# Patient Record
Sex: Female | Born: 1990 | Race: White | Hispanic: No | Smoking: Current every day smoker
Health system: Southern US, Community
[De-identification: ages and names within clinical notes are randomized; demographics above are authoritative.]

## PROBLEM LIST (undated history)

## (undated) DIAGNOSIS — F411 Generalized anxiety disorder: Secondary | ICD-10-CM

## (undated) DIAGNOSIS — F1911 Other psychoactive substance abuse, in remission: Secondary | ICD-10-CM

## (undated) DIAGNOSIS — F4310 Post-traumatic stress disorder, unspecified: Secondary | ICD-10-CM

## (undated) DIAGNOSIS — J40 Bronchitis, not specified as acute or chronic: Secondary | ICD-10-CM

## (undated) DIAGNOSIS — K279 Peptic ulcer, site unspecified, unspecified as acute or chronic, without hemorrhage or perforation: Secondary | ICD-10-CM

## (undated) DIAGNOSIS — F1910 Other psychoactive substance abuse, uncomplicated: Secondary | ICD-10-CM

## (undated) DIAGNOSIS — R768 Other specified abnormal immunological findings in serum: Secondary | ICD-10-CM

## (undated) DIAGNOSIS — F319 Bipolar disorder, unspecified: Secondary | ICD-10-CM

## (undated) DIAGNOSIS — F429 Obsessive-compulsive disorder, unspecified: Secondary | ICD-10-CM

## (undated) DIAGNOSIS — E039 Hypothyroidism, unspecified: Secondary | ICD-10-CM

## (undated) HISTORY — PX: WISDOM TOOTH EXTRACTION: SHX21

## (undated) HISTORY — PX: OTHER SURGICAL HISTORY: SHX169

---
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
## 2008-09-22 ENCOUNTER — Emergency Department (HOSPITAL_COMMUNITY): Admission: EM | Admit: 2008-09-22 | Discharge: 2008-09-22 | Payer: Self-pay | Admitting: Emergency Medicine

## 2009-11-13 ENCOUNTER — Emergency Department (HOSPITAL_COMMUNITY): Admission: EM | Admit: 2009-11-13 | Discharge: 2009-11-13 | Payer: Self-pay | Admitting: Emergency Medicine

## 2009-12-08 ENCOUNTER — Emergency Department (HOSPITAL_COMMUNITY): Admission: EM | Admit: 2009-12-08 | Discharge: 2009-12-08 | Payer: Self-pay | Admitting: Emergency Medicine

## 2009-12-12 ENCOUNTER — Encounter: Payer: Self-pay | Admitting: Orthopedic Surgery

## 2010-06-06 ENCOUNTER — Encounter: Payer: Self-pay | Admitting: Gastroenterology

## 2010-06-15 NOTE — Letter (Signed)
Summary: *Orthopedic No Show Letter  [facility]  [address]  [place], [place] [address]   Phone: [phone]  Fax: [phone]     12/12/2009    [place] [address] [place]. [place], [place]  [address]    Dear Ms. Jani Files,   Our records indicate that you missed your scheduled appointment with Dr. Beaulah Corin on 12/09/2009.   Please contact this office to reschedule your appointment as soon as possible.  It is important that you keep your scheduled appointments with your physician, so we can provide you the best care possible.        Sincerely,   Dr. Terrance Mass, MD [facility] Phone [phone]

## 2010-08-02 ENCOUNTER — Inpatient Hospital Stay (HOSPITAL_COMMUNITY)
Admission: AD | Admit: 2010-08-02 | Discharge: 2010-08-04 | DRG: 759 | Disposition: A | Discharge status: Home / Self Care | Payer: 59 | Source: Ambulatory Visit | Attending: Obstetrics and Gynecology | Admitting: Obstetrics and Gynecology

## 2010-08-02 DIAGNOSIS — N739 Female pelvic inflammatory disease, unspecified: Principal | ICD-10-CM | POA: Diagnosis present

## 2010-08-02 DIAGNOSIS — N949 Unspecified condition associated with female genital organs and menstrual cycle: Secondary | ICD-10-CM | POA: Diagnosis present

## 2010-08-02 LAB — CBC
Hemoglobin: 13.2 g/dL (ref 12.0–15.0)
MCHC: 34.3 g/dL (ref 30.0–36.0)
MCV: 90.4 fL (ref 78.0–100.0)
RBC: 4.26 MIL/uL (ref 3.87–5.11)
WBC: 12.9 10*3/uL — ABNORMAL HIGH (ref 4.0–10.5)

## 2010-08-02 LAB — DIFFERENTIAL
Basophils Absolute: 0 10*3/uL (ref 0.0–0.1)
Basophils Relative: 0 % (ref 0–1)
Lymphocytes Relative: 14 % (ref 12–46)
Monocytes Relative: 11 % (ref 3–12)
Neutro Abs: 9.5 10*3/uL — ABNORMAL HIGH (ref 1.7–7.7)

## 2010-08-03 ENCOUNTER — Inpatient Hospital Stay (HOSPITAL_COMMUNITY): Payer: 59

## 2010-08-24 LAB — CBC
HCT: 38 % (ref 36.0–46.0)
Hemoglobin: 13.2 g/dL (ref 12.0–15.0)
MCHC: 34.8 g/dL (ref 30.0–36.0)
MCV: 90.6 fL (ref 78.0–100.0)
Platelets: 228 10*3/uL (ref 150–400)
RBC: 4.19 MIL/uL (ref 3.87–5.11)
RDW: 13.1 % (ref 11.5–15.5)

## 2010-08-24 LAB — COMPREHENSIVE METABOLIC PANEL
AST: 16 U/L (ref 0–37)
Albumin: 3.9 g/dL (ref 3.5–5.2)
BUN: 8 mg/dL (ref 6–23)
Chloride: 108 mEq/L (ref 96–112)
GFR calc non Af Amer: >60 mL/min (ref >60)
Glucose, Bld: 85 mg/dL (ref 70–99)
Sodium: 139 mEq/L (ref 135–145)
Total Bilirubin: 0.3 mg/dL (ref 0.3–1.2)

## 2010-08-24 LAB — DIFFERENTIAL
Basophils Absolute: 0 10*3/uL (ref 0.0–0.1)
Eosinophils Absolute: 0.3 10*3/uL (ref 0.0–0.7)
Neutro Abs: 2.5 10*3/uL (ref 1.7–7.7)
Neutrophils Relative %: 41 % — ABNORMAL LOW (ref 43–77)

## 2010-08-24 LAB — LIPASE, BLOOD: Lipase: 18 U/L (ref 11–59)

## 2010-08-24 LAB — URINALYSIS, ROUTINE W REFLEX MICROSCOPIC: Ketones, ur: NEGATIVE mg/dL

## 2010-09-01 NOTE — H&P (Signed)
[name], [name] NO.:  [id number]  MEDICAL RECORD NO.:  [id number]        PATIENT TYPE:  WINP  LOCATION:  304                           [facility]ILITY:  [facility]  PHYSICIAN:  Becky [name], M.D.   DATE OF BIRTH:  [birth year]  DATE OF ADMISSION:  08/02/2010 DATE OF DISCHARGE:                             HISTORY & PHYSICAL   CHIEF COMPLAINT:  Pelvic pain.  HISTORY OF PRESENT ILLNESS:  The patient is a 20 year old gravida 1, para 0-0-1-0 Caucasian female who presents to the office for repeat gonorrhea and Chlamydia testing.  The patient was recently seen in the office on July 14, 2010 for her annual examination and followup of low-grade dysplasia, at which time she was noted to have an atypical Pap smear with positive high-risk HPV and a Chlamydia test, which was read out as positive, but indeterminate.  There was a recommendation for recollection and testing as this Chlamydia and gonorrhea testing was added after the initial Pap smear had been processed.  The laboratory recommended recollection.  The patient now presents today reporting lower pelvic pain, which is preventing sleep.  The patient is having pain with coughing.  The pain has been active for the last 4-5 days. The patient reports bloating and swelling of the lower abdomen.  She is not responding to ibuprofen, Aleve and a heating pad.  She denies fever or unusual discharge.  PAST GYNECOLOGIC HISTORY:  1.  Cervical dysplasia.  The patient is status post colposcopy. 2.  Recent diagnosis of the unicornuate uterus. 4. Status post VIP. 5. Irregular menstruation.  Last menstrual period July 15, 2010. 6. Desire for pregnancy.  PAST MEDICAL HISTORY: 1. Tobacco use.  The patient smokes one pack per day. 2. Peptic ulcer disease.  The patient is status post several     endoscopies per her report.  She is not on any medication for     peptic ulcer disease.  PAST SURGICAL HISTORY:  Status post  VIP  MEDICATIONS:  Ibuprofen and Aleve.  ALLERGIES:  No known drug allergies.  SOCIAL HISTORY:  The patient is not married.  She is in a steady relationship.  She smokes 1 pack per day.  She denies use of alcohol or illicit drugs.  REVIEW OF SYSTEMS:  Please refer to history of present illness.  PHYSICAL EXAMINATION:  VITAL SIGNS:  Temperature 100.4 degrees Fahrenheit, blood pressure 100/70, pulse 140, height 5 feet 2 inches, weight 124 pounds. GENERAL:  The patient looks uncomfortable and is hunching over her abdomen. LUNGS:  Scattered wheezes are heard throughout. HEART:  S1 and S2 tachycardia.  There is no murmur, rub or gallop. ABDOMEN:  Appears to be soft.  There is tenderness on palpation of the bilateral lower abdomen. PELVIC:  Normal external genitalia and urethra.  The cervix is without lesions.  The patient is demonstrating discomfort with touching the cervix with a Q-tip.  Bimanual examination demonstrates cervical motion tenderness, uterine tenderness, bilateral adnexal tenderness.  No masses are appreciated.  LABORATORY DATA:  Office urinalysis; dips positive for white and red blood cells.  The microscopic exam demonstrated 0-5 WBCs, 0-5 RBCs, and a few  clue cells  .  There was 0-5 epithelial cells.  IMPRESSION:  The patient is a 20 year old female with pelvic inflammatory disease and a recent Chlamydia test, which is probably a true positive.  The patient has unicornuate uterus.  She desires future fertility.  PLAN:  The patient will be admitted to the [facility] for inpatient treatment of pelvic inflammatory disease.  The patient will first have a urine pregnancy test prior to determination of proper antibiotics.  The patient was unable to complete a urine pregnancy test in the office as the specimen had already been discarded.  A CBC with differential and basic metabolic profile will also be ordered.     Becky [name],  M.D.     BES/MEDQ  D:  08/02/2010  T:  08/02/2010  Job:  [id number]  Electronically Signed by Conley Simmonds M.D. on 09/01/2010 12:16:03 PM

## 2010-09-12 NOTE — Discharge Summary (Signed)
  [name], [name]            ACCOUNT NO.:  [id number]  MEDICAL RECORD NO.:  [id number]           PATIENT TYPE:  I  LOCATION:  9304                          [facility]:  [facility]  PHYSICIAN:  Malva Limes, M.D.    DATE OF BIRTH:  Oct 27, 1990  DATE OF ADMISSION:  08/02/2010 DATE OF DISCHARGE:  08/04/2010                              DISCHARGE SUMMARY   PRINCIPAL DISCHARGE DIAGNOSIS:  Pelvic inflammatory disease.  PRINCIPAL PROCEDURE:  Antibiotic therapy.  HISTORY OF PRESENT ILLNESS:  Becky [name] is a 20 year old white female who was seen in the office on August 02, 2010 complaining of lower abdominal pain.  By exam and history, she was felt to have severe PID.  The patient states that the pain began approximately 1 week prior to admission.  She did have a gonorrhea and Chlamydia test performed approximately 1 week prior to admission.  The Chlamydia test was equivocal and the patient was brought into the office on August 02, 2010, for repeat.  At that time, she was noted to have significant lower abdominal pain and was admitted for ultrasound and  IV antibiotic therapy.  HOSPITAL COURSE:  The patient was admitted, began on cefotetan 2 g q.12 h. and doxycycline 100 mg p.o. b.i.d.  The patient did have a pelvic ultrasound, which revealed no evidence of a tubo-ovarian abscess or pelvic mass.  The patient received 2 days of antibiotics, at which time, she was feeling much better.  She was discharged to home on August 04, 2010.  She was instructed to take 100 mg of doxycycline p.o. b.i.d. for 14 days and 500 mg of Flagyl p.o. b.i.d. for 14 days.  She was also sent with Vicodin.  She was instructed to follow up in the office in 2 weeks.          ______________________________ Malva Limes, M.D.     MA/MEDQ  D:  09/08/2010  T:  09/08/2010  Job:  [id number]  Electronically Signed by Malva Limes M.D. on 09/12/2010 09:00:09 PM

## 2010-09-18 ENCOUNTER — Emergency Department (HOSPITAL_COMMUNITY)
Admission: EM | Admit: 2010-09-18 | Discharge: 2010-09-18 | Disposition: A | Discharge status: Home / Self Care | Payer: 59 | Attending: Emergency Medicine | Admitting: Emergency Medicine

## 2010-09-18 DIAGNOSIS — A568 Sexually transmitted chlamydial infection of other sites: Secondary | ICD-10-CM | POA: Insufficient documentation

## 2010-10-01 NOTE — Discharge Summary (Signed)
Becky [name], Becky [name]                      ACCOUNT NO.:  [id number]   MEDICAL RECORD NO.:  [id number]                   PATIENT TYPE:  INP   LOCATION:  6123                                 FACILITY:  [facility]   PHYSICIAN:  Henrietta Hoover, MD                 DATE OF BIRTH:  09-Oct-1990   DATE OF ADMISSION:  10/26/2003  DATE OF DISCHARGE:  10/27/2003                                 DISCHARGE SUMMARY   DISCHARGE DIAGNOSES:  1. Ectasy overdose.  2. Hyponatremia.  3. Altered mental status.   DISCHARGE MEDICATIONS:  No new medications.  The patient is to resume oral  contraceptives.   DISPOSITION:  Patient discharged to home with parents.   FOLLOWUP:  1. Charlesetta Shanks, M.D., at [facility] on November 06, 2003, at 8:15 a.m.  2. Patient to arrange outpatient counseling for the patient.   CONSULTS:  Pediatric ICU.   PROCEDURES:  None.   BRIEF ADMISSION HISTORY:  This is a 20 year old white female who was brought  to the ER by EMS after being at a friend's home and beginning to have  altered mental status with nausea and vomiting.  It was reported that the  patient took approximately one ectasy pill around 11 p.m. and another around  2 a.m. the morning of presentation.  She was vomiting and dry heaving around  3:30-4:30 a.m.  The patient was drinking a water over this time.  Mildly  diaphoretic.  She was dancing and jumping around.  She tried to take  something to eat about 5-5:30 a.m., but had nausea and vomiting again.  She  fell asleep around 8:30 a.m. and woke up at approximately 10:30 a.m. the  morning of admission.  It was as she could not breathe and she was turning  blue around the mouth.  At this point, her friends were worried and took her  over to her parents' home, who called EMS.  She was described to be groggy,  mumbling things, and would open her eyes to verbal stimuli.   PHYSICAL EXAMINATION:  VITAL SIGNS:  Admission vitals showed a  temperature  of 98.8 degrees, a respiratory rate of 18, a heart rate of 95, a blood  pressure of 111/51, and O2 saturations of 98% on room air.  GENERAL APPEARANCE:  She was uncooperative during exam.  Pupils were equal,  round, reactive, approximately 3 mm in size, and not sluggish to react.  Only dried blood on the tip of the tongue noted.  HEART:  The heart rate was 80s-90s.  Heart tones were distant.  No murmur.  Peripheral pulses 2+.  NEUROLOGIC:  Her Glasgow coma scale was approximately 11.   ADMISSION LABORATORIES:  Urine drug screen positive for amphetamines and  THC.  The white blood cell count was 19.2, hemoglobin 12.8, hematocrit 36.3,  and platelets 335.  Urine pregnancy test was negative.  She had  89%  neutrophils and 7% lymphocytes.  Her initial CK was 2324.  [facility]  laboratories showed a sodium of 120, a potassium of 3.3, a chloride of 91, a  bicarbonate of 19.3, a BUN of 3, a creatinine of 0.6, and a pH of 7.38.  Ketones were greater than 80 on urinalysis.  Also with large hemoglobin and  11-20 red blood cells.   HOSPITAL COURSE:  #1 - ALTERED MENTAL STATUS:  This was presumed to be  secondary to the ectasy overdose.  The patient was monitored on telemetry  overnight.  She had no signs of cardiac involvement with no tachycardia.  She also had a 12-lead EKG that was done that showed no ST-T wave changes  with sinus rhythm and normal axis.  Altered mental status was thought to be  secondary to a combination of the overdose and hyponatremia.   #2 - HYPONATREMIA:  The patient was given a 1 L normal saline bolus and the  sodium went from 120 to 121.  At that time, she was given a 0.3% sodium  chloride solution at a rate of approximately 70 mL/hr which was calculated  to correct her sodium up to 125.  However, the patient's sodium went from  121 on October 26, 2003, at 1313 hours to 120 on October 26, 2003, at 1545 hours  with normal saline.  Then with the 3% saline solution, the  patient went to a  sodium of 131.  On October 26, 2003, at 2020 hours, she was changed over to  normal saline.  At that point, the patient went from 131 to 140 on October 27, 2003, at 0400 hours.  She was then changed to maintenance IV fluids at D5  1/4 normal saline at 40 mL/hr.  Altered mental status resolved the morning  after admission.  The patient was alert and oriented x 3.  No focal deficits  noted.   #3 - PULMONARY:  She had no signs of respiratory distress.  She maintained  her own airway with O2 saturations greater than 95% during hospitalization.   #4 - RHABDOMYOLYSIS:  The initial CK again was 2324.  She had a small bump  on her next laboratory at 2693.  On the morning of discharge, it was 2188  and believed to be resolving with IV fluids and hydration.   #5 - HEMATURIA:  Felt to be secondary to the rhabdomyolysis.  The patient  had very good urine output during hospitalization.  UA can be rechecked  again as an outpatient.   #6 - SUBSTANCE ABUSE:  Child psychology was consulted.  It was felt that  this was an initial encounter with substance as she was at a party in which  other of her peers were trying these things and this was her first attempt  on trying ectasy, however, an unfortunate event at this time.  The parents  are taking this very seriously.  She had age-appropriate discipline.  The  patient will be set up with outpatient child psychology.   Overall the patient looks very well and is stable for discharge.  She had no  medication complications from her overdose.  Altered mental status resolved.  Dehydration resolved.  The patient did have a minor bump in her AST on  admission with AST being 51 and ALT 21.  This was not repeated prior to  discharge.  This can be followed up as well as an outpatient.      Anastasio Auerbach, MD  Henrietta Hoover, MD   AD/MEDQ  D:  10/27/2003  T:  10/27/2003  Job:  [id number]   cc:   [facility]

## 2010-11-03 ENCOUNTER — Emergency Department (HOSPITAL_COMMUNITY)
Admission: EM | Admit: 2010-11-03 | Discharge: 2010-11-03 | Disposition: A | Discharge status: Home / Self Care | Payer: 59 | Attending: Emergency Medicine | Admitting: Emergency Medicine

## 2010-11-03 DIAGNOSIS — J40 Bronchitis, not specified as acute or chronic: Secondary | ICD-10-CM | POA: Insufficient documentation

## 2010-11-03 DIAGNOSIS — F17200 Nicotine dependence, unspecified, uncomplicated: Secondary | ICD-10-CM | POA: Insufficient documentation

## 2011-02-03 LAB — COMPREHENSIVE METABOLIC PANEL
ALT: 15
Albumin: 3.5
Alkaline Phosphatase: 46
Chloride: 105
Creatinine, Ser: 0.93
GFR calc Af Amer: >60
Total Bilirubin: 0.8

## 2011-02-03 LAB — CBC
HCT: 40.9
MCHC: 34.4
Platelets: 369
RBC: 4.55
WBC: 21.9 — ABNORMAL HIGH

## 2011-02-03 LAB — DIFFERENTIAL
Basophils Absolute: 0
Lymphs Abs: 2.1
Neutrophils Relative %: 89 — ABNORMAL HIGH

## 2011-02-03 LAB — AMYLASE: Amylase: 77

## 2011-02-03 LAB — URINALYSIS, ROUTINE W REFLEX MICROSCOPIC
Bilirubin Urine: NEGATIVE
Ketones, ur: NEGATIVE
Nitrite: NEGATIVE
Protein, ur: NEGATIVE
Specific Gravity, Urine: 1.015
pH: 6.5

## 2011-02-03 LAB — POCT PREGNANCY, URINE
Operator id: 234501
Preg Test, Ur: NEGATIVE

## 2011-02-03 LAB — CULTURE, BLOOD (ROUTINE X 2): Culture: NO GROWTH

## 2011-02-03 LAB — RAPID STREP SCREEN (MED CTR MEBANE ONLY): Streptococcus, Group A Screen (Direct): NEGATIVE

## 2011-07-08 ENCOUNTER — Ambulatory Visit: Payer: 59 | Admitting: Internal Medicine

## 2011-07-08 VITALS — BP 99/62 | HR 81 | Temp 98.4°F | Resp 16 | Ht 62.5 in | Wt 114.0 lb

## 2011-07-08 DIAGNOSIS — N949 Unspecified condition associated with female genital organs and menstrual cycle: Secondary | ICD-10-CM

## 2011-07-08 DIAGNOSIS — N12 Tubulo-interstitial nephritis, not specified as acute or chronic: Secondary | ICD-10-CM

## 2011-07-08 DIAGNOSIS — R509 Fever, unspecified: Secondary | ICD-10-CM

## 2011-07-08 DIAGNOSIS — R300 Dysuria: Secondary | ICD-10-CM

## 2011-07-08 DIAGNOSIS — R102 Pelvic and perineal pain: Secondary | ICD-10-CM

## 2011-07-08 LAB — POCT URINE PREGNANCY: Preg Test, Ur: NEGATIVE

## 2011-07-08 LAB — RPR

## 2011-07-08 LAB — POCT URINALYSIS DIPSTICK
Nitrite, UA: POSITIVE
Protein, UA: 30
Urobilinogen, UA: 1
pH, UA: 6

## 2011-07-08 LAB — POCT CBC
HCT, POC: 40.2 % (ref 37.7–47.9)
Hemoglobin: 12.8 g/dL (ref 12.2–16.2)
Lymph, poc: 3.9 — AB (ref 0.6–3.4)
MCH, POC: 29.4 pg (ref 27–31.2)
MCHC: 31.8 g/dL (ref 31.8–35.4)
POC Granulocyte: 8.1 — AB (ref 2–6.9)
POC LYMPH PERCENT: 29.8 %L (ref 10–50)
POC MID %: 7.7 %M (ref 0–12)
RDW, POC: 14.3 %
WBC: 13 10*3/uL — AB (ref 4.6–10.2)

## 2011-07-08 LAB — POCT WET PREP WITH KOH
KOH Prep POC: NEGATIVE
Trichomonas, UA: NEGATIVE
Yeast Wet Prep HPF POC: NEGATIVE

## 2011-07-08 LAB — POCT UA - MICROSCOPIC ONLY
Casts, Ur, LPF, POC: NEGATIVE
Crystals, Ur, HPF, POC: NEGATIVE

## 2011-07-08 LAB — HEPATITIS C ANTIBODY: HCV Ab: NEGATIVE

## 2011-07-08 LAB — HEPATITIS B SURFACE ANTIBODY, QUANTITATIVE: Hepatitis B-Post: 13.9 m[IU]/mL

## 2011-07-08 MED ORDER — AZITHROMYCIN 500 MG PO TABS: ORAL_TABLET | ORAL | Status: DC

## 2011-07-08 MED ORDER — CEFTRIAXONE SODIUM 1 G IJ SOLR
1.0000 g | Freq: Once | INTRAMUSCULAR | Status: AC
  Administered 2011-07-08: 1 g via INTRAMUSCULAR

## 2011-07-08 MED ORDER — CEFTRIAXONE SODIUM 1 G IJ SOLR: 1.0000 g | Freq: Once | INTRAMUSCULAR | Status: DC

## 2011-07-08 MED ORDER — KETOROLAC TROMETHAMINE 60 MG/2ML IM SOLN
60.0000 mg | Freq: Once | INTRAMUSCULAR | Status: AC
  Administered 2011-07-08: 60 mg via INTRAMUSCULAR

## 2011-07-08 MED ORDER — CIPROFLOXACIN HCL 500 MG PO TABS: 500.0000 mg | ORAL_TABLET | Freq: Two times a day (BID) | ORAL | Status: AC

## 2011-07-09 NOTE — Progress Notes (Signed)
  Subjective:    Patient ID: Becky [name], female    DOB: Sep 10, 1990, 21 y.o.   MRN: [id number]  HPI21 year old presents with pelvic pain for one week. She also has a vaginal discharge, fever, and night sweats. There has been dysuria and frequency with small void. She had the diagnosis of PID about 5 weeks ago at [facility] ER. There was no clear diagnosis she remembers and her partner was not treated. She thinks she was reinfected with an STD and has PID again. 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 She got started at age 21 with a bad boyfriend. She is now trying to separate from her most recent boyfriend with her and she is to be used substances. Her mother is with her today. The detox center asked that we clear her medically before admission. She is safely active without barrier protection or other form of birth control. Her last intercourse was 8 days ago and her last period was 15 days ago.  She is requesting a full STD screening. She gives no history of IVDU.    Review of Delight Stare has a history of having only one ovary. She had chlamydia several years ago. She has no current other symptoms except for back pain which is part of this illness.    Objective:   Physical ExamShe is obviously uncomfortable although her vital signs are normal The abdomen is soft but has marked tenderness in both lower quadrants. There is no rebound. The vaginal opening shows several HPV lesions at the lower introitus The vault is clear and the os is clear. The uterus is anterior and nontender. The left adnexa is full without a discrete mass and is very tender. Right adnexa has no mass or tenderness There is bilateral CVA tenderness to percussion   UA has positive nitrites with 20-30 white blood cells CBC has elevated white blood cells Pregnancy test is negative Wet prep is negative       Assessment & Plan:  Problem #1 acute pyelonephritis Given 1 g of Rocephin to be followed by Cipro 500 twice a day for 10 days The urine is cultured  Problem #2 possible pelvic inflammatory disease Zithromax 1 g now and 1 g in 7 days will be added to her current coverage Gen-Probe is sent  Problem #3 STD screening Additional studies sent  She is discharged with mom for direct admission to [facility] She will followup upon discharge for recheck of her pelvic anatomy and possible pelvic ultrasound

## 2011-07-11 LAB — URINE CULTURE: Colony Count: >=100000

## 2011-11-10 ENCOUNTER — Encounter (HOSPITAL_COMMUNITY): Payer: Self-pay | Admitting: *Deleted

## 2011-11-10 ENCOUNTER — Emergency Department (HOSPITAL_COMMUNITY)
Admission: EM | Admit: 2011-11-10 | Discharge: 2011-11-10 | Disposition: A | Discharge status: Home / Self Care | Payer: 59 | Attending: Emergency Medicine | Admitting: Emergency Medicine

## 2011-11-10 DIAGNOSIS — K0889 Other specified disorders of teeth and supporting structures: Secondary | ICD-10-CM

## 2011-11-10 DIAGNOSIS — R220 Localized swelling, mass and lump, head: Secondary | ICD-10-CM | POA: Insufficient documentation

## 2011-11-10 DIAGNOSIS — K029 Dental caries, unspecified: Secondary | ICD-10-CM | POA: Insufficient documentation

## 2011-11-10 MED ORDER — AMOXICILLIN 500 MG PO CAPS: 500.0000 mg | ORAL_CAPSULE | Freq: Three times a day (TID) | ORAL | Status: AC

## 2011-11-10 MED ORDER — HYDROCODONE-ACETAMINOPHEN 5-325 MG PO TABS: ORAL_TABLET | ORAL | Status: DC

## 2011-11-10 NOTE — ED Provider Notes (Signed)
History     CSN: [id number]  Arrival date & time 11/10/11  1527   First MD Initiated Contact with Patient 11/10/11 1639      Chief Complaint  Patient presents with  . Dental Pain    (Consider location/radiation/quality/duration/timing/severity/associated sxs/prior treatment) Patient is a 21 y.o. female presenting with tooth pain. The history is provided by the patient.  Dental PainThe primary symptoms include mouth pain. Primary symptoms do not include dental injury, oral bleeding, headaches, fever, shortness of breath, sore throat, angioedema or cough. The symptoms began yesterday. The symptoms are worsening. The symptoms are new. The symptoms occur constantly.  Mouth pain began 24 -48 hours ago. Mouth pain occurs constantly. Mouth pain is worsening. Affected locations include: teeth and gum(s).  Additional symptoms include: dental sensitivity to temperature, gum tenderness and facial swelling. Additional symptoms do not include: gum swelling, trismus, trouble swallowing, pain with swallowing, excessive salivation and ear pain. Medical issues include: smoking and periodontal disease.    History reviewed. No pertinent past medical history.  History reviewed. No pertinent past surgical history.  History reviewed. No pertinent family history.  History  Substance Use Topics  . Smoking status: Current Everyday Smoker -- 1.0 packs/day    Types: Cigarettes  . Smokeless tobacco: Not on file  . Alcohol Use: No    OB History    Grav Para Term Preterm Abortions TAB SAB Ect Mult Living                  Review of Systems  Constitutional: Negative for fever and appetite change.  HENT: Positive for facial swelling and dental problem. Negative for ear pain, congestion, sore throat, trouble swallowing, neck pain and neck stiffness.   Eyes: Negative for pain and visual disturbance.  Respiratory: Negative for cough and shortness of breath.   Neurological: Negative for dizziness, facial  asymmetry and headaches.  Hematological: Negative for adenopathy.  All other systems reviewed and are negative.    Allergies  Review of patient's allergies indicates no known allergies.  Home Medications   Current Outpatient Rx  Name Route Sig Dispense Refill  . AZITHROMYCIN 500 MG PO TABS  2 tabs at one time tonight and 2 tabs repeated at one dose in 7 days 4 tablet 0    BP 132/82  Pulse 92  Temp 98 F (36.7 C) (Oral)  Resp 17  Ht 5\' 2"  (1.575 m)  Wt 114 lb (51.71 kg)  BMI 20.85 kg/m2  SpO2 99%  LMP 10/12/2011  Physical Exam  Nursing note and vitals reviewed. Constitutional: She is oriented to person, place, and time. She appears well-developed and well-nourished. No distress.  HENT:  Head: Normocephalic and atraumatic. No trismus in the jaw.  Mouth/Throat: Uvula is midline, oropharynx is clear and moist and mucous membranes are normal. Dental caries present. No dental abscesses or uvula swelling.         ttp of the right upper third molar and left upper second molar.  Multiple dental caries.    Neck: Normal range of motion. Neck supple.  Cardiovascular: Normal rate, regular rhythm and normal heart sounds.   No murmur heard. Pulmonary/Chest: Effort normal and breath sounds normal.  Musculoskeletal: Normal range of motion.  Lymphadenopathy:    She has no cervical adenopathy.  Neurological: She is alert and oriented to person, place, and time. She exhibits normal muscle tone. Coordination normal.  Skin: Skin is warm and dry.    ED Course  Procedures (including critical care time)  Labs Reviewed - No data to display      MDM    Multiple dental caries.  ttp of the right upper second molar and left upper third molar.  No obvious abscess.  Mild facial swelling to the left cheek.  No trismus   Patient / Family / Caregiver understand and agree with initial ED impression and plan with expectations set for ED visit. Pt stable in ED with no significant  deterioration in condition. Pt feels improved after observation and/or treatment in ED.     Prescribed:  Amoxil norco #15  Linnet Bottari L. Shelisa Fern, [place] 11/10/11 1709

## 2011-11-10 NOTE — ED Notes (Signed)
Pt c/o pain in her right and left upper teeth since yesterday. States that she is not able to see her dentist until next week.

## 2011-11-10 NOTE — Discharge Instructions (Signed)
Dental Pain  A tooth ache may be caused by cavities (tooth decay). Cavities expose the nerve of the tooth to air and hot or cold temperatures. It may come from an infection or abscess (also called a boil or furuncle) around your tooth. It is also often caused by dental caries (tooth decay). This causes the pain you are having.  DIAGNOSIS   Your caregiver can diagnose this problem by exam.  TREATMENT   · If caused by an infection, it may be treated with medications which kill germs (antibiotics) and pain medications as prescribed by your caregiver. Take medications as directed.  · Only take over-the-counter or prescription medicines for pain, discomfort, or fever as directed by your caregiver.  · Whether the tooth ache today is caused by infection or dental disease, you should see your dentist as soon as possible for further care.  SEEK MEDICAL CARE IF:  The exam and treatment you received today has been provided on an emergency basis only. This is not a substitute for complete medical or dental care. If your problem worsens or new problems (symptoms) appear, and you are unable to meet with your dentist, call or return to this location.  SEEK IMMEDIATE MEDICAL CARE IF:   · You have a fever.  · You develop redness and swelling of your face, jaw, or neck.  · You are unable to open your mouth.  · You have severe pain uncontrolled by pain medicine.  MAKE SURE YOU:   · Understand these instructions.  · Will watch your condition.  · Will get help right away if you are not doing well or get worse.  Document Released: 05/02/2005 Document Revised: 04/21/2011 Document Reviewed: 12/19/2007  [employer]® Patient Information ©2012 [employer].

## 2011-11-11 NOTE — ED Provider Notes (Signed)
Medical screening examination/treatment/procedure(s) were performed by non-physician practitioner and as supervising physician I was immediately available for consultation/collaboration.   Rosamary Boudreau, MD 11/11/11 0011 

## 2011-11-17 ENCOUNTER — Emergency Department (HOSPITAL_COMMUNITY)
Admission: EM | Admit: 2011-11-17 | Discharge: 2011-11-17 | Disposition: A | Discharge status: Home / Self Care | Payer: 59 | Attending: Emergency Medicine | Admitting: Emergency Medicine

## 2011-11-17 ENCOUNTER — Encounter (HOSPITAL_COMMUNITY): Payer: Self-pay | Admitting: Emergency Medicine

## 2011-11-17 DIAGNOSIS — F17200 Nicotine dependence, unspecified, uncomplicated: Secondary | ICD-10-CM | POA: Insufficient documentation

## 2011-11-17 DIAGNOSIS — N898 Other specified noninflammatory disorders of vagina: Secondary | ICD-10-CM | POA: Insufficient documentation

## 2011-11-17 DIAGNOSIS — N730 Acute parametritis and pelvic cellulitis: Secondary | ICD-10-CM

## 2011-11-17 DIAGNOSIS — R109 Unspecified abdominal pain: Secondary | ICD-10-CM | POA: Insufficient documentation

## 2011-11-17 DIAGNOSIS — B9689 Other specified bacterial agents as the cause of diseases classified elsewhere: Secondary | ICD-10-CM

## 2011-11-17 DIAGNOSIS — B360 Pityriasis versicolor: Secondary | ICD-10-CM

## 2011-11-17 DIAGNOSIS — Z113 Encounter for screening for infections with a predominantly sexual mode of transmission: Secondary | ICD-10-CM | POA: Insufficient documentation

## 2011-11-17 LAB — URINALYSIS, ROUTINE W REFLEX MICROSCOPIC
Glucose, UA: NEGATIVE mg/dL
Hgb urine dipstick: NEGATIVE
Leukocytes, UA: NEGATIVE
Specific Gravity, Urine: 1.02 (ref 1.005–1.030)
pH: 7 (ref 5.0–8.0)

## 2011-11-17 LAB — WET PREP, GENITAL
Trich, Wet Prep: NONE SEEN
Yeast Wet Prep HPF POC: NONE SEEN

## 2011-11-17 LAB — POCT PREGNANCY, URINE: Preg Test, Ur: NEGATIVE

## 2011-11-17 MED ORDER — KETOCONAZOLE 200 MG PO TABS: 200.0000 mg | ORAL_TABLET | Freq: Every day | ORAL | Status: DC

## 2011-11-17 MED ORDER — DOXYCYCLINE HYCLATE 100 MG PO TABS
100.0000 mg | ORAL_TABLET | Freq: Once | ORAL | Status: AC
  Administered 2011-11-17: 100 mg via ORAL
  Filled 2011-11-17: qty 1

## 2011-11-17 MED ORDER — METRONIDAZOLE 500 MG PO TABS: 500.0000 mg | ORAL_TABLET | Freq: Two times a day (BID) | ORAL | Status: AC

## 2011-11-17 MED ORDER — DOXYCYCLINE HYCLATE 100 MG PO CAPS: 100.0000 mg | ORAL_CAPSULE | Freq: Two times a day (BID) | ORAL | Status: AC

## 2011-11-17 MED ORDER — CEFTRIAXONE SODIUM 250 MG IJ SOLR
250.0000 mg | INTRAMUSCULAR | Status: DC
  Administered 2011-11-17: 250 mg via INTRAMUSCULAR
  Filled 2011-11-17: qty 250

## 2011-11-17 MED ORDER — LIDOCAINE HCL (PF) 1 % IJ SOLN
INTRAMUSCULAR | Status: AC
  Administered 2011-11-17: 5 mL via INTRAMUSCULAR
  Filled 2011-11-17: qty 5

## 2011-11-17 NOTE — ED Notes (Signed)
Pt states she "wants a full STD check and a pap smear" Pt informed that pap smears are not performed in the ED. Pt denies symptoms. States lump to left groin area. Pt would also like to have chest checked for rash due "to a fungus from hair follicles"

## 2011-11-17 NOTE — ED Provider Notes (Signed)
History    This chart was scribed for [name], MD, MD by Smitty Pluck. The patient was seen in room APA19 and the patient's care was started at 1:02PM.   CSN: [id number]  Arrival date & time 11/17/11  1238   First MD Initiated Contact with Patient 11/17/11 1258      Chief Complaint  Patient presents with  . SEXUALLY TRANSMITTED DISEASE  . Abdominal Pain    (Consider location/radiation/quality/duration/timing/severity/associated sxs/prior treatment) Patient is a 21 y.o. female presenting with abdominal pain. The history is provided by the patient.  Abdominal Pain The primary symptoms of the illness include abdominal pain.   Becky [name] is a 21 y.o. female who presents to the Emergency Department wanting to STD exam and moderate left suprapubic pain.. Pt reports having sex without condom a couple of months ago. Pt reports vaginal discharge that is yellowish-green and has order. Pain is rated at 7/10. Pt denies alleviating and aggravating factors. Pt reports that after urinating "it feels weird."  Pt reports having lower back pain and moderate headache. Pt reports having hx chlamydia. Pt reports smoking 1.5 pack/day. Pt denies n/v/d, fever, constipation, dysuria. Pt reports symptoms have been constant without radiation.   History reviewed. No pertinent past medical history.  History reviewed. No pertinent past surgical history.  History reviewed. No pertinent family history.  History  Substance Use Topics  . Smoking status: Current Everyday Smoker -- 1.0 packs/day    Types: Cigarettes  . Smokeless tobacco: Not on file  . Alcohol Use: Yes    OB History    Grav Para Term Preterm Abortions TAB SAB Ect Mult Living                  Review of Systems  Gastrointestinal: Positive for abdominal pain.  All other systems reviewed and are negative.  10 Systems reviewed and all are negative for acute change except as noted in the HPI.   Allergies  Review of patient's  allergies indicates no known allergies.  Home Medications   Current Outpatient Rx  Name Route Sig Dispense Refill  . AMOXICILLIN 500 MG PO CAPS Oral Take 1 capsule (500 mg total) by mouth 3 (three) times daily. 30 capsule 0  . HYDROCODONE-ACETAMINOPHEN 5-325 MG PO TABS  Take one-two tabs po q 4-6 hrs prn pain 15 tablet 0    BP 108/64  Pulse 93  Temp 98.3 F (36.8 C) (Oral)  Resp 18  Ht 5' 2.25" (1.581 m)  Wt 117 lb 3 oz (53.156 kg)  BMI 21.26 kg/m2  SpO2 99%  LMP 11/12/2011  Physical Exam  Nursing note and vitals reviewed. Constitutional: She is oriented to person, place, and time. She appears well-developed and well-nourished. No distress.  HENT:  Head: Normocephalic and atraumatic.  Neck: Normal range of motion. Neck supple.  Cardiovascular: Normal rate, regular rhythm and normal heart sounds.   Pulmonary/Chest: Effort normal and breath sounds normal. No respiratory distress.       No breast mass or tenderness.  Abdominal: There is tenderness (mild suprapubic ).  Genitourinary: No vaginal discharge found.       External genitalia shows 2 rings placed and a few scattered condyloma acuminata lesions. Cervix appears normal and is closed. There is mild to moderate tenderness to palpation in the right adnexa without masses felt. There is mild cervical motion tenderness and mild tenderness to palpation over the fundus. Fundus is normal size and position. There is no left adnexal tenderness or  mass.  Neurological: She is alert and oriented to person, place, and time.  Skin: Skin is warm and dry. Rash noted.       Maculopapular rash over the chest with areas of confluence.  Psychiatric: She has a normal mood and affect. Her behavior is normal.    ED Course  Procedures (including critical care time) DIAGNOSTIC STUDIES: Oxygen Saturation is 99% on room air, normal by my interpretation.    COORDINATION OF CARE: 1:10PM EDP discusses pt ED treatment. 1:45PM EDP ordered medication:  rocephin 250 mg, vibra 100 mg, xylocane 1%  Results for orders placed during the hospital encounter of 11/17/11  URINALYSIS, ROUTINE W REFLEX MICROSCOPIC      Component Value Range   Color, Urine YELLOW  YELLOW   APPearance CLEAR  CLEAR   Specific Gravity, Urine 1.020  1.005 - 1.030   pH 7.0  5.0 - 8.0   Glucose, UA NEGATIVE  NEGATIVE mg/dL   Hgb urine dipstick NEGATIVE  NEGATIVE   Bilirubin Urine NEGATIVE  NEGATIVE   Ketones, ur NEGATIVE  NEGATIVE mg/dL   Protein, ur NEGATIVE  NEGATIVE mg/dL   Urobilinogen, UA 0.2  0.0 - 1.0 mg/dL   Nitrite NEGATIVE  NEGATIVE   Leukocytes, UA NEGATIVE  NEGATIVE  POCT PREGNANCY, URINE      Component Value Range   Preg Test, Ur NEGATIVE  NEGATIVE  WET PREP, GENITAL      Component Value Range   Yeast Wet Prep HPF POC NONE SEEN  NONE SEEN   Trich, Wet Prep NONE SEEN  NONE SEEN   Clue Cells Wet Prep HPF POC MANY (*) NONE SEEN   WBC, Wet Prep HPF POC FEW (*) NONE SEEN     1. PID (acute pelvic inflammatory disease)   2. Tinea versicolor   3. Bacterial vaginosis       MDM  Possible exposure to STD. Patient specifically requested a breast exam be done because she is concerned that she was was going to have a problem because she is anticipating piercing her nipples she also asked specifically if there were any other STDs that she could be checked for and she was told that test are being done for all of the STDs that tests are available for in this emergency department. She specifically asked about herpes and she was advised that the test can only be done if lesions are present.  Exam is consistent with mild PID and she will be treated her accordingly. She is also requested a prescription for ketoconazole since that is what usually takes for her rash. The rash seems most consistent with tinea versicolor. Prescriptions will be given doxycycline and ketoconazole.  Wet prep shows evidence of bacterial vaginosis. You also be given a prescription for  metronidazole.  I personally performed the services described in this documentation, which was scribed in my presence. The recorded information has been reviewed and considered.         [name], MD 11/17/11 [phone]

## 2011-11-17 NOTE — ED Notes (Signed)
Pt c/o abd pain and wants to be checked for stds. Pt states she has been having unprotected sex.

## 2011-11-22 NOTE — ED Notes (Signed)
Results received from [facility].  (+) Chlamydia.  Treated with Rocephin and given Rx for Doxycyline.  [facility] form completed and faxed.  Call and notify patient.

## 2011-11-25 NOTE — ED Notes (Signed)
Voice mail message left for patient to return call. 

## 2011-11-26 NOTE — ED Notes (Signed)
Attempted to contact patient. No answer. Left voicemail for patient to call back. Sent letter after no answer x 3. °

## 2011-11-26 NOTE — ED Notes (Signed)
Patient called back and was informed of +result. Stated "[facility]".

## 2012-01-22 ENCOUNTER — Encounter (HOSPITAL_COMMUNITY): Payer: Self-pay | Admitting: *Deleted

## 2012-01-22 ENCOUNTER — Emergency Department (HOSPITAL_COMMUNITY)
Admission: EM | Admit: 2012-01-22 | Discharge: 2012-01-22 | Disposition: A | Discharge status: Home / Self Care | Payer: 59 | Attending: Emergency Medicine | Admitting: Emergency Medicine

## 2012-01-22 DIAGNOSIS — L089 Local infection of the skin and subcutaneous tissue, unspecified: Secondary | ICD-10-CM | POA: Insufficient documentation

## 2012-01-22 DIAGNOSIS — F17200 Nicotine dependence, unspecified, uncomplicated: Secondary | ICD-10-CM | POA: Insufficient documentation

## 2012-01-22 MED ORDER — MUPIROCIN CALCIUM 2 % EX CREA: TOPICAL_CREAM | Freq: Three times a day (TID) | CUTANEOUS | Status: AC

## 2012-01-22 MED ORDER — SULFAMETHOXAZOLE-TRIMETHOPRIM 800-160 MG PO TABS: 1.0000 | ORAL_TABLET | Freq: Two times a day (BID) | ORAL | Status: AC

## 2012-01-22 NOTE — ED Provider Notes (Signed)
Medical screening examination/treatment/procedure(s) were performed by non-physician practitioner and as supervising physician I was immediately available for consultation/collaboration.  Raeford Razor, MD 01/22/12 2045

## 2012-01-22 NOTE — ED Notes (Signed)
Discharge instructions reviewed with pt, questions answered. Pt verbalized understanding.  

## 2012-01-22 NOTE — ED Provider Notes (Signed)
History     CSN: [id number]  Arrival date & time 01/22/12  1722   First MD Initiated Contact with Patient 01/22/12 1738      Chief Complaint  Patient presents with  . Illegal value: [    (Consider location/radiation/quality/duration/timing/severity/associated sxs/prior treatment) HPI Comments: Patient presents to the emergency department with a infected area of the right upper abdomen. She states that the area has been there for over a week. She is unsure of any bites or scratches. Patient states she thinks that she was moving wound. This may have happened but she is unsure she sustained a scratch or she sustained a bite from something. She states that it started as a small pimple and now is about the size of a nickel. She's complains of itching and at times burning at the site. She has not had fever, chills, nausea, or vomiting.  The history is provided by the patient.    History reviewed. No pertinent past medical history.  History reviewed. No pertinent past surgical history.  No family history on file.  History  Substance Use Topics  . Smoking status: Current Everyday Smoker -- 1.0 packs/day    Types: Cigarettes  . Smokeless tobacco: Not on file  . Alcohol Use: Yes    OB History    Grav Para Term Preterm Abortions TAB SAB Ect Mult Living                  Review of Systems  Constitutional: Negative for activity change.       All ROS Neg except as noted in HPI  HENT: Negative for nosebleeds and neck pain.   Eyes: Negative for photophobia and discharge.  Respiratory: Negative for cough, shortness of breath and wheezing.   Cardiovascular: Negative for chest pain and palpitations.  Gastrointestinal: Negative for abdominal pain and blood in stool.  Genitourinary: Negative for dysuria, frequency and hematuria.  Musculoskeletal: Negative for back pain and arthralgias.  Skin: Positive for rash.  Neurological: Negative for dizziness, seizures and speech difficulty.    Psychiatric/Behavioral: Negative for hallucinations and confusion.    Allergies  Review of patient's allergies indicates no known allergies.  Home Medications   Current Outpatient Rx  Name Route Sig Dispense Refill  . KETOCONAZOLE 200 MG PO TABS Oral Take 1 tablet (200 mg total) by mouth daily. 28 tablet 0  . MUPIROCIN CALCIUM 2 % EX CREA Topical Apply topically 3 (three) times daily. 15 g 0  . NAPROXEN SODIUM 220 MG PO TABS Oral Take 220 mg by mouth 2 (two) times daily as needed. Pain    . SULFAMETHOXAZOLE-TRIMETHOPRIM 800-160 MG PO TABS Oral Take 1 tablet by mouth 2 (two) times daily. 14 tablet 0    Please take with food    BP 139/84  Pulse 89  Temp 98.4 F (36.9 C)  Resp 18  Ht 5\' 2"  (1.575 m)  Wt 116 lb (52.617 kg)  BMI 21.22 kg/m2  SpO2 100%  LMP 01/02/2012  Physical Exam  Nursing note and vitals reviewed. Constitutional: She is oriented to person, place, and time. She appears well-developed and well-nourished.  Non-toxic appearance.  HENT:  Head: Normocephalic.  Right Ear: Tympanic membrane and external ear normal.  Left Ear: Tympanic membrane and external ear normal.  Eyes: EOM and lids are normal. Pupils are equal, round, and reactive to light.  Neck: Normal range of motion. Neck supple. Carotid bruit is not present.  Cardiovascular: Normal rate, regular rhythm, normal heart sounds, intact distal  pulses and normal pulses.   Pulmonary/Chest: Breath sounds normal. No respiratory distress.  Abdominal: Soft. Bowel sounds are normal. There is no tenderness. There is no guarding.       There is a 1 x 1.4 cm area of the right upper abdomen. Under magnification there are 2 very small pus filled areas. There there are scab-like area at the site. The area is warm but not hot. There is no red streaking. There is no drainage from the area.  Musculoskeletal: Normal range of motion.  Lymphadenopathy:       Head (right side): No submandibular adenopathy present.       Head  (left side): No submandibular adenopathy present.    She has no cervical adenopathy.  Neurological: She is alert and oriented to person, place, and time. She has normal strength. No cranial nerve deficit or sensory deficit.  Skin: Skin is warm and dry.  Psychiatric: She has a normal mood and affect. Her speech is normal.    ED Course  Procedures (including critical care time)  Labs Reviewed - No data to display No results found. Pulse oximetry 100% on room air. Within normal limits by my interpretation.  1. Skin infection       MDM  I have reviewed nursing notes, vital signs, and all appropriate lab and imaging results for this patient. Patient has a skin infection with possible early abscess of the right upper abdomen. There is no red streaking. There is no drainage. There is no fever. The plan at this time is for the patient to do warm salt water tub soaks daily. To apply Bactroban 2% 3 times daily, and to use Septra DS one twice a day with food. The patient is to see her primary physician or return to the emergency department if any changes or concerns.       Kathie Dike, [place] 01/22/12 Flossie Buffy

## 2012-01-22 NOTE — ED Notes (Addendum)
Pt c/o ?abscess area to upper abd area that started about a week ago, denies any drainage, states that the area itches.

## 2012-02-18 ENCOUNTER — Emergency Department (HOSPITAL_COMMUNITY)
Admission: EM | Admit: 2012-02-18 | Discharge: 2012-02-18 | Disposition: A | Discharge status: Home / Self Care | Payer: 59 | Attending: Emergency Medicine | Admitting: Emergency Medicine

## 2012-02-18 ENCOUNTER — Encounter (HOSPITAL_COMMUNITY): Payer: Self-pay

## 2012-02-18 DIAGNOSIS — B851 Pediculosis due to Pediculus humanus corporis: Secondary | ICD-10-CM

## 2012-02-18 DIAGNOSIS — J069 Acute upper respiratory infection, unspecified: Secondary | ICD-10-CM

## 2012-02-18 DIAGNOSIS — B852 Pediculosis, unspecified: Secondary | ICD-10-CM | POA: Insufficient documentation

## 2012-02-18 MED ORDER — PERMETHRIN 5 % EX CREA: TOPICAL_CREAM | CUTANEOUS | Status: DC

## 2012-02-18 MED ORDER — PROMETHAZINE-DM 6.25-15 MG/5ML PO SYRP: 5.0000 mL | ORAL_SOLUTION | Freq: Four times a day (QID) | ORAL | Status: DC

## 2012-02-18 MED ORDER — PREDNISONE 10 MG PO TABS: ORAL_TABLET | ORAL | Status: DC

## 2012-02-18 MED ORDER — HYDROXYZINE PAMOATE 25 MG PO CAPS: 25.0000 mg | ORAL_CAPSULE | Freq: Three times a day (TID) | ORAL | Status: DC | PRN

## 2012-02-18 NOTE — ED Notes (Signed)
See triage notes

## 2012-02-18 NOTE — ED Notes (Signed)
Pt rash that started on ab area 1 month ago has now spread to body, also sick w/ cough for 2 weeks.

## 2012-02-18 NOTE — ED Provider Notes (Signed)
History     CSN: [id number]  Arrival date & time 02/18/12  1317   First MD Initiated Contact with Patient 02/18/12 1427      Chief Complaint  Patient presents with  . Rash  . Cough    (Consider location/radiation/quality/duration/timing/severity/associated sxs/prior treatment) Patient is a 21 y.o. female presenting with rash. The history is provided by the patient.  Rash  The current episode started more than 1 week ago. The problem has been gradually worsening. The problem is associated with an unknown factor. There has been no fever. The rash is present on the left hand, right hand, right lower leg and left lower leg (waist). The patient is experiencing no pain (itching). The pain has been constant since onset. Associated symptoms include itching. She has tried antihistamines for the symptoms. The treatment provided no relief.    History reviewed. No pertinent past medical history.  History reviewed. No pertinent past surgical history.  No family history on file.  History  Substance Use Topics  . Smoking status: Current Every Day Smoker -- 1.0 packs/day    Types: Cigarettes  . Smokeless tobacco: Not on file  . Alcohol Use: Yes     occ    OB History    Grav Para Term Preterm Abortions TAB SAB Ect Mult Living                  Review of Systems  Constitutional: Negative for activity change.       All ROS Neg except as noted in HPI  HENT: Negative for nosebleeds and neck pain.   Eyes: Negative for photophobia and discharge.  Respiratory: Positive for cough. Negative for shortness of breath and wheezing.   Cardiovascular: Negative for chest pain and palpitations.  Gastrointestinal: Negative for abdominal pain and blood in stool.  Genitourinary: Negative for dysuria, frequency and hematuria.  Musculoskeletal: Negative for back pain and arthralgias.  Skin: Positive for itching and rash.       itching  Neurological: Negative for dizziness, seizures and speech  difficulty.  Psychiatric/Behavioral: Negative for hallucinations and confusion.    Allergies  Review of patient's allergies indicates no known allergies.  Home Medications   Current Outpatient Rx  Name Route Sig Dispense Refill  . DIPHENHYDRAMINE HCL 50 MG PO CAPS Oral Take 100 mg by mouth every 6 (six) hours as needed. For itching    . NAPROXEN SODIUM 220 MG PO TABS Oral Take 220 mg by mouth 2 (two) times daily as needed. Pain    . HYDROXYZINE PAMOATE 25 MG PO CAPS Oral Take 1 capsule (25 mg total) by mouth 3 (three) times daily as needed for itching. 30 capsule 0  . PERMETHRIN 5 % EX CREA  Apply from neck down, and leave on for 6 hours, then shower off. May repeat in 5 days if needed. 60 g 0  . PREDNISONE 10 MG PO TABS  6,5,4,3,2,1 - take with food 21 tablet 0  . PROMETHAZINE-DM 6.25-15 MG/5ML PO SYRP Oral Take 5 mLs by mouth every 6 (six) hours. 150 mL 0    BP 122/75  Pulse 82  Temp 98.7 F (37.1 C) (Oral)  Resp 18  Ht 5\' 2"  (1.575 m)  Wt 116 lb (52.617 kg)  BMI 21.22 kg/m2  SpO2 99%  LMP 02/12/2012  Physical Exam  Nursing note and vitals reviewed. Constitutional: She is oriented to person, place, and time. She appears well-developed and well-nourished.  Non-toxic appearance.  HENT:  Head: Normocephalic.  Right Ear: Tympanic membrane and external ear normal.  Left Ear: Tympanic membrane and external ear normal.  Eyes: EOM and lids are normal. Pupils are equal, round, and reactive to light.  Neck: Normal range of motion. Neck supple. Carotid bruit is not present.  Cardiovascular: Normal rate, regular rhythm, normal heart sounds, intact distal pulses and normal pulses.   Pulmonary/Chest: Breath sounds normal. No respiratory distress.       Rhonchi present with occasional wheeze.  Abdominal: Soft. Bowel sounds are normal. There is no tenderness. There is no guarding.  Genitourinary:       There is a fine red rash at the area of the elastic of the top of the panty line.    Musculoskeletal: Normal range of motion.       There is a fine red rash between the fingers at the web space. There is a similar rash at the sock level of the lower leg.  Lymphadenopathy:       Head (right side): No submandibular adenopathy present.       Head (left side): No submandibular adenopathy present.    She has no cervical adenopathy.  Neurological: She is alert and oriented to person, place, and time. She has normal strength. No cranial nerve deficit or sensory deficit.  Skin: Skin is warm and dry.  Psychiatric: She has a normal mood and affect. Her speech is normal.    ED Course  Procedures (including critical care time)  Labs Reviewed - No data to display No results found. Pulse Ox 99% on room air. WNL by my interpretation.  1. Body lice infestation   2. URI (upper respiratory infection)       MDM  I have reviewed nursing notes, vital signs, and all appropriate lab and imaging results for this patient. Exam is consistent with body lice. Pt advised on washing linen and clothing daily. Rx for Elimite . Pt has URI present. Pt given cough meds for this. [facility] for [facility]ck.       Kathie Dike, [place] 02/26/12 1627

## 2012-02-27 NOTE — ED Provider Notes (Signed)
Medical screening examination/treatment/procedure(s) were performed by non-physician practitioner and as supervising physician I was immediately available for consultation/collaboration.   Benny Lennert, MD 02/27/12 1440

## 2012-08-17 ENCOUNTER — Encounter (HOSPITAL_COMMUNITY): Payer: Self-pay | Admitting: *Deleted

## 2012-08-17 ENCOUNTER — Emergency Department (HOSPITAL_COMMUNITY)
Admission: EM | Admit: 2012-08-17 | Discharge: 2012-08-17 | Disposition: A | Discharge status: Home / Self Care | Payer: BC Managed Care – PPO | Attending: Emergency Medicine | Admitting: Emergency Medicine

## 2012-08-17 DIAGNOSIS — F17200 Nicotine dependence, unspecified, uncomplicated: Secondary | ICD-10-CM | POA: Insufficient documentation

## 2012-08-17 DIAGNOSIS — Z79899 Other long term (current) drug therapy: Secondary | ICD-10-CM | POA: Insufficient documentation

## 2012-08-17 DIAGNOSIS — K029 Dental caries, unspecified: Secondary | ICD-10-CM | POA: Insufficient documentation

## 2012-08-17 DIAGNOSIS — R059 Cough, unspecified: Secondary | ICD-10-CM | POA: Insufficient documentation

## 2012-08-17 DIAGNOSIS — R05 Cough: Secondary | ICD-10-CM | POA: Insufficient documentation

## 2012-08-17 DIAGNOSIS — J209 Acute bronchitis, unspecified: Secondary | ICD-10-CM | POA: Insufficient documentation

## 2012-08-17 DIAGNOSIS — K089 Disorder of teeth and supporting structures, unspecified: Secondary | ICD-10-CM | POA: Insufficient documentation

## 2012-08-17 DIAGNOSIS — J3489 Other specified disorders of nose and nasal sinuses: Secondary | ICD-10-CM | POA: Insufficient documentation

## 2012-08-17 DIAGNOSIS — R21 Rash and other nonspecific skin eruption: Secondary | ICD-10-CM

## 2012-08-17 DIAGNOSIS — L299 Pruritus, unspecified: Secondary | ICD-10-CM | POA: Insufficient documentation

## 2012-08-17 DIAGNOSIS — Z7982 Long term (current) use of aspirin: Secondary | ICD-10-CM | POA: Insufficient documentation

## 2012-08-17 DIAGNOSIS — IMO0002 Reserved for concepts with insufficient information to code with codable children: Secondary | ICD-10-CM | POA: Insufficient documentation

## 2012-08-17 HISTORY — DX: Bronchitis, not specified as acute or chronic: J40

## 2012-08-17 MED ORDER — HYDROCODONE-IBUPROFEN 7.5-200 MG PO TABS: 1.0000 | ORAL_TABLET | Freq: Four times a day (QID) | ORAL | Status: DC | PRN

## 2012-08-17 MED ORDER — BENZOCAINE 20 % MT SOLN
Freq: Once | OROMUCOSAL | Status: AC
  Administered 2012-08-17: 16:00:00 via OROMUCOSAL
  Filled 2012-08-17: qty 57

## 2012-08-17 MED ORDER — TRAMADOL HCL 50 MG PO TABS: 50.0000 mg | ORAL_TABLET | Freq: Four times a day (QID) | ORAL | Status: DC | PRN

## 2012-08-17 MED ORDER — ALBUTEROL SULFATE HFA 108 (90 BASE) MCG/ACT IN AERS
2.0000 | INHALATION_SPRAY | RESPIRATORY_TRACT | Status: DC | PRN
  Administered 2012-08-17: 2 via RESPIRATORY_TRACT
  Filled 2012-08-17: qty 6.7

## 2012-08-17 MED ORDER — PREDNISONE 10 MG PO TABS: 40.0000 mg | ORAL_TABLET | Freq: Every day | ORAL | Status: DC

## 2012-08-17 MED ORDER — AMOXICILLIN 250 MG PO CAPS: 250.0000 mg | ORAL_CAPSULE | Freq: Three times a day (TID) | ORAL | Status: DC

## 2012-08-17 NOTE — ED Notes (Signed)
Pt received discharge instructions, verbalized understanding. Pt wanted different pain medicine. NP discussed pain med with pt. Pt left without signing out.

## 2012-08-17 NOTE — ED Notes (Signed)
Dental pain, since yesterday, rash for 1 week , "knot " lt inquinal area.   Rash is red, raised, and itches

## 2012-08-17 NOTE — ED Provider Notes (Signed)
Medical screening examination/treatment/procedure(s) were performed by non-physician practitioner and as supervising physician I was immediately available for consultation/collaboration.   Benny Lennert, MD 08/17/12 [phone]

## 2012-08-17 NOTE — ED Provider Notes (Signed)
History     CSN: [id number]  Arrival date & time 08/17/12  1449   First MD Initiated Contact with Patient 08/17/12 1521      Chief Complaint  Patient presents with  . Dental Pain    (Consider location/radiation/quality/duration/timing/severity/associated sxs/prior treatment) HPI Becky [name] is a 22 y.o. female who presents to the ED with dental pain. The pain started last night after eating when she broke a tooth in the lower back dental area. She has had problems with her teeth in the past but has not had money to see a dentist. She also complains of a rash on there chest, back arms and legs that that been there for about 4 days. She complains of itching. She denies any change in detergents, lotions or other things that she uses or eats. She has used Benadryl Cream without relief. She has also had cough and congestion and has a history of bronchitis. She smokes 1 and a half packs per day. The history was provided by the patient. Past Medical History  Diagnosis Date  . Bronchitis     History reviewed. No pertinent past surgical history.  History reviewed. No pertinent family history.  History  Substance Use Topics  . Smoking status: Current Every Day Smoker -- 1.00 packs/day    Types: Cigarettes  . Smokeless tobacco: Not on file  . Alcohol Use: Yes     Comment: occ    OB History   Grav Para Term Preterm Abortions TAB SAB Ect Mult Living                  Review of Systems  Constitutional: Negative for fever and chills.  HENT: Positive for congestion. Negative for neck pain.   Eyes: Negative for redness.  Respiratory: Positive for cough.   Cardiovascular: Negative for chest pain.  Gastrointestinal: Negative for nausea, vomiting and abdominal pain.  Skin: Positive for rash.  Neurological: Negative for headaches.  Psychiatric/Behavioral: Negative for confusion. The patient is not nervous/anxious.     Allergies  Tylenol  Home Medications   Current Outpatient  Rx  Name  Route  Sig  Dispense  Refill  . aspirin-acetaminophen-caffeine (EXCEDRIN MIGRAINE) 250-250-65 MG per tablet   Oral   Take 1-2 tablets by mouth daily as needed for pain.         . diphenhydrAMINE-zinc acetate (BENADRYL) cream   Topical   Apply 1 application topically daily as needed for itching.         Marland Kitchen Phenyleph-Doxylamine-DM-APAP (COLD/FLU RELIEF DAY/NIGHT PO)   Oral   Take 2 tablets by mouth every 4 (four) hours as needed (for cold and flu relief).         Marland Kitchen amoxicillin (AMOXIL) 250 MG capsule   Oral   Take 1 capsule (250 mg total) by mouth 3 (three) times daily.   28 capsule   0   . naproxen sodium (ALEVE) 220 MG tablet   Oral   Take 220 mg by mouth 2 (two) times daily as needed. Pain         . predniSONE (DELTASONE) 10 MG tablet   Oral   Take 4 tablets (40 mg total) by mouth daily.   16 tablet   0   . traMADol (ULTRAM) 50 MG tablet   Oral   Take 1 tablet (50 mg total) by mouth every 6 (six) hours as needed for pain.   15 tablet   0     BP 130/74  Pulse 82  Temp(Src) 98.2 F (36.8 C) (Oral)  Resp 16  Ht 5\' 2"  (1.575 m)  Wt 136 lb (61.689 kg)  BMI 24.87 kg/m2  SpO2 99%  LMP 07/26/2012  Physical Exam  Nursing note and vitals reviewed. Constitutional: She is oriented to person, place, and time. She appears well-developed and well-nourished.  Eyes: EOM are normal.  Neck: Neck supple.  Cardiovascular: Normal rate.   Pulmonary/Chest: Effort normal.  Rhonchi and wheezing    Abdominal: Soft. There is no tenderness.  Musculoskeletal: Normal range of motion.  Neurological: She is alert and oriented to person, place, and time. No cranial nerve deficit.  Skin: Skin is warm and dry.  Psychiatric: She has a normal mood and affect. Her behavior is normal. Judgment and thought content normal.    ED Course  Procedures (including critical care time)   1. Pain due to dental caries   2. Rash and nonspecific skin eruption   3. Bronchitis, acute      MDM  22 y.o. female with dental pain, rash and bronchitis. Discussed with patient use of antibiotics for dental infection, use of steroids for bronchitis and rash, itching, allergic reaction. Also gave albuterol inhaler. I have reviewed this patient's vital signs, nurses notes and discussed clinical findings and plan of care with the patient and she voices understanding.    Medication List    TAKE these medications       amoxicillin 250 MG capsule  Commonly known as:  AMOXIL  Take 1 capsule (250 mg total) by mouth 3 (three) times daily.     HYDROcodone-ibuprofen 7.5-200 MG per tablet  Commonly known as:  VICOPROFEN  Take 1 tablet by mouth every 6 (six) hours as needed for pain.     predniSONE 10 MG tablet  Commonly known as:  DELTASONE  Take 4 tablets (40 mg total) by mouth daily.      ASK your doctor about these medications       ALEVE 220 MG tablet  Generic drug:  naproxen sodium  Take 220 mg by mouth 2 (two) times daily as needed. Pain     aspirin-acetaminophen-caffeine 250-250-65 MG per tablet  Commonly known as:  EXCEDRIN MIGRAINE  Take 1-2 tablets by mouth daily as needed for pain.     COLD/FLU RELIEF DAY/NIGHT PO  Take 2 tablets by mouth every 4 (four) hours as needed (for cold and flu relief).     diphenhydrAMINE-zinc acetate cream  Commonly known as:  BENADRYL  Apply 1 application topically daily as needed for itching.              Janne Napoleon, [place] 08/17/12 [phone]

## 2012-09-16 ENCOUNTER — Encounter (HOSPITAL_COMMUNITY): Payer: Self-pay

## 2012-09-16 ENCOUNTER — Emergency Department (HOSPITAL_COMMUNITY)
Admission: EM | Admit: 2012-09-16 | Discharge: 2012-09-16 | Disposition: A | Discharge status: Home / Self Care | Payer: BC Managed Care – PPO | Attending: Emergency Medicine | Admitting: Emergency Medicine

## 2012-09-16 DIAGNOSIS — R220 Localized swelling, mass and lump, head: Secondary | ICD-10-CM | POA: Insufficient documentation

## 2012-09-16 DIAGNOSIS — K029 Dental caries, unspecified: Secondary | ICD-10-CM | POA: Insufficient documentation

## 2012-09-16 DIAGNOSIS — K047 Periapical abscess without sinus: Secondary | ICD-10-CM | POA: Insufficient documentation

## 2012-09-16 DIAGNOSIS — Z8709 Personal history of other diseases of the respiratory system: Secondary | ICD-10-CM | POA: Insufficient documentation

## 2012-09-16 DIAGNOSIS — F17200 Nicotine dependence, unspecified, uncomplicated: Secondary | ICD-10-CM | POA: Insufficient documentation

## 2012-09-16 MED ORDER — HYDROCODONE-IBUPROFEN 7.5-200 MG PO TABS: 1.0000 | ORAL_TABLET | Freq: Four times a day (QID) | ORAL | Status: DC | PRN

## 2012-09-16 MED ORDER — AMOXICILLIN 500 MG PO CAPS: 500.0000 mg | ORAL_CAPSULE | Freq: Three times a day (TID) | ORAL | Status: AC

## 2012-09-16 NOTE — ED Notes (Signed)
Pt reports "chipping her right top back tooth last night while eating a chicken wing"

## 2012-09-18 NOTE — ED Provider Notes (Signed)
History     CSN: [id number]  Arrival date & time 09/16/12  1502   First MD Initiated Contact with Patient 09/16/12 1519      Chief Complaint  Patient presents with  . Dental Pain    (Consider location/radiation/quality/duration/timing/severity/associated sxs/prior treatment) HPI Comments: Becky [name] is a 22 y.o. Female presenting wiith a 1 day history of worse dental pain and gingival swelling after chipping an already decaying tooth while eating a chicken wing.    There has been no fevers,  Chills, nausea or vomiting, also no complaint of difficulty swallowing,  Although chewing makes pain worse.  The patient has tried aleve without relief of symptoms.         The history is provided by the patient.    Past Medical History  Diagnosis Date  . Bronchitis     History reviewed. No pertinent past surgical history.  No family history on file.  History  Substance Use Topics  . Smoking status: Current Every Day Smoker -- 1.00 packs/day    Types: Cigarettes  . Smokeless tobacco: Not on file  . Alcohol Use: Yes     Comment: occ    OB History   Grav Para Term Preterm Abortions TAB SAB Ect Mult Living                  Review of Systems  Constitutional: Negative for fever.  HENT: Positive for dental problem. Negative for sore throat, facial swelling, neck pain and neck stiffness.   Respiratory: Negative for shortness of breath.     Allergies  Tylenol  Home Medications   Current Outpatient Rx  Name  Route  Sig  Dispense  Refill  . amoxicillin (AMOXIL) 250 MG capsule   Oral   Take 1 capsule (250 mg total) by mouth 3 (three) times daily.   28 capsule   0   . amoxicillin (AMOXIL) 500 MG capsule   Oral   Take 1 capsule (500 mg total) by mouth 3 (three) times daily.   30 capsule   0   . aspirin-acetaminophen-caffeine (EXCEDRIN MIGRAINE) 250-250-65 MG per tablet   Oral   Take 1-2 tablets by mouth daily as needed for pain.         .  diphenhydrAMINE-zinc acetate (BENADRYL) cream   Topical   Apply 1 application topically daily as needed for itching.         Marland Kitchen HYDROcodone-ibuprofen (VICOPROFEN) 7.5-200 MG per tablet   Oral   Take 1 tablet by mouth every 6 (six) hours as needed for pain.   15 tablet   0   . HYDROcodone-ibuprofen (VICOPROFEN) 7.5-200 MG per tablet   Oral   Take 1 tablet by mouth every 6 (six) hours as needed for pain.   15 tablet   0   . naproxen sodium (ALEVE) 220 MG tablet   Oral   Take 220 mg by mouth 2 (two) times daily as needed. Pain         . Phenyleph-Doxylamine-DM-APAP (COLD/FLU RELIEF DAY/NIGHT PO)   Oral   Take 2 tablets by mouth every 4 (four) hours as needed (for cold and flu relief).         . predniSONE (DELTASONE) 10 MG tablet   Oral   Take 4 tablets (40 mg total) by mouth daily.   16 tablet   0     BP 132/78  Pulse 100  Temp(Src) 98.9 F (37.2 C) (Oral)  Resp 18  Ht 5'  2" (1.575 m)  Wt 136 lb (61.689 kg)  BMI 24.87 kg/m2  SpO2 99%  LMP 08/26/2012  Physical Exam  Constitutional: She is oriented to person, place, and time. She appears well-developed and well-nourished. No distress.  HENT:  Head: Normocephalic and atraumatic. No trismus in the jaw.  Right Ear: Tympanic membrane and external ear normal.  Left Ear: Tympanic membrane and external ear normal.  Mouth/Throat: Oropharynx is clear and moist and mucous membranes are normal. No oral lesions. Dental abscesses and dental caries present.    Eyes: Conjunctivae are normal.  Neck: Normal range of motion. Neck supple.  Cardiovascular: Normal rate and normal heart sounds.   Pulmonary/Chest: Effort normal.  Abdominal: She exhibits no distension.  Musculoskeletal: Normal range of motion.  Lymphadenopathy:    She has no cervical adenopathy.  Neurological: She is alert and oriented to person, place, and time.  Skin: Skin is warm and dry. No erythema.  Psychiatric: She has a normal mood and affect.    ED  Course  Procedures (including critical care time)  Labs Reviewed - No data to display No results found.   1. Dental decay   2. Dental abscess       MDM  Prescribed amoxil, vicoprofen.  Dental referrals given.  The patient appears reasonably screened and/or stabilized for discharge and I doubt any other medical condition or other [facility] requiring further screening, evaluation, or treatment in the ED at this time prior to discharge.         Burgess Amor, PA-C 09/18/12 [phone]

## 2012-09-19 NOTE — ED Provider Notes (Signed)
Medical screening examination/treatment/procedure(s) were performed by non-physician practitioner and as supervising physician I was immediately available for consultation/collaboration.  Donnetta Hutching, MD 09/19/12 [phone]

## 2013-01-13 ENCOUNTER — Encounter (HOSPITAL_COMMUNITY): Payer: Self-pay | Admitting: *Deleted

## 2013-01-13 ENCOUNTER — Emergency Department (HOSPITAL_COMMUNITY)
Admission: EM | Admit: 2013-01-13 | Discharge: 2013-01-13 | Disposition: A | Discharge status: Home / Self Care | Payer: BC Managed Care – PPO | Attending: Emergency Medicine | Admitting: Emergency Medicine

## 2013-01-13 DIAGNOSIS — F17200 Nicotine dependence, unspecified, uncomplicated: Secondary | ICD-10-CM | POA: Insufficient documentation

## 2013-01-13 DIAGNOSIS — Z792 Long term (current) use of antibiotics: Secondary | ICD-10-CM | POA: Insufficient documentation

## 2013-01-13 DIAGNOSIS — K0889 Other specified disorders of teeth and supporting structures: Secondary | ICD-10-CM

## 2013-01-13 DIAGNOSIS — IMO0002 Reserved for concepts with insufficient information to code with codable children: Secondary | ICD-10-CM | POA: Insufficient documentation

## 2013-01-13 DIAGNOSIS — Z8709 Personal history of other diseases of the respiratory system: Secondary | ICD-10-CM | POA: Insufficient documentation

## 2013-01-13 DIAGNOSIS — Z79899 Other long term (current) drug therapy: Secondary | ICD-10-CM | POA: Insufficient documentation

## 2013-01-13 DIAGNOSIS — K089 Disorder of teeth and supporting structures, unspecified: Secondary | ICD-10-CM | POA: Insufficient documentation

## 2013-01-13 MED ORDER — MELOXICAM 7.5 MG PO TABS: 7.5000 mg | ORAL_TABLET | Freq: Every day | ORAL | Status: DC

## 2013-01-13 MED ORDER — OXYCODONE HCL 5 MG PO TABS
5.0000 mg | ORAL_TABLET | Freq: Once | ORAL | Status: AC
  Administered 2013-01-13: 5 mg via ORAL
  Filled 2013-01-13: qty 1

## 2013-01-13 MED ORDER — CLINDAMYCIN HCL 150 MG PO CAPS: 150.0000 mg | ORAL_CAPSULE | Freq: Four times a day (QID) | ORAL | Status: DC

## 2013-01-13 NOTE — ED Provider Notes (Signed)
CSN: [id number]     Arrival date & time 01/13/13  1711 History   First MD Initiated Contact with Patient 01/13/13 1753     Chief Complaint  Patient presents with  . Dental Pain   (Consider location/radiation/quality/duration/timing/severity/associated sxs/prior Treatment) Patient is a 22 y.o. female presenting with tooth pain. The history is provided by the patient.  Dental Pain Location:  Upper and lower Upper teeth location:  1/RU 3rd molar Lower teeth location:  17/LL 3rd molar and 32/RL 3rd molar Quality:  Throbbing and constant Onset quality:  Gradual Duration:  3 days Timing:  Constant Chronicity:  Chronic Relieved by:  Nothing Worsened by:  Cold food/drink, hot food/drink and pressure Ineffective treatments:  Acetaminophen and NSAIDs Associated symptoms: no fever, no headaches and no neck pain    BLESSYN SOMMERVILLE is a 22 y.o. female who presents to the ED with dental pain. She has had problems with her wisdom teeth trying to come in and will have to have them surgically removed but does not have appointment with anyone yet. Complains of severe pain in upper and lower back dental area.   Past Medical History  Diagnosis Date  . Bronchitis    History reviewed. No pertinent past surgical history. No family history on file. History  Substance Use Topics  . Smoking status: Current Every Day Smoker -- 1.00 packs/day    Types: Cigarettes  . Smokeless tobacco: Not on file  . Alcohol Use: Yes     Comment: occ   OB History   Grav Para Term Preterm Abortions TAB SAB Ect Mult Living                 Review of Systems  Constitutional: Negative for fever and chills.  HENT: Positive for dental problem. Negative for neck pain.   Eyes: Negative for pain.  Respiratory: Negative for cough and shortness of breath.   Gastrointestinal: Negative for nausea and vomiting.  Skin: Negative for rash.  Neurological: Negative for headaches.  Psychiatric/Behavioral: The patient is not  nervous/anxious.     Allergies  Tylenol  Home Medications   Current Outpatient Rx  Name  Route  Sig  Dispense  Refill  . amoxicillin (AMOXIL) 250 MG capsule   Oral   Take 1 capsule (250 mg total) by mouth 3 (three) times daily.   28 capsule   0   . aspirin-acetaminophen-caffeine (EXCEDRIN MIGRAINE) 250-250-65 MG per tablet   Oral   Take 1-2 tablets by mouth daily as needed for pain.         . diphenhydrAMINE-zinc acetate (BENADRYL) cream   Topical   Apply 1 application topically daily as needed for itching.         Marland Kitchen HYDROcodone-ibuprofen (VICOPROFEN) 7.5-200 MG per tablet   Oral   Take 1 tablet by mouth every 6 (six) hours as needed for pain.   15 tablet   0   . HYDROcodone-ibuprofen (VICOPROFEN) 7.5-200 MG per tablet   Oral   Take 1 tablet by mouth every 6 (six) hours as needed for pain.   15 tablet   0   . naproxen sodium (ALEVE) 220 MG tablet   Oral   Take 220 mg by mouth 2 (two) times daily as needed. Pain         . Phenyleph-Doxylamine-DM-APAP (COLD/FLU RELIEF DAY/NIGHT PO)   Oral   Take 2 tablets by mouth every 4 (four) hours as needed (for cold and flu relief).         Marland Kitchen  predniSONE (DELTASONE) 10 MG tablet   Oral   Take 4 tablets (40 mg total) by mouth daily.   16 tablet   0    BP 147/78  Pulse 112  Temp(Src) 99.3 F (37.4 C) (Oral)  Resp 16  Ht 5\' 2"  (1.575 m)  Wt 135 lb (61.236 kg)  BMI 24.69 kg/m2  SpO2 100%  LMP 01/13/2013 Physical Exam  Nursing note and vitals reviewed. Constitutional: She is oriented to person, place, and time. She appears well-developed and well-nourished. No distress.  HENT:  Head: Normocephalic.  Right Ear: Tympanic membrane normal.  Left Ear: Tympanic membrane normal.  Nose: Nose normal.  Mouth/Throat: Uvula is midline, oropharynx is clear and moist and mucous membranes are normal. Dental caries present.    Eyes: EOM are normal.  Neck: Neck supple.  Cardiovascular: Normal rate and regular rhythm.     Pulmonary/Chest: Effort normal and breath sounds normal.  Abdominal: Soft. There is no tenderness.  Musculoskeletal: Normal range of motion.  Neurological: She is alert and oriented to person, place, and time. No cranial nerve deficit.  Skin: Skin is warm and dry.  Psychiatric: She has a normal mood and affect. Her behavior is normal.    ED Course  Procedures  MDM  22 y.o. female with dental pain due to erupting third molars. Will treat with antibiotics and NSAIDS. Discussed with the patient and all questioned fully answered. She will follow up with the oral surgeon as planned. Patient stable for discharge without any immediate complications.    Medication List    TAKE these medications       clindamycin 150 MG capsule  Commonly known as:  CLEOCIN  Take 1 capsule (150 mg total) by mouth every 6 (six) hours.     meloxicam 7.5 MG tablet  Commonly known as:  MOBIC  Take 1 tablet (7.5 mg total) by mouth daily.      ASK your doctor about these medications       ALEVE 220 MG tablet  Generic drug:  naproxen sodium  Take 220 mg by mouth 2 (two) times daily as needed. Pain     aspirin-acetaminophen-caffeine 250-250-65 MG per tablet  Commonly known as:  EXCEDRIN MIGRAINE  Take 1 tablet by mouth every 8 (eight) hours as needed for pain. headache     ibuprofen 200 MG tablet  Commonly known as:  ADVIL,MOTRIN  Take 800 mg by mouth every 6 (six) hours as needed for pain.         [address] [place], [place] 01/13/13 [phone]

## 2013-01-13 NOTE — ED Notes (Signed)
[  pt c/o dental pain that became worse this past Thursday,

## 2013-01-14 NOTE — ED Provider Notes (Signed)
Medical screening examination/treatment/procedure(s) were performed by non-physician practitioner and as supervising physician I was immediately available for consultation/collaboration. Devoria Albe, MD, Armando Gang   Ward Givens, MD 01/14/13 [phone]

## 2013-02-18 ENCOUNTER — Encounter (HOSPITAL_COMMUNITY): Payer: Self-pay | Admitting: *Deleted

## 2013-02-18 ENCOUNTER — Emergency Department (HOSPITAL_COMMUNITY)
Admission: EM | Admit: 2013-02-18 | Discharge: 2013-02-18 | Discharge status: Against Medical Advice | Payer: Self-pay | Attending: Emergency Medicine | Admitting: Emergency Medicine

## 2013-02-18 DIAGNOSIS — F1110 Opioid abuse, uncomplicated: Secondary | ICD-10-CM | POA: Insufficient documentation

## 2013-02-18 DIAGNOSIS — Z8659 Personal history of other mental and behavioral disorders: Secondary | ICD-10-CM | POA: Insufficient documentation

## 2013-02-18 DIAGNOSIS — F17200 Nicotine dependence, unspecified, uncomplicated: Secondary | ICD-10-CM | POA: Insufficient documentation

## 2013-02-18 DIAGNOSIS — Z8709 Personal history of other diseases of the respiratory system: Secondary | ICD-10-CM | POA: Insufficient documentation

## 2013-02-18 NOTE — ED Provider Notes (Signed)
CSN: [id number]     Arrival date & time 02/18/13  1000 History  This chart was scribed for Ward Givens, MD by Quintella Reichert, ED scribe.  This patient was seen in room APA03/APA03 and the patient's care was started at 10:53 AM.   Chief Complaint  Patient presents with  . V70.1    The history is provided by the patient. No language interpreter was used.    HPI Comments: Becky [name] is a 22 y.o. female sent to the Emergency Department from [facility] for Medical Clearance.  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  She states she was told to come to the ED for medical clearance.  She has a bed ready at a facility in [place], [facility].  She states she was told to return to [facility] after she was medically cleared but she needed to be there by noon. However she states that she does not want to go back to [facility] and would prefer to go to [facility] and wants to be cleared at an Urgent Care close to that facility.  She states she does not want to be examined here.  She denies SI, HI or depression.  Pt notes that she was in [facility] for 13 days last year and was only sober for a week after being released.  However she states that she was "not ready" at that time and now "I'm ready to get sober." States she was told she could come back for treatment again.     Past Medical History  Diagnosis Date  . Bronchitis   . Depression     History reviewed. No pertinent past surgical history.   No family history on file.   History  Substance Use Topics  . Smoking status: Current Every Day Smoker -- 1.00 packs/day    Types: Cigarettes  . Smokeless tobacco: Not on file  . Alcohol Use: Yes     Comment: occ     OB History   Grav Para Term Preterm Abortions TAB SAB Ect Mult Living                  Review of Systems  Psychiatric/Behavioral: Negative for suicidal ideas and dysphoric mood.       Substance  abuse  All other systems reviewed and are negative.     Allergies  Tylenol  Home Medications  No current outpatient prescriptions on file.  LMP 02/17/2013  Physical Exam  Nursing note and vitals reviewed. Constitutional: She is oriented to person, place, and time. She appears well-developed and well-nourished. No distress.  Exam refused.  Pt wants to leave.  HENT:  Head: Normocephalic and atraumatic.  Right Ear: External ear normal.  Left Ear: External ear normal.  Eyes: Conjunctivae and EOM are normal. Pupils are equal, round, and reactive to light.  Neck: Normal range of motion. Neck supple. No tracheal deviation present.  Pulmonary/Chest: Effort normal. No respiratory distress.  Musculoskeletal: Normal range of motion.  Moves all extremities well, gait normal  Neurological: She is alert and oriented to person, place, and time. No cranial nerve deficit.  Skin: No rash noted.  Psychiatric: She has a normal mood and affect. Her behavior is normal.    ED Course  Procedures (including critical care time)   COORDINATION OF CARE: 10:55 AM-Informed pt that if she leaves it will be AMA.  Pt verbalizes understanding and states she wants to  leave. Pt denies SI, HI and can leave. States she wants to go to [facility] and be seen at the Urgent Care there, does not want to get her Medical Clearance here.     MDM   1. Narcotic abuse     Pt left AMA  I personally performed the services described in this documentation, which was scribed in my presence. The recorded information has been reviewed and considered.   Devoria Albe, MD, Armando Gang    Ward Givens, MD 02/18/13 [phone]

## 2013-02-18 NOTE — ED Notes (Signed)
Pt left AMA, states she will go to [facility] because they have all her information there and are familiar with her. Pt signed AMA form, states understanding of AMA questions.

## 2013-02-18 NOTE — ED Notes (Signed)
Pt was wanded in triage.

## 2013-02-18 NOTE — Progress Notes (Signed)
ED/CM noted patient did not have health insurance and/or PCP listed in the computer.  Patient was given the [place] resource handout with information on the clinics, food pantries, and the handout for new health insurance sign-up.  Patient expressed appreciation for this. 

## 2013-02-18 NOTE — ED Notes (Signed)
Denies SI/HI

## 2013-02-18 NOTE — ED Notes (Signed)
Pt states she was sent here from [facility]. States she only needs medical clearance so that she can go to [facility]. Pt states she is supposed to return to [facility] after being medically cleared.

## 2013-09-04 ENCOUNTER — Encounter (HOSPITAL_COMMUNITY): Payer: Self-pay | Admitting: Emergency Medicine

## 2013-09-04 ENCOUNTER — Emergency Department (HOSPITAL_COMMUNITY)
Admission: EM | Admit: 2013-09-04 | Discharge: 2013-09-05 | Disposition: A | Discharge status: Home / Self Care | Payer: Self-pay | Attending: Emergency Medicine | Admitting: Emergency Medicine

## 2013-09-04 ENCOUNTER — Emergency Department (HOSPITAL_COMMUNITY): Payer: Self-pay

## 2013-09-04 DIAGNOSIS — Z8709 Personal history of other diseases of the respiratory system: Secondary | ICD-10-CM | POA: Insufficient documentation

## 2013-09-04 DIAGNOSIS — Z8659 Personal history of other mental and behavioral disorders: Secondary | ICD-10-CM | POA: Insufficient documentation

## 2013-09-04 DIAGNOSIS — F17200 Nicotine dependence, unspecified, uncomplicated: Secondary | ICD-10-CM | POA: Insufficient documentation

## 2013-09-04 DIAGNOSIS — N760 Acute vaginitis: Secondary | ICD-10-CM | POA: Insufficient documentation

## 2013-09-04 DIAGNOSIS — A499 Bacterial infection, unspecified: Secondary | ICD-10-CM | POA: Insufficient documentation

## 2013-09-04 DIAGNOSIS — B9689 Other specified bacterial agents as the cause of diseases classified elsewhere: Secondary | ICD-10-CM | POA: Insufficient documentation

## 2013-09-04 DIAGNOSIS — R1110 Vomiting, unspecified: Secondary | ICD-10-CM | POA: Insufficient documentation

## 2013-09-04 DIAGNOSIS — R1013 Epigastric pain: Secondary | ICD-10-CM | POA: Insufficient documentation

## 2013-09-04 DIAGNOSIS — Z3202 Encounter for pregnancy test, result negative: Secondary | ICD-10-CM | POA: Insufficient documentation

## 2013-09-04 LAB — COMPREHENSIVE METABOLIC PANEL
ALBUMIN: 4.2 g/dL (ref 3.5–5.2)
ALT: 8 U/L (ref 0–35)
AST: 14 U/L (ref 0–37)
Alkaline Phosphatase: 71 U/L (ref 39–117)
BUN: 7 mg/dL (ref 6–23)
CALCIUM: 10 mg/dL (ref 8.4–10.5)
CO2: 19 mEq/L (ref 19–32)
CREATININE: 0.72 mg/dL (ref 0.50–1.10)
Chloride: 104 mEq/L (ref 96–112)
GFR calc non Af Amer: >90 mL/min (ref >90)
GLUCOSE: 93 mg/dL (ref 70–99)
Potassium: 3.5 mEq/L — ABNORMAL LOW (ref 3.7–5.3)
Sodium: 139 mEq/L (ref 137–147)
TOTAL PROTEIN: 7.3 g/dL (ref 6.0–8.3)
Total Bilirubin: 0.4 mg/dL (ref 0.3–1.2)

## 2013-09-04 LAB — WET PREP, GENITAL
TRICH WET PREP: NONE SEEN
YEAST WET PREP: NONE SEEN

## 2013-09-04 LAB — URINALYSIS, ROUTINE W REFLEX MICROSCOPIC
BILIRUBIN URINE: NEGATIVE
GLUCOSE, UA: NEGATIVE mg/dL
KETONES UR: 15 mg/dL — AB
LEUKOCYTES UA: NEGATIVE
NITRITE: NEGATIVE
PH: 8 (ref 5.0–8.0)
PROTEIN: NEGATIVE mg/dL
Specific Gravity, Urine: 1.015 (ref 1.005–1.030)
Urobilinogen, UA: 0.2 mg/dL (ref 0.0–1.0)

## 2013-09-04 LAB — CBC WITH DIFFERENTIAL/PLATELET
BASOS PCT: 0 % (ref 0–1)
Basophils Absolute: 0.1 10*3/uL (ref 0.0–0.1)
EOS ABS: 0.1 10*3/uL (ref 0.0–0.7)
EOS PCT: 1 % (ref 0–5)
HCT: 40.2 % (ref 36.0–46.0)
HEMOGLOBIN: 14 g/dL (ref 12.0–15.0)
LYMPHS ABS: 3.7 10*3/uL (ref 0.7–4.0)
Lymphocytes Relative: 21 % (ref 12–46)
MCH: 31 pg (ref 26.0–34.0)
MCHC: 34.8 g/dL (ref 30.0–36.0)
MCV: 88.9 fL (ref 78.0–100.0)
MONOS PCT: 6 % (ref 3–12)
Monocytes Absolute: 1.1 10*3/uL — ABNORMAL HIGH (ref 0.1–1.0)
NEUTROS PCT: 72 % (ref 43–77)
Neutro Abs: 13 10*3/uL — ABNORMAL HIGH (ref 1.7–7.7)
Platelets: 402 10*3/uL — ABNORMAL HIGH (ref 150–400)
RBC: 4.52 MIL/uL (ref 3.87–5.11)
RDW: 12.7 % (ref 11.5–15.5)
WBC: 17.9 10*3/uL — ABNORMAL HIGH (ref 4.0–10.5)

## 2013-09-04 LAB — URINE MICROSCOPIC-ADD ON

## 2013-09-04 LAB — PREGNANCY, URINE: Preg Test, Ur: NEGATIVE

## 2013-09-04 LAB — I-STAT CG4 LACTIC ACID, ED
LACTIC ACID, VENOUS: 2.37 mmol/L — AB (ref 0.5–2.2)
Lactic Acid, Venous: 0.75 mmol/L (ref 0.5–2.2)

## 2013-09-04 LAB — LIPASE, BLOOD: LIPASE: 25 U/L (ref 11–59)

## 2013-09-04 MED ORDER — IOHEXOL 300 MG/ML  SOLN
50.0000 mL | Freq: Once | INTRAMUSCULAR | Status: AC | PRN
  Administered 2013-09-04: 50 mL via ORAL

## 2013-09-04 MED ORDER — IOHEXOL 300 MG/ML  SOLN
100.0000 mL | Freq: Once | INTRAMUSCULAR | Status: AC | PRN
  Administered 2013-09-04: 100 mL via INTRAVENOUS

## 2013-09-04 MED ORDER — KETOROLAC TROMETHAMINE 30 MG/ML IJ SOLN
30.0000 mg | Freq: Once | INTRAMUSCULAR | Status: AC
  Administered 2013-09-05: 30 mg via INTRAVENOUS
  Filled 2013-09-04: qty 1

## 2013-09-04 MED ORDER — SODIUM CHLORIDE 0.9 % IV BOLUS (SEPSIS)
1000.0000 mL | Freq: Once | INTRAVENOUS | Status: AC
  Administered 2013-09-04: 1000 mL via INTRAVENOUS

## 2013-09-04 MED ORDER — HYDROMORPHONE HCL PF 1 MG/ML IJ SOLN
1.0000 mg | Freq: Once | INTRAMUSCULAR | Status: AC
  Administered 2013-09-05: 1 mg via INTRAVENOUS
  Filled 2013-09-04: qty 1

## 2013-09-04 MED ORDER — ONDANSETRON HCL 4 MG/2ML IJ SOLN
4.0000 mg | Freq: Once | INTRAMUSCULAR | Status: AC
  Administered 2013-09-04: 4 mg via INTRAVENOUS
  Filled 2013-09-04: qty 2

## 2013-09-04 MED ORDER — MORPHINE SULFATE 4 MG/ML IJ SOLN
4.0000 mg | Freq: Once | INTRAMUSCULAR | Status: AC
  Administered 2013-09-04: 4 mg via INTRAVENOUS
  Filled 2013-09-04: qty 1

## 2013-09-04 MED ORDER — GI COCKTAIL ~~LOC~~
30.0000 mL | Freq: Once | ORAL | Status: AC
  Administered 2013-09-05: 30 mL via ORAL
  Filled 2013-09-04: qty 30

## 2013-09-04 NOTE — ED Provider Notes (Signed)
CSN: [id number]     Arrival date & time 09/04/13  2034 History   This chart was scribed for Glynn OctaveStephen Suzie Vandam, MD by Bennett Scrapehristina Taylor, ED Scribe. This patient was seen in room APA04/APA04 and the patient's care was started at 9:22 PM.   Chief Complaint  Patient presents with  . Abdominal Pain     The history is provided by the patient. No language interpreter was used.   HPI Comments: Becky [name] is a 23 y.o. female who presents to the Emergency Department complaining of intermittent epigastric abdominal pain described as stabbing that started while laying down around 3 PM this afternoon. She states that changing positions and "changing my breathing" improve her pain. She denies any worsening modifying factors. She reports one episode of associated emesis and 2 days of new vaginal discharge. She denies any prior episodes. 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  She denies any dysuria, hematuria, SOB or vaginal bleeding associated symptoms. LNMP was 2 weeks ago. She denies any alcohol use.   No PCP  Past Medical History  Diagnosis Date  . Bronchitis   . Depression    History reviewed. No pertinent past surgical history. No family history on file. History  Substance Use Topics  . Smoking status: Current Every Day Smoker -- 1.00 packs/day    Types: Cigarettes  . Smokeless tobacco: Not on file  . Alcohol Use: Yes     Comment: occ   No OB history provided.  Review of Systems  A complete 10 system review of systems was obtained and all systems are negative except as noted in the HPI and PMH.    Allergies  Tylenol  Home Medications   Prior to Admission medications   Not on File   Triage vitals: BP 111/51  Pulse 71  Temp(Src) 98.3 F (36.8 C) (Oral)  Resp 22  Ht 5\' 2"  (1.575 m)  Wt 142 lb (64.411 kg)  BMI 25.97 kg/m2  SpO2 100%  LMP  08/21/2013  Physical Exam  Nursing note and vitals reviewed. Constitutional: She is oriented to person, place, and time. She appears well-developed and well-nourished.  Appears uncomfortable   HENT:  Head: Normocephalic and atraumatic.  Eyes: EOM are normal.  Neck: Neck supple. No tracheal deviation present.  Cardiovascular: Normal rate and regular rhythm.   Pulmonary/Chest: Effort normal and breath sounds normal. No respiratory distress.  Abdominal: Soft. There is tenderness. There is no rebound and no guarding.  No CVA Tenderness, Epigastric Tenderness,No RUQ tenderness, mild RLQ tenderness  Genitourinary:  NEFG, scant white discharge, tenderness to uterus, no lateralizing tenderness, no CMT, chaperone present  Musculoskeletal: Normal range of motion.  Intact peripheral pulses, no peripheral edema   Neurological: She is alert and oriented to person, place, and time.  Skin: Skin is warm and dry.  Psychiatric: She has a normal mood and affect. Her behavior is normal.    ED Course  Procedures (including critical care time)  Medications  sodium chloride 0.9 % bolus 1,000 mL (0 mLs Intravenous Stopped 09/05/13 0011)  ondansetron (ZOFRAN) injection 4 mg (4 mg Intravenous Given 09/04/13 2204)  morphine 4 MG/ML injection 4 mg (4 mg Intravenous Given 09/04/13 2204)  iohexol (OMNIPAQUE) 300 MG/ML solution 50 mL (50 mLs Oral Contrast Given 09/04/13 2156)  iohexol (OMNIPAQUE) 300 MG/ML solution 100 mL (100 mLs Intravenous Contrast Given 09/04/13 2322)  HYDROmorphone (  DILAUDID) injection 1 mg (1 mg Intravenous Given 09/05/13 0001)  ketorolac (TORADOL) 30 MG/ML injection 30 mg (30 mg Intravenous Given 09/05/13 0001)  gi cocktail (Maalox,Lidocaine,Donnatal) (30 mLs Oral Given 09/05/13 0013)    DIAGNOSTIC STUDIES: Oxygen Saturation is 100% on RA, normal by my interpretation.    COORDINATION OF CARE: 9:27 PM-Discussed treatment plan which includes medication, CBC panel, CMP, and UA with pt at  bedside and pt agreed to plan.      Labs Review Labs Reviewed  WET PREP, GENITAL - Abnormal; Notable for the following:    Clue Cells Wet Prep HPF POC FEW (*)    WBC, Wet Prep HPF POC FEW (*)    All other components within normal limits  URINALYSIS, ROUTINE W REFLEX MICROSCOPIC - Abnormal; Notable for the following:    Hgb urine dipstick TRACE (*)    Ketones, ur 15 (*)    All other components within normal limits  CBC WITH DIFFERENTIAL - Abnormal; Notable for the following:    WBC 17.9 (*)    Platelets 402 (*)    Neutro Abs 13.0 (*)    Monocytes Absolute 1.1 (*)    All other components within normal limits  COMPREHENSIVE METABOLIC PANEL - Abnormal; Notable for the following:    Potassium 3.5 (*)    All other components within normal limits  URINE MICROSCOPIC-ADD ON - Abnormal; Notable for the following:    Squamous Epithelial / LPF MANY (*)    Bacteria, UA FEW (*)    Casts RED CELL CAST (*)    All other components within normal limits  I-STAT CG4 LACTIC ACID, ED - Abnormal; Notable for the following:    Lactic Acid, Venous 2.37 (*)    All other components within normal limits  GC/CHLAMYDIA PROBE AMP  PREGNANCY, URINE  LIPASE, BLOOD  I-STAT CG4 LACTIC ACID, ED    Imaging Review Ct Abdomen Pelvis W Contrast  09/04/2013   CLINICAL DATA:  Right lower quadrant pain  EXAM: CT ABDOMEN AND PELVIS WITH CONTRAST  TECHNIQUE: Multidetector CT imaging of the abdomen and pelvis was performed using the standard protocol following bolus administration of intravenous contrast.  CONTRAST:  50mL OMNIPAQUE IOHEXOL 300 MG/ML SOLN, 100mL OMNIPAQUE IOHEXOL 300 MG/ML SOLN  COMPARISON:  None.  FINDINGS: Normal appendix. Liver, gallbladder, spleen, pancreas, adrenal glands, and kidneys are within normal limits.  Bladder, uterus, and adnexa are within normal limits  Small amount of free fluid in the pelvis is likely physiologic.  IMPRESSION: No acute intra-abdominal pathology.   Electronically Signed    By: Maryclare BeanArt  Hoss M.D.   On: 09/04/2013 23:53     EKG Interpretation None      MDM   Final diagnoses:  Epigastric pain  Bacterial vaginosis  Severe epigastric abdominal pain since 3 pm.  Associated with vomiting.  No diarrhea, no fever.  Pelvic exam benign. Leukocytosis, labs otherwise unremarkable. LFTs and lipase normal. CT negative for acute pathology. Appendix normal. Repeat lactate wnl. UA negative  Patient reports history of PUD with endoscopies in the past, not available in epic. Suspect could be PUD, gastritis, GERD. Advised to avoid, NSAIDS, smoking, alcohol. Restart PPI and antiemetic.  Will schedule RUQ US tomorrow.   Tolerating PO in the ED. Return precautions discussed. BP 111/51  Pulse 71  Temp(Src) 98.3 F (36.8 C) (Oral)  Resp 22  Ht 5\' 2"  (1.575 m)  Wt 142 lb (64.411 kg)  BMI 25.97 kg/m2  SpO2 100%  LMP 08/21/2013  I personally performed the services described in this documentation, which was scribed in my presence. The recorded information has been reviewed and is accurate.     Glynn Octave, MD 09/05/13 1322

## 2013-09-04 NOTE — ED Notes (Signed)
Upper abd and epigastric pain that started approx 3 pm, states she has also been vomiting, no diarrhea

## 2013-09-05 ENCOUNTER — Other Ambulatory Visit (HOSPITAL_COMMUNITY): Payer: Self-pay | Admitting: Emergency Medicine

## 2013-09-05 ENCOUNTER — Ambulatory Visit (HOSPITAL_COMMUNITY): Admit: 2013-09-05 | Payer: Self-pay

## 2013-09-05 DIAGNOSIS — R1011 Right upper quadrant pain: Secondary | ICD-10-CM

## 2013-09-05 MED ORDER — ONDANSETRON HCL 4 MG PO TABS: 4.0000 mg | ORAL_TABLET | Freq: Four times a day (QID) | ORAL | Status: DC

## 2013-09-05 MED ORDER — OMEPRAZOLE 20 MG PO CPDR: 20.0000 mg | DELAYED_RELEASE_CAPSULE | Freq: Every day | ORAL | Status: DC

## 2013-09-05 NOTE — Discharge Instructions (Signed)
Abdominal Pain, Adult Follow up with your stomach doctor and take the medications as prescribed. Return for an ultrasound of your gallbladder tomorrow. Return to the ED if you develop new or worsening symptoms. Many things can cause abdominal pain. Usually, abdominal pain is not caused by a disease and will improve without treatment. It can often be observed and treated at home. Your health care provider will do a physical exam and possibly order blood tests and X-rays to help determine the seriousness of your pain. However, in many cases, more time must pass before a clear cause of the pain can be found. Before that point, your health care provider may not know if you need more testing or further treatment. HOME CARE INSTRUCTIONS  Monitor your abdominal pain for any changes. The following actions may help to alleviate any discomfort you are experiencing:  Only take over-the-counter or prescription medicines as directed by your health care provider.  Do not take laxatives unless directed to do so by your health care provider.  Try a clear liquid diet (broth, tea, or water) as directed by your health care provider. Slowly move to a bland diet as tolerated. SEEK MEDICAL CARE IF:  You have unexplained abdominal pain.  You have abdominal pain associated with nausea or diarrhea.  You have pain when you urinate or have a bowel movement.  You experience abdominal pain that wakes you in the night.  You have abdominal pain that is worsened or improved by eating food.  You have abdominal pain that is worsened with eating fatty foods. SEEK IMMEDIATE MEDICAL CARE IF:   Your pain does not go away within 2 hours.  You have a fever.  You keep throwing up (vomiting).  Your pain is felt only in portions of the abdomen, such as the right side or the left lower portion of the abdomen.  You pass bloody or black tarry stools. MAKE SURE YOU:  Understand these instructions.   Will watch your  condition.   Will get help right away if you are not doing well or get worse.  Document Released: 02/09/2005 Document Revised: 02/20/2013 Document Reviewed: 01/09/2013 [employer] Patient Information 2014 [employer], [place].

## 2013-09-06 LAB — GC/CHLAMYDIA PROBE AMP
CT Probe RNA: NEGATIVE
GC PROBE AMP APTIMA: NEGATIVE

## 2013-11-13 ENCOUNTER — Emergency Department (HOSPITAL_COMMUNITY)
Admission: EM | Admit: 2013-11-13 | Discharge: 2013-11-13 | Disposition: A | Discharge status: Home / Self Care | Payer: Self-pay | Attending: Emergency Medicine | Admitting: Emergency Medicine

## 2013-11-13 ENCOUNTER — Encounter (HOSPITAL_COMMUNITY): Payer: Self-pay | Admitting: Emergency Medicine

## 2013-11-13 DIAGNOSIS — F17200 Nicotine dependence, unspecified, uncomplicated: Secondary | ICD-10-CM | POA: Insufficient documentation

## 2013-11-13 DIAGNOSIS — K279 Peptic ulcer, site unspecified, unspecified as acute or chronic, without hemorrhage or perforation: Secondary | ICD-10-CM | POA: Insufficient documentation

## 2013-11-13 DIAGNOSIS — Z792 Long term (current) use of antibiotics: Secondary | ICD-10-CM | POA: Insufficient documentation

## 2013-11-13 DIAGNOSIS — Z79899 Other long term (current) drug therapy: Secondary | ICD-10-CM | POA: Insufficient documentation

## 2013-11-13 DIAGNOSIS — Z3202 Encounter for pregnancy test, result negative: Secondary | ICD-10-CM | POA: Insufficient documentation

## 2013-11-13 DIAGNOSIS — Z8709 Personal history of other diseases of the respiratory system: Secondary | ICD-10-CM | POA: Insufficient documentation

## 2013-11-13 DIAGNOSIS — N72 Inflammatory disease of cervix uteri: Secondary | ICD-10-CM

## 2013-11-13 HISTORY — DX: Peptic ulcer, site unspecified, unspecified as acute or chronic, without hemorrhage or perforation: K27.9

## 2013-11-13 LAB — URINE MICROSCOPIC-ADD ON

## 2013-11-13 LAB — WET PREP, GENITAL
Clue Cells Wet Prep HPF POC: NONE SEEN
TRICH WET PREP: NONE SEEN
WBC WET PREP: NONE SEEN
YEAST WET PREP: NONE SEEN

## 2013-11-13 LAB — URINALYSIS, ROUTINE W REFLEX MICROSCOPIC
BILIRUBIN URINE: NEGATIVE
Glucose, UA: NEGATIVE mg/dL
Ketones, ur: NEGATIVE mg/dL
Leukocytes, UA: NEGATIVE
NITRITE: POSITIVE — AB
PROTEIN: NEGATIVE mg/dL
SPECIFIC GRAVITY, URINE: 1.015 (ref 1.005–1.030)
Urobilinogen, UA: 0.2 mg/dL (ref 0.0–1.0)
pH: 5.5 (ref 5.0–8.0)

## 2013-11-13 LAB — CBC WITH DIFFERENTIAL/PLATELET
BASOS ABS: 0 10*3/uL (ref 0.0–0.1)
BASOS PCT: 0 % (ref 0–1)
EOS ABS: 0.1 10*3/uL (ref 0.0–0.7)
EOS PCT: 1 % (ref 0–5)
HEMATOCRIT: 42.9 % (ref 36.0–46.0)
Hemoglobin: 14.6 g/dL (ref 12.0–15.0)
Lymphocytes Relative: 28 % (ref 12–46)
Lymphs Abs: 2.8 10*3/uL (ref 0.7–4.0)
MCH: 30.6 pg (ref 26.0–34.0)
MCHC: 34 g/dL (ref 30.0–36.0)
MCV: 89.9 fL (ref 78.0–100.0)
MONO ABS: 0.5 10*3/uL (ref 0.1–1.0)
Monocytes Relative: 5 % (ref 3–12)
Neutro Abs: 6.6 10*3/uL (ref 1.7–7.7)
Neutrophils Relative %: 66 % (ref 43–77)
PLATELETS: 363 10*3/uL (ref 150–400)
RBC: 4.77 MIL/uL (ref 3.87–5.11)
RDW: 13.5 % (ref 11.5–15.5)
WBC: 10 10*3/uL (ref 4.0–10.5)

## 2013-11-13 LAB — BASIC METABOLIC PANEL
ANION GAP: 13 (ref 5–15)
BUN: 9 mg/dL (ref 6–23)
CALCIUM: 9.6 mg/dL (ref 8.4–10.5)
CO2: 28 meq/L (ref 19–32)
CREATININE: 0.79 mg/dL (ref 0.50–1.10)
Chloride: 104 mEq/L (ref 96–112)
Glucose, Bld: 88 mg/dL (ref 70–99)
Potassium: 4.7 mEq/L (ref 3.7–5.3)
SODIUM: 145 meq/L (ref 137–147)

## 2013-11-13 LAB — PREGNANCY, URINE: Preg Test, Ur: NEGATIVE

## 2013-11-13 LAB — RPR

## 2013-11-13 MED ORDER — HYDROCODONE-ACETAMINOPHEN 5-325 MG PO TABS: ORAL_TABLET | ORAL | Status: DC

## 2013-11-13 MED ORDER — DIPHENHYDRAMINE HCL 25 MG PO CAPS
25.0000 mg | ORAL_CAPSULE | Freq: Once | ORAL | Status: AC
  Administered 2013-11-13: 25 mg via ORAL
  Filled 2013-11-13: qty 1

## 2013-11-13 MED ORDER — DOXYCYCLINE HYCLATE 100 MG PO TABS
100.0000 mg | ORAL_TABLET | Freq: Once | ORAL | Status: AC
  Administered 2013-11-13: 100 mg via ORAL
  Filled 2013-11-13: qty 1

## 2013-11-13 MED ORDER — DOXYCYCLINE HYCLATE 100 MG PO CAPS: 100.0000 mg | ORAL_CAPSULE | Freq: Two times a day (BID) | ORAL | Status: DC

## 2013-11-13 MED ORDER — CEFTRIAXONE SODIUM 250 MG IJ SOLR
250.0000 mg | Freq: Once | INTRAMUSCULAR | Status: AC
  Administered 2013-11-13: 250 mg via INTRAMUSCULAR
  Filled 2013-11-13: qty 250

## 2013-11-13 MED ORDER — DIPHENHYDRAMINE HCL 25 MG PO TABS
25.0000 mg | ORAL_TABLET | Freq: Four times a day (QID) | ORAL | Status: DC
Start: 2013-11-13 — End: 2014-02-07

## 2013-11-13 MED ORDER — LIDOCAINE HCL (PF) 1 % IJ SOLN
INTRAMUSCULAR | Status: AC
  Filled 2013-11-13: qty 5

## 2013-11-13 MED ORDER — OXYCODONE-ACETAMINOPHEN 5-325 MG PO TABS
2.0000 | ORAL_TABLET | Freq: Once | ORAL | Status: AC
  Administered 2013-11-13: 2 via ORAL
  Filled 2013-11-13: qty 2

## 2013-11-13 NOTE — ED Notes (Signed)
Patient with no complaints at this time. Respirations even and unlabored. Skin warm/dry. Discharge instructions reviewed with patient at this time. Patient given opportunity to voice concerns/ask questions. Patient discharged at this time and left Emergency Department with steady gait.   

## 2013-11-13 NOTE — ED Notes (Signed)
Patient came in via EMS. Alert and oriented. Airway patent, ambulatory. Patient c/o lower abd/pelvic pain x1 week. Per patient swelling in lower abd region. Patient reports that she is currently on her period but the pain does not feel like her normal pain. Patient reports increase pain with bending. Per patient possibly exposed to STD.  Patient denies any itching or lesions, unsure of any discharge.

## 2013-11-13 NOTE — Discharge Instructions (Signed)
Pelvic Inflammatory Disease °Pelvic inflammatory disease (PID) refers to an infection in some or all of the female organs. The infection can be in the uterus, ovaries, fallopian tubes, or the surrounding tissues in the pelvis. PID can cause abdominal or pelvic pain that comes on suddenly (acute pelvic pain). PID is a serious infection because it can lead to lasting (chronic) pelvic pain or the inability to have children (infertile).  °CAUSES  °The infection is often caused by the normal bacteria found in the vaginal tissues. PID may also be caused by an infection that is spread during sexual contact. PID can also occur following:  °· The birth of a baby.   °· A miscarriage.   °· An abortion.   °· Major pelvic surgery.   °· The use of an intrauterine device (IUD).   °· A sexual assault.   °RISK FACTORS °Certain factors can put a person at higher risk for PID, such as: °· Being younger than 25 years. °· Being sexually active at a young age. °· Using nonbarrier contraception. °· Having multiple sexual partners. °· Having sex with someone who has symptoms of a genital infection. °· Using oral contraception. °Other times, certain behaviors can increase the possibility of getting PID, such as: °· Having sex during your period. °· Using a vaginal douche. °· Having an intrauterine device (IUD) in place. °SYMPTOMS  °· Abdominal or pelvic pain.   °· Fever.   °· Chills.   °· Abnormal vaginal discharge. °· Abnormal uterine bleeding.   °· Unusual pain shortly after finishing your period. °DIAGNOSIS  °Your caregiver will choose some of the following methods to make a diagnosis, such as:  °· Performing a physical exam and history. A pelvic exam typically reveals a very tender uterus and surrounding pelvis.   °· Ordering laboratory tests including a pregnancy test, blood tests, and urine test.  °· Ordering cultures of the vagina and cervix to check for a sexually transmitted infection (STI). °· Performing an ultrasound.    °· Performing a laparoscopic procedure to look inside the pelvis.   °TREATMENT  °· Antibiotic medicines may be prescribed and taken by mouth.   °· Sexual partners may be treated when the infection is caused by a sexually transmitted disease (STD).   °· Hospitalization may be needed to give antibiotics intravenously. °· Surgery may be needed, but this is rare. °It may take weeks until you are completely well. If you are diagnosed with PID, you should also be checked for human immunodeficiency virus (HIV).   °HOME CARE INSTRUCTIONS  °· If given, take your antibiotics as directed. Finish the medicine even if you start to feel better.   °· Only take over-the-counter or prescription medicines for pain, discomfort, or fever as directed by your caregiver.   °· Do not have sexual intercourse until treatment is completed or as directed by your caregiver. If PID is confirmed, your recent sexual partner(s) will need treatment.   °· Keep your follow-up appointments. °SEEK MEDICAL CARE IF:  °· You have increased or abnormal vaginal discharge.   °· You need prescription medicine for your pain.   °· You vomit.   °· You cannot take your medicines.   °· Your partner has an STD.   °SEEK IMMEDIATE MEDICAL CARE IF:  °· You have a fever.   °· You have increased abdominal or pelvic pain.   °· You have chills.   °· You have pain when you urinate.   °· You are not better after 72 hours following treatment.   °MAKE SURE YOU:  °· Understand these instructions. °· Will watch your condition. °· Will get help right away if you are not doing well or get worse. °  Document Released: 05/02/2005 Document Revised: 08/27/2012 Document Reviewed: 04/28/2011 °Exit[employer]® Patient Information ©2015 [employer]. This information is not intended to replace advice given to you by your health care provider. Make sure you discuss any questions you have with your health care provider. ° °Sexually Transmitted Disease °A sexually transmitted disease (STD) is a  disease or infection often passed to another person during sex. However, STDs can be passed through nonsexual ways. An STD can be passed through: °· Spit (saliva). °· Semen. °· Blood. °· Mucus from the vagina. °· Pee (urine). °HOW CAN I LESSEN MY CHANCES OF GETTING AN STD? °· Use: °¨ Latex condoms. °¨ Water-soluble lubricants with condoms. Do not use petroleum jelly or oils. °¨ Dental dams. These are small pieces of latex that are used as a barrier during oral sex. °· Avoid having more than one sex partner. °· Do not have sex with someone who has other sex partners. °· Do not have sex with anyone you do not know or who is at high risk for an STD. °· Avoid risky sex that can break your skin. °· Do not have sex if you have open sores on your mouth or skin. °· Avoid drinking too much alcohol or taking illegal drugs. Alcohol and drugs can affect your good judgment. °· Avoid oral and anal sex acts. °· Get shots (vaccines) for HPV and hepatitis. °· If you are at risk of being infected with HIV, it is advised that you take a certain medicine daily to prevent HIV infection. This is called pre-exposure prophylaxis (PrEP). You may be at risk if: °¨ You are a man who has sex with other men (MSM). °¨ You are attracted to the opposite sex (heterosexual) and are having sex with more than one partner. °¨ You take drugs with a needle. °¨ You have sex with someone who has HIV. °· Talk with your doctor about if you are at high risk of being infected with HIV. If you begin to take PrEP, get tested for HIV first. Get tested every 3 months for as long as you are taking PrEP. °WHAT SHOULD I DO IF I THINK I HAVE AN STD? °· See your doctor. °· Tell your sex partner(s) that you have an STD. They should be tested and treated. °· Do not have sex until your doctor says it is okay. °WHEN SHOULD I GET HELP? °Get help right away if: °· You have bad belly (abdominal) pain. °· You are a man and have puffiness (swelling) or pain in your  testicles. °· You are a woman and have puffiness in your vagina. °Document Released: 06/09/2004 Document Revised: 05/07/2013 Document Reviewed: 10/26/2012 °ExitCare® Patient Information ©2015 [employer]. This information is not intended to replace advice given to you by your health care provider. Make sure you discuss any questions you have with your health care provider. ° °

## 2013-11-14 LAB — GC/CHLAMYDIA PROBE AMP
CT Probe RNA: NEGATIVE
GC Probe RNA: NEGATIVE

## 2013-11-15 LAB — URINE CULTURE: Colony Count: >=100000

## 2013-11-15 NOTE — ED Provider Notes (Signed)
CSN: [id number]     Arrival date & time 11/13/13  1047 History   First MD Initiated Contact with Patient 11/13/13 1151     Chief Complaint  Patient presents with  . Abdominal Pain  . Exposure to STD     (Consider location/radiation/quality/duration/timing/severity/associated sxs/prior Treatment) Patient is a 23 y.o. female presenting with abdominal pain and STD exposure. The history is provided by the patient.  Abdominal Pain Pain location:  Suprapubic Pain quality: aching and cramping   Pain radiates to:  Does not radiate Pain severity:  Moderate Onset quality:  Gradual Duration:  1 week Timing:  Constant Progression:  Waxing and waning Chronicity:  New Context: recent sexual activity   Context: not trauma   Relieved by:  Nothing Worsened by:  Nothing tried Ineffective treatments:  None tried Associated symptoms: no chest pain, no chills, no diarrhea, no dysuria, no fever, no hematemesis, no hematochezia, no hematuria, no melena, no nausea, no shortness of breath, no sore throat, no vaginal bleeding, no vaginal discharge and no vomiting   Exposure to STD Associated symptoms include abdominal pain. Pertinent negatives include no chest pain, chills, fever, nausea, numbness, rash, sore throat, vomiting or weakness.    Past Medical History  Diagnosis Date  . Bronchitis   . Peptic ulcer    Past Surgical History  Procedure Laterality Date  . Other surgical history      scar tissue removed from right ovary   . Other surgical history      fallopian tube repair  . Wisdom tooth extraction     Family History  Problem Relation Age of Onset  . Cancer Other    History  Substance Use Topics  . Smoking status: Current Every Day Smoker -- 1.00 packs/day for 10 years    Types: Cigarettes  . Smokeless tobacco: Never Used  . Alcohol Use: Yes     Comment: occ   OB History   Grav Para Term Preterm Abortions TAB SAB Ect Mult Living   1    1     0     Review of Systems   Constitutional: Negative for fever, chills and appetite change.  HENT: Negative for sore throat.   Respiratory: Negative for shortness of breath.   Cardiovascular: Negative for chest pain.  Gastrointestinal: Positive for abdominal pain. Negative for nausea, vomiting, diarrhea, blood in stool, melena, hematochezia and hematemesis.  Genitourinary: Positive for pelvic pain. Negative for dysuria, frequency, hematuria, flank pain, decreased urine volume, vaginal bleeding, vaginal discharge, difficulty urinating and genital sores.  Musculoskeletal: Negative for back pain.  Skin: Negative for color change and rash.  Neurological: Negative for dizziness, weakness and numbness.  Hematological: Negative for adenopathy.  All other systems reviewed and are negative.     Allergies  Nsaids and Tylenol  Home Medications   Prior to Admission medications   Medication Sig Start Date End Date Taking? Authorizing Provider  oxyCODONE-acetaminophen (PERCOCET/ROXICET) 5-325 MG per tablet Take 1 tablet by mouth every 6 (six) hours as needed for severe pain.   Yes Historical Provider, MD  diphenhydrAMINE (BENADRYL) 25 MG tablet Take 1 tablet (25 mg total) by mouth every 6 (six) hours. 11/13/13   Kevron Patella L. Piya Mesch, PA-C  doxycycline (VIBRAMYCIN) 100 MG capsule Take 1 capsule (100 mg total) by mouth 2 (two) times daily. For 14 days 11/13/13   Stephaney Steven L. Reza Crymes, PA-C  HYDROcodone-acetaminophen (NORCO/VICODIN) 5-325 MG per tablet Take one-two tabs po q 4-6 hrs prn pain 11/13/13   Fredda Clarida  L. Delonna Ney, PA-C  omeprazole (PRILOSEC) 20 MG capsule Take 1 capsule (20 mg total) by mouth daily. 09/05/13   Glynn OctaveStephen Rancour, MD   BP 111/83  Pulse 87  Temp(Src) 98.4 F (36.9 C) (Oral)  Resp 16  Ht 5\' 2"  (1.575 m)  Wt 140 lb (63.504 kg)  BMI 25.60 kg/m2  SpO2 100%  LMP 11/13/2013 Physical Exam  Nursing note and vitals reviewed. Constitutional: She is oriented to person, place, and time. She appears well-developed and  well-nourished. No distress.  HENT:  Head: Normocephalic and atraumatic.  Mouth/Throat: Oropharynx is clear and moist.  Cardiovascular: Normal rate, regular rhythm, normal heart sounds and intact distal pulses.   No murmur heard. Pulmonary/Chest: Effort normal and breath sounds normal. No respiratory distress.  Abdominal: Soft. Normal appearance and bowel sounds are normal. She exhibits no distension and no mass. There is no hepatosplenomegaly. There is tenderness in the suprapubic area. There is no rebound, no guarding and no CVA tenderness.  Genitourinary: Uterus normal. There is no rash, tenderness or lesion on the right labia. There is no rash, tenderness or lesion on the left labia. Cervix exhibits motion tenderness. Cervix exhibits no discharge. Right adnexum displays no mass and no tenderness. Left adnexum displays no mass and no tenderness. No erythema, tenderness or bleeding around the vagina. No foreign body around the vagina. No vaginal discharge found.  Musculoskeletal: Normal range of motion. She exhibits no edema.  Neurological: She is alert and oriented to person, place, and time. She exhibits normal muscle tone. Coordination normal.  Skin: Skin is warm and dry.    ED Course  Procedures (including critical care time) Labs Review Labs Reviewed  URINALYSIS, ROUTINE W REFLEX MICROSCOPIC - Abnormal; Notable for the following:    Hgb urine dipstick SMALL (*)    Nitrite POSITIVE (*)    All other components within normal limits  URINE MICROSCOPIC-ADD ON - Abnormal; Notable for the following:    Squamous Epithelial / LPF FEW (*)    Bacteria, UA MANY (*)    All other components within normal limits  GC/CHLAMYDIA PROBE AMP  WET PREP, GENITAL  URINE CULTURE  PREGNANCY, URINE  CBC WITH DIFFERENTIAL  BASIC METABOLIC PANEL  RPR    Imaging Review No results found.   EKG Interpretation None      MDM   Final diagnoses:  Cervicitis   Filed Vitals:   11/13/13 1513  BP:  111/83  Pulse: 87  Temp:   Resp: 16    Pt is feeling better and appears stable for d/c.  No concerning sx's for acute abdomen or TOA.  She agrees to treatment with rocephin and zithromax.  i have advised her that if cultures are positive and she continues to have intercourse with untreated partner, that she is likely to exposure herself again.  She verbalized understanding and agrees to return here if sx's are not improving.     Armanie Ullmer L. Trisha Mangleriplett, PA-C 11/15/13 2318

## 2013-11-16 ENCOUNTER — Telehealth (HOSPITAL_BASED_OUTPATIENT_CLINIC_OR_DEPARTMENT_OTHER): Payer: Self-pay | Admitting: Emergency Medicine

## 2013-11-16 NOTE — Telephone Encounter (Signed)
Post ED Visit - Positive Culture Follow-up: Successful Patient Follow-Up  Culture assessed and recommendations reviewed by: []  Wes Dulaney, Pharm.D., BCPS []  Celedonio MiyamotoJeremy Frens, Pharm.D., BCPS []  Georgina PillionElizabeth Martin, Pharm.D., BCPS [x]  [place]merville[place]h Pham, Vermont[place]arm.D., BCPS, AAHIVP []  Estella HuskMichelle Turner, Pharm.D., BCPS, AAHIVP  Positive urine culture  []  Patient discharged without antimicrobial prescription and treatment is now indicated [x]  Organism is resistant to prescribed ED discharge antimicrobial []  Patient with positive blood cultures  Changes discussed with ED provider: Marlon Peliffany Greene PA-C New antibiotic prescription: Macrobid 100 mg PO BID x 5 days    Eliot Popper 11/16/2013, 9:48 AM

## 2013-11-16 NOTE — Progress Notes (Signed)
ED Antimicrobial Stewardship Positive Culture Follow Up   [name] is an 23 y.o. female [facility] with a chief complaint of  Chief Complaint  Patient presents with  . Abdominal Pain  . Exposure to STD    Recent Results (from the past 720 hour(s))  GC/CHLAMYDIA PROBE AMP     Status: None   Collection Time    11/13/13  2:07 PM      Result Value Ref Range Status   CT Probe RNA NEGATIVE  NEGATIVE Final   GC Probe RNA NEGATIVE  NEGATIVE Final   Comment: (NOTE)                                                                                               **Normal Reference Range: Negative**          Assay performed using the Gen-Probe APTIMA COMBO2 (R) Assay.     Acceptable specimen types for this assay include APTIMA Swabs (Unisex,     endocervical, urethral, or vaginal), first void urine, and ThinPrep     liquid based cytology samples.     [facility]  WET PREP, GENITAL     Status: None   Collection Time    11/13/13  2:07 PM      Result Value Ref Range Status   Yeast Wet Prep HPF POC NONE SEEN  NONE SEEN Final   Trich, Wet Prep NONE SEEN  NONE SEEN Final   Clue Cells Wet Prep HPF POC NONE SEEN  NONE SEEN Final   WBC, Wet Prep HPF POC NONE SEEN  NONE SEEN Final  URINE CULTURE     Status: None   Collection Time    11/13/13  2:07 PM      Result Value Ref Range Status   Specimen Description URINE, CLEAN CATCH   Final   Special Requests URINE, CLEAN CATCH   Final   Culture  Setup Time     Final   Value: 11/14/2013 00:37     Performed at [facility]   Colony Count     Final   Value: >=100,000 COLONIES/ML     Performed at [facility]   Culture     Final   Value: ESCHERICHIA COLI     Performed at [facility]   Report Status 11/15/2013 FINAL   Final   Organism ID, Bacteria ESCHERICHIA COLI   Final    [x]  Patient discharged originally without antimicrobial agent and treatment is now indicated   23 yo who came in with symptoms of abd pain and "poss" exposure to STDs. UA wasn't that impressive. She was given a shot of rocephin and sent out on both amoxillin and doxy for the STDs. Her urine culture did come back with ecoli that is resistant to current abx so we'll go ahead and treat.   New antibiotic prescription:   Macrobid 100mg  PO BID x5 days  ED Provider: Marlon Peliffany Greene, PA  Ulyses SouthwardMinh Bettylou [name], PharmD Pager: [phone] Infectious Diseases Pharmacist Phone# [phone]

## 2013-11-17 NOTE — Telephone Encounter (Signed)
Rx for Macrobid 100 mg PO BID x 5 days, prescribed by Marlon Peliffany Greene PA-C, called in to [facility] [phone]) by Norm ParcelShannon Gammons PFM.

## 2013-11-19 NOTE — ED Provider Notes (Signed)
Medical screening examination/treatment/procedure(s) were performed by non-physician practitioner and as supervising physician I was immediately available for consultation/collaboration.   EKG Interpretation None      Dallon Dacosta, MD, FACEP   Ki Corbo L Areona Homer, MD 11/19/13 1502 

## 2014-02-07 ENCOUNTER — Encounter (HOSPITAL_COMMUNITY): Payer: Self-pay | Admitting: Emergency Medicine

## 2014-02-07 ENCOUNTER — Emergency Department (HOSPITAL_COMMUNITY)
Admission: EM | Admit: 2014-02-07 | Discharge: 2014-02-09 | Disposition: A | Discharge status: Other Acute Inpatient Hospital | Payer: Self-pay | Attending: Emergency Medicine | Admitting: Emergency Medicine

## 2014-02-07 DIAGNOSIS — F411 Generalized anxiety disorder: Secondary | ICD-10-CM | POA: Insufficient documentation

## 2014-02-07 DIAGNOSIS — F29 Unspecified psychosis not due to a substance or known physiological condition: Secondary | ICD-10-CM | POA: Insufficient documentation

## 2014-02-07 DIAGNOSIS — Z3202 Encounter for pregnancy test, result negative: Secondary | ICD-10-CM | POA: Insufficient documentation

## 2014-02-07 DIAGNOSIS — R443 Hallucinations, unspecified: Secondary | ICD-10-CM | POA: Insufficient documentation

## 2014-02-07 DIAGNOSIS — Z8709 Personal history of other diseases of the respiratory system: Secondary | ICD-10-CM | POA: Insufficient documentation

## 2014-02-07 DIAGNOSIS — F17200 Nicotine dependence, unspecified, uncomplicated: Secondary | ICD-10-CM | POA: Insufficient documentation

## 2014-02-07 DIAGNOSIS — Z8711 Personal history of peptic ulcer disease: Secondary | ICD-10-CM | POA: Insufficient documentation

## 2014-02-07 DIAGNOSIS — J209 Acute bronchitis, unspecified: Secondary | ICD-10-CM

## 2014-02-07 DIAGNOSIS — Z79899 Other long term (current) drug therapy: Secondary | ICD-10-CM | POA: Insufficient documentation

## 2014-02-07 DIAGNOSIS — F22 Delusional disorders: Secondary | ICD-10-CM | POA: Insufficient documentation

## 2014-02-07 DIAGNOSIS — Z046 Encounter for general psychiatric examination, requested by authority: Secondary | ICD-10-CM | POA: Insufficient documentation

## 2014-02-07 DIAGNOSIS — F329 Major depressive disorder, single episode, unspecified: Secondary | ICD-10-CM | POA: Insufficient documentation

## 2014-02-07 DIAGNOSIS — F3289 Other specified depressive episodes: Secondary | ICD-10-CM | POA: Insufficient documentation

## 2014-02-07 DIAGNOSIS — F1910 Other psychoactive substance abuse, uncomplicated: Secondary | ICD-10-CM | POA: Insufficient documentation

## 2014-02-07 LAB — URINALYSIS, ROUTINE W REFLEX MICROSCOPIC
Bilirubin Urine: NEGATIVE
Glucose, UA: NEGATIVE mg/dL
Hgb urine dipstick: NEGATIVE
Ketones, ur: NEGATIVE mg/dL
LEUKOCYTES UA: NEGATIVE
NITRITE: POSITIVE — AB
PROTEIN: NEGATIVE mg/dL
Specific Gravity, Urine: 1.02 (ref 1.005–1.030)
Urobilinogen, UA: 0.2 mg/dL (ref 0.0–1.0)
pH: 6 (ref 5.0–8.0)

## 2014-02-07 LAB — RAPID URINE DRUG SCREEN, HOSP PERFORMED
Amphetamines: NOT DETECTED
Barbiturates: NOT DETECTED
Benzodiazepines: POSITIVE — AB
Cocaine: NOT DETECTED
OPIATES: NOT DETECTED
TETRAHYDROCANNABINOL: POSITIVE — AB

## 2014-02-07 LAB — COMPREHENSIVE METABOLIC PANEL
ALK PHOS: 79 U/L (ref 39–117)
ALT: 23 U/L (ref 0–35)
AST: 18 U/L (ref 0–37)
Albumin: 4.3 g/dL (ref 3.5–5.2)
Anion gap: 14 (ref 5–15)
BILIRUBIN TOTAL: 0.6 mg/dL (ref 0.3–1.2)
BUN: 8 mg/dL (ref 6–23)
CO2: 24 mEq/L (ref 19–32)
CREATININE: 0.8 mg/dL (ref 0.50–1.10)
Calcium: 9.3 mg/dL (ref 8.4–10.5)
Chloride: 103 mEq/L (ref 96–112)
GFR calc Af Amer: >90 mL/min (ref >90)
GFR calc non Af Amer: >90 mL/min (ref >90)
Glucose, Bld: 96 mg/dL (ref 70–99)
Potassium: 3.6 mEq/L — ABNORMAL LOW (ref 3.7–5.3)
Sodium: 141 mEq/L (ref 137–147)
Total Protein: 7.5 g/dL (ref 6.0–8.3)

## 2014-02-07 LAB — ETHANOL

## 2014-02-07 LAB — CBC WITH DIFFERENTIAL/PLATELET
Basophils Absolute: 0.1 10*3/uL (ref 0.0–0.1)
Basophils Relative: 1 % (ref 0–1)
Eosinophils Absolute: 0.1 10*3/uL (ref 0.0–0.7)
Eosinophils Relative: 1 % (ref 0–5)
HEMATOCRIT: 40.5 % (ref 36.0–46.0)
HEMOGLOBIN: 14.1 g/dL (ref 12.0–15.0)
LYMPHS ABS: 3.7 10*3/uL (ref 0.7–4.0)
Lymphocytes Relative: 44 % (ref 12–46)
MCH: 30.5 pg (ref 26.0–34.0)
MCHC: 34.8 g/dL (ref 30.0–36.0)
MCV: 87.7 fL (ref 78.0–100.0)
MONO ABS: 0.5 10*3/uL (ref 0.1–1.0)
MONOS PCT: 6 % (ref 3–12)
NEUTROS ABS: 4 10*3/uL (ref 1.7–7.7)
Neutrophils Relative %: 48 % (ref 43–77)
Platelets: 285 10*3/uL (ref 150–400)
RBC: 4.62 MIL/uL (ref 3.87–5.11)
RDW: 13.5 % (ref 11.5–15.5)
WBC: 8.3 10*3/uL (ref 4.0–10.5)

## 2014-02-07 LAB — URINE MICROSCOPIC-ADD ON

## 2014-02-07 LAB — POC URINE PREG, ED: PREG TEST UR: NEGATIVE

## 2014-02-07 MED ORDER — NICOTINE 21 MG/24HR TD PT24
MEDICATED_PATCH | TRANSDERMAL | Status: AC
  Administered 2014-02-08: 21 mg
  Filled 2014-02-07: qty 1

## 2014-02-07 MED ORDER — LORAZEPAM 1 MG PO TABS
1.0000 mg | ORAL_TABLET | Freq: Once | ORAL | Status: AC
  Administered 2014-02-07: 1 mg via ORAL
  Filled 2014-02-07: qty 1

## 2014-02-07 MED ORDER — ALUM & MAG HYDROXIDE-SIMETH 200-200-20 MG/5ML PO SUSP
30.0000 mL | Freq: Once | ORAL | Status: AC
  Administered 2014-02-07: 30 mL via ORAL
  Filled 2014-02-07: qty 30

## 2014-02-07 MED ORDER — NICOTINE 14 MG/24HR TD PT24
14.0000 mg | MEDICATED_PATCH | Freq: Once | TRANSDERMAL | Status: AC
  Administered 2014-02-07: 14 mg via TRANSDERMAL
  Filled 2014-02-07: qty 1

## 2014-02-07 MED ORDER — DIAZEPAM 5 MG PO TABS
5.0000 mg | ORAL_TABLET | Freq: Once | ORAL | Status: AC
  Administered 2014-02-07: 5 mg via ORAL
  Filled 2014-02-07: qty 1

## 2014-02-07 MED ORDER — DIPHENHYDRAMINE HCL 25 MG PO CAPS
25.0000 mg | ORAL_CAPSULE | Freq: Once | ORAL | Status: AC
  Administered 2014-02-07: 25 mg via ORAL
  Filled 2014-02-07: qty 1

## 2014-02-07 NOTE — ED Provider Notes (Signed)
CSN: [id number]     Arrival date & time 02/07/14  1330 History   First MD Initiated Contact with Patient 02/07/14 1404     Chief Complaint  Patient presents with  . V70.1     (Consider location/radiation/quality/duration/timing/severity/associated sxs/prior Treatment) HPI Comments: Patient presents to ER for evaluation of hearing voices. Patient reports that she has been hallucinating for the last 2 weeks. Patient is accompanied by her aunt who reports that the patient has been very paranoid and delusional. Patient has been hearing voices when she lies in bed at night. She denies that the voices are giving her any specific commands. Patient admits to smoking meth approximately 3 weeks ago. She reports that was the first time she ever used meth and hasn't used any since. She reports that was the first time she ever used now and hasn't used any since.  Patient admits that she is hearing voices. Family does note that she has been very paranoid. She feels that people are breaking into her house. There have been progressively worsening and she is extremely anxious. Patient to sadness and depression. She was treated with Prozac a couple of years ago, currently off all meds. She denies homicidality and suicidality.   Patient denies alcohol abuse. She does use Xanax and pain pills off the street.   Past Medical History  Diagnosis Date  . Bronchitis   . Peptic ulcer    Past Surgical History  Procedure Laterality Date  . Other surgical history      scar tissue removed from right ovary   . Other surgical history      fallopian tube repair  . Wisdom tooth extraction    . Tubal ligation     Family History  Problem Relation Age of Onset  . Cancer Other    History  Substance Use Topics  . Smoking status: Current Every Day Smoker -- 1.00 packs/day for 10 years    Types: Cigarettes  . Smokeless tobacco: Never Used  . Alcohol Use: Yes     Comment: occ   OB History   Grav Para Term Preterm  Abortions TAB SAB Ect Mult Living   1    1     0     Review of Systems  Psychiatric/Behavioral: Positive for hallucinations and dysphoric mood. Negative for suicidal ideas. The patient is nervous/anxious.   All other systems reviewed and are negative.     Allergies  Nsaids and Tylenol  Home Medications   Prior to Admission medications   Medication Sig Start Date End Date Taking? Authorizing Provider  diphenhydrAMINE (BENADRYL) 25 MG tablet Take 1 tablet (25 mg total) by mouth every 6 (six) hours. 11/13/13   Tammy L. Triplett, PA-C  doxycycline (VIBRAMYCIN) 100 MG capsule Take 1 capsule (100 mg total) by mouth 2 (two) times daily. For 14 days 11/13/13   Tammy L. Triplett, PA-C  HYDROcodone-acetaminophen (NORCO/VICODIN) 5-325 MG per tablet Take one-two tabs po q 4-6 hrs prn pain 11/13/13   Tammy L. Triplett, PA-C  omeprazole (PRILOSEC) 20 MG capsule Take 1 capsule (20 mg total) by mouth daily. 09/05/13   Glynn Octave, MD  oxyCODONE-acetaminophen (PERCOCET/ROXICET) 5-325 MG per tablet Take 1 tablet by mouth every 6 (six) hours as needed for severe pain.    Historical Provider, MD   BP 130/90  Pulse 108  Temp(Src) 98.1 F (36.7 C) (Oral)  Resp 16  Ht  (1.575 m)  Wt 130 lb 9 oz (59.223 kg)  BMI 23.87  kg/m2  SpO2 99%  LMP 02/01/2014 Physical Exam  Constitutional: She is oriented to person, place, and time. She appears well-developed and well-nourished. No distress.  HENT:  Head: Normocephalic and atraumatic.  Right Ear: Hearing normal.  Left Ear: Hearing normal.  Nose: Nose normal.  Mouth/Throat: Oropharynx is clear and moist and mucous membranes are normal.  Eyes: Conjunctivae and EOM are normal. Pupils are equal, round, and reactive to light.  Neck: Normal range of motion. Neck supple.  Cardiovascular: Regular rhythm, S1 normal and S2 normal.  Exam reveals no gallop and no friction rub.   No murmur heard. Pulmonary/Chest: Effort normal and breath sounds normal. No  respiratory distress. She exhibits no tenderness.  Abdominal: Soft. Normal appearance and bowel sounds are normal. There is no hepatosplenomegaly. There is no tenderness. There is no rebound, no guarding, no tenderness at McBurney's point and negative Murphy's sign. No hernia.  Musculoskeletal: Normal range of motion.  Neurological: She is alert and oriented to person, place, and time. She has normal strength. No cranial nerve deficit or sensory deficit. Coordination normal. GCS eye subscore is 4. GCS verbal subscore is 5. GCS motor subscore is 6.  Skin: Skin is warm, dry and intact. No rash noted. No cyanosis.  Psychiatric: Her behavior is normal. Her mood appears anxious. Her speech is delayed. Thought content is paranoid. She exhibits a depressed mood.    ED Course  Procedures (including critical care time) Labs Review Labs Reviewed  CBC WITH DIFFERENTIAL  COMPREHENSIVE METABOLIC PANEL  URINE RAPID DRUG SCREEN (HOSP PERFORMED)  ETHANOL  URINALYSIS, ROUTINE W REFLEX MICROSCOPIC    Imaging Review No results found.   EKG Interpretation None      MDM   Final diagnoses:  None   acute psychosis  Polysubstance drug abuse  Patient presents to the ER for evaluation of hallucinations. The patient admits to hearing voices. Family is concerned about her behavior, she has been paranoid and delusional. Patient is not actively homicidal or suicidal, however. Patient admits to multiple drug use. Symptoms seem to have started after she tried methamphetamine for the first time. Patient here in the ER voluntarily. Endoscopy as well to evaluate her for further management of acute psychosis, possibly drug induced.    Gilda Crease, MD 02/07/14 1538

## 2014-02-07 NOTE — ED Notes (Signed)
IVC paperwork filed. AC and Press photographer notified.

## 2014-02-07 NOTE — ED Notes (Addendum)
Requesting nicotine patch back on. Will use left arm this time and will remove if any itching developes

## 2014-02-07 NOTE — ED Notes (Signed)
Pt's aunt is Serita Grammes. If anyone has any questions, call her at [phone]

## 2014-02-07 NOTE — ED Notes (Signed)
Pt here with Aunt after pt has been dealing with seeing people who aren't there and hearing people who arent there. Denies si/hi. Pt reports , " voices tell me they are coming to get me" pt reports using Meth appox 3 weeks.

## 2014-02-07 NOTE — ED Notes (Signed)
Pt c/o of itching on right arm distal to nicotine patch. No rash visible to me, patch removed to see if itching resolves.

## 2014-02-07 NOTE — BH Assessment (Signed)
Spoke with Dr. Adriana Simas who said Pt is having new onset of paranoia and hallucinations. Tele-assessment will be initiated.  Harlin Rain Ria Comment, [facility] Triage Specialist [phone]

## 2014-02-07 NOTE — ED Notes (Signed)
Pt placed in wine scrubs and pt belongings placed in psych lockers.

## 2014-02-07 NOTE — ED Provider Notes (Signed)
Discussed with behavioral health consultant. Patient will need to be admitted to a psychiatric unit. However, [facility] is full at the moment.  Patient has been involuntarily committed  Donnetta Hutching, MD 02/07/14 [phone]

## 2014-02-07 NOTE — ED Notes (Signed)
Pt served with IVC papers from [employer]

## 2014-02-07 NOTE — ED Notes (Addendum)
Now c/o itching total body. Requesting more benadryl.

## 2014-02-07 NOTE — ED Notes (Addendum)
Pt surprised that her drug screen did not show opiates. States she last took percocet yesterday. Takes valium on a regular basis, last meth use 3 weeks ago

## 2014-02-07 NOTE — ED Notes (Signed)
C/o mid abd pain and requesting hydrocodone. I explained that vicodin could be an irritant to the stomach and perhaps maalox would be a better medication. Pt agreed to same.

## 2014-02-07 NOTE — BH Assessment (Signed)
Assessment complete. Rosey Bath, [facility] at [facility], confirms adult unit is currently at capacity. Pt has been petitioned for IVC. Maryjean Morn, PA-C agrees Pt meets criteria for inpatient dual diagnosis treatment. TTS will contact other facilities. Notified Dr. Adriana Simas and Venetia Night, RN of recommendation.  Harlin Rain Ria Comment, [facility] Triage Specialist [phone]

## 2014-02-08 MED ORDER — CEPHALEXIN 500 MG PO CAPS
500.0000 mg | ORAL_CAPSULE | Freq: Three times a day (TID) | ORAL | Status: DC
  Administered 2014-02-08 – 2014-02-09 (×4): 500 mg via ORAL
  Filled 2014-02-08 (×4): qty 1

## 2014-02-08 MED ORDER — ALBUTEROL SULFATE (2.5 MG/3ML) 0.083% IN NEBU
5.0000 mg | INHALATION_SOLUTION | Freq: Once | RESPIRATORY_TRACT | Status: AC
  Administered 2014-02-08: 5 mg via RESPIRATORY_TRACT
  Filled 2014-02-08: qty 6

## 2014-02-08 MED ORDER — ALUM & MAG HYDROXIDE-SIMETH 200-200-20 MG/5ML PO SUSP
15.0000 mL | Freq: Once | ORAL | Status: AC
  Administered 2014-02-08: 15 mL via ORAL

## 2014-02-08 MED ORDER — IPRATROPIUM BROMIDE 0.02 % IN SOLN
0.5000 mg | Freq: Once | RESPIRATORY_TRACT | Status: AC
  Administered 2014-02-08: 0.5 mg via RESPIRATORY_TRACT
  Filled 2014-02-08: qty 2.5

## 2014-02-08 MED ORDER — NICOTINE 21 MG/24HR TD PT24
MEDICATED_PATCH | TRANSDERMAL | Status: AC
  Filled 2014-02-08: qty 1

## 2014-02-08 MED ORDER — LORAZEPAM 1 MG PO TABS
1.0000 mg | ORAL_TABLET | Freq: Two times a day (BID) | ORAL | Status: DC | PRN
  Administered 2014-02-08 – 2014-02-09 (×2): 1 mg via ORAL
  Filled 2014-02-08 (×2): qty 1

## 2014-02-08 MED ORDER — HYDROXYZINE HCL 25 MG PO TABS
50.0000 mg | ORAL_TABLET | Freq: Once | ORAL | Status: AC
  Administered 2014-02-08: 50 mg via ORAL

## 2014-02-08 MED ORDER — HYDROXYZINE HCL 25 MG PO TABS
50.0000 mg | ORAL_TABLET | Freq: Four times a day (QID) | ORAL | Status: DC | PRN
  Administered 2014-02-08 – 2014-02-09 (×3): 50 mg via ORAL
  Filled 2014-02-08 (×4): qty 2

## 2014-02-08 MED ORDER — ALBUTEROL SULFATE HFA 108 (90 BASE) MCG/ACT IN AERS
1.0000 | INHALATION_SPRAY | RESPIRATORY_TRACT | Status: DC | PRN
  Administered 2014-02-08 – 2014-02-09 (×2): 2 via RESPIRATORY_TRACT
  Filled 2014-02-08 (×2): qty 6.7

## 2014-02-08 MED ORDER — ALUM & MAG HYDROXIDE-SIMETH 200-200-20 MG/5ML PO SUSP
ORAL | Status: AC
Start: 2014-02-08 — End: 2014-02-09
  Filled 2014-02-08: qty 30

## 2014-02-08 MED ORDER — LORAZEPAM 1 MG PO TABS
1.0000 mg | ORAL_TABLET | Freq: Once | ORAL | Status: AC
  Administered 2014-02-08: 1 mg via ORAL
  Filled 2014-02-08: qty 1

## 2014-02-08 NOTE — BH Assessment (Signed)
Received call from [facility] stating Pt is declined due to history of seizures.  Harlin Rain Ria Comment, [facility] Triage Specialist [phone]
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
## 2014-02-08 NOTE — ED Notes (Signed)
Again requesting valium for her nerves. Dr Adriana Simas aware

## 2014-02-08 NOTE — ED Notes (Addendum)
Phone call received from pt's mom, Gwendolyn. She is not able to come to ED secondary to a disability but would like to be involved in her daughters care. Is available at the home number 24/7.

## 2014-02-08 NOTE — ED Provider Notes (Signed)
Aunt at her bedside. Patient sleeping. When she was awakened she states she is having trouble breathing. She states she's feeling better mentally. She also complains of chronic lower abdominal pain from "female problems".review of her labs shows she does have a possible UTI. Patient started on cephalexin. [facility]SS has been working on placement.  Patient was sleeping in no distress. However when I listen to her lungs she has diffuse mid pitched wheezing in all lung fields. Albuterol/Atrovent nebulizer was ordered.  Albuterol inhaler ordered on PRN basis.   Filed Vitals:   02/08/14 1603  BP: 125/82  Pulse: 82  Temp: 97.7 F (36.5 C)  Resp: 20      Devoria Albe, MD, FACEP  Ward Givens, MD 02/08/14 [phone]

## 2014-02-08 NOTE — Progress Notes (Signed)
MHT contacted the following psychiatric facilities for inpatient placement:  BED AVAILABLE, FAXED REFERRAL FOR CONSIDERATION  [facility]  AT [facility]AC[facility]  [facility] [place] [facility]  Blain Pais, MHT/NS

## 2014-02-08 NOTE — ED Notes (Signed)
Pt now requesting something to help her sleep. States valium "just keeps me awake". States trazodone worked well for her before. I consulted with ERMD and plan now is to not add any further medications. Pt is aware of this. I explained that since she has been having episodes of hallucinations that until we have a better feel for what may be causing them, that adding sedation type medication my just confuse the diagnosis.

## 2014-02-08 NOTE — ED Notes (Signed)
Sleeping, respirations regular, skin color WNL.

## 2014-02-08 NOTE — ED Notes (Signed)
Faxed Papers from Magistrate at [phone] to [facility]

## 2014-02-08 NOTE — BH Assessment (Signed)
Becky [name], AC at [facility], confirmed adult unit is at capacity. Contacted the following facilities for placement:   BED AVAILABLE, FAXED CLINICAL INFORMATION:  [facility], per Jackie  [facility], per Lisa  [facility], per Sharita   AT [facility]:  [facility], per Margaret  [facility], per Elva  [facility], per Leah  [facility], per Shay  [facility], per Kathy  [facility], per Jane  [facility], per Mavis  [facility], per Melanie  [facility], per Sherry  [facility], per Kim  [facility], per Denise  [facility], per Peggy   MULTIPLE CALL WITH NO RESPONSE:  [facility]  [facility]  [facility]   PT DECLINED:  [employer] (substance abuse)    Danayah Smyre Ellis Azalia Neuberger Jr, LPC, NCC  Triage Specialist  [phone]  

## 2014-02-09 ENCOUNTER — Emergency Department (HOSPITAL_COMMUNITY): Payer: Self-pay

## 2014-02-09 MED ORDER — CEPHALEXIN 500 MG PO CAPS: 500.0000 mg | ORAL_CAPSULE | Freq: Three times a day (TID) | ORAL | Status: DC

## 2014-02-09 MED ORDER — PREDNISONE 50 MG PO TABS
60.0000 mg | ORAL_TABLET | Freq: Once | ORAL | Status: AC
  Administered 2014-02-09: 60 mg via ORAL
  Filled 2014-02-09 (×2): qty 1

## 2014-02-09 MED ORDER — IPRATROPIUM BROMIDE 0.02 % IN SOLN
0.5000 mg | Freq: Once | RESPIRATORY_TRACT | Status: AC
  Administered 2014-02-09: 0.5 mg via RESPIRATORY_TRACT
  Filled 2014-02-09: qty 2.5

## 2014-02-09 MED ORDER — NICOTINE 21 MG/24HR TD PT24
MEDICATED_PATCH | TRANSDERMAL | Status: AC
  Administered 2014-02-09: 21 mg
  Filled 2014-02-09: qty 1

## 2014-02-09 MED ORDER — ALBUTEROL SULFATE (2.5 MG/3ML) 0.083% IN NEBU
5.0000 mg | INHALATION_SOLUTION | Freq: Once | RESPIRATORY_TRACT | Status: AC
  Administered 2014-02-09: 5 mg via RESPIRATORY_TRACT
  Filled 2014-02-09: qty 6

## 2014-02-09 NOTE — ED Notes (Signed)
Patient transported to X-ray 

## 2014-02-09 NOTE — ED Notes (Signed)
Pt placed to [facility]. Dr. Regino Schultze accepting. To call report 8am 02/09/2014. [phone] for nurse report.

## 2014-02-09 NOTE — ED Notes (Signed)
[facility] called for pt transportation.
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
## 2014-02-09 NOTE — ED Notes (Signed)
Patient states that she can not breath. Feels like she needs another breathing treatment.

## 2014-02-09 NOTE — Discharge Instructions (Signed)
Take the antibiotic until gone for your urinary tract infection and bronchitis. Use your inhaler 2 puffs every 4 -6 hours as needed for wheezing or shortness of breath.

## 2014-02-09 NOTE — ED Provider Notes (Signed)
PT has been accepted at [facility] in [place] by Dr Regino Schultze.   Pt states she is still feeling SOB. Has diffuse rhonchi and is coughing. Has not had CXR, states she has used her inhaler but still feels SOB. Nebulizer ordered. Patient already started on Keflex for possible UTI. She received one dose of steroids in the ED however I am not going to continue her steroids due to her psychosis  Medications  cephALEXin (KEFLEX) capsule 500 mg (500 mg Oral Given 02/09/14 0543)  albuterol (PROVENTIL HFA;VENTOLIN HFA) 108 (90 BASE) MCG/ACT inhaler 1-2 puff (2 puffs Inhalation Given 02/09/14 0828)  ipratropium (ATROVENT) nebulizer solution 0.5 mg (0.5 mg Nebulization Given 02/08/14 0955)  albuterol (PROVENTIL) (2.5 MG/3ML) 0.083% nebulizer solution 5 mg (5 mg Nebulization Given 02/08/14 0954)  albuterol (PROVENTIL) (2.5 MG/3ML) 0.083% nebulizer solution 5 mg (5 mg Nebulization Given 02/09/14 1021)  ipratropium (ATROVENT) nebulizer solution 0.5 mg (0.5 mg Nebulization Given 02/09/14 1021)  predniSONE (DELTASONE) tablet 60 mg (60 mg Oral Given 02/09/14 0921)     Recheck 11:50 lungs are clear, she states she feels better.    Plan discharge  Diagnoses that have been ruled out:  None  Diagnoses that are still under consideration:  None  Final diagnoses:  Psychosis, unspecified psychosis type   Devoria Albe, MD, Armando Gang   Ward Givens, MD 02/09/14 1154

## 2014-03-03 ENCOUNTER — Inpatient Hospital Stay (HOSPITAL_COMMUNITY)
Admission: AD | Admit: 2014-03-03 | Discharge: 2014-03-12 | DRG: 885 | Disposition: A | Discharge status: Home / Self Care | Payer: 59 | Source: Intra-hospital | Attending: Psychiatry | Admitting: Psychiatry

## 2014-03-03 ENCOUNTER — Emergency Department (HOSPITAL_COMMUNITY)
Admission: EM | Admit: 2014-03-03 | Discharge: 2014-03-03 | Disposition: A | Discharge status: Home / Self Care | Payer: Self-pay | Attending: Emergency Medicine | Admitting: Emergency Medicine

## 2014-03-03 ENCOUNTER — Encounter (HOSPITAL_COMMUNITY): Payer: Self-pay | Admitting: Emergency Medicine

## 2014-03-03 DIAGNOSIS — Z79899 Other long term (current) drug therapy: Secondary | ICD-10-CM | POA: Insufficient documentation

## 2014-03-03 DIAGNOSIS — F29 Unspecified psychosis not due to a substance or known physiological condition: Secondary | ICD-10-CM

## 2014-03-03 DIAGNOSIS — Z91410 Personal history of adult physical and sexual abuse: Secondary | ICD-10-CM | POA: Diagnosis not present

## 2014-03-03 DIAGNOSIS — F19951 Other psychoactive substance use, unspecified with psychoactive substance-induced psychotic disorder with hallucinations: Secondary | ICD-10-CM

## 2014-03-03 DIAGNOSIS — G4700 Insomnia, unspecified: Secondary | ICD-10-CM | POA: Diagnosis present

## 2014-03-03 DIAGNOSIS — F1220 Cannabis dependence, uncomplicated: Secondary | ICD-10-CM

## 2014-03-03 DIAGNOSIS — F4310 Post-traumatic stress disorder, unspecified: Secondary | ICD-10-CM

## 2014-03-03 DIAGNOSIS — R441 Visual hallucinations: Secondary | ICD-10-CM | POA: Insufficient documentation

## 2014-03-03 DIAGNOSIS — F17210 Nicotine dependence, cigarettes, uncomplicated: Secondary | ICD-10-CM | POA: Diagnosis present

## 2014-03-03 DIAGNOSIS — R440 Auditory hallucinations: Secondary | ICD-10-CM | POA: Insufficient documentation

## 2014-03-03 DIAGNOSIS — F1290 Cannabis use, unspecified, uncomplicated: Secondary | ICD-10-CM | POA: Diagnosis present

## 2014-03-03 DIAGNOSIS — K219 Gastro-esophageal reflux disease without esophagitis: Secondary | ICD-10-CM | POA: Diagnosis present

## 2014-03-03 DIAGNOSIS — E785 Hyperlipidemia, unspecified: Secondary | ICD-10-CM | POA: Diagnosis present

## 2014-03-03 DIAGNOSIS — Z23 Encounter for immunization: Secondary | ICD-10-CM | POA: Diagnosis not present

## 2014-03-03 DIAGNOSIS — Z3202 Encounter for pregnancy test, result negative: Secondary | ICD-10-CM | POA: Insufficient documentation

## 2014-03-03 DIAGNOSIS — F1110 Opioid abuse, uncomplicated: Secondary | ICD-10-CM | POA: Diagnosis present

## 2014-03-03 DIAGNOSIS — F312 Bipolar disorder, current episode manic severe with psychotic features: Secondary | ICD-10-CM | POA: Diagnosis present

## 2014-03-03 DIAGNOSIS — F1310 Sedative, hypnotic or anxiolytic abuse, uncomplicated: Secondary | ICD-10-CM | POA: Diagnosis present

## 2014-03-03 DIAGNOSIS — F1994 Other psychoactive substance use, unspecified with psychoactive substance-induced mood disorder: Secondary | ICD-10-CM

## 2014-03-03 DIAGNOSIS — F419 Anxiety disorder, unspecified: Secondary | ICD-10-CM | POA: Diagnosis present

## 2014-03-03 DIAGNOSIS — Z8719 Personal history of other diseases of the digestive system: Secondary | ICD-10-CM | POA: Insufficient documentation

## 2014-03-03 DIAGNOSIS — Z720 Tobacco use: Secondary | ICD-10-CM | POA: Insufficient documentation

## 2014-03-03 DIAGNOSIS — Z8709 Personal history of other diseases of the respiratory system: Secondary | ICD-10-CM | POA: Insufficient documentation

## 2014-03-03 DIAGNOSIS — R000 Tachycardia, unspecified: Secondary | ICD-10-CM | POA: Insufficient documentation

## 2014-03-03 DIAGNOSIS — F1320 Sedative, hypnotic or anxiolytic dependence, uncomplicated: Secondary | ICD-10-CM

## 2014-03-03 DIAGNOSIS — F1520 Other stimulant dependence, uncomplicated: Secondary | ICD-10-CM

## 2014-03-03 HISTORY — DX: Bipolar disorder, unspecified: F31.9

## 2014-03-03 LAB — CBC WITH DIFFERENTIAL/PLATELET
BASOS PCT: 0 % (ref 0–1)
Basophils Absolute: 0 10*3/uL (ref 0.0–0.1)
Eosinophils Absolute: 0 10*3/uL (ref 0.0–0.7)
Eosinophils Relative: 0 % (ref 0–5)
HEMATOCRIT: 36.7 % (ref 36.0–46.0)
HEMOGLOBIN: 12.8 g/dL (ref 12.0–15.0)
LYMPHS ABS: 1.9 10*3/uL (ref 0.7–4.0)
LYMPHS PCT: 20 % (ref 12–46)
MCH: 30.6 pg (ref 26.0–34.0)
MCHC: 34.9 g/dL (ref 30.0–36.0)
MCV: 87.8 fL (ref 78.0–100.0)
MONO ABS: 0.7 10*3/uL (ref 0.1–1.0)
MONOS PCT: 8 % (ref 3–12)
Neutro Abs: 7 10*3/uL (ref 1.7–7.7)
Neutrophils Relative %: 72 % (ref 43–77)
Platelets: 281 10*3/uL (ref 150–400)
RBC: 4.18 MIL/uL (ref 3.87–5.11)
RDW: 13.8 % (ref 11.5–15.5)
WBC: 9.6 10*3/uL (ref 4.0–10.5)

## 2014-03-03 LAB — URINALYSIS, ROUTINE W REFLEX MICROSCOPIC
BILIRUBIN URINE: NEGATIVE
Glucose, UA: NEGATIVE mg/dL
Hgb urine dipstick: NEGATIVE
KETONES UR: NEGATIVE mg/dL
Leukocytes, UA: NEGATIVE
Nitrite: NEGATIVE
PH: 7 (ref 5.0–8.0)
Protein, ur: NEGATIVE mg/dL
Specific Gravity, Urine: 1.01 (ref 1.005–1.030)
Urobilinogen, UA: 0.2 mg/dL (ref 0.0–1.0)

## 2014-03-03 LAB — COMPREHENSIVE METABOLIC PANEL
ALT: 15 U/L (ref 0–35)
ANION GAP: 14 (ref 5–15)
AST: 15 U/L (ref 0–37)
Albumin: 4 g/dL (ref 3.5–5.2)
Alkaline Phosphatase: 69 U/L (ref 39–117)
BILIRUBIN TOTAL: 0.3 mg/dL (ref 0.3–1.2)
BUN: 11 mg/dL (ref 6–23)
CHLORIDE: 102 meq/L (ref 96–112)
CO2: 24 meq/L (ref 19–32)
CREATININE: 0.63 mg/dL (ref 0.50–1.10)
Calcium: 9.1 mg/dL (ref 8.4–10.5)
GFR calc Af Amer: >90 mL/min (ref >90)
GFR calc non Af Amer: >90 mL/min (ref >90)
Glucose, Bld: 119 mg/dL — ABNORMAL HIGH (ref 70–99)
Potassium: 4 mEq/L (ref 3.7–5.3)
Sodium: 140 mEq/L (ref 137–147)
Total Protein: 7.4 g/dL (ref 6.0–8.3)

## 2014-03-03 LAB — RAPID URINE DRUG SCREEN, HOSP PERFORMED
AMPHETAMINES: NOT DETECTED
Barbiturates: NOT DETECTED
Benzodiazepines: POSITIVE — AB
COCAINE: NOT DETECTED
OPIATES: NOT DETECTED
TETRAHYDROCANNABINOL: POSITIVE — AB

## 2014-03-03 LAB — ETHANOL: Alcohol, Ethyl (B): <11 mg/dL (ref 0–11)

## 2014-03-03 LAB — VALPROIC ACID LEVEL: VALPROIC ACID LVL: 35.7 ug/mL — AB (ref 50.0–100.0)

## 2014-03-03 LAB — PREGNANCY, URINE: Preg Test, Ur: NEGATIVE

## 2014-03-03 MED ORDER — ZIPRASIDONE MESYLATE 20 MG IM SOLR
10.0000 mg | Freq: Once | INTRAMUSCULAR | Status: AC
  Administered 2014-03-03: 10 mg via INTRAMUSCULAR
  Filled 2014-03-03: qty 20

## 2014-03-03 MED ORDER — QUETIAPINE FUMARATE 100 MG PO TABS
200.0000 mg | ORAL_TABLET | Freq: Every day | ORAL | Status: DC
  Administered 2014-03-03: 200 mg via ORAL
  Filled 2014-03-03 (×3): qty 2

## 2014-03-03 MED ORDER — ZOLPIDEM TARTRATE 5 MG PO TABS: 10.0000 mg | ORAL_TABLET | Freq: Every evening | ORAL | Status: DC | PRN

## 2014-03-03 MED ORDER — QUETIAPINE FUMARATE 100 MG PO TABS
100.0000 mg | ORAL_TABLET | Freq: Every day | ORAL | Status: DC
Start: 2014-03-04 — End: 2014-03-04
  Administered 2014-03-04: 100 mg via ORAL
  Filled 2014-03-03 (×2): qty 1

## 2014-03-03 MED ORDER — DIVALPROEX SODIUM 250 MG PO DR TAB
500.0000 mg | DELAYED_RELEASE_TABLET | Freq: Two times a day (BID) | ORAL | Status: DC
  Administered 2014-03-03: 500 mg via ORAL
  Filled 2014-03-03: qty 2

## 2014-03-03 MED ORDER — HALOPERIDOL LACTATE 5 MG/ML IJ SOLN: 5.0000 mg | Freq: Four times a day (QID) | INTRAMUSCULAR | Status: DC | PRN

## 2014-03-03 MED ORDER — QUETIAPINE FUMARATE 200 MG PO TABS
200.0000 mg | ORAL_TABLET | Freq: Every day | ORAL | Status: DC
  Filled 2014-03-03: qty 1

## 2014-03-03 MED ORDER — LORAZEPAM 1 MG PO TABS
1.0000 mg | ORAL_TABLET | Freq: Three times a day (TID) | ORAL | Status: DC | PRN
  Administered 2014-03-03: 1 mg via ORAL
  Filled 2014-03-03: qty 1

## 2014-03-03 MED ORDER — MAGNESIUM HYDROXIDE 400 MG/5ML PO SUSP: 30.0000 mL | Freq: Every day | ORAL | Status: DC | PRN

## 2014-03-03 MED ORDER — ALUM & MAG HYDROXIDE-SIMETH 200-200-20 MG/5ML PO SUSP: 30.0000 mL | ORAL | Status: DC | PRN

## 2014-03-03 MED ORDER — STERILE WATER FOR INJECTION IJ SOLN
INTRAMUSCULAR | Status: AC
  Filled 2014-03-03: qty 10

## 2014-03-03 MED ORDER — HYDROXYZINE HCL 25 MG PO TABS: 100.0000 mg | ORAL_TABLET | Freq: Every evening | ORAL | Status: DC | PRN

## 2014-03-03 MED ORDER — LORAZEPAM 1 MG PO TABS
2.0000 mg | ORAL_TABLET | Freq: Four times a day (QID) | ORAL | Status: DC | PRN
  Administered 2014-03-03: 2 mg via ORAL
  Filled 2014-03-03: qty 2

## 2014-03-03 MED ORDER — QUETIAPINE FUMARATE 100 MG PO TABS
100.0000 mg | ORAL_TABLET | Freq: Every day | ORAL | Status: DC
  Administered 2014-03-03: 100 mg via ORAL
  Filled 2014-03-03 (×2): qty 1

## 2014-03-03 MED ORDER — HYDROXYZINE HCL 25 MG PO TABS
25.0000 mg | ORAL_TABLET | Freq: Four times a day (QID) | ORAL | Status: DC | PRN
  Administered 2014-03-04: 25 mg via ORAL
  Filled 2014-03-03: qty 1

## 2014-03-03 MED ORDER — TRAZODONE HCL 50 MG PO TABS
50.0000 mg | ORAL_TABLET | Freq: Every evening | ORAL | Status: DC | PRN
  Administered 2014-03-04: 50 mg via ORAL
  Filled 2014-03-03 (×5): qty 1

## 2014-03-03 MED ORDER — HALOPERIDOL 5 MG PO TABS
5.0000 mg | ORAL_TABLET | Freq: Four times a day (QID) | ORAL | Status: DC | PRN
  Administered 2014-03-04: 5 mg via ORAL
  Filled 2014-03-03: qty 1

## 2014-03-03 MED ORDER — NICOTINE 21 MG/24HR TD PT24
21.0000 mg | MEDICATED_PATCH | Freq: Every day | TRANSDERMAL | Status: DC
  Administered 2014-03-03: 21 mg via TRANSDERMAL
  Filled 2014-03-03: qty 1

## 2014-03-03 MED ORDER — QUETIAPINE FUMARATE 100 MG PO TABS: 100.0000 mg | ORAL_TABLET | ORAL | Status: DC

## 2014-03-03 MED ORDER — ONDANSETRON HCL 4 MG PO TABS: 4.0000 mg | ORAL_TABLET | Freq: Three times a day (TID) | ORAL | Status: DC | PRN

## 2014-03-03 MED ORDER — DIVALPROEX SODIUM 500 MG PO DR TAB
500.0000 mg | DELAYED_RELEASE_TABLET | Freq: Two times a day (BID) | ORAL | Status: DC
  Administered 2014-03-03 – 2014-03-04 (×2): 500 mg via ORAL
  Filled 2014-03-03 (×5): qty 1

## 2014-03-03 MED ORDER — LORAZEPAM 2 MG/ML IJ SOLN: 2.0000 mg | Freq: Four times a day (QID) | INTRAMUSCULAR | Status: DC | PRN

## 2014-03-03 MED ORDER — HALOPERIDOL LACTATE 5 MG/ML IJ SOLN
5.0000 mg | Freq: Four times a day (QID) | INTRAMUSCULAR | Status: DC | PRN
Start: 2014-03-03 — End: 2014-03-04

## 2014-03-03 NOTE — ED Notes (Signed)
Pt not cooperating with assessment, speaking to unseen and unheard individuals, the pt is screaming at unseen individuals involved in a heated discussion with the air. The pt's medication bottles sent with her for hydroxyzine for 30 day supply filled on 02/13/14 is empty, seroquel 30 day supply empty, divalproex 30 day supply has 15 pills left which should be enough for 7.5 days. Medication is not being taken correctly. Spoke with pt's aunt Burnis KingfisherVickie Martin on the phone, [phone], who states her behavior has deteriorated over past few days, horrible last night, states the pt is very paranoid and has auditory and visual hallucinations.

## 2014-03-03 NOTE — BH Assessment (Signed)
Writer spoke to the patients mother.  Her mother reports that there is a history of substance abuse.   Her mother reports that she smoked Cry Meth or Bath Salts.  Her mother reports that she was hospitalized at [facility] in September 2015 for three days.    Her mother reports that she has been taking her medication, however, the medication was very strong and made her sleep all the time.  Her mother reports that she does not see a therapist.  Her mother reports that this behaviors started Saturday.

## 2014-03-03 NOTE — ED Notes (Signed)
Medication ordered for psychosis not for restraint.

## 2014-03-03 NOTE — Progress Notes (Signed)
This is a 23 years old Caucasian female admitted to the unit for Poly Substance abuse. [facility]. It took staff some time to walk patient into the search room. Patient appeared to be actively hallucinating, staring , talking and responding to internal stimuli. Patient is in no shape to sign any paperwork or complete any of the admission assessement. Karleen HampshireSpencer PA, instructed staff to walk patient to seclusion room. It took a while to get patient to walk into the seclusion room. Writer notified patient's primary nurse about patient's status and inability to complete her assessment. Q 15 minute check initiated.

## 2014-03-03 NOTE — Progress Notes (Signed)
Pt brought in without a report called. Admission was not able to be done due to pt actively hallucinating , delusional, and confused. Pt was not oriented. Pt thought she was at [facility]. Pt continues to make refernces  To her mother like her mother is present. Pt was placed in quiet room for safety. Pt actively responding to AVH, based on her refernces and her having conversations with people not seen by this Clinical research associatewriter. Pt given medications.

## 2014-03-03 NOTE — ED Provider Notes (Signed)
[name], TSS, [facility] Elna Bres[facility]appen, but they want a UDS before she comes.  Nurse informed.   Devoria AlbeIva Adir Schicker, MD, Armando GangFACEP   Ward GivensIva L Analeah Brame, MD 03/03/14 [phone]

## 2014-03-03 NOTE — ED Notes (Signed)
[facility]

## 2014-03-03 NOTE — BH Assessment (Signed)
Writer informed Dr. Lynelle DoctorKnapp that [facility] pending the results of the UDS.

## 2014-03-03 NOTE — BH Assessment (Signed)
Per Dr. Lucianne MussKumar patient meets criteria for inpatient hospitalization.  [facility]

## 2014-03-03 NOTE — ED Notes (Signed)
Pt transported by [facility]. 

## 2014-03-03 NOTE — ED Notes (Signed)
IVC papers have been taken out on this pt.  Pt.  Changed into paper scrubs.  Labs drawn.  Pt. Cooperating at this time.

## 2014-03-03 NOTE — BH Assessment (Addendum)
Assessment Note  Becky PancoastDanielle Zahradnik is a 23 year old white female that was speaking to unseen and unheard individuals.  During the assessment, the patient had a blank stare and she was not orientated to person, time place or situation.  Patient refused to answer questions during the assessment.   Writer received collateral information from her mother that reports that the patient has a history of substance abuse. Her mother reports that she smoked Crystal Meth in the past.  Her mother reports that she was hospitalized at [facility] in [facility] for [facility] days.  Her mother reports that she has been taking her medication; however, the medication was very strong and made her sleep all the time. Her mother reports that she does not see a therapist. Her mother reports that these behaviors started Saturday.  Documentation in the epic chart reports that the patient was previously diagnosed with Bipolar Disorder.   Axis I: Mood Disorder NOS  Polysubstance Abuse, Severe Axis II: Deferred Axis III:  Past Medical History  Diagnosis Date  . Bronchitis   . Peptic ulcer   . Bipolar 1 disorder    Axis IV: economic problems, housing problems, other psychosocial or environmental problems, problems related to social environment, problems with access to health care services and problems with primary support group Axis V: 21-30 behavior considerably influenced by delusions or hallucinations OR serious impairment in judgment, communication OR inability to function in almost all areas  Past Medical History:  Past Medical History  Diagnosis Date  . Bronchitis   . Peptic ulcer   . Bipolar 1 disorder     Past Surgical History  Procedure Laterality Date  . Other surgical history      scar tissue removed from right ovary   . Other surgical history      fallopian tube repair  . Wisdom tooth extraction    . Tubal ligation      Family History:  Family History  Problem Relation Age of Onset  .  Cancer Other     Social History:  reports that she has been smoking Cigarettes.  She has a 10 pack-year smoking history. She has never used smokeless tobacco. She reports that she drinks alcohol. She reports that she uses illicit drugs (Marijuana and Methamphetamines).  Additional Social History:     CIWA: CIWA-Ar BP: 112/75 mmHg Pulse Rate: 97 COWS:    Allergies:  Allergies  Allergen Reactions  . Nsaids Other (See Comments)    Peptic ulcers   . Tylenol [Acetaminophen] Itching and Rash    Home Medications:  (Not in a hospital admission)  OB/GYN Status:  Patient's last menstrual period was 02/01/2014.  General Assessment Data Location of Assessment: [facility] ED Is this a Tele or Face-to-Face Assessment?: Face-to-Face Is this an Initial Assessment or a Re-assessment for this encounter?: Initial Assessment Living Arrangements: Parent;Other (Comment) Can pt return to current living arrangement?: Yes Admission Status: Involuntary Is patient capable of signing voluntary admission?: Yes Transfer from: Home Referral Source: Self/Family/Friend  Medical Screening Exam [facility] Walk-in ONLY) Medical Exam completed: Yes  [facility] Living Arrangements: Parent;Other (Comment) Name of Psychiatrist: None Name of Therapist: None  Education Status Is patient currently in school?: No Current Grade: NA Highest grade of school patient has completed: NA Name of school: NA Contact person: NA  Risk to self with the past 6 months Suicidal Ideation: No Suicidal Intent: No Is patient at risk for suicide?: No Suicidal Plan?: No Access to Means: No What has  been your use of drugs/alcohol within the last 12 months?:  (Refused to answer the question ) Previous Attempts/Gestures:  (Unknown ) How many times?:  (Unknown ) Other Self Harm Risks:  (UTA) Triggers for Past Attempts: Unpredictable Intentional Self Injurious Behavior:  (UTA) Family Suicide History: Yes Recent stressful life  event(s): Other (Comment) (UTA) Persecutory voices/beliefs?: Yes Depression: Yes Depression Symptoms: Despondent;Insomnia;Tearfulness;Fatigue;Isolating;Guilt;Loss of interest in usual pleasures;Feeling worthless/self pity;Feeling angry/irritable Substance abuse history and/or treatment for substance abuse?: Yes Suicide prevention information given to non-admitted patients: Not applicable  Risk to Others within the past 6 months Homicidal Ideation: No Thoughts of Harm to Others: No Current Homicidal Intent: No Current Homicidal Plan: No Access to Homicidal Means: No Identified Victim: None Reported History of harm to others?: No Assessment of Violence: None Noted Violent Behavior Description: None Reported Does patient have access to weapons?: No Criminal Charges Pending?: No Does patient have a court date: No  Psychosis Hallucinations: Auditory;Visual Delusions: Grandiose  Mental Status Report Appear/Hygiene: In hospital gown Eye Contact: Poor Motor Activity: Freedom of movement Speech: Word salad;Tangential Level of Consciousness: Restless;Crying;Irritable Mood: Depressed;Suspicious Affect: Blunted;Anxious;Flat Anxiety Level: Minimal Thought Processes: Tangential;Flight of Ideas;Thought Blocking Judgement: Impaired Orientation: Not oriented Obsessive Compulsive Thoughts/Behaviors: None  Cognitive Functioning Concentration: Decreased Memory: Unable to Assess IQ: Average Insight: Unable to Assess Impulse Control: Poor Appetite: Fair Weight Loss: 0 Weight Gain: 0 Sleep: Unable to Assess Vegetative Symptoms: Unable to Assess  ADLScreening [facility] Assessment Services) Patient's cognitive ability adequate to safely complete daily activities?: Yes Patient able to express need for assistance with ADLs?: Yes Independently performs ADLs?: Yes (appropriate for developmental age)  Prior Inpatient Therapy Prior Inpatient Therapy: Yes Prior Therapy Dates: 10/2013,  2013 [facility] Reason for Treatment: Polysubstance dependence  Prior Outpatient Therapy Prior Outpatient Therapy: No Prior Therapy Dates: NA Prior Therapy Facilty/Provider(s): NA Reason for Treatment: NA  ADL Screening (condition at time of admission) Patient's cognitive ability adequate to safely complete daily activities?: Yes Patient able to express need for assistance with ADLs?: Yes Independently performs ADLs?: Yes (appropriate for developmental age)         Values / Beliefs Cultural Requests During Hospitalization:  (Unknown) Spiritual Requests During Hospitalization:  (Unknown)   Advance Directives (For Healthcare) Does patient have an advance directive?: No Would patient like information on creating an advanced directive?: No - patient declined information    Additional Information 1:1 In Past 12 Months?: No CIRT Risk: No Elopement Risk: No Does patient have medical clearance?: Yes     Disposition: Per Dr. Lucianne MussKumar patient meets criteria for inpatient hospitalization.  [facility]   Disposition Initial Assessment Completed for this Encounter: Yes Disposition of Patient: Inpatient treatment program;Referred to Type of inpatient treatment program: Adult Patient referred to: Other (Comment) (Per Dr. Lucianne MussKumar patietn meets criteria for inpatient hospitali)  On Site Evaluation by:   Reviewed with Physician:    Phillip HealStevenson, Joeleen Wortley LaVerne 03/03/2014 4:58 PM

## 2014-03-03 NOTE — ED Notes (Signed)
Dr. Lynelle DoctorKnapp is in room assessing pt.

## 2014-03-03 NOTE — ED Provider Notes (Addendum)
CSN: [id number]     Arrival date & time 03/03/14  0940 History  This chart was scribed for Ward Givens, MD by Karle Plumber, ED Scribe. This patient was seen in room APA16A/APA16A and the patient's care was started at 10:54 AM.  Chief Complaint  Patient presents with  . Hallucinations   The history is provided by the patient and the EMS personnel. No language interpreter was used.   LEVEL 5 CAVEAT- due to to psychiatric illness.  HPI Comments:  Becky [name] is a 23 y.o. female with PMHx of bipolar disorder brought in by EMS, who presents to the Emergency Department for visual and audio hallucinations. Family called EMS due to pt being paranoid. They told EMS patient had been up all night. Nurses have observed auditory and visual hallucinations while in ED. Pt refuses to communicate upon entering exam room although she was yelling several minutes prior.  PCP none  Past Medical History  Diagnosis Date  . Bronchitis   . Peptic ulcer   . Bipolar 1 disorder    Past Surgical History  Procedure Laterality Date  . Other surgical history      scar tissue removed from right ovary   . Other surgical history      fallopian tube repair  . Wisdom tooth extraction    . Tubal ligation     Family History  Problem Relation Age of Onset  . Cancer Other    History  Substance Use Topics  . Smoking status: Current Every Day Smoker -- 1.00 packs/day for 10 years    Types: Cigarettes  . Smokeless tobacco: Never Used  . Alcohol Use: Yes     Comment: occ   Pt live with her mother  OB History   Grav Para Term Preterm Abortions TAB SAB Ect Mult Living   1    1     0     Review of Systems   LEVEL 5 CAVEAT- Full history could not be obtained due to refusing to psychiatric illness.  Allergies  Nsaids and Tylenol  Home Medications   Prior to Admission medications   Medication Sig Start Date End Date Taking? Authorizing Provider  divalproex (DEPAKOTE) 500 MG DR tablet Take 500 mg  by mouth 2 (two) times daily.   Yes Historical Provider, MD  hydrOXYzine (VISTARIL) 50 MG capsule Take 100 mg by mouth at bedtime as needed (sleep).   Yes Historical Provider, MD  QUEtiapine (SEROQUEL) 100 MG tablet Take 100-200 mg by mouth See admin instructions. Take 1 tablet in the morning and 2 tablets at bedtime.   Yes Historical Provider, MD   Triage Vitals: BP 116/74  Pulse 114  Temp(Src) 98.7 F (37.1 C) (Oral)  Resp 16  Ht 5\' 2"  (1.575 m)  Wt 130 lb (58.968 kg)  BMI 23.77 kg/m2  SpO2 99%  LMP 02/01/2014  Vital signs normal except tachycardia  Physical Exam  Nursing note and vitals reviewed. Constitutional: She is oriented to person, place, and time. She appears well-developed and well-nourished.  Non-toxic appearance. She does not appear ill. No distress.  HENT:  Head: Normocephalic and atraumatic.  Right Ear: External ear normal.  Left Ear: External ear normal.  Nose: Nose normal. No mucosal edema or rhinorrhea.  Mouth/Throat: Oropharynx is clear and moist and mucous membranes are normal. No dental abscesses or uvula swelling.  Refuses to open mouth for examination.  Eyes: Conjunctivae and EOM are normal.  Pupils dilated and reactive.  Neck: Normal range  of motion and full passive range of motion without pain. Neck supple.  Cardiovascular: Regular rhythm and normal heart sounds.  Tachycardia present.  Exam reveals no gallop and no friction rub.   No murmur heard. Pulmonary/Chest: Effort normal and breath sounds normal. No respiratory distress. She has no wheezes. She has no rhonchi. She has no rales. She exhibits no tenderness and no crepitus.  Abdominal: Soft. Normal appearance and bowel sounds are normal. She exhibits no distension. There is no tenderness. There is no rebound and no guarding.  Musculoskeletal: Normal range of motion. She exhibits no edema and no tenderness.  Moves all extremities well.   Neurological: She is alert and oriented to person, place, and  time. She has normal strength. No cranial nerve deficit.  Skin: Skin is warm, dry and intact. No rash noted. No erythema. No pallor.  Psychiatric: Her mood appears anxious. Her affect is inappropriate. She is agitated and hyperactive. Thought content is paranoid and delusional.  Patient is acting very paranoid. When I stand in front of her she tries to look around me to see what people were doing in the hall. Although she was yelling at me when I first saw her going into another patient's room, at the time of my exam she refused to speak.    ED Course  Procedures (including critical care time)  Medications  LORazepam (ATIVAN) tablet 1 mg (1 mg Oral Given 03/03/14 1532)  zolpidem (AMBIEN) tablet 10 mg (not administered)  nicotine (NICODERM CQ - dosed in mg/24 hours) patch 21 mg (21 mg Transdermal Patch Applied 03/03/14 1532)  ondansetron (ZOFRAN) tablet 4 mg (not administered)  alum & mag hydroxide-simeth (MAALOX/MYLANTA) 200-200-20 MG/5ML suspension 30 mL (not administered)  divalproex (DEPAKOTE) DR tablet 500 mg (500 mg Oral Given 03/03/14 1531)  hydrOXYzine (ATARAX/VISTARIL) tablet 100 mg (not administered)  QUEtiapine (SEROQUEL) tablet 100 mg (100 mg Oral Given 03/03/14 1532)    And  QUEtiapine (SEROQUEL) tablet 200 mg (not administered)  ziprasidone (GEODON) injection 10 mg (10 mg Intramuscular Given 03/03/14 1208)  sterile water (preservative free) injection (  Duplicate 03/03/14 1208)    DIAGNOSTIC STUDIES: Oxygen Saturation is 99% on RA, normal by my interpretation.   COORDINATION OF CARE: 10:57 AM- Will IVC patient.   IVC papers filled out by me.  Pharmacy tech notes patient has empty pill bottles that were filled on 10/1. I do not feel like patient overdosed because she should be heavily sedated. I feel most likely she has poured out her pills due to paranoia.  Review of her chart shows patient was just [facility] to [facility] for acute psychosis.  Patient  was uncooperative as far as getting undressed or following procedure for psychiatric patients. She was actively hallucinating that her mother was hiding behind the nurse's station. She was given Geodon.  1600 TSS evaluation pending.  Labs Review Results for orders placed during the hospital encounter of 03/03/14  VALPROIC ACID LEVEL      Result Value Ref Range   Valproic Acid Lvl 35.7 (*) 50.0 - 100.0 ug/mL  CBC WITH DIFFERENTIAL      Result Value Ref Range   WBC 9.6  4.0 - 10.5 K/uL   RBC 4.18  3.87 - 5.11 MIL/uL   Hemoglobin 12.8  12.0 - 15.0 g/dL   HCT 09.836.7  11.936.0 - 14.746.0 %   MCV 87.8  78.0 - 100.0 fL   MCH 30.6  26.0 - 34.0 pg   MCHC 34.9  30.0 - 36.0 g/dL   RDW 41.313.8  24.411.5 - 01.015.5 %   Platelets 281  150 - 400 K/uL   Neutrophils Relative % 72  43 - 77 %   Neutro Abs 7.0  1.7 - 7.7 K/uL   Lymphocytes Relative 20  12 - 46 %   Lymphs Abs 1.9  0.7 - 4.0 K/uL   Monocytes Relative 8  3 - 12 %   Monocytes Absolute 0.7  0.1 - 1.0 K/uL   Eosinophils Relative 0  0 - 5 %   Eosinophils Absolute 0.0  0.0 - 0.7 K/uL   Basophils Relative 0  0 - 1 %   Basophils Absolute 0.0  0.0 - 0.1 K/uL  COMPREHENSIVE METABOLIC PANEL      Result Value Ref Range   Sodium 140  137 - 147 mEq/L   Potassium 4.0  3.7 - 5.3 mEq/L   Chloride 102  96 - 112 mEq/L   CO2 24  19 - 32 mEq/L   Glucose, Bld 119 (*) 70 - 99 mg/dL   BUN 11  6 - 23 mg/dL   Creatinine, Ser 2.720.63  0.50 - 1.10 mg/dL   Calcium 9.1  8.4 - 53.610.5 mg/dL   Total Protein 7.4  6.0 - 8.3 g/dL   Albumin 4.0  3.5 - 5.2 g/dL   AST 15  0 - 37 U/L   ALT 15  0 - 35 U/L   Alkaline Phosphatase 69  39 - 117 U/L   Total Bilirubin 0.3  0.3 - 1.2 mg/dL   GFR calc non Af Amer >90  >90 mL/min   GFR calc Af Amer >90  >90 mL/min   Anion gap 14  5 - 15  ETHANOL      Result Value Ref Range   Alcohol, Ethyl (B) <11  0 - 11 mg/dL  URINALYSIS, ROUTINE W REFLEX MICROSCOPIC      Result Value Ref Range   Color, Urine YELLOW  YELLOW   APPearance HAZY (*) CLEAR    Specific Gravity, Urine 1.010  1.005 - 1.030   pH 7.0  5.0 - 8.0   Glucose, UA NEGATIVE  NEGATIVE mg/dL   Hgb urine dipstick NEGATIVE  NEGATIVE   Bilirubin Urine NEGATIVE  NEGATIVE   Ketones, ur NEGATIVE  NEGATIVE mg/dL   Protein, ur NEGATIVE  NEGATIVE mg/dL   Urobilinogen, UA 0.2  0.0 - 1.0 mg/dL   Nitrite NEGATIVE  NEGATIVE   Leukocytes, UA NEGATIVE  NEGATIVE  PREGNANCY, URINE      Result Value Ref Range   Preg Test, Ur NEGATIVE  NEGATIVE  URINE RAPID DRUG SCREEN (HOSP PERFORMED)      Result Value Ref Range   Opiates NONE DETECTED  NONE DETECTED   Cocaine NONE DETECTED  NONE DETECTED   Benzodiazepines POSITIVE (*) NONE DETECTED   Amphetamines NONE DETECTED  NONE DETECTED   Tetrahydrocannabinol POSITIVE (*) NONE DETECTED   Barbiturates NONE DETECTED  NONE DETECTED    Laboratory interpretation all normal except subtherapeutic valproic acid level, + UDS    Imaging Review No results found.   EKG Interpretation None      MDM   Final diagnoses:  Psychosis, unspecified psychosis type  Bipolar affective disorder, currently manic, severe, with psychotic features  Hallucination, visual  Auditory hallucination   Disposition pending.  Devoria AlbeIva Samy Ryner, MD, FACEP   I personally performed the services described in this documentation, which was scribed in my presence. The recorded information has been reviewed  and considered.  Devoria Albe, MD, FACEP    Ward Givens, MD 03/03/14 1610  Ward Givens, MD 03/03/14 1750

## 2014-03-03 NOTE — ED Notes (Signed)
Patient brought by EMS from home with auditory and visual hallucinations. Per EMS, unsure of when this started. Patient not answering questions at triage. Uncooperative at triage. States "I'm not signing and she's not signing either. You're not taking this stuff off."

## 2014-03-03 NOTE — ED Notes (Signed)
[facility] called to transport the pt.

## 2014-03-04 ENCOUNTER — Encounter (HOSPITAL_COMMUNITY): Payer: Self-pay | Admitting: Psychiatry

## 2014-03-04 DIAGNOSIS — F1190 Opioid use, unspecified, uncomplicated: Secondary | ICD-10-CM

## 2014-03-04 DIAGNOSIS — F1220 Cannabis dependence, uncomplicated: Secondary | ICD-10-CM

## 2014-03-04 DIAGNOSIS — F4310 Post-traumatic stress disorder, unspecified: Secondary | ICD-10-CM

## 2014-03-04 DIAGNOSIS — F1590 Other stimulant use, unspecified, uncomplicated: Secondary | ICD-10-CM

## 2014-03-04 DIAGNOSIS — F1390 Sedative, hypnotic, or anxiolytic use, unspecified, uncomplicated: Secondary | ICD-10-CM

## 2014-03-04 MED ORDER — VITAMIN B-1 100 MG PO TABS
100.0000 mg | ORAL_TABLET | Freq: Every day | ORAL | Status: DC
Start: 2014-03-05 — End: 2014-03-09
  Administered 2014-03-05 – 2014-03-09 (×5): 100 mg via ORAL
  Filled 2014-03-04 (×7): qty 1

## 2014-03-04 MED ORDER — PROPRANOLOL HCL 20 MG PO TABS
20.0000 mg | ORAL_TABLET | Freq: Two times a day (BID) | ORAL | Status: AC
  Administered 2014-03-04 – 2014-03-06 (×4): 20 mg via ORAL
  Filled 2014-03-04 (×4): qty 1

## 2014-03-04 MED ORDER — NORTRIPTYLINE HCL 25 MG PO CAPS: 25.0000 mg | ORAL_CAPSULE | Freq: Every day | ORAL | Status: DC

## 2014-03-04 MED ORDER — BENZTROPINE MESYLATE 0.5 MG PO TABS
0.5000 mg | ORAL_TABLET | Freq: Two times a day (BID) | ORAL | Status: DC
  Administered 2014-03-04 – 2014-03-06 (×5): 0.5 mg via ORAL
  Filled 2014-03-04 (×9): qty 1

## 2014-03-04 MED ORDER — HALOPERIDOL 5 MG PO TABS
5.0000 mg | ORAL_TABLET | Freq: Two times a day (BID) | ORAL | Status: DC
  Administered 2014-03-04 – 2014-03-06 (×4): 5 mg via ORAL
  Filled 2014-03-04 (×6): qty 1

## 2014-03-04 MED ORDER — ONDANSETRON 4 MG PO TBDP: 4.0000 mg | ORAL_TABLET | Freq: Four times a day (QID) | ORAL | Status: AC | PRN

## 2014-03-04 MED ORDER — ENSURE COMPLETE PO LIQD
237.0000 mL | Freq: Two times a day (BID) | ORAL | Status: DC
  Administered 2014-03-04 – 2014-03-10 (×2): 237 mL via ORAL

## 2014-03-04 MED ORDER — MIRTAZAPINE 15 MG PO TBDP
15.0000 mg | ORAL_TABLET | Freq: Every day | ORAL | Status: DC
  Administered 2014-03-04 – 2014-03-09 (×6): 15 mg via ORAL
  Filled 2014-03-04 (×9): qty 1

## 2014-03-04 MED ORDER — ADULT MULTIVITAMIN W/MINERALS CH
1.0000 | ORAL_TABLET | Freq: Every day | ORAL | Status: DC
  Administered 2014-03-04 – 2014-03-09 (×6): 1 via ORAL
  Filled 2014-03-04 (×8): qty 1

## 2014-03-04 MED ORDER — THIAMINE HCL 100 MG/ML IJ SOLN: 100.0000 mg | Freq: Once | INTRAMUSCULAR | Status: DC

## 2014-03-04 MED ORDER — LOPERAMIDE HCL 2 MG PO CAPS: 2.0000 mg | ORAL_CAPSULE | ORAL | Status: AC | PRN

## 2014-03-04 MED ORDER — CLONAZEPAM 0.5 MG PO TABS: 0.5000 mg | ORAL_TABLET | Freq: Two times a day (BID) | ORAL | Status: DC

## 2014-03-04 MED ORDER — LORAZEPAM 1 MG PO TABS: 1.0000 mg | ORAL_TABLET | Freq: Four times a day (QID) | ORAL | Status: DC | PRN

## 2014-03-04 MED ORDER — PANTOPRAZOLE SODIUM 20 MG PO TBEC
20.0000 mg | DELAYED_RELEASE_TABLET | Freq: Two times a day (BID) | ORAL | Status: DC
  Administered 2014-03-04 – 2014-03-12 (×16): 20 mg via ORAL
  Filled 2014-03-04 (×19): qty 1

## 2014-03-04 NOTE — [facility] Suicide Risk Assessment (Signed)
   Nursing information obtained from:    Demographic factors:    Current Mental Status:    Loss Factors:    Historical Factors:    Risk Reduction Factors:    Total Time spent with patient: 45 minutes  CLINICAL FACTORS:   Depression:   Comorbid alcohol abuse/dependence Impulsivity Alcohol/Substance Abuse/Dependencies  Psychiatric Specialty Exam: Physical Exam Please see H&P.   ROS Please see H&P.   Blood pressure 115/82, pulse 114, temperature 97.7 F (36.5 C), temperature source Oral, resp. rate 17, last menstrual period 02/01/2014.There is no weight on file to calculate BMI.  Please see H&P for MSE      SUICIDE RISK:   Moderate:  Frequent suicidal ideation with limited intensity, and duration, some specificity in terms of plans, no associated intent, good self-control, limited dysphoria/symptomatology, some risk factors present, and identifiable protective factors, including available and accessible social support.  PLAN OF CARE:Please see H&P.   I certify that inpatient services furnished can reasonably be expected to improve the patient's condition.  Becky Stefanik  md  03/04/2014, 1:50 PM

## 2014-03-04 NOTE — Tx Team (Signed)
Interdisciplinary Treatment Plan Update (Adult)   Date: 03/04/2014   Time Reviewed: 10:30 AM  Progress in Treatment:  Attending groups: No.  Participating in groups:  No.  Taking medication as prescribed: Yes  Tolerating medication: Yes  Family/Significant othe contact made: Not yet. SPE and collateral info needed.   Patient understands diagnosis: No-pt actively delusional, paranoid, and psychotic-responding to internal stimuli.  Discussing patient identified problems/goals with staff: Yes  Medical problems stabilized or resolved: Yes  Denies suicidal/homicidal ideation: Yes  Patient has not harmed self or Others: Yes  New problem(s) identified:  Discharge Plan or Barriers: CSW unable to assess pt at this time due to her mental state. CSW seeking collateral info from pt's mother.  Additional comments:  Becky Becky [name] is a 23 year old white female that was speaking to unseen and unheard individuals. During the assessment, the patient had a blank stare and she was not orientated to person, time place or situation. Patient refused to answer questions during the assessment. Writer received collateral information from her mother that reports that the patient has a history of substance abuse. Her mother reports that she smoked Crystal Meth in the past. Her mother reports that she was hospitalized at [facility] in [facility] 2015 for three days.  Her mother reports that she has been taking her medication; however, the medication was very strong and made her sleep all the time. Her mother reports that she does not see a therapist. Her mother reports that these behaviors started Saturday. Patient was previously diagnosed with Bipolar Disorder.  Reason for Continuation of Hospitalization: Psychosis (AVH, responding to internal stimuli, paranoid, delusional) Mood stabilization Medication management  Estimated length of stay: 5-7 days  For review of initial/current patient goals, please see plan of  care.  Attendees:  Patient:    Family:    Physician: Dr. Elna BreslowEappen, MD 03/04/2014 10:30 AM   Nursing: Herbert Moorsonecia, Britney, Beverly RN 03/04/2014 10:30 AM   Clinical Social Worker Madalee Altmann Smart, LCSWA  03/04/2014 10:30 AM   Other: Daryel Geraldodney North, LCSW 03/04/2014 10:30 AM   Other: Santa GeneraAnne Cunningham, LCSW  03/04/2014 10:30 AM   Other: Tomasita Morrowelora Sutton, Community Care Coordinator  03/04/2014 10:30 AM   Other:    Scribe for Treatment Team:  The Sherwin-WilliamsHeather Smart LCSWA 03/04/2014 10:30 AM

## 2014-03-04 NOTE — Progress Notes (Signed)
1:1 Nursing Note  D: Patient in the quiet room, sitting in the chair with a glaring appearance; continues to respond to internal stimuli.  A: Maintain close observation with 1:1 sitter/MHT for safety.  R: Remains safe on the unit.
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
## 2014-03-04 NOTE — Progress Notes (Signed)
Nursing 1:1 note  D:Pt observed sitting on bed, actively talking to people not seen by Clinical research associatewriter. RR even and unlabored. No distress noted A: 1:1 observation continues for safety  R: pt remains safe

## 2014-03-04 NOTE — Progress Notes (Signed)
1:1 Nursing Note  D: Patient is in the quiet room, sleeping. Even respirations and no signs of distress noted at this time.  A: Close observation of pt. for safety.  R: Patient safe on the 500 hall.

## 2014-03-04 NOTE — Progress Notes (Signed)
D: Patient has blunted affect and suspicious mood. Declined self inventory sheet today. Patient thoughts are very disorganized, whenever she chooses to speak. She's been responding to internal stimuli throughout the day. Often yelling out for her mama. Patient is endorsing auditory hallucinations and intrusive as far as staff or other patient's having a conversation and she responding as if you were speaking to her. If asked to take her medication, she verbalizes "ok mama". Adheres to current medication regimen.  A: Support and encouragement provided to patient. Administered scheduled medications per ordering MD. Monitor Q15 minute checks and 1:1 patient observation for safety.  R: Patient receptive. Denies SI/HI/AVH. Patient remains safe on the unit.

## 2014-03-04 NOTE — Progress Notes (Signed)
1:1 Nursing Note  D: Patient in treatment room with sitter, MD, and writer to initiate a mental assessment. She's calm, cooperative, but responding to internal stimuli; even labored respirations; no s/s of distress noted.  A: Support and encouragement provided to the patient. Maintain continuous observation and Q15 minute checks for safety.  R: Patient remains safe on the unit.

## 2014-03-04 NOTE — [facility] Group Notes (Signed)
[facility] LCSW Group Therapy  03/04/2014 11:19 AM  Type of Therapy:  Group Therapy  Participation Level: Invited-- Did Not Attend  [name], Becky Goshert LCSWA 03/04/2014, 11:19 AM
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
## 2014-03-04 NOTE — Progress Notes (Signed)
Nursing 1:1 note D:Pt observed in quiet room awake. RR even and unlabored. No distress noted. Pt up the majority of the night actively hallucinating, delusional, disoriented A: 1:1 observation continues for safety  R: pt remains safe

## 2014-03-04 NOTE — Progress Notes (Signed)
Nursing 1:1 note D:Pt observed sitting up in bed, pt continues to be delusional, actively responding to AVH . RR even and unlabored. No distress noted. A: 1:1 observation continues for safety  R: pt remains safe

## 2014-03-05 LAB — LIPID PANEL
CHOL/HDL RATIO: 4.7 ratio
CHOLESTEROL: 213 mg/dL — AB (ref 0–200)
HDL: 45 mg/dL (ref >39)
LDL Cholesterol: 130 mg/dL — ABNORMAL HIGH (ref 0–99)
TRIGLYCERIDES: 190 mg/dL — AB (ref <150)
VLDL: 38 mg/dL (ref 0–40)

## 2014-03-05 LAB — HEMOGLOBIN A1C
Hgb A1c MFr Bld: 5.7 % — ABNORMAL HIGH (ref <5.7)
MEAN PLASMA GLUCOSE: 117 mg/dL — AB (ref <117)

## 2014-03-05 LAB — TSH: TSH: 1.67 u[IU]/mL (ref 0.350–4.500)

## 2014-03-05 MED ORDER — VENLAFAXINE HCL ER 37.5 MG PO CP24
37.5000 mg | ORAL_CAPSULE | Freq: Every day | ORAL | Status: DC
  Administered 2014-03-06: 37.5 mg via ORAL
  Filled 2014-03-05 (×4): qty 1

## 2014-03-05 MED ORDER — SIMVASTATIN 20 MG PO TABS
20.0000 mg | ORAL_TABLET | Freq: Every day | ORAL | Status: DC
  Administered 2014-03-05 – 2014-03-11 (×7): 20 mg via ORAL
  Filled 2014-03-05 (×9): qty 1

## 2014-03-05 MED ORDER — OLANZAPINE 5 MG PO TBDP: 5.0000 mg | ORAL_TABLET | Freq: Three times a day (TID) | ORAL | Status: DC | PRN

## 2014-03-05 NOTE — Progress Notes (Signed)
1:1 Nursing Note  D: Patient is resting in bed at this time; no s/s of distress noted.  A: Maintaining Q15 checks and close observation of pt.  R: Patient is safe on the unit.

## 2014-03-05 NOTE — Progress Notes (Signed)
   D:Pt laying in bed resting with eyes closed. Respirations even and unlabored. No distress noted. A: Monitor 1:1 for safety. R: Pt remains safe.   

## 2014-03-05 NOTE — [facility] Group Notes (Signed)
Adult Psychoeducational Group Note  Date:  03/05/2014 Time:  9:40 PM  Group Topic/Focus:  Wrap-Up Group:   The focus of this group is to help patients review their daily goal of treatment and discuss progress on daily workbooks.  Participation Level:  Did Not Attend  Participation Quality:  None  Affect:  None  Cognitive:  None  Insight: None  Engagement in Group:  None  Modes of Intervention:  Discussion  Additional Comments:  [name] did not attend group.  Caroll RancherLindsay, Jordynn Perrier A 03/05/2014, 9:40 PM

## 2014-03-05 NOTE — Progress Notes (Signed)
Patient ID: Becky [name], female   DOB: 10/04/1990, 23 y.o.   MRN: [id number]  D: Writer approached and requested that pt come to the window to get meds. Pt was spontaneous and immediately got out of bed.  When asked about her day pt stated, "alright".  When asked if she spoke to her dr today, pt replied "yes". When asked if he had any questions or concerns pt requested to sleep back in the quiet room. Stated her room is too hot.  After getting her medications pt thanked Retail bankerthe writer.  A: MHT adjusted the temp for the air. Monitor 1:1 for safety. Support and encouragement was offered.  R: Pt remains safe.

## 2014-03-05 NOTE — Progress Notes (Signed)
Patient ID: Becky PancoastDanielle Jerde, female   DOB: 07/08/1990, 23 y.o.   MRN: [id number]  D:  Pt was in the quiet room sitting on the bed. Writer observed pt looking around with an intense look on her face. Pt would begin to lay down, then sit straight up and look at the ceiling and around the bed. Pt called out "momma". Pt looked at the writer and asked, "do you hear her?" Writer denied being able to hear the pt's mother and informed the pt that her mother was probably at home resting. Encouraged the pt to rest.  A:  Support and encouragement was offered. 1:1 continued for safety.  R: Pt remains safe.

## 2014-03-05 NOTE — [facility] Suicide Risk Assessment (Signed)
[facility] INPATIENT:  Family/Significant Other Suicide Prevention Education  Suicide Prevention Education:  Education Completed; Hattie PerchGwendolyn Conaway (pt's mother) [phone] has been identified by the patient as the family member/significant other with whom the patient will be residing, and identified as the person(s) who will aid the patient in the event of a mental health crisis (suicidal ideations/suicide attempt).  With written consent from the patient, the family member/significant other has been provided the following suicide prevention education, prior to the and/or following the discharge of the patient.  The suicide prevention education provided includes the following:  Suicide risk factors  Suicide prevention and interventions  National Suicide Hotline telephone number  [facility] assessment telephone number  [facility] Assistance 911  [facility] and/or Residential Mobile Crisis Unit telephone number  Request made of family/significant other to:  Remove weapons (e.g., guns, rifles, knives), all items previously/currently identified as safety concern.    Remove drugs/medications (over-the-counter, prescriptions, illicit drugs), all items previously/currently identified as a safety concern.  The family member/significant other verbalizes understanding of the suicide prevention education information provided.  The family member/significant other agrees to remove the items of safety concern listed above.  Smart, Hashir Deleeuw LCSWA  03/05/2014, 2:56 PM

## 2014-03-05 NOTE — Progress Notes (Signed)
D: Patient presents with preoccupied affect and suspicious mood. Patient has been sleeping in the quiet room all day; she has only been up when having to take her medications and eat meals. Patient is in compliance with the current medication regimen. Today, she's responding to questions that are being asked, but in a cautious manner and using no more than one or two words to answer; which is improvement from the prior shift.  A: Support and encouragement provided to patient. Scheduled medications administered per MD orders. Maintain Q15 minute checks for safety.  R: Patient receptive. Denies SI/HI and AVH, but still appears to be responding to some internal stimuli. Patient remains safe on the hall.

## 2014-03-05 NOTE — Progress Notes (Signed)
1:1 Nursing Note  D: Patient is sitting in the dayroom amongst peers watching television; no interaction with others.  A: Monitor Q15 minute checks and 1:1 observation.  R: Patient is receptive and remains safe on the unit.

## 2014-03-05 NOTE — Progress Notes (Signed)
D: Pt was pleasant and cooperative this morning. Pt responded appropriately to Clinical research associatewriter. Pt responded "good morning and thank you" at the times expected.   A: Continue 1:1 for pt safety. Support and encouragement was offered.   R: Pt remains safe.

## 2014-03-05 NOTE — [facility] Group Notes (Signed)
[facility] LCSW Group Therapy  03/05/2014 1:50 PM  Type of Therapy:  Group Therapy  Participation Level: Invited. Did Not Attend   Hyatt,Candace 03/05/2014, 1:50 PM

## 2014-03-05 NOTE — Progress Notes (Signed)
1:1 Nursing Note  D: Patient lying down in the quiet room; even labored breathing.  A: Continuous 1:1 observation and 15 minute checks for safety.  R: Safe on the unit.
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
## 2014-03-06 MED ORDER — OLANZAPINE 10 MG PO TBDP
10.0000 mg | ORAL_TABLET | Freq: Every day | ORAL | Status: DC
  Administered 2014-03-06 – 2014-03-07 (×2): 10 mg via ORAL
  Filled 2014-03-06 (×3): qty 1

## 2014-03-06 MED ORDER — DIPHENHYDRAMINE HCL 50 MG/ML IJ SOLN
INTRAMUSCULAR | Status: AC
  Filled 2014-03-06: qty 1

## 2014-03-06 MED ORDER — LORAZEPAM 1 MG PO TABS
1.0000 mg | ORAL_TABLET | Freq: Four times a day (QID) | ORAL | Status: AC | PRN
  Administered 2014-03-06: 1 mg via ORAL
  Filled 2014-03-06: qty 1

## 2014-03-06 MED ORDER — HYDROXYZINE HCL 25 MG PO TABS: 25.0000 mg | ORAL_TABLET | Freq: Four times a day (QID) | ORAL | Status: AC | PRN

## 2014-03-06 MED ORDER — DIPHENHYDRAMINE HCL 50 MG/ML IJ SOLN
50.0000 mg | Freq: Once | INTRAMUSCULAR | Status: AC
  Administered 2014-03-06: 50 mg via INTRAMUSCULAR
  Filled 2014-03-06: qty 1

## 2014-03-06 MED ORDER — HALOPERIDOL 5 MG PO TABS
10.0000 mg | ORAL_TABLET | Freq: Every evening | ORAL | Status: DC
  Filled 2014-03-06 (×2): qty 2

## 2014-03-06 MED ORDER — NICOTINE 21 MG/24HR TD PT24
21.0000 mg | MEDICATED_PATCH | Freq: Every day | TRANSDERMAL | Status: DC
  Administered 2014-03-06 – 2014-03-08 (×3): 21 mg via TRANSDERMAL
  Filled 2014-03-06 (×5): qty 1

## 2014-03-06 MED ORDER — HALOPERIDOL 5 MG PO TABS
5.0000 mg | ORAL_TABLET | Freq: Every day | ORAL | Status: DC
  Filled 2014-03-06 (×2): qty 1

## 2014-03-06 MED ORDER — PRAZOSIN HCL 1 MG PO CAPS
1.0000 mg | ORAL_CAPSULE | Freq: Every day | ORAL | Status: DC
  Administered 2014-03-06 – 2014-03-11 (×6): 1 mg via ORAL
  Filled 2014-03-06 (×8): qty 1

## 2014-03-06 NOTE — Progress Notes (Signed)
[facility] Post 1:1 Observation Documentation  For the first (8) hours following discontinuation of 1:1 precautions, a progress note entry by nursing staff should be documented at least every 2 hours, reflecting the patient's behavior, condition, mood, and conversation.  Use the progress notes for additional entries.  Time 1:1 discontinued:  0830 Time of note: 1430  Patient's Behavior:  Resting in bed, calm, quiet.  Patient's Condition:  Stable.  Patient's Conversation:  Pt relief from benadryl IM. Denies stiffness.  Lawrence MarseillesFriedman, Ahniyah Giancola Eakes 03/06/2014, 2:30pm

## 2014-03-06 NOTE — Progress Notes (Signed)
Pt stated that she had an ok day. Her main goal is to get on the right medication, so that she can go home.

## 2014-03-06 NOTE — Progress Notes (Signed)
[facility] Post 1:1 Observation Documentation  For the first (8) hours following discontinuation of 1:1 precautions, a progress note entry by nursing staff should be documented at least every 2 hours, reflecting the patient's behavior, condition, mood, and conversation.  Use the progress notes for additional entries.  Time 1:1 discontinued:  0830 Time of note: 1030  Patient's Behavior:  Calm, resting in bed, quiet.  Patient's Condition:  Stable, safe.  Patient's Conversation:  Minimal. Patient denies complaints.   Lawrence MarseillesFriedman, [name] 03/06/2014, 10:30am

## 2014-03-06 NOTE — Clinical Social Work Note (Signed)
[facility] Care Manager Jenel LucksSarah Morris [phone]) visited with pt this afternoon. CSW left message for Maralyn SagoSarah to follow-up and requested call back.   The Sherwin-WilliamsHeather Smart, LCSWA 03/06/2014 2:27 PM

## 2014-03-06 NOTE — [facility] Group Notes (Signed)
[facility] Group Notes:  (Counselor/Nursing/MHT/Case Management/Adjunct)  03/06/2014 1:15PM  Type of Therapy:  Group Therapy  Participation Level: Did not attend  Summary of Progress/Problems: The topic for group was balance in life.  Pt participated in the discussion about when their life was in balance and out of balance and how this feels.  Pt discussed ways to get back in balance and short term goals they can work on to get where they want to be.    Daryel Geraldorth, Becky [name] 03/06/2014 1:38 PM

## 2014-03-06 NOTE — Plan of Care (Signed)
Problem: Ineffective individual coping Goal: STG: Patient will remain free from self harm Outcome: Progressing Patient has refrained from self harm and is denying SI.  Problem: Alteration in mood & ability to function due to Goal: STG-Patient will report withdrawal symptoms Outcome: Completed/Met Date Met:  03/06/14 Patient has denied withdrawal symptoms today (CIWAs "0") therefore q6 hour CIWAs d/c.  Goal: STG-Patient will attend groups Outcome: Not Progressing Patient is still not attending groups. Cannot verbalize why she is apprehensive.

## 2014-03-06 NOTE — [facility] Group Notes (Signed)
The focus of this group is to educate the patient on the purpose and policies of crisis stabilization and provide a format to answer questions about their admission.  The group details unit policies and expectations of patients while admitted. Patient did not attend this group. 

## 2014-03-06 NOTE — Progress Notes (Signed)
[facility] Post 1:1 Observation Documentation  For the first (8) hours following discontinuation of 1:1 precautions, a progress note entry by nursing staff should be documented at least every 2 hours, reflecting the patient's behavior, condition, mood, and conversation.  Use the progress notes for additional entries.  Time 1:1 discontinued:  0830 Time of note: 12:30  Patient's Behavior:  Calm, quiet. Sitting in cafeteria but not eating.  Patient's Condition:  Stable.  Patient's Conversation:  Talking quietly with peer. Did not eat stating "the food is gross."  Lawrence MarseillesFriedman, [name] 03/06/2014, 12:30pm

## 2014-03-06 NOTE — Progress Notes (Signed)
1:1 note:  Patient observed lying in bed, awake. She is flat, depressed and withdrawn. She forwards minimal information and at times does not answer. She does endorse AH stating "I hear my name." Also endorses VH but cannot verbalize what she sees. Patient medicated per orders without difficulty. Offered support and reassurance. She denies physical problems or pain. No SI/HI. 1:1 remains for safety and pt is safe. Lawrence MarseillesFriedman, January Bergthold Eakes

## 2014-03-06 NOTE — Progress Notes (Signed)
[facility] Post 1:1 Observation Documentation  For the first (8) hours following discontinuation of 1:1 precautions, a progress note entry by nursing staff should be documented at least every 2 hours, reflecting the patient's behavior, condition, mood, and conversation.  Use the progress notes for additional entries.  Time 1:1 discontinued:  0830  Time of note: 1630  Patient's Behavior:  Calm, quiet, reading in bed.   Patient's Condition:  Stable.  Patient's Conversation:  Asking when she might go home. Continues to deny any further EPS.  Merian CapronFriedman, Wilberto Console [facility] HealthEakes 03/06/2014, 1630

## 2014-03-06 NOTE — Progress Notes (Signed)
D: Pt was pleasant and appropriate this morning. Pt was spontaneous with receiving medications even asking for a nicotine patch.   A: Continue 1:1 for pt safety. Support and encouragement was offered.   R: Pt remains safe.

## 2014-03-06 NOTE — Progress Notes (Signed)
Patient has had no further complaints of stiffness or suspected EPS. Reports quick relief from the benadryl IM. Patient remains flat, blank but is slightly more verbal than earlier in the day. Patient asking if she is invol and when she can go home. Reassured patient that she was still on a medicine twice a day to prevent the symptoms she experienced today (cogentin). Asked patient if she felt she was truly ready to leave and she agreed that it would be premature. Supported and reassured patient. Patient currently at dinner in cafeteria and is safe. Lawrence MarseillesFriedman, Jad Johansson Eakes

## 2014-03-06 NOTE — Progress Notes (Signed)
Patient complaining of neck stiffness. States she feels a sensation that her head is being pulled up and to the R. Patient is staring in that direction however can refocus when asked. Able to move her head side to side and up and down freely. Denies that she is experiencing AVH. Dr. Elna BreslowEappen informed and order received to admin benadryl 50mg  IM. Given without difficulty. Will reassess within 30 minutes.Pt lying in bed at present. Lawrence MarseillesFriedman, Tzirel Leonor Eakes

## 2014-03-06 NOTE — Progress Notes (Signed)
   D:Pt laying in bed resting with eyes closed. Respirations even and unlabored. No distress noted. A: Monitor 1:1 for safety. R: Pt remains safe.   
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
## 2014-03-06 NOTE — Progress Notes (Signed)
After evaluation by MD, order received to discontinue 1:1 observation. Begin Level III obs. Will continue to monitor closely. Lawrence MarseillesFriedman, Aniaya Bacha Eakes

## 2014-03-06 NOTE — Progress Notes (Signed)
Patient began to complain of neck stiffness upon her return from dinner. AIMS completed and is a "0". No rigidity felt during assessment. Patient able to follow commands. Patient visibly anxious though denies she is worried about anything. Medicated with prn ativan 1mg  po and given heat pack for relaxation. On reassess, pt reports some relief. Lawrence MarseillesFriedman, Haiven Nardone Eakes

## 2014-03-07 MED ORDER — VENLAFAXINE HCL ER 75 MG PO CP24
75.0000 mg | ORAL_CAPSULE | Freq: Every day | ORAL | Status: DC
  Administered 2014-03-08 – 2014-03-09 (×2): 75 mg via ORAL
  Filled 2014-03-07 (×3): qty 1

## 2014-03-07 MED ORDER — BENZTROPINE MESYLATE 0.5 MG PO TABS
0.5000 mg | ORAL_TABLET | Freq: Every day | ORAL | Status: DC
  Administered 2014-03-07 – 2014-03-11 (×5): 0.5 mg via ORAL
  Filled 2014-03-07 (×6): qty 1

## 2014-03-07 MED ORDER — HALOPERIDOL 5 MG PO TABS
ORAL_TABLET | ORAL | Status: AC
  Filled 2014-03-07: qty 1

## 2014-03-07 MED ORDER — LORAZEPAM 1 MG PO TABS
1.0000 mg | ORAL_TABLET | Freq: Once | ORAL | Status: AC
  Administered 2014-03-07: 1 mg via ORAL

## 2014-03-07 MED ORDER — LORAZEPAM 1 MG PO TABS
ORAL_TABLET | ORAL | Status: AC
  Filled 2014-03-07: qty 1

## 2014-03-07 MED ORDER — BUSPIRONE HCL 15 MG PO TABS
7.5000 mg | ORAL_TABLET | Freq: Three times a day (TID) | ORAL | Status: DC
  Administered 2014-03-07 – 2014-03-09 (×7): 7.5 mg via ORAL
  Filled 2014-03-07 (×11): qty 1

## 2014-03-07 MED ORDER — HALOPERIDOL 2 MG PO TABS
2.5000 mg | ORAL_TABLET | Freq: Once | ORAL | Status: AC
  Administered 2014-03-07: 19:00:00 via ORAL
  Filled 2014-03-07: qty 1

## 2014-03-07 NOTE — Progress Notes (Signed)
NUTRITION ASSESSMENT  RD consulted for nutrition education.  INTERVENTION: 1. Educated patient on the importance of nutrition and encouraged intake of food and beverages. 2. Discussed weight goals. 3. Supplements: Receiving Ensure Complete po BID, each supplement provides 350 kcal and 13 grams of protein   NUTRITION DIAGNOSIS: Unintentional weight loss related to sub-optimal intake as evidenced by 6% wt loss x 3 months.   Goal: Pt to meet >/= 90% of their estimated nutrition needs.  Monitor:  PO intake  Assessment:  23 year old CF, who presented with active hallucinations, thought blocking as well as dysphoria. Patient has a hx of polysubstance abuse and had several rehab as well as detox admissions in the past.  During visit, pt presents with flat affect. Pt is quiet and not very receptive to education at this time. Pt was able to report poor appetite. Pt states that she has had some weight gain recently. Per weight history documentation pt has lost 10 lb since July (7% wt loss x 3 months). Pt states that when she does drugs she doesn't eat as well.   Discussed the importance of good nutrition for mental health and aiding in recovery.   Pt is currently receiving Ensure supplements BID, pt is declining them.  Labs reviewed: Elevated Chol, TG, LDL  Height: Ht Readings from Last 1 Encounters:  03/03/14 5\' 2"  (1.575 m)    Weight: Wt Readings from Last 1 Encounters:  03/03/14 130 lb (58.968 kg)    Weight Hx: Wt Readings from Last 10 Encounters:  03/03/14 130 lb (58.968 kg)  02/07/14 130 lb 9 oz (59.223 kg)  11/13/13 140 lb (63.504 kg)  09/04/13 142 lb (64.411 kg)  01/13/13 135 lb (61.236 kg)  09/16/12 136 lb (61.689 kg)  08/17/12 136 lb (61.689 kg)  02/18/12 116 lb (52.617 kg)  01/22/12 116 lb (52.617 kg)  11/17/11 117 lb 3 oz (53.156 kg)    BMI: 23.9 Pt meets criteria for normal range based on current BMI.  Estimated Nutritional Needs: Kcal: 25-30  kcal/kg Protein: > 1 gram protein/kg Fluid: 1 ml/kcal  Diet Order: General Pt is also offered choice of unit snacks mid-morning and mid-afternoon.  Pt is eating as desired.   Lab results and medications reviewed.   Tilda FrancoLindsey Precious Segall, MS, RD, LDN Pager: [phone] After Hours Pager: [phone]

## 2014-03-07 NOTE — Tx Team (Signed)
Interdisciplinary Treatment Plan Update (Adult)   Date: 03/07/2014   Time Reviewed: 11:39 AM  Progress in Treatment:  Attending groups: Intermittently  Participating in groups: Minimally   Taking medication as prescribed: Yes  Tolerating medication: Yes  Family/Significant othe contact made: Contact made with pt's mother for SPE and collateral info.  Patient understands diagnosis: Somewhat, pt insight improving.  Discussing patient identified problems/goals with staff: Yes  Medical problems stabilized or resolved: Yes  Denies suicidal/homicidal ideation: Yes  Patient has not harmed self or Others: Yes  New problem(s) identified:  Discharge Plan or Barriers: Pt to return home at d/c and will [facility] for [facility] IOP, med management, and therapy. Pt refusing inpatient treatment but is open to CSW providing her with inpatient treatment information if she changes her mind after d/c home.  Additional comments:  Becky [name] is a 23 year old white female that was speaking to unseen and unheard individuals. During the assessment, the patient had a blank stare and she was not orientated to person, time place or situation. Patient refused to answer questions during the assessment. Writer received collateral information from her mother that reports that the patient has a history of substance abuse. Her mother reports that she smoked Crystal Meth in the past. Her mother reports that she was [facility] for three days.  Her mother reports that she has been taking her medication; however, the medication was very strong and made her sleep all the time. Her mother reports that she does not see a therapist. Her mother reports that these behaviors started Saturday. Patient was previously diagnosed with Bipolar Disorder.   10/23: Pt no longer on 1:1 and appears to be less disorganized and is interacting with peers and staff appropriately, with moments of  confusion. She is not actively hallucinating at this time and denies SI/HI. Pt states that she wants to go home and followup [facility] because "I need to get a job to get money so I can move out on my own."  Reason for Continuation of Hospitalization: Mood stabilization Medication management  Estimated length of stay: 3-4 days  For review of initial/current patient goals, please see plan of care.  Attendees:  Patient:    Family:    Physician: Dr. Elna BreslowEappen, MD 03/07/2014 11:39 AM   Nursing:  03/07/2014 11:39 AM   Clinical Social Worker [employer]Heather Smart, LCSWA  03/07/2014 11:39 AM   Other: Daryel Geraldodney North, LCSW 03/07/2014 11:39 AM   Other: Santa GeneraAnne Cunningham, LCSW  03/07/2014 11:39 AM   Other: Tomasita Morrowelora Sutton, Community Care Coordinator  03/07/2014 11:39 AM   Other:    Scribe for Treatment Team:  [employer]Heather Smart LCSWA 03/07/2014 11:39 AM
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
## 2014-03-07 NOTE — Progress Notes (Signed)
Patient ID: Becky PancoastDanielle Stark, female   DOB: 04/26/1991, 23 y.o.   MRN: [id number] D. Patient decompensating, anxious paranoid stating '' I'm not dumb, I know what you staff are doing here. You're setting things up with fake patients and making it to the phone so it can't work. I'm not stupid. I want to get out of here now! '' Patient clearly paranoid, anxious and confused. A. Notified Dr. Lynnae SandhoffAkthar of above information as pt with no prn medication orders at this time. Orders received. Pt accepted medication reluctantly. R. Will continue to monitor q 15 minutes for safety.

## 2014-03-07 NOTE — Progress Notes (Signed)
D: Pt has flat affect and apprehensive mood.  Pt reports her day was "okay, except for the neck problems I was having earlier."  Pt denies SI/HI.  She denies hallucinations, stating "I'm not having them now.  They stopped my Depakote and Seroquel though, so I'll need something so that I don't start having them again."  Pt attended evening group and has been interacting with staff and peers appropriately.   A: Met with pt 1:1 and provided support and encouragement.  Educated pt on scheduled night medications.  Safety maintained.  Medications administered per order.   R: Pt is compliant with medications and verbally contracts for safety.  She is in no distress.  Will continue to monitor and assess for safety.   

## 2014-03-07 NOTE — [facility] Group Notes (Signed)
[facility] LCSW Group Therapy  03/07/2014 2:48 PM  Type of Therapy:  Group Therapy  Participation Level:  None  Participation Quality:  Attentive  Affect:  Flat  Cognitive:  Lacking  Insight:  Limited  Engagement in Therapy:  Limited  Modes of Intervention:  Discussion, Education, Socialization and Support  Summary of Progress/Problems:Feelings around Relapse. Group members discussed the meaning of relapse and shared personal stories of relapse, how it affected them and others, and how they perceived themselves during this time. Group members were encouraged to identify triggers, warning signs and coping skills used when facing the possibility of relapse. Social supports were discussed and explored in detail. Willye attended group and stayed the entire time. She sat quietly and listened to the other group members.     Hyatt,Candace 03/07/2014, 2:48 PM

## 2014-03-07 NOTE — Progress Notes (Signed)
Patient ID: Becky PancoastDanielle [name], female   DOB: 10/26/1990, 23 y.o.   MRN: [id number] D. Patient presents with depressed mood, affect blunted. [name] reports that she is not sleeping, and continues to feel anxious and depressed. Her thoughts remain disorganized, although she denies any AH/VH with Clinical research associatewriter. Pt continues to report feeling depressed, rating at 6/10 on depression scale. She states '' I'm hoping I can talk with this counselor and coordinate something. Like IOP. My boyfriend has 6 months sober so I'd like to get clean. '' She denies any SI/HI. Noted some thought blocking . A. Medications given as ordered. Support and encouragement provided. Discussed above information with treatment team/Dr. Elna BreslowEappen. R. Patient is safe. In no acute distress at this time. Will continue to monitor q 15 minutes for safety.

## 2014-03-07 NOTE — Plan of Care (Signed)
Problem: Alteration in mood & ability to function due to Goal: STG-Patient will comply with prescribed medication regimen (Patient will comply with prescribed medication regimen)  Outcome: Progressing Pt has been compliant with all scheduled medications this shift.

## 2014-03-07 NOTE — [facility] Group Notes (Signed)
[facility]   03/07/2014 11:14 AM  Participation Quality:  Engaged  Mood/Affect:  Depressed  Depression Rating:  5  Anxiety Rating:  7  Thoughts of Suicide:  No Will you contract for safety?   NA  Current AVH:  Yes  Plan for Discharge/Comments:  "I don't want to go to rehab.  I already did that.  I want IOP.  I have too many things to do.  I have to get a job and make some money so i can get my own place.  I have to get out of that house.  I also need to get my driver's license back.  [facility]  That[facility] need."  Transportation Means:   Supports:  [employer], Thereasa DistanceRodney B

## 2014-03-08 MED ORDER — LORAZEPAM 2 MG/ML IJ SOLN
2.0000 mg | Freq: Once | INTRAMUSCULAR | Status: AC
  Administered 2014-03-08: 2 mg via INTRAMUSCULAR

## 2014-03-08 MED ORDER — LORAZEPAM 2 MG/ML IJ SOLN
INTRAMUSCULAR | Status: AC
  Filled 2014-03-08: qty 1

## 2014-03-08 MED ORDER — HALOPERIDOL 1 MG PO TABS
2.5000 mg | ORAL_TABLET | Freq: Four times a day (QID) | ORAL | Status: DC | PRN
  Administered 2014-03-08: 2.5 mg via ORAL

## 2014-03-08 MED ORDER — LORAZEPAM 2 MG/ML IJ SOLN
INTRAMUSCULAR | Status: AC
  Administered 2014-03-08: 2 mg via INTRAMUSCULAR
  Filled 2014-03-08: qty 1

## 2014-03-08 MED ORDER — DIPHENHYDRAMINE HCL 50 MG/ML IJ SOLN
50.0000 mg | Freq: Once | INTRAMUSCULAR | Status: AC
  Administered 2014-03-08: 50 mg via INTRAMUSCULAR
  Filled 2014-03-08: qty 1

## 2014-03-08 MED ORDER — OLANZAPINE 10 MG PO TBDP
10.0000 mg | ORAL_TABLET | Freq: Two times a day (BID) | ORAL | Status: DC
  Administered 2014-03-08 – 2014-03-12 (×8): 10 mg via ORAL
  Filled 2014-03-08 (×11): qty 1

## 2014-03-08 MED ORDER — HALOPERIDOL LACTATE 5 MG/ML IJ SOLN
INTRAMUSCULAR | Status: AC
  Filled 2014-03-08: qty 2

## 2014-03-08 MED ORDER — OLANZAPINE 10 MG PO TBDP
10.0000 mg | ORAL_TABLET | Freq: Once | ORAL | Status: AC
  Administered 2014-03-08: 10 mg via ORAL
  Filled 2014-03-08: qty 1

## 2014-03-08 MED ORDER — DIPHENHYDRAMINE HCL 50 MG/ML IJ SOLN
50.0000 mg | Freq: Three times a day (TID) | INTRAMUSCULAR | Status: DC | PRN
  Administered 2014-03-08 – 2014-03-09 (×2): 50 mg via INTRAMUSCULAR
  Filled 2014-03-08 (×2): qty 1

## 2014-03-08 MED ORDER — LORAZEPAM 1 MG PO TABS
1.0000 mg | ORAL_TABLET | Freq: Four times a day (QID) | ORAL | Status: DC | PRN
Start: 2014-03-08 — End: 2014-03-12
  Administered 2014-03-08 – 2014-03-11 (×3): 1 mg via ORAL
  Filled 2014-03-08 (×3): qty 1

## 2014-03-08 MED ORDER — HALOPERIDOL 5 MG PO TABS
ORAL_TABLET | ORAL | Status: AC
  Filled 2014-03-08: qty 1

## 2014-03-08 MED ORDER — DIPHENHYDRAMINE HCL 50 MG/ML IJ SOLN
INTRAMUSCULAR | Status: AC
  Administered 2014-03-08: 50 mg via INTRAMUSCULAR
  Filled 2014-03-08: qty 1

## 2014-03-08 MED ORDER — DIPHENHYDRAMINE HCL 50 MG/ML IJ SOLN
INTRAMUSCULAR | Status: AC
  Filled 2014-03-08: qty 1

## 2014-03-08 MED ORDER — HALOPERIDOL LACTATE 5 MG/ML IJ SOLN
10.0000 mg | Freq: Once | INTRAMUSCULAR | Status: AC
  Administered 2014-03-08: 10 mg via INTRAMUSCULAR
  Administered 2014-03-09: 2.5 mg via INTRAMUSCULAR
  Filled 2014-03-08: qty 2

## 2014-03-08 MED ORDER — DIPHENHYDRAMINE HCL 50 MG/ML IJ SOLN
50.0000 mg | Freq: Once | INTRAMUSCULAR | Status: AC
  Administered 2014-03-08: 50 mg via INTRAMUSCULAR

## 2014-03-08 NOTE — Progress Notes (Signed)
[facility]  03/08/2014 10:19 AM Becky [name]  MRN:  [id number] Subjective:  Pt states that "I am still confused, and I dont know why?".  Objective: Patient seen and chart reviewed. Patient seen in her room sitting up in bed. Pt appears to be more cooperative and is able to relay her thoughts at this time after recent CIRT. She was assessed by staff, no physical injuries or complaints at this time. Her thought blocking appears to have reduced. Recently started on zyprexa zidis 10mg  QHS, which seems to be working for her at this time. Pt reports anxiety and is requesting prn medications for the same.Patient denies SI/HI. Patient is compliant on medications. Patient denies any side effects.   Patient has a chronic hx of substance abuse Per staff pt is more disorganized and paranoid "stating the hurricane is not real that is on TV and you guys have the phones blocked." She is more responsive to questions but continues to have a significant amount of thought blocking.    Diagnosis:   DSM5: Primary Psychiatric Diagnosis:  Sedative hypnotic and anxiolytic(XANAX) use disorder, severe   Secondary Psychiatric Diagnosis:  Substance (xanax,cannabis,opioid ,methamphetamine) induced psychotic disorder  Cannabis use disorder, severe  Stimulant use disorder,unspecified type  Opioid use disorder,moderate  PTSD   Non Psychiatric Diagnosis:  GI ulcer  Hyperlipidemia     Total Time spent with patient: 30 minutes   ADL's:  Impaired  Sleep: Fair  Appetite:  Fair   Psychiatric Specialty Exam: Physical Exam  Constitutional: She appears well-developed.  Eyes: Pupils are equal, round, and reactive to light.  Musculoskeletal: Normal range of motion.  Neurological: She is alert.  Skin: Skin is warm and dry.    Review of Systems  Psychiatric/Behavioral: Positive for hallucinations. The patient has insomnia.   All other systems reviewed and are negative.   Blood pressure  113/63, pulse 110, temperature 98.2 F (36.8 C), temperature source Oral, resp. rate 16, last menstrual period 02/01/2014.There is no weight on file to calculate BMI.  General Appearance: Fairly Groomed  Patent attorneyye Contact::  Fair  Speech:  Blocked improving  Volume:  Decreased  Mood:  Anxious and Dysphoric  Affect:  Congruent  Thought Process:  Disorganized  Orientation:  Full (Time, Place, and Person)  Thought Content:  Delusions, Hallucinations: Auditory and Rumination  Suicidal Thoughts:  No  Homicidal Thoughts:  No  Memory:  Immediate;   Fair Recent;   Fair Remote;   Fair  Judgement:  Impaired  Insight:  Lacking  Psychomotor Activity:  Decreased  Concentration:  Fair  Recall:  Fis[employer]Fair  Fund of Knowledge:Fair  Language: Good  Akathisia:  No    AIMS (if indicated):     Assets:  Physical Health Social Support  Sleep:  Number of Hours: 6.25   Musculoskeletal: Strength & Muscle Tone: within normal limits Gait & Station: normal Patient leans: N/A  Current Medications: Current Facility-Administered Medications  Medication Dose Route Frequency Provider Last Rate Last Dose  . alum & mag hydroxide-simeth (MAALOX/MYLANTA) 200-200-20 MG/5ML suspension 30 mL  30 mL Oral Q4H PRN Kerry HoughSpencer E Simon, PA-C      . benztropine (COGENTIN) tablet 0.5 mg  0.5 mg Oral QHS Saramma Eappen, MD   0.5 mg at 03/07/14 2050  . busPIRone (BUSPAR) tablet 7.5 mg  7.5 mg Oral TID Jomarie LongsSaramma Eappen, MD   7.5 mg at 03/08/14 0753  . diphenhydrAMINE (BENADRYL) injection 50 mg  50 mg Intramuscular Q8H PRN Truman Haywardakia S Starkes, FNP      .  feeding supplement (ENSURE COMPLETE) (ENSURE COMPLETE) liquid 237 mL  237 mL Oral BID BM Saramma Eappen, MD   237 mL at 03/04/14 1151  . haloperidol (HALDOL) tablet 2.5 mg  2.5 mg Oral Q6H PRN Truman Hayward, FNP      . LORazepam (ATIVAN) tablet 1 mg  1 mg Oral Q6H PRN Truman Hayward, FNP      . magnesium hydroxide (MILK OF MAGNESIA) suspension 30 mL  30 mL Oral Daily PRN Kerry Hough,  PA-C      . mirtazapine (REMERON SOL-TAB) disintegrating tablet 15 mg  15 mg Oral QHS Jomarie Longs, MD   15 mg at 03/07/14 2050  . multivitamin with minerals tablet 1 tablet  1 tablet Oral Daily Jomarie Longs, MD   1 tablet at 03/08/14 0753  . nicotine (NICODERM CQ - dosed in mg/24 hours) patch 21 mg  21 mg Transdermal Daily Jomarie Longs, MD   21 mg at 03/08/14 0755  . OLANZapine zydis (ZYPREXA) disintegrating tablet 10 mg  10 mg Oral BID Truman Hayward, FNP      . pantoprazole (PROTONIX) EC tablet 20 mg  20 mg Oral BID AC Jomarie Longs, MD   20 mg at 03/08/14 1610  . prazosin (MINIPRESS) capsule 1 mg  1 mg Oral QHS Jomarie Longs, MD   1 mg at 03/07/14 2050  . simvastatin (ZOCOR) tablet 20 mg  20 mg Oral q1800 Jomarie Longs, MD   20 mg at 03/07/14 1652  . thiamine (B-1) injection 100 mg  100 mg Intramuscular Once Saramma Eappen, MD      . thiamine (VITAMIN B-1) tablet 100 mg  100 mg Oral Daily Saramma Eappen, MD   100 mg at 03/08/14 0753  . venlafaxine XR (EFFEXOR-XR) 24 hr capsule 75 mg  75 mg Oral Q breakfast Saramma Eappen, MD   75 mg at 03/08/14 0753    Lab Results:  No results found for this or any previous visit (from the past 48 hour(s)).  Physical Findings: AIMS: Facial and Oral Movements Muscles of Facial Expression: None, normal Lips and Perioral Area: None, normal Jaw: None, normal Tongue: None, normal,Extremity Movements Upper (arms, wrists, hands, fingers): None, normal Lower (legs, knees, ankles, toes): None, normal, Trunk Movements Neck, shoulders, hips: None, normal, Overall Severity Severity of abnormal movements (highest score from questions above): None, normal Incapacitation due to abnormal movements: None, normal Patient's awareness of abnormal movements (rate only patient's report): No Awareness, Dental Status Current problems with teeth and/or dentures?: No Does patient usually wear dentures?: No  CIWA:  CIWA-Ar Total: 0 COWS:     Treatment Plan  Summary: Daily contact with patient to assess and evaluate symptoms and progress in treatment Medication management  Assessment: Patient is a 23 year old CF, who presented with active hallucinations, thought blocking as well as dysphoria. Patient has a hx of polysubstance abuse and had several rehab as well as detox admissions in the past. CIRT was performed today on her, due to increase agitation paranoia and disorganized thoughts. IM medications were given, physical exam performed please refer to order management.    Plan: Will increase Effexor XR to 75 mg for anxiety ,flashbacks ,but will be cautious since she has GI issues. Will continue Buspar 7.5 mg po tid for anxiety sx. Will continue Remeron 15 mg po qhs for sleep. Will resume haldol for agitation prn. Benadryl 50mg  IM for EPS reaction.  Continue Zyprexa Zydis 10 mg po BID for agitation. Will continue  Prazosin 1 mg po qhs for nightmares. Will continue Cogentin po for EPS. Will continue  Ativan prn for anxiety/agitation. Will continue Protonix 20 mg po bid for GI issues. Will add Zocor 20 mg for hyperlipidemia.Diet consult. Pt to be referred to a substance abuse program once stable. Placed order for bathsalts ,k2 -in process.  CSW will work on disposition.   Medical Decision Making Problem Points:  Established problem, stable/improving (1) and Review of last therapy session (1) Data Points:  Order Aims Assessment (2) Review or order clinical lab tests (1) Review of medication regiment & side effects (2) Review of new medications or change in dosage (2)  I certify that inpatient services furnished can reasonably be expected to improve the patient's condition.   Malachy ChamberSTARKES, TAKIA S FNP-BC 03/08/2014, 10:19 AM I agreed with findings and treatment plan of this patient

## 2014-03-08 NOTE — [facility] Group Notes (Signed)
[facility] Group Notes:  (Clinical Social Work)  03/08/2014  11:15-12:00PM  Summary of Progress/Problems:   The main focus of today's process group was to discuss patients' feelings about hospitalization, the stigma attached to mental health, and sources of motivation to stay well.  We then worked to identify a specific plan to avoid future hospitalizations when discharged from the hospital for this admission.  The patient expressed that she is in the hospital for withdrawal from various substances.  She stated she is embarrassed to be in the hospital.  She would not say anything further, was quiet for the remainder of the time she was in group, but did also leave early.  Type of Therapy:  Group Therapy - Process  Participation Level:  Minimal  Participation Quality:  Resistant  Affect:  Flat  Cognitive:  Alert  Insight:  Poor  Engagement in Therapy:  Poor  Modes of Intervention:  Exploration, Discussion  Ambrose MantleMareida Grossman-Orr, LCSW 03/08/2014, 1:56 PM

## 2014-03-08 NOTE — Progress Notes (Signed)
[facility] Group Notes:  (Nursing/MT/Case Management/Adjunct)  Date:  03/08/2014  Time:  9:33 PM  Type of Therapy:  Psychoeducational Skills  Participation Level:  Minimal  Participation Quality:  Attentive  Affect:  Depressed  Cognitive:  Disorganized  Insight:  Good  Engagement in Group:  Distracting  Modes of Intervention:  Education  Summary of Progress/Problems: The patient described her day as having been "up and down". The patient verbalized that she had a good talk with her nurse today and that individual was able to explain the medication to her in a manner that she could understand. In addition, she indicated that she had a good talk with her mother and aunt today. In terms of the theme for the day, her coping skill will be to go swimming.   Becky CocaGOODMAN, Becky Kimery S 03/08/2014, 9:33 PM

## 2014-03-08 NOTE — Plan of Care (Signed)
Problem: Alteration in mood & ability to function due to Goal: STG-Patient will comply with prescribed medication regimen (Patient will comply with prescribed medication regimen)  Outcome: Progressing Patient is compliant with medications.

## 2014-03-08 NOTE — Progress Notes (Signed)
Patient ID: Ambrose PancoastDanielle Waldrep, female   DOB: 01/20/1991, 23 y.o.   MRN: [id number] D. Patient has been increasingly paranoid, anxious, and actively hallucinating. She reports that bed sheets are moving '' pulsating on my bed. '' and reports '' i'm hearing my mother call to me. '' patient also visibly touching the floor. She reports clear AH stating '' they are telling me not to trust you that you are lying'' patient scared, anxious and clearly decompensating. Pt was given prn medications as well as scheduled medications without noted improvements. She was unable to tolerate any stimulation on the unit and was assisted to quiet room without issue. She began sobbing, stating ''i'm hearing my momma, i'm confused, what's going on ? '' A. Notified Eric RN - AC of above information. Notified Fredna Dowakia of above information and patients decompensation. Orders received for ativan 2mg  IM, and benadryl 50mg  IM once. Patient accepted injections without issue. She was given phone to call mother for support which seemed to help reduce anxiety/paranoia. R. Patient currently on phone with mother. In no acute distress at this time. Will continue to monitor q 15 minutes for safety.

## 2014-03-08 NOTE — Plan of Care (Signed)
Problem: Ineffective individual coping Goal: STG: Patient will remain free from self harm Outcome: Progressing Patient has remained free from self harm.     

## 2014-03-08 NOTE — [facility] Group Notes (Signed)
[facility] Group Notes:  (Nursing/MHT/Case Management/Adjunct)  Date:  03/08/2014  Time:  0900 am  Type of Therapy:  Psychoeducational Skills  Participation Level:  None  Participation Quality:  Resistant  Affect:  Flat and Irritable  Cognitive:  Lacking  Insight:  Lacking  Engagement in Group:  Resistant  Modes of Intervention:  Support  Summary of Progress/Problems:  Becky [name], Becky [name] 03/08/2014, 2:40 PM

## 2014-03-08 NOTE — Progress Notes (Signed)
Writer spoke wqith patient at beginning of shift and she reported that she "flipped out" earlier but is a little better now. Patient apoloigzed to peers in group for how she acted today and peers were receptive. She inquired about her medications and asked to take them early b/c she didn't get much rest last night. Patient reports + auditory and visual hallucinations. She reports seeing shadows and muffled sounds. Support and encouragement given. She reported that she plans to finish her on line courses for school. Safety maintained on unit with 15 min checks.

## 2014-03-08 NOTE — Progress Notes (Addendum)
Patient ID: Becky PancoastDanielle [name], female   DOB: 01/05/1991, 23 y.o.   MRN: [id number] D. Pt presents with irritable mood, affect congruent. Patient thoughts disorganized, pt paranoid stating '' I know you staff are playing games on me, and that's why you were laughing in the hallway and the phone didn't work. I know that the hurricane on the TV isn't real. This is all a set up. I know what you're doing here. '' She remains paranoid, suspicious, anxious and tearful. Pt staring at writer, and unable to process.  Patient in group became more and more agitated leaving group several times. She then attempted to elope from unit as staff opening door. She began pushing on staff and was placed in manual hold at 0920. She was unable to be verbally redirected, pushing and fighting staff. Pt assisted to floor and CIRT called. Becky Dowakia NP and Dr. [name] present. Orders received for haldol 10mg  IM, Ativan 2mg  IM, and Benadryl 50 mg IM STAT. Pt escorted to quiet room in manual hold from 0920 to 0925. Pt was given injections, and accepted without manual hold. She continues to be paranoid, suspicious of staff and confused. Pt then requested to return to her room. Staff remain at arms length. Vitals obtained, all WNL. Becky [name] - AC present. Writer notified pt Mother [name] of above information (signed consent for restrictive intervention). Discussed patient decompensation/status with Dr. Joyce GrossAkthar/Takia NP.  Will continue to monitor q 15 minutes for safety.

## 2014-03-08 NOTE — Plan of Care (Signed)
Problem: Alteration in mood & ability to function due to Goal: STG-Patient will attend groups Outcome: Progressing Patient attended group this evening     

## 2014-03-09 DIAGNOSIS — F1290 Cannabis use, unspecified, uncomplicated: Secondary | ICD-10-CM

## 2014-03-09 MED ORDER — HYDROXYZINE HCL 25 MG PO TABS
25.0000 mg | ORAL_TABLET | Freq: Four times a day (QID) | ORAL | Status: AC | PRN
  Administered 2014-03-10 (×2): 25 mg via ORAL
  Filled 2014-03-09 (×2): qty 1

## 2014-03-09 MED ORDER — DIVALPROEX SODIUM 500 MG PO DR TAB
500.0000 mg | DELAYED_RELEASE_TABLET | Freq: Two times a day (BID) | ORAL | Status: DC
  Administered 2014-03-09 (×2): 500 mg via ORAL
  Filled 2014-03-09 (×6): qty 1

## 2014-03-09 MED ORDER — LORAZEPAM 1 MG PO TABS
1.0000 mg | ORAL_TABLET | Freq: Three times a day (TID) | ORAL | Status: AC
  Administered 2014-03-10 (×3): 1 mg via ORAL
  Filled 2014-03-09 (×3): qty 1

## 2014-03-09 MED ORDER — LORAZEPAM 1 MG PO TABS
1.0000 mg | ORAL_TABLET | Freq: Four times a day (QID) | ORAL | Status: AC
  Administered 2014-03-09 (×4): 1 mg via ORAL
  Filled 2014-03-09 (×4): qty 1

## 2014-03-09 MED ORDER — LORAZEPAM 1 MG PO TABS
1.0000 mg | ORAL_TABLET | Freq: Two times a day (BID) | ORAL | Status: AC
  Administered 2014-03-11 (×2): 1 mg via ORAL
  Filled 2014-03-09 (×2): qty 1

## 2014-03-09 MED ORDER — LORAZEPAM 1 MG PO TABS
1.0000 mg | ORAL_TABLET | Freq: Every day | ORAL | Status: AC
  Administered 2014-03-12: 1 mg via ORAL
  Filled 2014-03-09: qty 1

## 2014-03-09 MED ORDER — BUSPIRONE HCL 15 MG PO TABS
7.5000 mg | ORAL_TABLET | Freq: Two times a day (BID) | ORAL | Status: DC
  Administered 2014-03-09: 7.5 mg via ORAL
  Filled 2014-03-09 (×5): qty 1

## 2014-03-09 MED ORDER — HYDROCORTISONE 1 % EX CREA
TOPICAL_CREAM | CUTANEOUS | Status: DC | PRN
  Administered 2014-03-09 – 2014-03-10 (×4): via TOPICAL
  Filled 2014-03-09: qty 28

## 2014-03-09 MED ORDER — HALOPERIDOL LACTATE 5 MG/ML IJ SOLN
INTRAMUSCULAR | Status: AC
  Administered 2014-03-09: 2.5 mg via INTRAMUSCULAR
  Filled 2014-03-09: qty 1

## 2014-03-09 MED ORDER — ADULT MULTIVITAMIN W/MINERALS CH
1.0000 | ORAL_TABLET | Freq: Every day | ORAL | Status: DC
  Administered 2014-03-10 – 2014-03-12 (×3): 1 via ORAL
  Filled 2014-03-09 (×5): qty 1

## 2014-03-09 MED ORDER — LORAZEPAM 1 MG PO TABS
1.0000 mg | ORAL_TABLET | Freq: Four times a day (QID) | ORAL | Status: AC | PRN
  Administered 2014-03-09: 1 mg via ORAL
  Filled 2014-03-09: qty 1

## 2014-03-09 MED ORDER — NICOTINE POLACRILEX 2 MG MT GUM
2.0000 mg | CHEWING_GUM | OROMUCOSAL | Status: DC | PRN
  Administered 2014-03-09 (×5): 2 mg via ORAL
  Filled 2014-03-09 (×4): qty 1

## 2014-03-09 MED ORDER — VITAMIN B-1 100 MG PO TABS
100.0000 mg | ORAL_TABLET | Freq: Every day | ORAL | Status: DC
  Administered 2014-03-10 – 2014-03-12 (×3): 100 mg via ORAL
  Filled 2014-03-09 (×4): qty 1

## 2014-03-09 MED ORDER — THIAMINE HCL 100 MG/ML IJ SOLN: 100.0000 mg | Freq: Once | INTRAMUSCULAR | Status: AC

## 2014-03-09 MED ORDER — NICOTINE POLACRILEX 2 MG MT GUM
CHEWING_GUM | OROMUCOSAL | Status: AC
  Administered 2014-03-09: 2 mg via ORAL
  Filled 2014-03-09: qty 1

## 2014-03-09 MED ORDER — LOPERAMIDE HCL 2 MG PO CAPS
2.0000 mg | ORAL_CAPSULE | ORAL | Status: AC | PRN
Start: 2014-03-09 — End: 2014-03-12

## 2014-03-09 MED ORDER — ONDANSETRON 4 MG PO TBDP: 4.0000 mg | ORAL_TABLET | Freq: Four times a day (QID) | ORAL | Status: AC | PRN

## 2014-03-09 NOTE — [facility] Group Notes (Signed)
[facility] Group Notes:  Self inventory  Date:  03/09/2014  Time:  9:00am  Type of Therapy:  Nurse Education  Participation Level:  Active  Participation Quality:  Appropriate  Affect:  Appropriate  Cognitive:  Alert  Insight:  Appropriate  Engagement in Group:  Engaged  Modes of Intervention:  Discussion  Summary of Progress/Problems:  Nicole CellaWebb, Lesha Jager Guyes 03/09/2014, 10:15 AM

## 2014-03-09 NOTE — [facility] Group Notes (Signed)
[facility] Group Notes:  Healthy Support system  Date:  03/09/2014  Time:  9:49 AM  Type of Therapy:  Nurse Education  Participation Level:  Active  Participation Quality:  Appropriate  Affect:  Appropriate  Cognitive:  Alert  Insight:  Appropriate  Engagement in Group:  Engaged  Modes of Intervention:  Discussion  Summary of Progress/Problems:Pt stated she needs to move in with her dad to get away from the environment she is in. Her support systems are her mom and dad/  Nicole CellaWebb, Anaid Haney Guyes 03/09/2014, 9:49 AM

## 2014-03-09 NOTE — Plan of Care (Signed)
Problem: Alteration in mood & ability to function due to Goal: LTG-Pt reports reduction in suicidal thoughts (Patient reports reduction in suicidal thoughts and is able to verbalize a safety plan for whenever patient is feeling suicidal)  Outcome: Progressing Pt denies SI this shift.   Goal: STG-Patient will attend groups Outcome: Progressing Pt attended evening group on 03/08/2014.

## 2014-03-09 NOTE — Progress Notes (Addendum)
D: Pt has anxious affect and mood.  She reports "earlier I was hearing my name called and it looked like my covers were moving and I was seeing like shadows.  It felt like my mind was on overload and I tried to get out of those doors.  I feel better now."  Pt denies SI/HI, reports having auditory and visual hallucinations earlier.  Pt complained of her arms itching.  She has interacted with staff and peers appropriately this shift.  Attended evening group. A: Met with pt 1:1 and provided support and encouragement.  Administered scheduled medications and Benadryl 53m IM PRN for itching, see flowsheet.  Pt tolerated IM well, stating "thank you" after medication was administered. Safety maintained.   R: Pt is in no distress and she verbally contracts for safety.  Pt is compliant with medications.  Will continue to monitor and assess for safety.

## 2014-03-09 NOTE — Progress Notes (Signed)
Patient ID: Becky PancoastDanielle [name], female   DOB: 05/28/1990, 23 y.o.   MRN: [id number] [facility]  03/09/2014 10:15 AM Becky PancoastDanielle [name]  MRN:  [id number] Subjective:  Pt states  To be feeling somewhat better. Not agitated when i talked to her. She has been getting Prn haldol and ativan overnight.  Objective: Patient seen and chart reviewed. Patient seen in her room sitting up in bed. Pt appears to be more cooperative and is able to relay her thoughts. Yesterday we increased zyprexa despite she has been paranoid, delusional and feeling to react to internal stimuli. History also suggest she has been using infrequent but at times high doses of benzodiazepines. Quite possible some withdrawal effect. She was given ativan and haldol yesterday. Otherwise no reported side effects. Prior to admission she was on seroquel and depakote. Effexor was started in unit she is also on remeron.   She has been more disorganized and paranoid yesterday with some calm today and not endorsing hallucinations as of now.    Diagnosis:   DSM5: Primary Psychiatric Diagnosis:  Sedative hypnotic and anxiolytic(XANAX) use disorder, severe   Secondary Psychiatric Diagnosis:  Substance (xanax,cannabis,opioid ,methamphetamine) induced psychotic disorder  Cannabis use disorder, severe  Stimulant use disorder,unspecified type  Opioid use disorder,moderate  PTSD   Non Psychiatric Diagnosis:  GI ulcer  Hyperlipidemia     Total Time spent with patient: 30 minutes   ADL's:  Impaired  Sleep: Fair  Appetite:  Fair   Psychiatric Specialty Exam: Physical Exam  Constitutional: She appears well-developed.  Eyes: Pupils are equal, round, and reactive to light.  Musculoskeletal: Normal range of motion.  Neurological: She is alert.  Skin: Skin is warm and dry.    Review of Systems  Constitutional: Negative.   Neurological: Negative for dizziness and tremors.  Psychiatric/Behavioral: Negative for suicidal  ideas and hallucinations. The patient has insomnia.   All other systems reviewed and are negative.   Blood pressure 121/79, pulse 123, temperature 98.7 F (37.1 C), temperature source Oral, resp. rate 18, last menstrual period 02/01/2014.There is no weight on file to calculate BMI.  General Appearance: Fairly Groomed  Patent attorneyye Contact::  Fair  Speech:  Blocked improving  Volume:  Decreased  Mood:  Anxious and Dysphoric  Affect:  Congruent  Thought Process:  Disorganized  Orientation:  Full (Time, Place, and Person)  Thought Content:  Paranoid and sporadic hallucinations.  Suicidal Thoughts:  No  Homicidal Thoughts:  No  Memory:  Immediate;   Fair Recent;   Fair Remote;   Fair  Judgement:  Impaired  Insight:  Lacking  Psychomotor Activity:  Decreased  Concentration:  Fair  Recall:  FiservFair  Fund of Knowledge:Fair  Language: Good  Akathisia:  No    AIMS (if indicated):     Assets:  Physical Health Social Support  Sleep:  Number of Hours: 6.75   Musculoskeletal: Strength & Muscle Tone: within normal limits Gait & Station: normal Patient leans: N/A  Current Medications: Current Facility-Administered Medications  Medication Dose Route Frequency Provider Last Rate Last Dose  . alum & mag hydroxide-simeth (MAALOX/MYLANTA) 200-200-20 MG/5ML suspension 30 mL  30 mL Oral Q4H PRN Kerry HoughSpencer E Simon, PA-C      . benztropine (COGENTIN) tablet 0.5 mg  0.5 mg Oral QHS Jomarie LongsSaramma Eappen, MD   0.5 mg at 03/08/14 2056  . busPIRone (BUSPAR) tablet 7.5 mg  7.5 mg Oral BID Thresa RossNadeem Gilberte Gorley, MD      . diphenhydrAMINE (BENADRYL) injection 50 mg  50  mg Intramuscular Q8H PRN Truman Hayward, FNP   50 mg at 03/09/14 0901  . divalproex (DEPAKOTE) DR tablet 500 mg  500 mg Oral Q12H Thresa Ross, MD   500 mg at 03/09/14 0945  . feeding supplement (ENSURE COMPLETE) (ENSURE COMPLETE) liquid 237 mL  237 mL Oral BID BM Saramma Eappen, MD   237 mL at 03/04/14 1151  . haloperidol (HALDOL) tablet 2.5 mg  2.5 mg Oral Q6H  PRN Truman Hayward, FNP   2.5 mg at 03/08/14 1642  . hydrocortisone cream 1 %   Topical PRN Canary Brim, NP      . hydrOXYzine (ATARAX/VISTARIL) tablet 25 mg  25 mg Oral Q6H PRN Thresa Ross, MD      . loperamide (IMODIUM) capsule 2-4 mg  2-4 mg Oral PRN Thresa Ross, MD      . LORazepam (ATIVAN) tablet 1 mg  1 mg Oral Q6H PRN Truman Hayward, FNP   1 mg at 03/09/14 0809  . LORazepam (ATIVAN) tablet 1 mg  1 mg Oral Q6H PRN Thresa Ross, MD      . LORazepam (ATIVAN) tablet 1 mg  1 mg Oral QID Thresa Ross, MD   1 mg at 03/09/14 0945   Followed by  . [START ON 03/10/2014] LORazepam (ATIVAN) tablet 1 mg  1 mg Oral TID Thresa Ross, MD       Followed by  . [START ON 03/11/2014] LORazepam (ATIVAN) tablet 1 mg  1 mg Oral BID Thresa Ross, MD       Followed by  . [START ON 03/12/2014] LORazepam (ATIVAN) tablet 1 mg  1 mg Oral Daily Thresa Ross, MD      . magnesium hydroxide (MILK OF MAGNESIA) suspension 30 mL  30 mL Oral Daily PRN Kerry Hough, PA-C      . mirtazapine (REMERON SOL-TAB) disintegrating tablet 15 mg  15 mg Oral QHS Jomarie Longs, MD   15 mg at 03/08/14 2056  . multivitamin with minerals tablet 1 tablet  1 tablet Oral Daily Thresa Ross, MD      . nicotine polacrilex (NICORETTE) gum 2 mg  2 mg Oral PRN Canary Brim, NP   2 mg at 03/09/14 0917  . OLANZapine zydis (ZYPREXA) disintegrating tablet 10 mg  10 mg Oral BID Truman Hayward, FNP   10 mg at 03/09/14 0759  . ondansetron (ZOFRAN-ODT) disintegrating tablet 4 mg  4 mg Oral Q6H PRN Thresa Ross, MD      . pantoprazole (PROTONIX) EC tablet 20 mg  20 mg Oral BID AC Saramma Eappen, MD   20 mg at 03/09/14 0759  . prazosin (MINIPRESS) capsule 1 mg  1 mg Oral QHS Jomarie Longs, MD   1 mg at 03/08/14 2056  . simvastatin (ZOCOR) tablet 20 mg  20 mg Oral q1800 Jomarie Longs, MD   20 mg at 03/08/14 1601  . thiamine (B-1) injection 100 mg  100 mg Intramuscular Once Jomarie Longs, MD      . Melene Muller ON 03/10/2014] thiamine  (VITAMIN B-1) tablet 100 mg  100 mg Oral Daily Thresa Ross, MD        Lab Results:  No results found for this or any previous visit (from the past 48 hour(s)).  Physical Findings: AIMS: Facial and Oral Movements Muscles of Facial Expression: None, normal Lips and Perioral Area: None, normal Jaw: None, normal Tongue: None, normal,Extremity Movements Upper (arms, wrists, hands, fingers): None, normal Lower (legs, knees, ankles, toes): None,  normal, Trunk Movements Neck, shoulders, hips: None, normal, Overall Severity Severity of abnormal movements (highest score from questions above): None, normal Incapacitation due to abnormal movements: None, normal Patient's awareness of abnormal movements (rate only patient's report): No Awareness, Dental Status Current problems with teeth and/or dentures?: No Does patient usually wear dentures?: No  CIWA:  CIWA-Ar Total: 0 COWS:     Treatment Plan Summary: Daily contact with patient to assess and evaluate symptoms and progress in treatment Medication management  Assessment: Patient is a 23 year old CF with parnoia and delusions. Medications to be adjusted as below.  Plan: Will stop effexor. Lower buspar to bid considering possiblity of bipolar. Will continue Remeron 15 mg po qhs for sleep.  Continue Zyprexa Zydis 10 mg po BID for agitation. Will continue Prazosin 1 mg po qhs for nightmares. Will continue Cogentin po for EPS. Start ativan protocol for possible benzodiazepine use and withdrawals exacerbating paranoia. Start depakote 500mg  bid for Bipolar and augmentaion. Pt to be referred to a substance abuse program once stable. Monitor for vitals and behavior.    Medical Decision Making Problem Points:  Established problem, stable/improving (1) and Review of last therapy session (1) Data Points:  Order Aims Assessment (2) Review or order clinical lab tests (1) Review of medication regiment & side effects (2) Review of new medications  or change in dosage (2)  I certify that inpatient services furnished can reasonably be expected to improve the patient's condition.   Gilmore LarocheAKHTAR, Cheyrl Buley MD  03/09/2014, 10:15 AM

## 2014-03-09 NOTE — Progress Notes (Signed)
[facility] Group Notes:  (Nursing/MHT/Case Management/Adjunct)  Date:  03/09/2014  Time:  9:51 PM  Type of Therapy:  Psychoeducational Skills  Participation Level:  Active  Participation Quality:  Appropriate  Affect:  Flat  Cognitive:  Appropriate  Insight:  Improving  Engagement in Group:  Improving  Modes of Intervention:  Education  Summary of Progress/Problems: The patient shared with the group this evening that she had a good day. She indicated that she feels better but would not go into greater detail. Appears less distracted and preoccupied today. The patient states that she has some preliminary discharge plans in place.   Hazle CocaGOODMAN, Dorin Stooksbury S 03/09/2014, 9:51 PM

## 2014-03-09 NOTE — Progress Notes (Signed)
Patient ID: Becky [name], female   DOB: 07/19/1990, 23 y.o.   MRN: [id number]  D: Per self inventory, patient slept fair last night. Pt received sleep medicine but reports it was not helpful. Pt reports a low energy level and good concentration. Pt has a fair appetite today. Pt rates depression 8, anxiety 10 and hopelessness 2. Pt complaining of agitation, irritability and confusion. Pts goal today is to "Get the right medications going." and plans to do so by "talked to doctor." Pt denies pain. Pt complained of an itchy, erythematic rash on anterior chest and neck this morning.   A: Product managerWriter and Shalita, RN spoke with the provider about medication changes. Pt supported and emotionally encouraged. Pt self empowered by being involved in treatment and self-care.   R: Pt remains safe with 15 minute checks. MD discontinued Effexor. Buspar now twice a day. Depakote 500 mg twice a day started. Ativan protocol started at 0900. Pt currently denies SI/HI and verbally agrees to come to staff in this event. Pt reports being less confused and has had increased logical thinking throughout the day. Pt is no longer responding to any internal stimuli and denies having A/V hallucinations.   Becky [name], Becky Harten E, RN

## 2014-03-09 NOTE — [facility] Group Notes (Signed)
[facility] Group Notes:  (Clinical Social Work)  03/09/2014   11:15am-12:00pm  Summary of Progress/Problems:  The main focus of today's process group was to listen to a variety of genres of music and to identify that different types of music provoke different responses.  The patient then was able to identify personally what was soothing for them, as well as energizing.   The patient expressed understanding of concepts, as well as knowledge of how each type of music affected her and how this can be used at home as a wellness/recovery tool.  She stated that her prevailing feeling at the beginning of group was anxiety, measured at 9 out of 10.  At the end of group, she stated it was 10 out of 10, but it would have been lower if she had been familiar with the very last song played.  At various times during group she smiled when she knew the songs and could sing along.  Type of Therapy:  Music Therapy   Participation Level:  Active  Participation Quality:  Attentive and Sharing  Affect:  Blunted  Cognitive:  Oriented  Insight:  Engaged  Engagement in Therapy:  Engaged  Modes of Intervention:   Activity, Exploration  Ambrose MantleMareida Grossman-Orr, LCSW 03/09/2014, 12:30pm

## 2014-03-09 NOTE — Progress Notes (Signed)
D:  Pt reports her day has been "really good."  Pt stated "they started me back on my Depakote and put me on an Ativan taper and it's really helping.  This is the normalest I've felt since I've been here."  Pt stated "what if I can't sleep later?  I slept like 3 or 4 hours today."  Pt denies SI/HI, denies hallucinations.  She interacts with staff and peers appropriately and attended evening group. A: Met with pt 1:1 and provided support and encouragement.  Medications administered per order.  Safety maintained. R: Pt had not difficulty falling asleep.  She is in no distress and verbally contracted for safety.  She is compliant with scheduled medications.  Will continue to monitor and assess for safety.

## 2014-03-10 DIAGNOSIS — F19951 Other psychoactive substance use, unspecified with psychoactive substance-induced psychotic disorder with hallucinations: Secondary | ICD-10-CM

## 2014-03-10 DIAGNOSIS — F1994 Other psychoactive substance use, unspecified with psychoactive substance-induced mood disorder: Secondary | ICD-10-CM

## 2014-03-10 MED ORDER — MIRTAZAPINE 7.5 MG PO TABS
7.5000 mg | ORAL_TABLET | Freq: Every day | ORAL | Status: DC
  Administered 2014-03-10 – 2014-03-11 (×2): 7.5 mg via ORAL
  Filled 2014-03-10 (×3): qty 1

## 2014-03-10 MED ORDER — GABAPENTIN 100 MG PO CAPS
100.0000 mg | ORAL_CAPSULE | Freq: Three times a day (TID) | ORAL | Status: DC
  Administered 2014-03-10 – 2014-03-12 (×8): 100 mg via ORAL
  Filled 2014-03-10 (×11): qty 1

## 2014-03-10 MED ORDER — DIVALPROEX SODIUM ER 250 MG PO TB24
750.0000 mg | ORAL_TABLET | Freq: Every evening | ORAL | Status: DC
  Administered 2014-03-10 – 2014-03-11 (×2): 750 mg via ORAL
  Filled 2014-03-10 (×3): qty 3

## 2014-03-10 MED ORDER — TRAZODONE HCL 50 MG PO TABS
50.0000 mg | ORAL_TABLET | Freq: Every evening | ORAL | Status: DC | PRN
  Administered 2014-03-10 – 2014-03-11 (×2): 50 mg via ORAL
  Filled 2014-03-10: qty 28
  Filled 2014-03-10 (×2): qty 1

## 2014-03-10 MED ORDER — NICOTINE 21 MG/24HR TD PT24
21.0000 mg | MEDICATED_PATCH | Freq: Every day | TRANSDERMAL | Status: DC
  Administered 2014-03-10 – 2014-03-12 (×3): 21 mg via TRANSDERMAL
  Filled 2014-03-10 (×5): qty 1

## 2014-03-10 NOTE — [facility] Group Notes (Signed)
[facility] LCSW Group Therapy  03/10/2014 1:15 pm  Type of Therapy: Process Group Therapy  Participation Level:  Active  Participation Quality:  Appropriate  Affect:  Flat  Cognitive:  Oriented  Insight:  Improving  Engagement in Group:  Limited  Engagement in Therapy:  Limited  Modes of Intervention:  Activity, Clarification, Education, Problem-solving and Support  Summary of Progress/Problems: Today's group addressed the issue of overcoming obstacles.  Patients were asked to identify their biggest obstacle post d/c that stands in the way of their on-going success, and then problem solve as to how to manage this.  "I need to change my number so the dealers can't call me.  I have been stealing my mother's meds since I have been helping take care of her, and that's a problem.  In order to get clean, I have to ask my dad if I can stay with him.  Then I would not face as much temptation when I visited my mom, especially if a CNA was in charge of her meds."  Good insight for someone who has demonstrated as much though blocking paranoia as she has.  [name], Becky [name] 03/10/2014   3:23 PM

## 2014-03-10 NOTE — Progress Notes (Signed)
Patient ID: Becky PancoastDanielle [name], female   DOB: 02/09/1991, 23 y.o.   MRN: [id number] [facility]  03/10/2014 9:18 AM Becky [name]  MRN:  [id number] Subjective:  Pt states " I still feel paranoid ,I hear voices ,I heard them in the AM when I woke up they realized I was awake ,so they calmed down a little bit ,but then I feel that it could be some one talking in the hallway.'  Objective: Patient seen and chart reviewed. Patient appears to be more interactive ,but continues to be disorganized ,paranoid as well as endorses voices. Pt over the week end had an episode when she became really agitated ,paranoid and hence was started on a mood stabilizer Depakote as well as her Effexor was DC ed and Buspar was decreased. Pt however reports today as a 'better day". Pt per staff however is very paranoid. Pt has been questioning staff about the phone calls they make and is concerned whether they are talking about her. Pt also per staff has 'thought blocking " and appears to be delusional. Pt is on Ativan protocol /CIWA currently . Pt today endorses 'tremors on her Bl hands'. Pt denies any other sx ,but reports being "confused ' unable to elaborate. Pt is oriented x3. Pt denies SI/HI/VH. Pt reports sleep and appetite as good. Pt denies any side effects of medications.  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  Eyes: Pupils are equal, round, and reactive to light.  Musculoskeletal: Normal range of motion.  Neurological: She is alert.  Skin: Skin is warm and dry.    Review of Systems  Constitutional: Negative.   Neurological: Positive for tremors. Negative for dizziness.  Psychiatric/Behavioral: Positive for hallucinations. Negative for suicidal ideas. The patient is nervous/anxious. The patient does not have insomnia.   All other systems reviewed and are negative.   Blood pressure 116/65, pulse 76, temperature 98.6 F (37 C), temperature source Oral, resp. rate 16, last menstrual period 02/01/2014.There is no weight on file to calculate BMI.  General Appearance: Fairly Groomed  Patent attorneyye Contact::  Fair  Speech:  Blocked improving  Volume:  Decreased  Mood:  Anxious and Dysphoric  Affect:  Congruent  Thought Process:  Disorganized  Orientation:  Full (Time, Place, and Person)  Thought Content:  Paranoid and sporadic hallucinations.  Suicidal Thoughts:  No  Homicidal Thoughts:  No  Memory:  Immediate;   Fair Recent;   Fair Remote;   Fair  Judgement:  Impaired  Insight:  Lacking  Psychomotor Activity:  Decreased  Concentration:  Fair  Recall:  FiservFair  Fund of Knowledge:Fair  Language: Good  Akathisia:  No    AIMS (if indicated):     Assets:  Physical Health Social Support  Sleep:  Number of Hours: 6.5   Musculoskeletal: Strength & Muscle Tone: within normal limits Gait & Station: normal Patient leans: N/A  Current Medications: Current Facility-Administered Medications  Medication Dose Route Frequency Provider Last  Rate Last Dose  . alum & mag hydroxide-simeth (MAALOX/MYLANTA) 200-200-20 MG/5ML suspension 30 mL  30 mL Oral Q4H PRN Kerry Hough, PA-C      . benztropine (COGENTIN) tablet 0.5 mg  0.5 mg Oral QHS Jomarie Longs, MD   0.5 mg at 03/09/14 2111  . diphenhydrAMINE (BENADRYL) injection 50 mg  50  mg Intramuscular Q8H PRN Truman Hayward, FNP   50 mg at 03/09/14 0901  . divalproex (DEPAKOTE ER) 24 hr tablet 750 mg  750 mg Oral QPM Thos Matsumoto, MD      . feeding supplement (ENSURE COMPLETE) (ENSURE COMPLETE) liquid 237 mL  237 mL Oral BID BM Desyre Calma, MD   237 mL at 03/10/14 0845  . gabapentin (NEURONTIN) capsule 100 mg  100 mg Oral TID Jomarie Longs, MD   100 mg at 03/10/14 0844  . haloperidol (HALDOL) tablet 2.5 mg  2.5 mg Oral Q6H PRN Truman Hayward, FNP   2.5 mg at 03/08/14 1642  . hydrocortisone cream 1 %   Topical PRN Canary Brim, NP      . hydrOXYzine (ATARAX/VISTARIL) tablet 25 mg  25 mg Oral Q6H PRN Thresa Ross, MD      . loperamide (IMODIUM) capsule 2-4 mg  2-4 mg Oral PRN Thresa Ross, MD      . LORazepam (ATIVAN) tablet 1 mg  1 mg Oral Q6H PRN Truman Hayward, FNP   1 mg at 03/09/14 0809  . LORazepam (ATIVAN) tablet 1 mg  1 mg Oral Q6H PRN Thresa Ross, MD   1 mg at 03/09/14 1552  . LORazepam (ATIVAN) tablet 1 mg  1 mg Oral TID Thresa Ross, MD   1 mg at 03/10/14 0845   Followed by  . [START ON 03/11/2014] LORazepam (ATIVAN) tablet 1 mg  1 mg Oral BID Thresa Ross, MD       Followed by  . [START ON 03/12/2014] LORazepam (ATIVAN) tablet 1 mg  1 mg Oral Daily Thresa Ross, MD      . magnesium hydroxide (MILK OF MAGNESIA) suspension 30 mL  30 mL Oral Daily PRN Kerry Hough, PA-C      . mirtazapine (REMERON) tablet 7.5 mg  7.5 mg Oral QHS Jomarie Longs, MD      . multivitamin with minerals tablet 1 tablet  1 tablet Oral Daily Thresa Ross, MD   1 tablet at 03/10/14 0844  . nicotine (NICODERM CQ - dosed in mg/24 hours) patch 21 mg  21 mg Transdermal Daily Ilithyia Titzer, MD      . OLANZapine zydis (ZYPREXA) disintegrating tablet 10 mg  10 mg Oral BID Truman Hayward, FNP   10 mg at 03/10/14 0844  . ondansetron (ZOFRAN-ODT) disintegrating tablet 4 mg  4 mg Oral Q6H PRN Thresa Ross, MD      . pantoprazole (PROTONIX) EC tablet 20 mg  20 mg Oral BID AC Shabria Egley, MD   20 mg at 03/10/14 0844  . prazosin (MINIPRESS) capsule 1 mg  1 mg Oral QHS Jomarie Longs, MD   1 mg at 03/09/14 2111  . simvastatin (ZOCOR) tablet 20 mg  20 mg Oral q1800 Jomarie Longs, MD   20 mg at 03/09/14 1717  . thiamine (B-1) injection 100 mg  100 mg Intramuscular Once Jomarie Longs, MD      . thiamine (VITAMIN B-1) tablet 100 mg  100 mg Oral Daily Thresa Ross, MD   100 mg at 03/10/14 [phone]  Lab Results:  No results found for this or any previous visit (from the past 48 hour(s)).  Physical Findings: AIMS: Facial and Oral Movements Muscles of Facial Expression: None, normal Lips and Perioral Area: None, normal Jaw: None, normal Tongue: None, normal,Extremity Movements Upper (arms, wrists, hands, fingers): None, normal Lower (legs, knees, ankles, toes): None, normal, Trunk Movements Neck, shoulders, hips: None, normal, Overall Severity Severity of abnormal movements (highest score from questions above): None, normal Incapacitation due to abnormal movements: None, normal Patient's awareness of abnormal movements (rate only patient's report): No Awareness, Dental Status Current problems with teeth and/or dentures?: No Does patient usually wear dentures?: No  CIWA:  CIWA-Ar Total: 0 COWS:     Treatment Plan Summary: Daily contact with patient to assess and evaluate symptoms and progress in treatment Medication management  Assessment: Patient is a 23 year old CF with paranoia and delusions. Medications to be adjusted as below.  Plan: Will change Depakote to ER and make it 750 mg po qpm for mood lability.  Will add Gabapentin 100 mg po tid for anxiety sx. Will reduce Remeron to 7.5  po qhs for sleep.  Continue Zyprexa Zydis 10 mg po BID for psychosis. Will continue Prazosin 1 mg po qhs for nightmares. Will continue Cogentin po for EPS. Continue ativan protocol for possible benzodiazepine use and withdrawals exacerbating paranoia. Pt to be referred to a  substance abuse program once stable. Monitor for vitals and behavior.    Medical Decision Making Problem Points:  Established problem, stable/improving (1) and Review of last therapy session (1) Data Points:  Order Aims Assessment (2) Review or order clinical lab tests (1) Review of medication regiment & side effects (2) Review of new medications or change in dosage (2)  I certify that inpatient services furnished can reasonably be expected to improve the patient's condition.   Colburn Asper MD  03/10/2014, 9:18 AM

## 2014-03-10 NOTE — Plan of Care (Signed)
Problem: Ineffective individual coping Goal: STG-Increase in ability to manage activities of daily living Outcome: Progressing Patient appears to have good hygiene and grooming behaviors. Patient is able to care for herself as far as ADL's are concerned at this time.

## 2014-03-10 NOTE — Progress Notes (Signed)
Patient ID: [name], female   DOB: 08/11/1990, 23 y.o.   MRN: [id number]  D: Pt. Denies SI/HI and A/V Hallucinations. Patient rates her depression and anxiety at 10/10 for the day. Patient rates her hopelessness at 2/10 for the day. Patient does not report any pain or discomfort at this time. Patient seems to be thought blocking when staff is speaking to her. Patient continues to be paranoid. Patient wrote on daily inventory sheet, "whats goin on with all the wispering behind my back?" Patient was reassured that people are not speaking of her behind her back. Writer called pharmacy about patient's medications and when writer got off the phone patient said, "what did you say about me?" Writer reassured patient that she was just discussing her medications.   A: Support and encouragement provided to the patient throughout the day and patient was redirected as well. Scheduled medications administered to patient per physician's orders.  R: Patient is receptive and cooperative but seems to be easily forgetful at times. Writer will speak to patient about medications and patient will not remember anything Clinical research associatewriter stated. Patient is seen in the milieu at times and is attending some groups. Q15 minute checks are maintained for safety.

## 2014-03-10 NOTE — [facility] Group Notes (Signed)
[facility]   03/10/2014 10:48 AM  Participation Quality:  Minimal   Mood/Affect:  Parnoid/Guarded   Depression Rating:  "high"   Anxiety Rating:  "high"   Thoughts of Suicide:  No Will you contract for safety?   NA  Current AVH:  No  Plan for Discharge/Comments:  Pt reports that she plans [facility] return home [facility]. Pt stated that she feels "like everyone is talking about me." Pt mood is labile this morning and she demonstrates paranoid ideation toward other pts and CSW. Pt requesting to see everything written about her.   Transportation Means: unknown at this time "my mom's car is broken down." CSW assessing.   Supports: Government social research officermother/aunt   Smart, OncologistHeather LCSWA

## 2014-03-10 NOTE — Progress Notes (Addendum)
D: Pt presents flat in affect but brightens upon approach. Pt is looking forward to discharging soon. She plans on following up with [facility] appropriately. She is reporting inadequate sleep. " The ativan hypes me up". "Benzos always keep my hyped up". "Trazodone will work for me". Pt reports taking trazodone 100 mg for sleep. She is denying any SI/HI/AVH. "I've been doing better than the past couple of days". Pt observed interacting approprietly with staff and the other patients. Pt initially reported that she was itching. There were no bumps or discoloration present on arms. She reports that it is common for her to itch. She was reluctant to this writer ordering anything for her in fear that it would prolonged her stay. " I'm not having any reactions to my medications".  A: Writer administered scheduled and prn medications to pt. Continued support and availability as needed was extended to this pt. Staff continue to monitor pt with q2315min checks.  R: No adverse drug reactions noted. Pt receptive to treatment. Pt remains safe at this time.  Update: There were small red bumps present on her lower left arm. Pt reports that she had some flea bites in that area prior to arrival. Pt received some hydrocortisone cream. Pt reports relief after use.

## 2014-03-10 NOTE — Plan of Care (Signed)
Problem: Alteration in thought process Goal: STG-Patient is able to follow short directions Outcome: Progressing Pt has been able to follow directions this shift.       

## 2014-03-10 NOTE — Progress Notes (Signed)
The focus of this group is to help patients review their daily goal of treatment and discuss progress on daily workbooks. Pt attended the evening group session and responded to all discussion prompts from the Writer. Pt shared that today was a good day on the unit, the highlight of which was learning she may discharge on Wednesday. Pt told the group she planned on staying well upon discharge by taking her medications as prescribed. Pt's affect in group was appropriate, though she required re-direction several times to keep from holding side conversations.

## 2014-03-10 NOTE — Clinical Social Work Note (Signed)
CSW spoke with pt's mother, Becky [name]. Pt's mother reported that pt had bad weekend, being paranoid, suspicious, and "thinking everyone is talking about her." Pt's mother concerned that pt is not improving and is worried that [facility] cannot [name] if more improvement is not made. CSW gave pt's mother update regarding pt mood-still paranoid but able to manage her mood somewhat better, without acting out today and explained that new medications were started today. CSW reviewed pt d/c plan and will call Gwen tomorrow to update.   The Sherwin-WilliamsHeather Smart, LCSWA 03/10/2014 1:38 PM

## 2014-03-11 MED ORDER — PROPRANOLOL HCL 20 MG PO TABS
20.0000 mg | ORAL_TABLET | Freq: Two times a day (BID) | ORAL | Status: DC
  Administered 2014-03-11 – 2014-03-12 (×3): 20 mg via ORAL
  Filled 2014-03-11 (×2): qty 1
  Filled 2014-03-11: qty 2
  Filled 2014-03-11 (×3): qty 1

## 2014-03-11 NOTE — Progress Notes (Signed)
The focus of this group is to help patients review their daily goal of treatment and discuss progress on daily workbooks. Pt attended the evening group session and responded to all discussion prompts from the Writer. Pt said that today was a good day, the highlight of which was learning she may go home tomorrow. Pt said the only thing she needed tonight was to go to sleep. Pt did not appear engaged in the group and was eager to finish it as evidenced by comments she made when others spoke. Pt was called out on this several times and asked not to speak when others were speaking. Pt's affect was otherwise appropriate.

## 2014-03-11 NOTE — Plan of Care (Signed)
Problem: Ineffective individual coping Goal: LTG: Patient will report a decrease in negative feelings Outcome: Progressing Pt verbalized techniques to help decrease anxiety such as deep breathing and talking to staff.

## 2014-03-11 NOTE — [facility] Group Notes (Signed)
[facility] Group Notes:  (Nursing/MHT/Case Management/Adjunct)  Date:  03/11/2014  Time:  0900  Type of Therapy:  Nurse Education  Participation Level:  Active  Participation Quality:  Appropriate  Affect:  Appropriate  Cognitive:  Appropriate  Insight:  Appropriate  Engagement in Group:  Engaged  Modes of Intervention:  Education  Summary of Progress/Problems:  Jaleah Lefevre L 03/11/2014, 11:06 AM

## 2014-03-11 NOTE — Progress Notes (Signed)
Adult Psychoeducational Group Note  Date:  03/11/2014 Time:  10:39 AM  Group Topic/Focus:  Therapeutic Activity   Participation Level:  Minimal  Participation Quality:  Appropriate  Affect:  Appropriate  Cognitive:  Appropriate  Insight: Good  Engagement in Group:  Improving  Modes of Intervention:  Activity, Discussion and Socialization  Additional Comments:   Today's therapeutic activity was Electrical engineerCoping Skills Pictionary. This was a fun way to identify coping skills to use in stressful situations.   Tora PerchesMercer, Shenee Wignall N 03/11/2014, 10:39 AM

## 2014-03-11 NOTE — Progress Notes (Signed)
Patient ID: Becky [name], female   DOB: 1990-12-25, 25 y.o.   MRN: [id number] [facility] MD Progress Note  03/11/2014 9:02 AM Shea Swalley  MRN:  [id number] Subjective:  Pt states " I am OK ,I need to work on my addiction. My family as well as my boyfriend has told me that I cannot do drugs anymore or else they will not support me any more." Objective: Patient seen and chart reviewed. Patient appears to be more interactive ,is less disorganized ,more goal directed. Pt denies any paranoia and feels much improvement with her mood sx. Pt vaguely remembers what happened when she came in and feels that "I saw people all dressed up weird ways when I came here the first day, All I took was a methadone ,xanax and some muscle relaxants." Pt is on Ativan protocol /CIWA currently . Pt today endorses 'tremors on her Bl hands'. However when asked reports "I had tremors ever since I can remember ,I took only 1 xanax pill the day before I came in ." Pt is oriented x3. Pt denies SI/HI/VH.Pt denies any AH today. Pt reports her room mates is giving her a lot of trouble and would like to move to a new room. Pt is also worried that one of the nursing staff knows her family and she is worried that she would go out and talk about her. Pt provided reassurance and discussed that it is a violation of HIPA . Pt reports sleep and appetite as good. Pt denies any side effects of medications. Pt would like to go for AA meetings on the substance abuse unit.   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  Eyes: Pupils are equal, round, and reactive to light.  Musculoskeletal: Normal range of motion.  Neurological: She is alert.  Skin: Skin is warm and dry.    Review of Systems  Constitutional: Negative.   Neurological: Positive for tremors. Negative for dizziness.  Psychiatric/Behavioral: Negative for suicidal ideas. The patient is nervous/anxious. The patient does not have insomnia.   All other systems reviewed and are negative.   Blood pressure 109/58, pulse 123, temperature 98.5 F (36.9 C), temperature source Oral, resp. rate 20, last menstrual period 02/01/2014.There is no weight on file to calculate BMI.  General Appearance: Fairly Groomed  Patent attorney::  Fair  Speech:  Blocked improving  Volume:  Decreased  Mood:  Anxious and Dysphoric  Affect:  Congruent  Thought Process:  Disorganized  Orientation:  Full (Time, Place, and Person)  Thought Content: more organized,denies AH/VH  Suicidal Thoughts:  No  Homicidal Thoughts:  No  Memory:  Immediate;   Fair Recent;   Fair Remote;   Fair  Judgement:  Impaired  Insight:  Lacking  Psychomotor Activity:  Decreased  Concentration:  Fair  Recall:  [employer] of Knowledge:Fair  Language: Good  Akathisia:  No    AIMS (if indicated):     Assets:  Physical Health Social  Support  Sleep:  Number of Hours: 6.75   Musculoskeletal: Strength & Muscle Tone: within normal limits Gait & Station: normal Patient leans: N/A  Current Medications: Current Facility-Administered Medications  Medication Dose Route Frequency Provider Last Rate Last Dose  . alum & mag hydroxide-simeth (MAALOX/MYLANTA) 200-200-20 MG/5ML suspension 30 mL  30 mL Oral Q4H PRN Kerry HoughSpencer E Simon, PA-C      . benztropine (COGENTIN)  tablet 0.5 mg  0.5 mg Oral QHS Jomarie LongsSaramma Noni Stonesifer, MD   0.5 mg at 03/10/14 2104  . diphenhydrAMINE (BENADRYL) injection 50 mg  50 mg Intramuscular Q8H PRN Truman Haywardakia S Starkes, FNP   50 mg at 03/09/14 0901  . divalproex (DEPAKOTE ER) 24 hr tablet 750 mg  750 mg Oral QPM Bj Morlock, MD   750 mg at 03/10/14 1701  . feeding supplement (ENSURE COMPLETE) (ENSURE COMPLETE) liquid 237 mL  237 mL Oral BID BM Yannely Kintzel, MD   237 mL at 03/10/14 0845  . gabapentin (NEURONTIN) capsule 100 mg  100 mg Oral TID Jomarie LongsSaramma Alice Vitelli, MD   100 mg at 03/11/14 0749  . haloperidol (HALDOL) tablet 2.5 mg  2.5 mg Oral Q6H PRN Truman Haywardakia S Starkes, FNP   2.5 mg at 03/08/14 1642  . hydrocortisone cream 1 %   Topical PRN Canary BrimMary W Larach, NP      . hydrOXYzine (ATARAX/VISTARIL) tablet 25 mg  25 mg Oral Q6H PRN Thresa RossNadeem Akhtar, MD   25 mg at 03/10/14 2150  . loperamide (IMODIUM) capsule 2-4 mg  2-4 mg Oral PRN Thresa RossNadeem Akhtar, MD      . LORazepam (ATIVAN) tablet 1 mg  1 mg Oral Q6H PRN Truman Haywardakia S Starkes, FNP   1 mg at 03/09/14 0809  . LORazepam (ATIVAN) tablet 1 mg  1 mg Oral Q6H PRN Thresa RossNadeem Akhtar, MD   1 mg at 03/09/14 1552  . LORazepam (ATIVAN) tablet 1 mg  1 mg Oral BID Thresa RossNadeem Akhtar, MD   1 mg at 03/11/14 0748   Followed by  . [START ON 03/12/2014] LORazepam (ATIVAN) tablet 1 mg  1 mg Oral Daily Thresa RossNadeem Akhtar, MD      . magnesium hydroxide (MILK OF MAGNESIA) suspension 30 mL  30 mL Oral Daily PRN Kerry HoughSpencer E Simon, PA-C      . mirtazapine (REMERON) tablet 7.5 mg  7.5 mg Oral QHS Jomarie LongsSaramma Prince Couey, MD   7.5 mg at 03/10/14 2104  . multivitamin with minerals tablet 1 tablet  1 tablet Oral Daily Thresa RossNadeem Akhtar, MD   1 tablet at 03/11/14 0749  . nicotine (NICODERM CQ - dosed in mg/24 hours) patch 21 mg  21 mg Transdermal Daily Jomarie LongsSaramma Loys Hoselton, MD   21 mg at 03/11/14 0844  . OLANZapine zydis (ZYPREXA) disintegrating tablet 10 mg  10 mg Oral BID Truman Haywardakia S Starkes, FNP   10 mg at 03/11/14 0748  . ondansetron (ZOFRAN-ODT) disintegrating tablet 4 mg  4 mg  Oral Q6H PRN Thresa RossNadeem Akhtar, MD      . pantoprazole (PROTONIX) EC tablet 20 mg  20 mg Oral BID AC Jomarie LongsSaramma Kadarious Dikes, MD   20 mg at 03/11/14 0640  . prazosin (MINIPRESS) capsule 1 mg  1 mg Oral QHS Jomarie LongsSaramma Guadalupe Nickless, MD   1 mg at 03/10/14 2104  . propranolol (INDERAL) tablet 20 mg  20 mg Oral BID Jomarie LongsSaramma Travelle Mcclimans, MD      . simvastatin (ZOCOR) tablet 20 mg  20 mg Oral q1800 Gaius Ishaq, MD   20 mg at 03/10/14 1701  .  thiamine (B-1) injection 100 mg  100 mg Intramuscular Once Jomarie LongsSaramma Trevaun Rendleman, MD      . thiamine (VITAMIN B-1) tablet 100 mg  100 mg Oral Daily Thresa RossNadeem Akhtar, MD   100 mg at 03/11/14 0749  . traZODone (DESYREL) tablet 50 mg  50 mg Oral QHS PRN,MR X 1 Kerry HoughSpencer E Simon, PA-C   50 mg at 03/10/14 2151    Lab Results:  No results found for this or any previous visit (from the past 48 hour(s)).  Physical Findings: AIMS: Facial and Oral Movements Muscles of Facial Expression: None, normal Lips and Perioral Area: None, normal Jaw: None, normal Tongue: None, normal,Extremity Movements Upper (arms, wrists, hands, fingers): None, normal Lower (legs, knees, ankles, toes): None, normal, Trunk Movements Neck, shoulders, hips: None, normal, Overall Severity Severity of abnormal movements (highest score from questions above): None, normal Incapacitation due to abnormal movements: None, normal Patient's awareness of abnormal movements (rate only patient's report): No Awareness, Dental Status Current problems with teeth and/or dentures?: No Does patient usually wear dentures?: No  CIWA:  CIWA-Ar Total: 2 COWS:     Treatment Plan Summary: Daily contact with patient to assess and evaluate symptoms and progress in treatment Medication management  Assessment: Patient is a 23 year old CF with paranoia and delusions. Medications to be adjusted as below.  Plan: Will continue Depakote ER and 750 mg po qpm for mood lability.  Will continue Gabapentin 100 mg po tid for anxiety sx. Will continue Remeron  to 7.5  po qhs for sleep.  Continue Zyprexa Zydis 10 mg po BID for psychosis. Will continue Prazosin 1 mg po qhs for nightmares. Will continue Cogentin po for EPS. Continue ativan protocol for possible benzodiazepine use and withdrawals exacerbating paranoia. Pt to be referred to a substance abuse program once stable.Pt to be allowed to attend AA meetings on the substance abuse unit -300 hall. Monitor for vitals and behavior. Will add Propranolol 20 mg po bid for tremors as well as tachycardia.   Medical Decision Making Problem Points:  Established problem, stable/improving (1) and Review of last therapy session (1) Data Points:  Order Aims Assessment (2) Review or order clinical lab tests (1) Review of medication regiment & side effects (2) Review of new medications or change in dosage (2)  I certify that inpatient services furnished can reasonably be expected to improve the patient's condition.   Chaniyah Jahr MD  03/11/2014, 9:02 AM

## 2014-03-11 NOTE — Progress Notes (Addendum)
D: Pt anxious on approach. Pt stated that her roommate has been getting on her nerves daily. Pt irritable and requested to change rooms. Pt denies SI/HI/AVH. Pt rates anxiety 8/10 and depression 7/10. Pt denies having any withdrawal symptoms but writer observed pt to be easily agitated and expericing bilateral hand tremors. Pt HR elevated this morning and rechecked. Pt given new med propranolol as ordered per MD. Pt compliant with taking meds and attending groups. Pt refused ensure d/t improved appetite. Pt paranoid this morning, watchful and repeatedly asked the nurse if someone is talking about her.  A: Medications administered as ordered per MD. Verbal support given. Pt encouraged to attend groups. Pt moved to another room at pts request. 15 minute checks performed for safety. R: Pt safety maintained. Pt receptive to care.

## 2014-03-11 NOTE — Plan of Care (Signed)
Problem: Ineffective individual coping Goal: STG: Patient will remain free from self harm Outcome: Progressing Pt denies SI  and contracts for safety. Pt agrees to verbalize any changes in symptoms to staff.

## 2014-03-11 NOTE — [facility] Group Notes (Signed)
[facility] LCSW Group Therapy  03/11/2014 , 1:52 PM   Type of Therapy:  Group Therapy  Participation Level:  Active  Participation Quality:  Attentive  Affect:  Appropriate  Cognitive:  Alert  Insight:  Improving  Engagement in Therapy:  Engaged  Modes of Intervention:  Discussion, Exploration and Socialization  Summary of Progress/Problems: Today's group focused on the term Diagnosis.  Participants were asked to define the term, and then pronounce whether it is a negative, positive or neutral term.  [name] talked about her anxiety re: leaving, and attributed it to the fact that she has been here "ten and one half days" and is ready to leave.  But she also acknowledged the importance of being able to say no to others and sticking with it, particularly the old friends that she used pills with.  We talked about her strength of her stubbornness, and how that will serve her well.  This was a difficult concept as she sees her stubbornness as a liability.  For instance, others told her not to drink and drive.  She did anyway and got a DUI, spent time in jail and lost her license.  But group members continued to try to help her turn it into an asset by giving her examples of turning it to her advantage.  Daryel Geraldorth, Alix Stowers B 03/11/2014 , 1:52 PM

## 2014-03-12 LAB — VALPROIC ACID LEVEL: VALPROIC ACID LVL: 59.6 ug/mL (ref 50.0–100.0)

## 2014-03-12 MED ORDER — ADULT MULTIVITAMIN W/MINERALS CH: 1.0000 | ORAL_TABLET | Freq: Every day | ORAL | Status: DC

## 2014-03-12 MED ORDER — DIVALPROEX SODIUM ER 250 MG PO TB24: 750.0000 mg | ORAL_TABLET | Freq: Every evening | ORAL | Status: DC

## 2014-03-12 MED ORDER — PRAZOSIN HCL 1 MG PO CAPS: 1.0000 mg | ORAL_CAPSULE | Freq: Every day | ORAL | Status: DC

## 2014-03-12 MED ORDER — SIMVASTATIN 20 MG PO TABS: 20.0000 mg | ORAL_TABLET | Freq: Every day | ORAL | Status: DC

## 2014-03-12 MED ORDER — OLANZAPINE 10 MG PO TBDP: 10.0000 mg | ORAL_TABLET | Freq: Two times a day (BID) | ORAL | Status: DC

## 2014-03-12 MED ORDER — BENZTROPINE MESYLATE 0.5 MG PO TABS: 0.5000 mg | ORAL_TABLET | Freq: Every day | ORAL | Status: DC

## 2014-03-12 MED ORDER — TRAZODONE HCL 50 MG PO TABS: 50.0000 mg | ORAL_TABLET | Freq: Every evening | ORAL | Status: DC | PRN

## 2014-03-12 MED ORDER — GABAPENTIN 100 MG PO CAPS: 100.0000 mg | ORAL_CAPSULE | Freq: Three times a day (TID) | ORAL | Status: DC

## 2014-03-12 MED ORDER — PROPRANOLOL HCL 20 MG PO TABS: 20.0000 mg | ORAL_TABLET | Freq: Two times a day (BID) | ORAL | Status: DC

## 2014-03-12 MED ORDER — MIRTAZAPINE 7.5 MG PO TABS: 7.5000 mg | ORAL_TABLET | Freq: Every day | ORAL | Status: DC

## 2014-03-12 MED ORDER — PANTOPRAZOLE SODIUM 20 MG PO TBEC: 20.0000 mg | DELAYED_RELEASE_TABLET | Freq: Two times a day (BID) | ORAL | Status: DC

## 2014-03-12 NOTE — Tx Team (Signed)
Interdisciplinary Treatment Plan Update (Adult)   Date: 03/12/2014   Time Reviewed: 10:46 AM  Progress in Treatment:  Attending groups: Yes  Participating in groups: Yes  Taking medication as prescribed: Yes  Tolerating medication: Yes  Family/Significant othe contact made: Contact made with pt's mother for SPE and collateral info.  Patient understands diagnosis: Somewhat, pt insight improving.  Discussing patient identified problems/goals with staff: Yes  Medical problems stabilized or resolved: Yes  Denies suicidal/homicidal ideation: Yes  Patient has not harmed self or Others: Yes  New problem(s) identified:  Discharge Plan or Barriers: Pt to return home at d/c and will [facility] [place] for SA IOP, med management, and therapy. Pt refusing inpatient treatment but is open to CSW providing her with inpatient treatment information if she changes her mind after d/c home.  Additional comments:  Becky [name] is a 23 year old white female that was speaking to unseen and unheard individuals. During the assessment, the patient had a blank stare and she was not orientated to person, time place or situation. Patient refused to answer questions during the assessment. Writer received collateral information from her mother that reports that the patient has a history of substance abuse. Her mother reports that she smoked Crystal Meth in the past. Her mother reports that she was [facility] for [facility].  Her mother reports that she has been taking her medication; however, the medication was very strong and made her sleep all the time. Her mother reports that she does not see a therapist. Her mother reports that these behaviors started Saturday. Patient was previously diagnosed with Bipolar Disorder.   10/23: Pt no longer on 1:1 and appears to be less disorganized and is interacting with peers and staff appropriately, with moments of confusion. She is not  actively hallucinating at this time and denies SI/HI. Pt states that she wants to go home and [facility] because "I need to get a job to get money so I can move out on my own."   10/28: Pt demonstrating some paranoia but maintains insight and awareness regarding her paranoid thinking. She denies SI/HI and AVH. Pt stated that she is excited to d/c today. She plans to stay with mother for a few days with the long term  plan of living with her father. Pt looking forward to beginning SA IOP and stated that she is putting her sobriety as "my number one priority." Pt demonstrating clear, oriented, and organized thought process.  Reason for Continuation of Hospitalization: none  Estimated length of stay: d/c today  For review of initial/current patient goals, please see plan of care.  Attendees:  Patient:    Family:    Physician: Dr. Elna BreslowEappen, MD 03/12/2014 10:46 AM   Nursing: Patrecia Pourhrista, Patrice RN 03/12/2014 10:46 AM   Clinical Social Worker Jeanetta Alonzo Smart, LCSWA  03/12/2014 10:46 AM   Other: Daryel Geraldodney North, LCSW 03/12/2014 10:46 AM   Other: Darden DatesJennifer C. Nurse CM  03/12/2014 10:46 AM   Other: Tomasita Morrowelora Sutton, Community Care Coordinator  03/12/2014 10:46 AM   Other: Charleston Ropesandace Hyatt, CSW Intern 03/12/2014 10:46AM   Scribe for Treatment Team:  Herbert SetaHeather Smart LCSWA 03/12/2014 10:46 AM   Pt and CSW reviewed pt's identified goals and treatment plan. Pt verbalized understanding and agreed to treatment plan.  The Sherwin-WilliamsHeather Smart, LCSWA  03/12/2014 10:46 AM

## 2014-03-12 NOTE — Progress Notes (Signed)
[facility] Discharge [facility] :  Will you be returning to the same living situation after discharge: Yes,  home with mother At discharge, do you have transportation home?:Yes,  aunt coming at 1:00PM to transport pt home Do you have the ability to pay for your medications:Yes,  Mental health  Release of information consent forms completed and submitted to Medical Records by CSW. Patient to Follow up at: Follow-up Information   [facility] up with [facility] [place] On 03/14/2014. (Walk in for hospital follow-up between 7:45AM-11:00AM on this date. Referral Number: [id number]. [name] (CPT care coordinator) will call you with SA IOP appt. [phone]))    Contact information:   [address] [place], [place] [address] Phone: [phone] Fax: [phone]      Follow up with [facility]    . Call today. (For follow up of acid reflux and tachycardia. For refills of protonix/inderal. )    Contact information:   [address] [place] [id number] [phone]      Patient denies SI/HI:   Yes,  duirng group/self report.     Safety Planning and Suicide Prevention discussed:  Yes,  SPE completed with pt's mother. SPI pamphlet provided to pt and she was encouraged to share information with support network, ask questions, and talk about any concerns relating to SPE.   Smart, Kitty Cadavid LCSWA  03/12/2014, 10:00 AM
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
## 2014-03-12 NOTE — Discharge Summary (Signed)
Physician Discharge Summary Note  Patient:  Becky [name] is an 23 y.o., female MRN:  [id number] DOB:  05-27-1990 Patient phone:  [phone] (home)  Patient address:   [address] [place] [address],  Total Time spent with patient: 20 minutes  Date of Admission:  03/03/2014 Date of Discharge: 03/12/14  Reason for Admission:  Mood stabilization   Discharge Diagnoses: Active Problems:   Bipolar affective disorder, current episode manic with psychotic symptoms  Psychiatric Specialty Exam: Physical Exam  Psychiatric: She has a normal mood and affect. Her speech is normal and behavior is normal. Judgment and thought content normal. Cognition and memory are normal.    Review of Systems  Constitutional: Negative.   HENT: Negative.   Eyes: Negative.   Respiratory: Negative.   Cardiovascular: Negative.   Gastrointestinal: Negative.   Genitourinary: Negative.   Musculoskeletal: Negative.   Skin: Negative.   Neurological: Negative.   Endo/Heme/Allergies: Negative.   Psychiatric/Behavioral: Positive for hallucinations (Stable with treatment ) and substance abuse (Stable at present ).    Blood pressure 118/70, pulse 89, temperature 98.5 F (36.9 C), temperature source Oral, resp. rate 20, last menstrual period 02/01/2014.There is no weight on file to calculate BMI.  See Physician SRA                                                  Past Psychiatric History: See H&P Diagnosis:  Hospitalizations:  Outpatient Care:  Substance Abuse Care:  Self-Mutilation:  Suicidal Attempts:  Violent Behaviors:   Musculoskeletal: Strength & Muscle Tone: within normal limits Gait & Station: normal Patient leans: N/A  DSM5:  Primary Psychiatric Diagnosis:  Sedative hypnotic and anxiolytic(XANAX) use disorder, severe  Secondary Psychiatric Diagnosis:  Substance (xanax,cannabis,opioid ,methamphetamine) induced psychotic disorder (RESOLVED)  Substance  (xanax,cannabis ,opioids ,methamphetamine) 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 Patient refused to answer questions during the initial  assessment. Patient had a blank stare and has severe thought blocking. Patient was observed yelling for her mother periodically, "Mom ,Mom", when she saw other patients walk by in the hallway, thinking that it is her mother .Patient unable to respond to questions being asked.Patient just responded by saying 'I use xanax, marijuana every day ,40$ worth per day since the past 10 years.  Patient is able to say the time as October 2015 and the is oriented to self ,but not to person ,place.  Patient is a limited historian and hence collateral information was obtained from mother.  Per mother patient went out on Saturday and came back around 2.00 PM with the same presentation. Patient thereafter slept for a while and did not come out of it and hence was brought to the ED. Patient had similar presentation a month ago and that time she had abuse crystal meth. Patient was admitted to [facility] and released after 3 days. Patient was discharged on seroquel ,depakote and  patient slept the whole day for days until she ran out of her medications last week. Per mother she does not think those were the right medications for her daughter since she slept all day and it made her too drowsy. Per mother patient has substance abuse problems and abuses xanax,crystal meth,oipids ,spice ,bathsalts anything that  she can get a hold of.          Becky [name] was admitted to the adult unit where she was evaluated and her symptoms were identified. Medication management was discussed and implemented. Patient was started on several new medications to manage her symptoms. Patient was prescribed Zyprexa 10 mg BID for psychosis, Depakote 750 mg po qhs ,Remeron 7.5 mg hs for insomnia/depression, Minipress 1 mg hs for nightmares, Neurontin 100 mg TID for mood control/anxiety and Ativan taper to address benzo abuse. She was encouraged to participate in unit programming. Medical problems were identified and treated appropriately. Patient was started on Inderal 20 mg BID for tremors and tachycardia. Home medication was restarted as needed.  She was evaluated each day by a clinical provider to ascertain the patient's response to treatment.  Improvement was noted by the patient's report of decreasing symptoms, improved sleep and appetite, affect, medication tolerance, behavior, and participation in unit programming.  The patient was asked each day to complete a self inventory noting mood, mental status, pain, new symptoms, anxiety and concerns.         She responded well to medication and being in a therapeutic and supportive environment. Patient by day before discharge was denying any symptoms of paranoia. She talked about her need to stop abusing drugs. Her family has informed patient they would no longer support her if she continued to use drugs.  Positive and appropriate behavior was noted and the patient was motivated for recovery.  She worked closely with the treatment team and case manager to develop a discharge plan with appropriate goals. Coping skills, problem solving as well as relaxation therapies were also part of the unit programming.         By the day of discharge she was in much improved condition than upon admission.  Symptoms were reported as significantly decreased or resolved completely. The patient denied  SI/HI and voiced no AVH. She was motivated to continue taking medication with a goal of continued improvement in mental health.  Becky [name] was discharged home with a plan to follow up as noted below. Patient left [facility] in stable condition with all belongings returned to her. She was provided with prescriptions and sample medications   Off note : Patient also was tested for bath salts as well as spice ,since her mother was concerned that she may have used it - Pt was negative for both.  Consults:  None  Significant Diagnostic Studies:  Lipid profile, Depakote level, Hemoglobin A1c, TSH,   Discharge Vitals:   Blood pressure 118/70, pulse 89, temperature 98.5 F (36.9 C), temperature source Oral, resp. rate 20, last menstrual period 02/01/2014. There is no weight on file to calculate BMI. Lab Results:   Results for orders placed during the hospital encounter of 03/03/14 (from the past 72 hour(s))  VALPROIC ACID LEVEL     Status: None   Collection Time    03/12/14  6:18 AM      Result Value Ref Range   Valproic Acid Lvl 59.6  50.0 - 100.0 ug/mL   Comment: Performed at [facility]    Physical Findings: AIMS: Facial  and Oral Movements Muscles of Facial Expression: None, normal Lips and Perioral Area: None, normal Jaw: None, normal Tongue: None, normal,Extremity Movements Upper (arms, wrists, hands, fingers): None, normal Lower (legs, knees, ankles, toes): None, normal, Trunk Movements Neck, shoulders, hips: None, normal, Overall Severity Severity of abnormal movements (highest score from questions above): None, normal Incapacitation due to abnormal movements: None, normal Patient's awareness of abnormal movements (rate only patient's report): No Awareness, Dental Status Current problems with teeth and/or dentures?: No Does patient usually wear dentures?: No  CIWA:  CIWA-Ar Total: 1 COWS:     Psychiatric Specialty Exam: See Psychiatric Specialty Exam and Suicide Risk  Assessment completed by Attending Physician prior to discharge.  Discharge destination:  Home  Is patient on multiple antipsychotic therapies at discharge:  No   Has Patient had three or more failed trials of antipsychotic monotherapy by history:  No  Recommended Plan for Multiple Antipsychotic Therapies: NA     Medication List    STOP taking these medications       divalproex 500 MG DR tablet  Commonly known as:  DEPAKOTE  Replaced by:  divalproex 250 MG 24 hr tablet     hydrOXYzine 50 MG capsule  Commonly known as:  VISTARIL     QUEtiapine 100 MG tablet  Commonly known as:  SEROQUEL      TAKE these medications     Indication   benztropine 0.5 MG tablet  Commonly known as:  COGENTIN  Take 1 tablet (0.5 mg total) by mouth at bedtime.   Indication:  Extrapyramidal Reaction caused by Medications     divalproex 250 MG 24 hr tablet  Commonly known as:  DEPAKOTE ER  Take 3 tablets (750 mg total) by mouth every evening.   Indication:  Mood control     gabapentin 100 MG capsule  Commonly known as:  NEURONTIN  Take 1 capsule (100 mg total) by mouth 3 (three) times daily.   Indication:  Anxiety     mirtazapine 7.5 MG tablet  Commonly known as:  REMERON  Take 1 tablet (7.5 mg total) by mouth at bedtime.   Indication:  Trouble Sleeping, Major Depressive Disorder     multivitamin with minerals Tabs tablet  Take 1 tablet by mouth daily. May purchase over the counter to improve general health.   Indication:  Vitamin Supplementation     OLANZapine zydis 10 MG disintegrating tablet  Commonly known as:  ZYPREXA  Take 1 tablet (10 mg total) by mouth 2 (two) times daily.   Indication:  Manic-Depression     pantoprazole 20 MG tablet  Commonly known as:  PROTONIX  Take 1 tablet (20 mg total) by mouth 2 (two) times daily before a meal.   Indication:  Gastroesophageal Reflux Disease     prazosin 1 MG capsule  Commonly known as:  MINIPRESS  Take 1 capsule (1 mg total) by  mouth at bedtime.   Indication:  Nightmares     propranolol 20 MG tablet  Commonly known as:  INDERAL  Take 1 tablet (20 mg total) by mouth 2 (two) times daily.   Indication:  Tremors, Tachycardia     simvastatin 20 MG tablet  Commonly known as:  ZOCOR  Take 1 tablet (20 mg total) by mouth daily at 6 PM.   Indication:  Inherited Heterozygous Hypercholesterolemia     traZODone 50 MG tablet  Commonly known as:  DESYREL  Take 1 tablet (50 mg total) by mouth at bedtime as needed  and may repeat dose one time if needed for sleep (For use with no sleep from the use of scheduled Remeron and Minipress).   Indication:  Trouble Sleeping           Follow-up Information   [facility] with [facility] [place] On 03/14/2014. (Walk in for hospital follow-up between 7:45AM-11:00AM on this date. Referral Number: [id number])    Contact information:   [address] [place], [place] [address] Phone: [phone] Fax: [phone]      Follow up with [facility].      Follow up with [facility]    . Call today. (For follow up of acid reflux and tachycardia. For refills of protonix/inderal. )    Contact information:   [address] [place] [address] [phone]      Follow-up recommendations:    As directed per discharge instructions.   Comments:   Take all your medications as prescribed by your mental healthcare provider.  Report any adverse effects and or reactions from your medicines to your outpatient provider promptly.  Patient is instructed and cautioned to not engage in alcohol and or illegal drug use while on prescription medicines.  In the event of worsening symptoms, patient is instructed to call the crisis hotline, 911 and or go to the nearest ED for appropriate evaluation and treatment of symptoms.  Follow-up with your primary care provider for your other medical issues, concerns and or health care needs.   Total  Discharge Time:  Greater than 30 minutes.  SignedFransisca Kaufmann: DAVIS, LAURA NP-C 03/12/2014, 9:53 AM  Patient was seen face to face for psychiatric evaluation, suicide risk assessment and case discussed with treatment team and NP and made appropriate disposition plans. Reviewed the information documented and agree with the treatment plan except where it is noted. Jomarie LongsSaramma Soledad Budreau ,MD Attending Psychiatrist  [facility]

## 2014-03-12 NOTE — [facility] Group Notes (Signed)
[facility]   03/12/2014 10:30 AM  Participation Quality:  Appropriate   Mood/Affect:  Appropriate  Depression Rating:  0  Anxiety Rating:  5-"Im anxious to leave and see my family."   Thoughts of Suicide:  No Will you contract for safety?   NA  Current AVH:  No  Plan for Discharge/Comments:  Pt reports that she feels ready to d/c today. [facility]. [name] (CPT care Production designer, theatre/television/filmmanager) called CSW stating that she would contact pt directly with [facility] appt. Pt made aware. No other needs or concerns verbalized by pt.   Transportation Means: aunt   Supports: mom, dad, aunt, boyfriend   Counselling psychologistmart, Lebron QuamHeather LCSWA

## 2014-03-12 NOTE — Plan of Care (Signed)
Problem: Alteration in thought process Goal: LTG-Patient has not harmed self or others in at least 2 days Outcome: Completed/Met Date Met:  03/12/14 Pt has remained safe and has not engaged in aggressive behaviors.

## 2014-03-12 NOTE — Progress Notes (Signed)
Discharge note: Pt received both written and verbal discharge instructions. Pt verbalized understanding of discharge instructions. Pt agreed to f/u appt and med regimen. Pt received sample meds and prescriptions. Pt had no belongings in locker but retrieved belongings from room. Pt denies SI/HI/AVH. Pt safely left [facility].

## 2014-03-12 NOTE — Progress Notes (Signed)
Pt reports she had a good day today.  She tells this Clinical research associatewriter that she is supposed to discharge home tomorrow.  She denies SI/HI/AV at this time.  She intends to stay off of drugs and [facility].  Pt still seems to be a little paranoid, but says she is comfortable with Clinical research associatewriter.  Pt was encouraged to make her needs known to staff.  Pt voiced understanding.  Pt has been pleasant/cooperative with staff this evening.  Support and encouragement offered.  Safety maintained with q15 minute checks.

## 2014-03-12 NOTE — Plan of Care (Signed)
Problem: Alteration in thought process Goal: STG-Patient does not respond to command hallucinations Outcome: Progressing Pt denies auditory hallucinations to writer this evening, and displays no signs of response to internal stimuli.

## 2014-03-13 LAB — MISCELLANEOUS TEST

## 2014-03-17 ENCOUNTER — Encounter (HOSPITAL_COMMUNITY): Payer: Self-pay | Admitting: Psychiatry

## 2014-03-17 NOTE — Progress Notes (Signed)
Patient Discharge Instructions:  After Visit Summary (AVS):   Faxed to:  03/17/14 Discharge Summary Note:   Faxed to:  03/17/14 Psychiatric Admission Assessment Note:   Faxed to:  03/17/14 Suicide Risk Assessment - Discharge Assessment:   Faxed to:  03/17/14 Faxed/Sent to the Next Level Care provider:  03/17/14 Next Level Care Provider Has Access to the EMR, 03/17/14  [facility] to [facility] @ [phone] [facility] via CHL/Epic access.  Jerelene ReddenSheena E Doerun, 03/17/2014, 2:40 PM

## 2015-06-16 ENCOUNTER — Observation Stay (HOSPITAL_COMMUNITY)
Admission: EM | Admit: 2015-06-16 | Discharge: 2015-06-17 | Disposition: A | Discharge status: Home / Self Care | Payer: Medicaid Other | Attending: Internal Medicine | Admitting: Internal Medicine

## 2015-06-16 ENCOUNTER — Encounter (HOSPITAL_COMMUNITY): Payer: Self-pay | Admitting: Emergency Medicine

## 2015-06-16 ENCOUNTER — Emergency Department (HOSPITAL_COMMUNITY): Payer: Medicaid Other

## 2015-06-16 DIAGNOSIS — J40 Bronchitis, not specified as acute or chronic: Secondary | ICD-10-CM | POA: Insufficient documentation

## 2015-06-16 DIAGNOSIS — R1013 Epigastric pain: Secondary | ICD-10-CM | POA: Diagnosis present

## 2015-06-16 DIAGNOSIS — R740 Nonspecific elevation of levels of transaminase and lactic acid dehydrogenase [LDH]: Secondary | ICD-10-CM | POA: Insufficient documentation

## 2015-06-16 DIAGNOSIS — Z79899 Other long term (current) drug therapy: Secondary | ICD-10-CM | POA: Insufficient documentation

## 2015-06-16 DIAGNOSIS — Z8711 Personal history of peptic ulcer disease: Secondary | ICD-10-CM | POA: Diagnosis not present

## 2015-06-16 DIAGNOSIS — R768 Other specified abnormal immunological findings in serum: Secondary | ICD-10-CM | POA: Diagnosis present

## 2015-06-16 DIAGNOSIS — K279 Peptic ulcer, site unspecified, unspecified as acute or chronic, without hemorrhage or perforation: Secondary | ICD-10-CM | POA: Diagnosis present

## 2015-06-16 DIAGNOSIS — F17210 Nicotine dependence, cigarettes, uncomplicated: Secondary | ICD-10-CM | POA: Insufficient documentation

## 2015-06-16 DIAGNOSIS — F1911 Other psychoactive substance abuse, in remission: Secondary | ICD-10-CM

## 2015-06-16 DIAGNOSIS — F319 Bipolar disorder, unspecified: Secondary | ICD-10-CM | POA: Insufficient documentation

## 2015-06-16 DIAGNOSIS — R7401 Elevation of levels of liver transaminase levels: Secondary | ICD-10-CM | POA: Diagnosis present

## 2015-06-16 HISTORY — DX: Other psychoactive substance abuse, in remission: F19.11

## 2015-06-16 HISTORY — DX: Other specified abnormal immunological findings in serum: R76.8

## 2015-06-16 LAB — RAPID URINE DRUG SCREEN, HOSP PERFORMED
Amphetamines: NOT DETECTED
BARBITURATES: NOT DETECTED
Benzodiazepines: NOT DETECTED
COCAINE: NOT DETECTED
Opiates: NOT DETECTED
TETRAHYDROCANNABINOL: NOT DETECTED

## 2015-06-16 LAB — COMPREHENSIVE METABOLIC PANEL
ALBUMIN: 4.1 g/dL (ref 3.5–5.0)
ALT: 413 U/L — AB (ref 14–54)
AST: 109 U/L — AB (ref 15–41)
Alkaline Phosphatase: 119 U/L (ref 38–126)
Anion gap: 10 (ref 5–15)
BUN: 9 mg/dL (ref 6–20)
CHLORIDE: 105 mmol/L (ref 101–111)
CO2: 26 mmol/L (ref 22–32)
CREATININE: 0.78 mg/dL (ref 0.44–1.00)
Calcium: 8.8 mg/dL — ABNORMAL LOW (ref 8.9–10.3)
GFR calc Af Amer: >60 mL/min (ref >60)
GLUCOSE: 113 mg/dL — AB (ref 65–99)
POTASSIUM: 3.7 mmol/L (ref 3.5–5.1)
Sodium: 141 mmol/L (ref 135–145)
Total Bilirubin: 0.7 mg/dL (ref 0.3–1.2)
Total Protein: 7 g/dL (ref 6.5–8.1)

## 2015-06-16 LAB — CBC
HCT: 39.8 % (ref 36.0–46.0)
Hemoglobin: 13.2 g/dL (ref 12.0–15.0)
MCH: 29.1 pg (ref 26.0–34.0)
MCHC: 33.2 g/dL (ref 30.0–36.0)
MCV: 87.9 fL (ref 78.0–100.0)
Platelets: 248 10*3/uL (ref 150–400)
RBC: 4.53 MIL/uL (ref 3.87–5.11)
RDW: 13.8 % (ref 11.5–15.5)
WBC: 6.4 10*3/uL (ref 4.0–10.5)

## 2015-06-16 LAB — ACETAMINOPHEN LEVEL

## 2015-06-16 LAB — PROTIME-INR
INR: 1.03 (ref 0.00–1.49)
PROTHROMBIN TIME: 13.7 s (ref 11.6–15.2)

## 2015-06-16 LAB — ETHANOL: Alcohol, Ethyl (B): <5 mg/dL (ref <5)

## 2015-06-16 LAB — SALICYLATE LEVEL

## 2015-06-16 LAB — APTT: APTT: 26 s (ref 24–37)

## 2015-06-16 LAB — I-STAT BETA HCG BLOOD, ED (MC, WL, AP ONLY)

## 2015-06-16 LAB — I-STAT TROPONIN, ED: Troponin i, poc: 0 ng/mL (ref 0.00–0.08)

## 2015-06-16 LAB — LIPASE, BLOOD: Lipase: 51 U/L (ref 11–51)

## 2015-06-16 MED ORDER — QUETIAPINE FUMARATE 100 MG PO TABS
100.0000 mg | ORAL_TABLET | Freq: Two times a day (BID) | ORAL | Status: DC
  Administered 2015-06-16 – 2015-06-17 (×2): 100 mg via ORAL
  Filled 2015-06-16 (×2): qty 1

## 2015-06-16 MED ORDER — ACETYLCYSTEINE 20 % IN SOLN
140.0000 mg/kg | Freq: Once | RESPIRATORY_TRACT | Status: AC
  Administered 2015-06-16: 10480 mg via ORAL
  Filled 2015-06-16: qty 60

## 2015-06-16 MED ORDER — SODIUM CHLORIDE 0.9 % IV BOLUS (SEPSIS)
1000.0000 mL | Freq: Once | INTRAVENOUS | Status: AC
  Administered 2015-06-16: 1000 mL via INTRAVENOUS

## 2015-06-16 MED ORDER — PANTOPRAZOLE SODIUM 40 MG PO TBEC
40.0000 mg | DELAYED_RELEASE_TABLET | Freq: Every day | ORAL | Status: DC
  Administered 2015-06-16 – 2015-06-17 (×2): 40 mg via ORAL
  Filled 2015-06-16 (×2): qty 1

## 2015-06-16 MED ORDER — TRAMADOL HCL 50 MG PO TABS
50.0000 mg | ORAL_TABLET | Freq: Four times a day (QID) | ORAL | Status: DC | PRN
  Administered 2015-06-16 – 2015-06-17 (×2): 50 mg via ORAL
  Filled 2015-06-16 (×2): qty 1

## 2015-06-16 MED ORDER — MIRTAZAPINE 15 MG PO TABS
15.0000 mg | ORAL_TABLET | Freq: Every day | ORAL | Status: DC
  Administered 2015-06-16: 15 mg via ORAL
  Filled 2015-06-16: qty 1

## 2015-06-16 MED ORDER — GABAPENTIN 600 MG PO TABS: 600.0000 mg | ORAL_TABLET | Freq: Three times a day (TID) | ORAL | Status: DC

## 2015-06-16 MED ORDER — ONDANSETRON HCL 4 MG PO TABS: 4.0000 mg | ORAL_TABLET | Freq: Four times a day (QID) | ORAL | Status: DC | PRN

## 2015-06-16 MED ORDER — GABAPENTIN 300 MG PO CAPS
600.0000 mg | ORAL_CAPSULE | Freq: Three times a day (TID) | ORAL | Status: DC
  Administered 2015-06-16 – 2015-06-17 (×3): 600 mg via ORAL
  Filled 2015-06-16 (×3): qty 2

## 2015-06-16 MED ORDER — SUCRALFATE 1 G PO TABS
1.0000 g | ORAL_TABLET | Freq: Once | ORAL | Status: AC
  Administered 2015-06-16: 1 g via ORAL
  Filled 2015-06-16: qty 1

## 2015-06-16 MED ORDER — SODIUM CHLORIDE 0.9 % IV SOLN
INTRAVENOUS | Status: DC
  Administered 2015-06-16 – 2015-06-17 (×2): via INTRAVENOUS

## 2015-06-16 MED ORDER — ESCITALOPRAM OXALATE 10 MG PO TABS
20.0000 mg | ORAL_TABLET | Freq: Every day | ORAL | Status: DC
  Administered 2015-06-17: 20 mg via ORAL
  Filled 2015-06-16: qty 2

## 2015-06-16 MED ORDER — FAMOTIDINE IN NACL 20-0.9 MG/50ML-% IV SOLN
20.0000 mg | INTRAVENOUS | Status: AC
  Administered 2015-06-16: 20 mg via INTRAVENOUS
  Filled 2015-06-16: qty 50

## 2015-06-16 MED ORDER — ONDANSETRON HCL 4 MG/2ML IJ SOLN: 4.0000 mg | Freq: Four times a day (QID) | INTRAMUSCULAR | Status: DC | PRN

## 2015-06-16 MED ORDER — ACETYLCYSTEINE 20 % IN SOLN
70.0000 mg/kg | RESPIRATORY_TRACT | Status: DC
  Administered 2015-06-16 (×2): 5240 mg via ORAL
  Filled 2015-06-16 (×20): qty 30

## 2015-06-16 MED ORDER — ONDANSETRON HCL 4 MG/2ML IJ SOLN
4.0000 mg | INTRAMUSCULAR | Status: AC
  Administered 2015-06-16: 4 mg via INTRAVENOUS
  Filled 2015-06-16: qty 2

## 2015-06-16 MED ORDER — ENOXAPARIN SODIUM 40 MG/0.4ML ~~LOC~~ SOLN
40.0000 mg | SUBCUTANEOUS | Status: DC
Start: 2015-06-16 — End: 2015-06-17
  Administered 2015-06-16: 40 mg via SUBCUTANEOUS
  Filled 2015-06-16: qty 0.4

## 2015-06-16 MED ORDER — SENNOSIDES-DOCUSATE SODIUM 8.6-50 MG PO TABS: 1.0000 | ORAL_TABLET | Freq: Every evening | ORAL | Status: DC | PRN

## 2015-06-16 NOTE — ED Notes (Signed)
Pt reports epigastric pain for over 1 month and has been taking protonix.  Reports pain worse since yesterday.  Also reports had a baby 4 months ago and started her period yesterday.  Reports heavy bleeding and lower abd cramping.  Also reports diarrhea x 4 days.  Last episode was today.

## 2015-06-16 NOTE — H&P (Signed)
[facility]          History and Physical    PCP:   No PCP Per Patient   EDP: Quincy Carnes, PA  Chief Complaint:  Epigastric abdominal pain  HPI: Patient is a 25 year old young lady with history of substance abuse including IV heroin in the past, bipolar disorder who presents to the hospital today with epigastric abdominal pain that has been present for a few months but became acutely worse today. She has a history of peptic ulcers and thought that this might be the etiology. In addition to this she also has some lower abdominal pain which she thought was cramps due to her menstrual cycle and she has been doing significant amounts of ibuprofen and Tylenol at home although she is unable to quantify how much. Upon arrival to the ED she was noted to have significant transaminitis, and abdominal ultrasound shows no acute abnormalities. She thinks she might have been told at some point that she has hepatitis C although she has never had any follow-up or treatment for this. Tylenol level is normal, however after discussion that EDP had with poison control, they're recommending overnight observation to treat with Mucomyst and recheck Tylenol levels in a.m.  Allergies:   Allergies  Allergen Reactions  . Nsaids Other (See Comments)    Peptic ulcers   . Tylenol [Acetaminophen] Itching and Rash      Past Medical History  Diagnosis Date  . Bronchitis   . Peptic ulcer   . Bipolar 1 disorder [facility])     Past Surgical History  Procedure Laterality Date  . Other surgical history      scar tissue removed from right ovary   . Other surgical history      fallopian tube repair  . Wisdom tooth extraction      Prior to Admission medications   Medication Sig Start Date End Date Taking? Authorizing Provider  escitalopram (LEXAPRO) 20 MG tablet Take 20 mg by mouth daily.   Yes Historical Provider, MD  gabapentin (NEURONTIN) 600 MG tablet Take 600 mg by mouth 3 (three)  times daily.   Yes Historical Provider, MD  Ibuprofen (MIDOL PO) Take 2 tablets by mouth daily as needed (pain).   Yes Historical Provider, MD  mirtazapine (REMERON) 15 MG tablet Take 15 mg by mouth at bedtime.   Yes Historical Provider, MD  pantoprazole (PROTONIX) 20 MG tablet Take 1 tablet (20 mg total) by mouth 2 (two) times daily before a meal. Patient taking differently: Take 20 mg by mouth daily.  03/12/14  Yes Niel Hummer, NP  QUEtiapine (SEROQUEL) 100 MG tablet Take 100 mg by mouth 2 (two) times daily.   Yes Historical Provider, MD    Social History:  reports that she has been smoking Cigarettes.  She has a 10 pack-year smoking history. She has never used smokeless tobacco. She reports that she drinks alcohol. She reports that she uses illicit drugs (Marijuana, Methamphetamines, MDMA (Ecstacy), Oxycodone, and Benzodiazepines).  Family History  Problem Relation Age of Onset  . Cancer Other   . Drug abuse Brother     Review of Systems:  Constitutional: Denies fever, chills, diaphoresis, appetite change and fatigue.  HEENT: Denies photophobia, eye pain, redness, hearing loss, ear pain, congestion, sore throat, rhinorrhea, sneezing, mouth sores, trouble swallowing, neck pain, neck stiffness and tinnitus.   Respiratory: Denies SOB, DOE, cough, chest tightness,  and wheezing.  Cardiovascular: Denies chest pain, palpitations and leg swelling.  Gastrointestinal: Denies nausea, vomiting, abdominal pain, diarrhea, constipation, blood in stool and abdominal distention.  Genitourinary: Denies dysuria, urgency, frequency, hematuria, flank pain and difficulty urinating.  Endocrine: Denies: hot or cold intolerance, sweats, changes in hair or nails, polyuria, polydipsia. Musculoskeletal: Denies myalgias, back pain, joint swelling, arthralgias and gait problem.  Skin: Denies pallor, rash and wound.  Neurological: Denies dizziness, seizures, syncope, weakness, light-headedness, numbness and  headaches.  Hematological: Denies adenopathy. Easy bruising, personal or family bleeding history  Psychiatric/Behavioral: Denies suicidal ideation, mood changes, confusion, nervousness, sleep disturbance and agitation   Physical Exam: Blood pressure 128/91, pulse 98, temperature 98.1 F (36.7 C), temperature source Oral, resp. rate 16, height '5\' 2"'  (1.575 m), weight 74.844 kg (165 lb), last menstrual period 06/15/2015, SpO2 99 %. General: Alert, awake, oriented 3+ HEENT: Normocephalic, atraumatic, pupils carotid reactive to light, extraocular movements intact Neck: Supple, no JVD, no lymphadenopathy, no bruits, no goiter Cardiovascular: Regular rate and rhythm, no murmurs, rubs or gallops Lungs: Clear to auscultation bilaterally Abdomen: Soft, slightly tender to palpation to the epigastric right upper quadrant, positive bowel sounds Extremities: No clubbing, cyanosis or edema, positive pulses Neurologic: Grossly intact and nonfocal  Labs on Admission:  Results for orders placed or performed during the hospital encounter of 06/16/15 (from the past 48 hour(s))  Lipase, blood     Status: None   Collection Time: 06/16/15  9:18 AM  Result Value Ref Range   Lipase 51 11 - 51 U/L  Comprehensive metabolic panel     Status: Abnormal   Collection Time: 06/16/15  9:18 AM  Result Value Ref Range   Sodium 141 135 - 145 mmol/L   Potassium 3.7 3.5 - 5.1 mmol/L   Chloride 105 101 - 111 mmol/L   CO2 26 22 - 32 mmol/L   Glucose, Bld 113 (H) 65 - 99 mg/dL   BUN 9 6 - 20 mg/dL   Creatinine, Ser 0.78 0.44 - 1.00 mg/dL   Calcium 8.8 (L) 8.9 - 10.3 mg/dL   Total Protein 7.0 6.5 - 8.1 g/dL   Albumin 4.1 3.5 - 5.0 g/dL   AST 109 (H) 15 - 41 U/L   ALT 413 (H) 14 - 54 U/L   Alkaline Phosphatase 119 38 - 126 U/L   Total Bilirubin 0.7 0.3 - 1.2 mg/dL   GFR calc non Af Amer >60 >60 mL/min   GFR calc Af Amer >60 >60 mL/min    Comment: (NOTE) The eGFR has been calculated using the CKD EPI equation. This  calculation has not been validated in all clinical situations. eGFR's persistently <60 mL/min signify possible Chronic Kidney Disease.    Anion gap 10 5 - 15  CBC     Status: None   Collection Time: 06/16/15  9:18 AM  Result Value Ref Range   WBC 6.4 4.0 - 10.5 K/uL   RBC 4.53 3.87 - 5.11 MIL/uL   Hemoglobin 13.2 12.0 - 15.0 g/dL   HCT 39.8 36.0 - 46.0 %   MCV 87.9 78.0 - 100.0 fL   MCH 29.1 26.0 - 34.0 pg   MCHC 33.2 30.0 - 36.0 g/dL   RDW 13.8 11.5 - 15.5 %   Platelets 248 150 - 400 K/uL  Acetaminophen level     Status: Abnormal   Collection Time: 06/16/15  9:36 AM  Result Value Ref Range   Acetaminophen (Tylenol), Serum <10 (L) 10 - 30 ug/mL    Comment:  THERAPEUTIC CONCENTRATIONS VARY SIGNIFICANTLY. A RANGE OF 10-30 ug/mL MAY BE AN EFFECTIVE CONCENTRATION FOR MANY PATIENTS. HOWEVER, SOME ARE BEST TREATED AT CONCENTRATIONS OUTSIDE THIS RANGE. ACETAMINOPHEN CONCENTRATIONS >150 ug/mL AT 4 HOURS AFTER INGESTION AND >50 ug/mL AT 12 HOURS AFTER INGESTION ARE OFTEN ASSOCIATED WITH TOXIC REACTIONS.   Protime-INR     Status: None   Collection Time: 06/16/15  9:36 AM  Result Value Ref Range   Prothrombin Time 13.7 11.6 - 15.2 seconds   INR 1.03 0.00 - 1.49  APTT     Status: None   Collection Time: 06/16/15  9:36 AM  Result Value Ref Range   aPTT 26 24 - 37 seconds  Salicylate level     Status: None   Collection Time: 06/16/15  9:36 AM  Result Value Ref Range   Salicylate Lvl <0.4 2.8 - 30.0 mg/dL  Ethanol     Status: None   Collection Time: 06/16/15  9:36 AM  Result Value Ref Range   Alcohol, Ethyl (B) <5 <5 mg/dL    Comment:        LOWEST DETECTABLE LIMIT FOR SERUM ALCOHOL IS 5 mg/dL FOR MEDICAL PURPOSES ONLY   I-stat troponin, ED     Status: None   Collection Time: 06/16/15  9:37 AM  Result Value Ref Range   Troponin i, poc 0.00 0.00 - 0.08 ng/mL   Comment 3            Comment: Due to the release kinetics of cTnI, a negative result within the first  hours of the onset of symptoms does not rule out myocardial infarction with certainty. If myocardial infarction is still suspected, repeat the test at appropriate intervals.   I-Stat Beta hCG blood, ED ([facility], [facility], [facility] only)     Status: None   Collection Time: 06/16/15  9:37 AM  Result Value Ref Range   I-stat hCG, quantitative <5.0 <5 mIU/mL   Comment 3            Comment:   GEST. AGE      CONC.  (mIU/mL)   <=1 WEEK        5 - 50     2 WEEKS       50 - 500     3 WEEKS       100 - 10,000     4 WEEKS     1,000 - 30,000        FEMALE AND NON-PREGNANT FEMALE:     LESS THAN 5 mIU/mL   Urine rapid drug screen (hosp performed)     Status: None   Collection Time: 06/16/15 10:29 AM  Result Value Ref Range   Opiates NONE DETECTED NONE DETECTED   Cocaine NONE DETECTED NONE DETECTED   Benzodiazepines NONE DETECTED NONE DETECTED   Amphetamines NONE DETECTED NONE DETECTED   Tetrahydrocannabinol NONE DETECTED NONE DETECTED   Barbiturates NONE DETECTED NONE DETECTED    Comment:        DRUG SCREEN FOR MEDICAL PURPOSES ONLY.  IF CONFIRMATION IS NEEDED FOR ANY PURPOSE, NOTIFY LAB WITHIN 5 DAYS.        LOWEST DETECTABLE LIMITS FOR URINE DRUG SCREEN Drug Class       Cutoff (ng/mL) Amphetamine      1000 Barbiturate      200 Benzodiazepine   599 Tricyclics       774 Opiates          300 Cocaine  300 THC              50     Radiological Exams on Admission: US Abdomen Complete  06/16/2015  CLINICAL DATA:  Hepatitis-C, elevated LFTs, worsening abdominal pain, smoker EXAM: ABDOMEN ULTRASOUND COMPLETE COMPARISON:  CT abdomen and pelvis 09/04/2013 FINDINGS: Gallbladder: Normal distend without stones or wall thickening. No pericholecystic fluid or sonographic Murphy sign. Common bile duct: Diameter: Normal caliber 4 mm diameter Liver: Normal appearance IVC: Normal appearance Pancreas: Tail obscured by bowel gas. Visualized portion normal appearance. Spleen: Normal appearance, 9.5 cm length  Right Kidney: Length: 11.7 cm. Normal morphology without mass or hydronephrosis. Left Kidney: Length: 11.8 cm. Normal morphology without mass or hydronephrosis. Abdominal aorta: Normal caliber Other findings: No free fluid IMPRESSION: Incomplete visualization of pancreatic tail. Otherwise normal exam. Electronically Signed   By: Lavonia Dana M.D.   On: 06/16/2015 11:45    Assessment/Plan Active Problems:   Bipolar affective disorder, current episode manic with psychotic symptoms (HCC)   Transaminitis   Substance abuse   Transaminitis -At this point most likely etiology appears to be her history of hepatitis C, also cannot rule out effects of Tylenol toxicity at this time. -Agree with admission overnight for observation, to continue Mucomyst and recheck Tylenol levels in a.m. -We'll check LFTs in a.m., will check acute hepatitis panel. -May consider GI consult in a.m. if LFTs worsened, if not can follow-up with them in the office.  Substance abuse -Cessation counseling provided. -Need to be judicious with narcotic use. Cannot use Tylenol because of transaminitis and cannot use ibuprofen due to her history of peptic ulcer disease, will elect to treat for now with tramadol.  DVT prophylaxis -Lovenox  CODE STATUS -Full  code   Time Spent on Admission: 75 minutes  [facility] Pager: [phone] 06/16/2015, 3:40 PM

## 2015-06-16 NOTE — ED Notes (Signed)
PT c/o epigastric abdominal pain with some diarrhea x4 days but denies any nausea or vomiting and states was started on protonix about a month ago for acid reflux. PT also states lower abdominal pressure and pain starting yesterday and states yesterday was the first day of her period and first return of menses since birth of 77 month old son.

## 2015-06-16 NOTE — ED Provider Notes (Signed)
CSN: [id number]     Arrival date & time 06/16/15  4098 History   First MD Initiated Contact with Patient 06/16/15 [phone]     Chief Complaint  Patient presents with  . Abdominal Pain     (Consider location/radiation/quality/duration/timing/severity/associated sxs/prior Treatment) The history is provided by the patient and medical records.     25 y.o. F with hx of bronchitis, PUD, bipolar disorder, presenting to the ED for epigastric pain for the past month, worse over the past 4 days.  Patient states pain is localized to her substernal/epigastric region, described as sharp in nature.  She denies chest pain or SOB.  No nausea or vomiting. She did have a few episodes of watery diarrhea. No melena or hematochezia.  States she has been taking her protonix as prescribed without relief.  Patient also has some lower abdominal cramping due to onset of her first menses since giving birth 4 days ago.  States bleeding is slightly heavier than normal, no passage of clots.  No fever, chills, sweats.  Past Medical History  Diagnosis Date  . Bronchitis   . Peptic ulcer   . Bipolar 1 disorder [facility])    Past Surgical History  Procedure Laterality Date  . Other surgical history      scar tissue removed from right ovary   . Other surgical history      fallopian tube repair  . Wisdom tooth extraction     Family History  Problem Relation Age of Onset  . Cancer Other   . Drug abuse Brother    Social History  Substance Use Topics  . Smoking status: Current Every Day Smoker -- 1.00 packs/day for 10 years    Types: Cigarettes  . Smokeless tobacco: Never Used  . Alcohol Use: Yes     Comment: occ   OB History    Gravida Para Term Preterm AB TAB SAB Ectopic Multiple Living   Review of Systems  Gastrointestinal: Positive for abdominal pain.  All other systems reviewed and are negative.     Allergies  Nsaids and Tylenol  Home Medications   Prior to Admission medications     Medication Sig Start Date End Date Taking? Authorizing Provider  escitalopram (LEXAPRO) 20 MG tablet Take 20 mg by mouth daily.   Yes Historical Provider, MD  gabapentin (NEURONTIN) 600 MG tablet Take 600 mg by mouth 3 (three) times daily.   Yes Historical Provider, MD  Ibuprofen (MIDOL PO) Take 2 tablets by mouth daily as needed (pain).   Yes Historical Provider, MD  mirtazapine (REMERON) 15 MG tablet Take 15 mg by mouth at bedtime.   Yes Historical Provider, MD  pantoprazole (PROTONIX) 20 MG tablet Take 1 tablet (20 mg total) by mouth 2 (two) times daily before a meal. Patient taking differently: Take 20 mg by mouth daily.  03/12/14  Yes Thermon Leyland, NP  QUEtiapine (SEROQUEL) 100 MG tablet Take 100 mg by mouth 2 (two) times daily.   Yes Historical Provider, MD   BP 139/88 mmHg  Pulse 116  Temp(Src) 98.1 F (36.7 C) (Oral)  Resp 18  Ht  (1.575 m)  Wt 74.844 kg  BMI 30.17 kg/m2  SpO2 100%  LMP 06/15/2015   Physical Exam  Constitutional: She is oriented to person, place, and time. She appears well-developed and well-nourished.  HENT:  Head: Normocephalic and atraumatic.  Mouth/Throat: Oropharynx is clear and moist.  Eyes: Conjunctivae and EOM are normal. Pupils are equal, round, and reactive to light.  Pupils dilated but reactive bilaterally  Neck: Normal range of motion.  Cardiovascular: Normal rate, regular rhythm and normal heart sounds.   Pulmonary/Chest: Effort normal and breath sounds normal.  Abdominal: Soft. Bowel sounds are normal. There is tenderness in the epigastric area. There is no tenderness at McBurney's point and negative Murphy's sign.    Musculoskeletal: Normal range of motion.  Neurological: She is alert and oriented to person, place, and time.  Skin: Skin is warm and dry.  Psychiatric: She has a normal mood and affect.  Nursing note and vitals reviewed.   ED Course  Procedures (including critical care time)  CRITICAL CARE Performed by: Garlon Hatchet   Total critical care time: 45 minutes  Critical care time was exclusive of separately billable procedures and treating other patients.  Critical care was necessary to treat or prevent imminent or life-threatening deterioration.  Critical care was time spent personally by me on the following activities: development of treatment plan with patient and/or surrogate as well as nursing, discussions with consultants, evaluation of patient's response to treatment, examination of patient, obtaining history from patient or surrogate, ordering and performing treatments and interventions, ordering and review of laboratory studies, ordering and review of radiographic studies, pulse oximetry and re-evaluation of patient's condition.  Medications  acetylcysteine (MUCOMYST) 20 % nebulizer / oral solution 10,480 mg (not administered)    Followed by  acetylcysteine (MUCOMYST) 20 % nebulizer / oral solution 5,240 mg (not administered)  famotidine (PEPCID) IVPB 20 mg premix (0 mg Intravenous Stopped 06/16/15 1015)  ondansetron (ZOFRAN) injection 4 mg (4 mg Intravenous Given 06/16/15 0942)  sodium chloride 0.9 % bolus 1,000 mL (0 mLs Intravenous Stopped 06/16/15 1051)  sucralfate (CARAFATE) tablet 1 g (1 g Oral Given 06/16/15 0942)    Labs Review Labs Reviewed  COMPREHENSIVE METABOLIC PANEL - Abnormal; Notable for the following:    Glucose, Bld 113 (*)    Calcium 8.8 (*)    AST 109 (*)    ALT 413 (*)    All other components within normal limits  ACETAMINOPHEN LEVEL - Abnormal; Notable for the following:    Acetaminophen (Tylenol), Serum <10 (*)    All other components within normal limits  LIPASE, BLOOD  CBC  PROTIME-INR  APTT  SALICYLATE LEVEL  ETHANOL  URINE RAPID DRUG SCREEN, HOSP PERFORMED  HEPATITIS PANEL, ACUTE  I-STAT TROPOININ, ED  I-STAT BETA HCG BLOOD, ED ([facility], [facility], [facility] ONLY)    Imaging Review US Abdomen Complete  06/16/2015  CLINICAL DATA:  Hepatitis-C, elevated LFTs, worsening  abdominal pain, smoker EXAM: ABDOMEN ULTRASOUND COMPLETE COMPARISON:  CT abdomen and pelvis 09/04/2013 FINDINGS: Gallbladder: Normal distend without stones or wall thickening. No pericholecystic fluid or sonographic Murphy sign. Common bile duct: Diameter: Normal caliber 4 mm diameter Liver: Normal appearance IVC: Normal appearance Pancreas: Tail obscured by bowel gas. Visualized portion normal appearance. Spleen: Normal appearance, 9.5 cm length Right Kidney: Length: 11.7 cm. Normal morphology without mass or hydronephrosis. Left Kidney: Length: 11.8 cm. Normal morphology without mass or hydronephrosis. Abdominal aorta: Normal caliber Other findings: No free fluid IMPRESSION: Incomplete visualization of pancreatic tail. Otherwise normal exam. Electronically Signed   By: Ulyses Southward M.D.   On: 06/16/2015 11:45   I have personally reviewed and evaluated these images and lab results as part of my medical decision-making.   EKG Interpretation None      MDM  Final diagnoses:  Transaminitis  Epigastric pain   25 year old female here with epigastric pain for the past month, worse over the past several days, unrelieved by her Protonix. Patient is also had onset of her menses after childbirth which is causing some lower abdominal cramping. Patient is afebrile, nontoxic. She does have some tenderness in her epigastric, substernal region. No rebound or guarding. Labs pending. Will give IV Pepcid, Zofran, and Carafate as well as fluid bolus.  10:15 AM Patient's labs do reveal a new transaminitis.  On repeat discussion patient now reveals to me that she has Hep C which she states contracted several years ago while injecting heroin.  Thinks she was diagnosed at [facility] but has never had any follow-up for this. She states her liver enzymes have been elevated in the past but does not remember exactly what the numbers were.  She also admits that she has been taking increased doses of tylenol and NSAIDs  due to the pain from her menses.  States she takes 3-4 tabs at a time multiple times a day.  She thinks the tabs are regular strength but is not sure.  Last dose was yesterday evening.  She states she also took some left over pain medication from delivery but this did not help, she does not remember exactly what it was.  All of patient's prior labs here have been WNL.  Patient did recently undergotreatment and psychiatric admission to [facility] for opiate abuse in October 2016.  Will add on tylenol and salicylate levels, coags, ethanol, UDS, and obtain abdominal ultrasound.    11:25 AM Remainder of labs and abdominal ultrasound WNL, specifically tylenol level WNL.  Hepatitis panel pending.  Last hep panel performed in out system was in 2013-- was negative for Hep C at that time.  Case discussed with poison control, Cheryl (RN)-- mixed picture but given new transaminitis even with hx of hep C, recommend 24 hours of acetodate  mg/kg load then 70 mg/kg Q4H.  Give in 24 hour increments.  2-3 hours before 24 hours is up, re-check AST, ALT, and INR.  If remain irregular, may need additional 24 hours of acetadote, otherwise may be discharged.  12:19 PM Case discussed with hospitalist, Dr. Ardyth Harps-- will admit to med-surg.  Garlon Hatchet, PA-C 06/16/15 1257  [name], MD 06/16/15 [phone]

## 2015-06-17 ENCOUNTER — Encounter (HOSPITAL_COMMUNITY): Payer: Self-pay | Admitting: Internal Medicine

## 2015-06-17 DIAGNOSIS — R768 Other specified abnormal immunological findings in serum: Secondary | ICD-10-CM

## 2015-06-17 DIAGNOSIS — Z87898 Personal history of other specified conditions: Secondary | ICD-10-CM

## 2015-06-17 DIAGNOSIS — R740 Nonspecific elevation of levels of transaminase and lactic acid dehydrogenase [LDH]: Secondary | ICD-10-CM

## 2015-06-17 DIAGNOSIS — R894 Abnormal immunological findings in specimens from other organs, systems and tissues: Secondary | ICD-10-CM

## 2015-06-17 DIAGNOSIS — K279 Peptic ulcer, site unspecified, unspecified as acute or chronic, without hemorrhage or perforation: Secondary | ICD-10-CM | POA: Diagnosis present

## 2015-06-17 DIAGNOSIS — R1013 Epigastric pain: Secondary | ICD-10-CM | POA: Diagnosis present

## 2015-06-17 HISTORY — DX: Other specified abnormal immunological findings in serum: R76.8

## 2015-06-17 LAB — COMPREHENSIVE METABOLIC PANEL
ALT: 356 U/L — ABNORMAL HIGH (ref 14–54)
ANION GAP: 7 (ref 5–15)
AST: 111 U/L — AB (ref 15–41)
Albumin: 3.2 g/dL — ABNORMAL LOW (ref 3.5–5.0)
Alkaline Phosphatase: 99 U/L (ref 38–126)
BILIRUBIN TOTAL: 0.5 mg/dL (ref 0.3–1.2)
BUN: 9 mg/dL (ref 6–20)
CHLORIDE: 113 mmol/L — AB (ref 101–111)
CO2: 23 mmol/L (ref 22–32)
Calcium: 8.3 mg/dL — ABNORMAL LOW (ref 8.9–10.3)
Creatinine, Ser: 0.67 mg/dL (ref 0.44–1.00)
Glucose, Bld: 96 mg/dL (ref 65–99)
POTASSIUM: 4.1 mmol/L (ref 3.5–5.1)
Sodium: 143 mmol/L (ref 135–145)
TOTAL PROTEIN: 5.8 g/dL — AB (ref 6.5–8.1)

## 2015-06-17 LAB — CBC
HEMATOCRIT: 35.5 % — AB (ref 36.0–46.0)
Hemoglobin: 11.7 g/dL — ABNORMAL LOW (ref 12.0–15.0)
MCH: 29.3 pg (ref 26.0–34.0)
MCHC: 33 g/dL (ref 30.0–36.0)
MCV: 89 fL (ref 78.0–100.0)
Platelets: 223 10*3/uL (ref 150–400)
RBC: 3.99 MIL/uL (ref 3.87–5.11)
RDW: 14.1 % (ref 11.5–15.5)
WBC: 6.9 10*3/uL (ref 4.0–10.5)

## 2015-06-17 LAB — HEPATITIS PANEL, ACUTE
HEP A IGM: NEGATIVE
HEP B S AG: NEGATIVE
Hep B C IgM: NEGATIVE

## 2015-06-17 LAB — ACETAMINOPHEN LEVEL

## 2015-06-17 MED ORDER — ONDANSETRON HCL 4 MG PO TABS: 4.0000 mg | ORAL_TABLET | Freq: Four times a day (QID) | ORAL | Status: DC | PRN

## 2015-06-17 MED ORDER — OXYCODONE HCL 5 MG PO TABS: 5.0000 mg | ORAL_TABLET | Freq: Three times a day (TID) | ORAL | Status: DC | PRN

## 2015-06-17 MED ORDER — PANTOPRAZOLE SODIUM 40 MG PO TBEC: 40.0000 mg | DELAYED_RELEASE_TABLET | Freq: Every day | ORAL | Status: DC

## 2015-06-17 NOTE — Progress Notes (Signed)
Patient discharged home.  Reviewed medications and DC instructions.  Follow up in place, strongly encouraged patient to be sure to go.  No questions at this time.  Instructed to place heat on infiltration site to decrease swelling. Stable to DC a this time, ambulated off unit with RN assist.  Requested to wait downstairs until ride arrives

## 2015-06-17 NOTE — Discharge Summary (Signed)
Physician Discharge Summary  Becky [name] ZOX:[id number] DOB: [birth year] DOA: 06/16/2015  PCP: No PCP Per Patient  Admit date: 06/16/2015 Discharge date: 06/17/2015  Recommendations for Outpatient Follow-up:  1. Follow up with GI outpt for newly diagnosed HCV  Follow-up Information    Follow up with Ranae Pila, NP On 06/30/2015.   Specialty:  Gastroenterology   Why:  at 10:00 am   Contact information:   [address] [place] [address] [phone]      Discharge Diagnoses:  1. Transaminitis 2. Abdominal pain, epigastric 3. Substance Abuse  Discharge Condition: Stable Disposition: home  Diet recommendation: regular diet  Filed Weights   06/16/15 0904  Weight: 74.844 kg (165 lb)    History of present illness:  Patient is a 25 year old young lady with history of substance abuse including IV heroin in the past, bipolar disorder who presents to the hospital today with epigastric abdominal pain that has been present for a few months but became acutely worse today. She has a history of peptic ulcers and thought that this might be the etiology. In addition to this she also has some lower abdominal pain which she thought was cramps due to her menstrual cycle and she has been doing significant amounts of ibuprofen and Tylenol at home although she is unable to quantify how much. Upon arrival to the ED she was noted to have significant transaminitis, and abdominal ultrasound shows no acute abnormalities. She thinks she might have been told at some point that she has hepatitis C although she has never had any follow-up or treatment for this. Tylenol level is normal, however after discussion that EDP had with poison control, they're recommending overnight observation to treat with Mucomyst and recheck Tylenol levels in a.m.  Hospital Course:  Patient was admitted to medical floor and given IVF overnight.  Repeat laboratory data was collected in the morning of 06/17/15 and  transaminases were stable (ALT) and trending down (Alk. Phos, AST).  Repeat tylenol level done at 24 hours was <10.  Patient was tolerating PO without complaint.  Nursing staff did report that patient had refused Mucomyst due to taste during admission. With negative U/S, tylenol level <10 at 24 hour and downtrending LFTs patient was deemed stable to discharge with outpatient follow up with GI for newly diagnosed HCV.   Discharge Instructions  Discharge Instructions    Call MD for:  persistant nausea and vomiting    Complete by:  As directed      Call MD for:  severe uncontrolled pain    Complete by:  As directed      Diet - low sodium heart healthy    Complete by:  As directed      Discharge instructions    Complete by:  As directed   Follow up with GI outpatient at scheduled appointment time and date     Increase activity slowly    Complete by:  As directed           Current Discharge Medication List    START taking these medications   Details  ondansetron (ZOFRAN) 4 MG tablet Take 1 tablet (4 mg total) by mouth every 6 (six) hours as needed for nausea. Qty: 20 tablet, Refills: 0    oxyCODONE (OXY IR/ROXICODONE) 5 MG immediate release tablet Take 1 tablet (5 mg total) by mouth every 8 (eight) hours as needed for severe pain. Qty: 30 tablet, Refills: 0      CONTINUE these medications which have  CHANGED   Details  pantoprazole (PROTONIX) 40 MG tablet Take 1 tablet (40 mg total) by mouth daily. Qty: 30 tablet, Refills: 0      CONTINUE these medications which have NOT CHANGED   Details  escitalopram (LEXAPRO) 20 MG tablet Take 20 mg by mouth daily.    gabapentin (NEURONTIN) 600 MG tablet Take 600 mg by mouth 3 (three) times daily.    mirtazapine (REMERON) 15 MG tablet Take 15 mg by mouth at bedtime.    QUEtiapine (SEROQUEL) 100 MG tablet Take 100 mg by mouth 2 (two) times daily.      STOP taking these medications     Ibuprofen (MIDOL PO)        Allergies  Allergen  Reactions  . Nsaids Other (See Comments)    Peptic ulcers   . Tylenol [Acetaminophen] Itching and Rash    The results of significant diagnostics from this hospitalization (including imaging, microbiology, ancillary and laboratory) are listed below for reference.    Significant Diagnostic Studies: US Abdomen Complete  06/16/2015  CLINICAL DATA:  Hepatitis-C, elevated LFTs, worsening abdominal pain, smoker EXAM: ABDOMEN ULTRASOUND COMPLETE COMPARISON:  CT abdomen and pelvis 09/04/2013 FINDINGS: Gallbladder: Normal distend without stones or wall thickening. No pericholecystic fluid or sonographic Murphy sign. Common bile duct: Diameter: Normal caliber 4 mm diameter Liver: Normal appearance IVC: Normal appearance Pancreas: Tail obscured by bowel gas. Visualized portion normal appearance. Spleen: Normal appearance, 9.5 cm length Right Kidney: Length: 11.7 cm. Normal morphology without mass or hydronephrosis. Left Kidney: Length: 11.8 cm. Normal morphology without mass or hydronephrosis. Abdominal aorta: Normal caliber Other findings: No free fluid IMPRESSION: Incomplete visualization of pancreatic tail. Otherwise normal exam. Electronically Signed   By: Lavonia Dana M.D.   On: 06/16/2015 11:45    Microbiology: No results found for this or any previous visit (from the past 240 hour(s)).   Labs: Basic Metabolic Panel:  Recent Labs Lab 06/16/15 0918 06/17/15 0623  NA 141 143  K 3.7 4.1  CL 105 113*  CO2 26 23  GLUCOSE 113* 96  BUN 9 9  CREATININE 0.78 0.67  CALCIUM 8.8* 8.3*   Liver Function Tests:  Recent Labs Lab 06/16/15 0918 06/17/15 0623  AST 109* 111*  ALT 413* 356*  ALKPHOS 119 99  BILITOT 0.7 0.5  PROT 7.0 5.8*  ALBUMIN 4.1 3.2*    Recent Labs Lab 06/16/15 0918  LIPASE 51   No results for input(s): AMMONIA in the last 168 hours. CBC:  Recent Labs Lab 06/16/15 0918 06/17/15 0623  WBC 6.4 6.9  HGB 13.2 11.7*  HCT 39.8 35.5*  MCV 87.9 89.0  PLT 248 223    Cardiac Enzymes: No results for input(s): CKTOTAL, CKMB, CKMBINDEX, TROPONINI in the last 168 hours. BNP: BNP (last 3 results) No results for input(s): BNP in the last 8760 hours.  ProBNP (last 3 results) No results for input(s): PROBNP in the last 8760 hours.  CBG: No results for input(s): GLUCAP in the last 168 hours.  Principal Problem:   Transaminitis Active Problems:   History of substance abuse   Abdominal pain, epigastric   Hepatitis C antibody test positive   Peptic ulcer disease   Time coordinating discharge: >30 minutes  Signed: Coralie Common Resident Physician 06/17/2015, 12:56 PM

## 2015-06-17 NOTE — Progress Notes (Signed)
Recommendations from poison control communicated to Dr. Lawson Radar.

## 2015-06-18 LAB — HIV ANTIBODY (ROUTINE TESTING W REFLEX): HIV Screen 4th Generation wRfx: NONREACTIVE

## 2015-06-30 ENCOUNTER — Inpatient Hospital Stay: Payer: Self-pay | Admitting: Nurse Practitioner

## 2015-07-01 ENCOUNTER — Emergency Department (HOSPITAL_COMMUNITY)
Admission: EM | Admit: 2015-07-01 | Discharge: 2015-07-02 | Discharge status: Against Medical Advice | Payer: Medicaid Other | Attending: Emergency Medicine | Admitting: Emergency Medicine

## 2015-07-01 ENCOUNTER — Encounter (HOSPITAL_COMMUNITY): Payer: Self-pay

## 2015-07-01 ENCOUNTER — Emergency Department (HOSPITAL_COMMUNITY): Payer: Medicaid Other

## 2015-07-01 ENCOUNTER — Inpatient Hospital Stay: Payer: Self-pay | Admitting: Nurse Practitioner

## 2015-07-01 DIAGNOSIS — F1410 Cocaine abuse, uncomplicated: Secondary | ICD-10-CM | POA: Insufficient documentation

## 2015-07-01 DIAGNOSIS — F1510 Other stimulant abuse, uncomplicated: Secondary | ICD-10-CM | POA: Insufficient documentation

## 2015-07-01 DIAGNOSIS — T401X1A Poisoning by heroin, accidental (unintentional), initial encounter: Secondary | ICD-10-CM | POA: Insufficient documentation

## 2015-07-01 DIAGNOSIS — F1310 Sedative, hypnotic or anxiolytic abuse, uncomplicated: Secondary | ICD-10-CM | POA: Insufficient documentation

## 2015-07-01 DIAGNOSIS — Z79899 Other long term (current) drug therapy: Secondary | ICD-10-CM | POA: Insufficient documentation

## 2015-07-01 DIAGNOSIS — F1910 Other psychoactive substance abuse, uncomplicated: Secondary | ICD-10-CM

## 2015-07-01 DIAGNOSIS — Z8711 Personal history of peptic ulcer disease: Secondary | ICD-10-CM | POA: Insufficient documentation

## 2015-07-01 DIAGNOSIS — Z8709 Personal history of other diseases of the respiratory system: Secondary | ICD-10-CM | POA: Insufficient documentation

## 2015-07-01 DIAGNOSIS — F1110 Opioid abuse, uncomplicated: Secondary | ICD-10-CM | POA: Insufficient documentation

## 2015-07-01 DIAGNOSIS — T424X1A Poisoning by benzodiazepines, accidental (unintentional), initial encounter: Secondary | ICD-10-CM | POA: Insufficient documentation

## 2015-07-01 DIAGNOSIS — F319 Bipolar disorder, unspecified: Secondary | ICD-10-CM | POA: Insufficient documentation

## 2015-07-01 DIAGNOSIS — F1210 Cannabis abuse, uncomplicated: Secondary | ICD-10-CM | POA: Insufficient documentation

## 2015-07-01 DIAGNOSIS — F17210 Nicotine dependence, cigarettes, uncomplicated: Secondary | ICD-10-CM | POA: Insufficient documentation

## 2015-07-01 DIAGNOSIS — Z3202 Encounter for pregnancy test, result negative: Secondary | ICD-10-CM | POA: Insufficient documentation

## 2015-07-01 HISTORY — DX: Other psychoactive substance abuse, uncomplicated: F19.10

## 2015-07-01 LAB — CBC WITH DIFFERENTIAL/PLATELET
BASOS ABS: 0 10*3/uL (ref 0.0–0.1)
Basophils Relative: 0 %
EOS PCT: 1 %
Eosinophils Absolute: 0.2 10*3/uL (ref 0.0–0.7)
HEMATOCRIT: 36.9 % (ref 36.0–46.0)
Hemoglobin: 12.3 g/dL (ref 12.0–15.0)
LYMPHS ABS: 2.8 10*3/uL (ref 0.7–4.0)
LYMPHS PCT: 21 %
MCH: 29.9 pg (ref 26.0–34.0)
MCHC: 33.3 g/dL (ref 30.0–36.0)
MCV: 89.8 fL (ref 78.0–100.0)
Monocytes Absolute: 0.7 10*3/uL (ref 0.1–1.0)
Monocytes Relative: 5 %
NEUTROS ABS: 9.6 10*3/uL — AB (ref 1.7–7.7)
Neutrophils Relative %: 73 %
PLATELETS: 269 10*3/uL (ref 150–400)
RBC: 4.11 MIL/uL (ref 3.87–5.11)
RDW: 14.1 % (ref 11.5–15.5)
WBC: 13.3 10*3/uL — AB (ref 4.0–10.5)

## 2015-07-01 LAB — RAPID URINE DRUG SCREEN, HOSP PERFORMED
AMPHETAMINES: POSITIVE — AB
BARBITURATES: NOT DETECTED
BENZODIAZEPINES: POSITIVE — AB
Cocaine: POSITIVE — AB
Opiates: POSITIVE — AB
TETRAHYDROCANNABINOL: POSITIVE — AB

## 2015-07-01 LAB — URINALYSIS, ROUTINE W REFLEX MICROSCOPIC
BILIRUBIN URINE: NEGATIVE
Glucose, UA: NEGATIVE mg/dL
HGB URINE DIPSTICK: NEGATIVE
KETONES UR: NEGATIVE mg/dL
Leukocytes, UA: NEGATIVE
NITRITE: NEGATIVE
PROTEIN: NEGATIVE mg/dL
pH: 6 (ref 5.0–8.0)

## 2015-07-01 LAB — COMPREHENSIVE METABOLIC PANEL
ALBUMIN: 3.8 g/dL (ref 3.5–5.0)
ALK PHOS: 99 U/L (ref 38–126)
ALT: 283 U/L — ABNORMAL HIGH (ref 14–54)
ANION GAP: 10 (ref 5–15)
AST: 69 U/L — AB (ref 15–41)
BUN: 8 mg/dL (ref 6–20)
CO2: 24 mmol/L (ref 22–32)
Calcium: 8.5 mg/dL — ABNORMAL LOW (ref 8.9–10.3)
Chloride: 104 mmol/L (ref 101–111)
Creatinine, Ser: 0.9 mg/dL (ref 0.44–1.00)
GFR calc Af Amer: >60 mL/min (ref >60)
GFR calc non Af Amer: >60 mL/min (ref >60)
GLUCOSE: 85 mg/dL (ref 65–99)
POTASSIUM: 4.1 mmol/L (ref 3.5–5.1)
SODIUM: 138 mmol/L (ref 135–145)
Total Bilirubin: 0.2 mg/dL — ABNORMAL LOW (ref 0.3–1.2)
Total Protein: 6.7 g/dL (ref 6.5–8.1)

## 2015-07-01 LAB — ACETAMINOPHEN LEVEL: Acetaminophen (Tylenol), Serum: <10 ug/mL — ABNORMAL LOW (ref 10–30)

## 2015-07-01 LAB — ETHANOL: Alcohol, Ethyl (B): <5 mg/dL (ref <5)

## 2015-07-01 LAB — PREGNANCY, URINE: PREG TEST UR: NEGATIVE

## 2015-07-01 LAB — SALICYLATE LEVEL: Salicylate Lvl: <4 mg/dL (ref 2.8–30.0)

## 2015-07-01 MED ORDER — NALOXONE HCL 2 MG/2ML IJ SOSY
2.0000 mg | PREFILLED_SYRINGE | Freq: Once | INTRAMUSCULAR | Status: AC
  Administered 2015-07-01: 2 mg via INTRAVENOUS
  Filled 2015-07-01: qty 2

## 2015-07-01 NOTE — ED Notes (Signed)
Patient complains of headache. Dr. Estell Harpin made aware.

## 2015-07-01 NOTE — Discharge Instructions (Signed)
Stop using all drugs and follow up with [facility]

## 2015-07-01 NOTE — ED Notes (Signed)
Patient had of Normal saline IV per EMS PTA.

## 2015-07-01 NOTE — ED Notes (Signed)
Patient drowsy. Arrousable to verbal command. Patient alert and oriented x4. Dr. Estell Harpin made aware. Respirations even and unlabored. NAD noted.

## 2015-07-01 NOTE — ED Provider Notes (Signed)
CSN: [id number]     Arrival date & time 07/01/15  2031 History  By signing my name below, I, Becky [name], attest that this documentation has been prepared under the direction and in the presence of Bethann Berkshire, MD Electronically Signed: Charlean Merl, ED Scribe 07/01/2015 at 9:03 PM.    Chief Complaint  Patient presents with  . Drug Overdose   HPI Comments: Pt complains of a drug overdose on heroin and Xanax.  Patient is a 25 y.o. female presenting with Overdose. The history is provided by the patient. No language interpreter was used.  Drug Overdose This is a recurrent problem. The current episode started 1 to 2 hours ago. The problem occurs every several days. The problem has been gradually improving. Pertinent negatives include no chest pain, no abdominal pain and no headaches. Treatments tried: 2 mg Narcan.    HPI Comments: Becky [name] is a 25 y.o. female with a positive Hepatitis C antibody test, and recently out of rehab, brought in by ambulance, who presents to the Emergency Department complaining of a drug overdose on pills - approximately 5mg  Xanax- and heroin.  On scene, EMS administered 2mg  of Narcan IN and pt was aroused. PTA, pt was alert and oriented x4. Pt is currently very lethargic.   pinpint pupils , very lethartgic Past Medical History  Diagnosis Date  . Bronchitis   . Peptic ulcer   . Bipolar 1 disorder (HCC)   . History of substance abuse 06/16/2015  . Hepatitis C antibody test positive 06/17/2015  . Polysubstance abuse    Past Surgical History  Procedure Laterality Date  . Other surgical history      scar tissue removed from right ovary   . Other surgical history      fallopian tube repair  . Wisdom tooth extraction     Family History  Problem Relation Age of Onset  . Cancer Other   . Drug abuse Brother    Social History  Substance Use Topics  . Smoking status: Current Every Day Smoker -- 1.00 packs/day for 10 years    Types:  Cigarettes  . Smokeless tobacco: Never Used  . Alcohol Use: Yes     Comment: occ   OB History    Gravida Para Term Preterm AB TAB SAB Ectopic Multiple Living   2    1     1      Review of Systems  Constitutional: Negative for appetite change and fatigue.  HENT: Negative for congestion, ear discharge and sinus pressure.   Eyes: Negative for discharge.  Respiratory: Negative for cough.   Cardiovascular: Negative for chest pain.  Gastrointestinal: Negative for abdominal pain and diarrhea.  Genitourinary: Negative for frequency and hematuria.  Musculoskeletal: Negative for back pain.  Skin: Negative for rash.  Neurological: Negative for seizures and headaches.  Psychiatric/Behavioral: Negative for hallucinations.      Allergies  Nsaids and Tylenol  Home Medications   Prior to Admission medications   Medication Sig Start Date End Date Taking? Authorizing Provider  citalopram (CELEXA) 20 MG tablet Take 20 mg by mouth every morning.   Yes Historical Provider, MD  gabapentin (NEURONTIN) 600 MG tablet Take 600 mg by mouth 3 (three) times daily.   Yes Historical Provider, MD  levothyroxine (SYNTHROID, LEVOTHROID) 50 MCG tablet Take 50 mcg by mouth every morning.   Yes Historical Provider, MD  prenatal vitamin w/FE, FA (PRENATAL 1 + 1) 27-1 MG TABS tablet Take 1 tablet by mouth every morning.  Yes Historical Provider, MD  traZODone (DESYREL) 150 MG tablet Take 150 mg by mouth at bedtime.   Yes Historical Provider, MD  escitalopram (LEXAPRO) 20 MG tablet Take 20 mg by mouth daily.    Historical Provider, MD  mirtazapine (REMERON) 15 MG tablet Take 15 mg by mouth at bedtime.    Historical Provider, MD  ondansetron (ZOFRAN) 4 MG tablet Take 1 tablet (4 mg total) by mouth every 6 (six) hours as needed for nausea. 06/17/15   Elliot Cousin, MD  oxyCODONE (OXY IR/ROXICODONE) 5 MG immediate release tablet Take 1 tablet (5 mg total) by mouth every 8 (eight) hours as needed for severe pain. 06/17/15    Elliot Cousin, MD  pantoprazole (PROTONIX) 40 MG tablet Take 1 tablet (40 mg total) by mouth daily. 06/17/15   Elliot Cousin, MD  QUEtiapine (SEROQUEL) 100 MG tablet Take 100 mg by mouth 2 (two) times daily.    Historical Provider, MD   BP 112/73 mmHg  Pulse 107  Temp(Src) 99 F (37.2 C) (Oral)  Resp 14  Ht  (1.626 m)  Wt 165 lb (74.844 kg)  BMI 28.31 kg/m2  SpO2 99%  LMP 06/15/2015 Physical Exam  Constitutional: She appears well-developed. She appears lethargic.  HENT:  Head: Normocephalic.  Eyes: Conjunctivae and EOM are normal. No scleral icterus.  Pinpoint pupils.  Neck: Neck supple. No thyromegaly present.  Cardiovascular: Normal rate and regular rhythm.  Exam reveals no gallop and no friction rub.   No murmur heard. Pulmonary/Chest: No stridor. She has no wheezes. She has no rales. She exhibits no tenderness.  Abdominal: She exhibits no distension. There is no tenderness. There is no rebound.  Musculoskeletal: Normal range of motion. She exhibits no edema.  Lymphadenopathy:    She has no cervical adenopathy.  Neurological: She appears lethargic. She exhibits normal muscle tone. Coordination normal.  Skin: No rash noted. No erythema.  Psychiatric: She has a normal mood and affect. Her behavior is normal.    ED Course  Procedures  DIAGNOSTIC STUDIES: Oxygen Saturation is 99% on [place], normal by my interpretation.    COORDINATION OF CARE: 8:55 PM-Discussed treatment plan which includes DG Chest, blood work, EtOH, Urine drug screen, Urinalysis, pregnancy urine test, and an EKG  with pt at bedside and pt agreed to plan.   Labs Review Labs Reviewed  ACETAMINOPHEN LEVEL  COMPREHENSIVE METABOLIC PANEL  ETHANOL  SALICYLATE LEVEL  CBC WITH DIFFERENTIAL/PLATELET  URINE RAPID DRUG SCREEN, HOSP PERFORMED  URINALYSIS, ROUTINE W REFLEX MICROSCOPIC (NOT AT [facility])  PREGNANCY, URINE    Imaging Review Dg Chest Port 1 View  07/01/2015  CLINICAL DATA:  25 year old female with  unresponsiveness EXAM: PORTABLE CHEST 1 VIEW COMPARISON:  Radiograph dated 02/09/2014 FINDINGS: Single portable view of the chest demonstrates poor inspiratory effort with low lung volumes. There is mild diffuse interstitial prominence which may represent a degree of congestion versus edema. Superimposed pneumonia is not excluded. No focal consolidation, pleural effusion, or pneumothorax. Top-normal cardiac size. No acute osseous pathology. IMPRESSION: Mild central vascular and interstitial prominence may represent a degree of congestion versus edema. Clinical correlation is recommended. Electronically Signed   By: Elgie Collard M.D.   On: 07/01/2015 21:00   I have personally reviewed and evaluated these images and lab results as part of my medical decision-making.   EKG Interpretation None     CRITICAL CARE Performed by: Gailya Tauer L Total critical care time: 40 minutes Critical care time was exclusive of separately billable procedures  and treating other patients. Critical care was necessary to treat or prevent imminent or life-threatening deterioration. Critical care was time spent personally by me on the following activities: development of treatment plan with patient and/or surrogate as well as nursing, discussions with consultants, evaluation of patient's response to treatment, examination of patient, obtaining history from patient or surrogate, ordering and performing treatments and interventions, ordering and review of laboratory studies, ordering and review of radiographic studies, pulse oximetry and re-evaluation of patient's condition.\  MDM   Final diagnoses:  None     Patient's drug screen was positive for numerous medicines. She reiterates that she does not want to kill herself and she does not want to die. She is an outpatient drug addiction treatment with [facility].   Patient was instructed that we need to keep her here in emergency department for 1-2 hours longer to make sure  she wakes up appropriately. She was not happy with that and may leave AMA.     Bethann Berkshire, MD 07/01/15 [phone]

## 2015-07-01 NOTE — ED Notes (Signed)
Pt ambulated to BR.  Tolerating p.o fluids.

## 2015-07-01 NOTE — ED Notes (Signed)
Patient states she took approx. 15 ( ) xanax.

## 2015-07-01 NOTE — ED Notes (Signed)
Patient cursing and using foul language.

## 2015-07-01 NOTE — ED Notes (Signed)
EMS reports patient was found agonal respirations on arrival. Patient states she took some pills and heroin. Patient recently out of rehab. EMS administered 2 mg of Narcan nasal and patient became aroused. Patient alert and oriented x4. NAD noted.

## 2015-07-01 NOTE — ED Notes (Signed)
Patients sats 90% on RA. Patient placed on 2 LPM of oxygen via [place]. Sats up to 99%.

## 2015-07-01 NOTE — ED Notes (Signed)
Patient agrees to let lab draw blood.

## 2015-07-01 NOTE — ED Notes (Signed)
Patient not letting staff take blood work. Dr. Estell Harpin made aware.

## 2015-07-02 ENCOUNTER — Inpatient Hospital Stay (HOSPITAL_COMMUNITY)
Admission: EM | Admit: 2015-07-02 | Discharge: 2015-07-05 | DRG: 917 | Discharge status: Against Medical Advice | Payer: Self-pay | Attending: Internal Medicine | Admitting: Internal Medicine

## 2015-07-02 ENCOUNTER — Emergency Department (HOSPITAL_COMMUNITY): Payer: Self-pay

## 2015-07-02 ENCOUNTER — Encounter (HOSPITAL_COMMUNITY): Payer: Self-pay

## 2015-07-02 DIAGNOSIS — T407X1A Poisoning by cannabis (derivatives), accidental (unintentional), initial encounter: Secondary | ICD-10-CM | POA: Diagnosis present

## 2015-07-02 DIAGNOSIS — F1911 Other psychoactive substance abuse, in remission: Secondary | ICD-10-CM

## 2015-07-02 DIAGNOSIS — J9690 Respiratory failure, unspecified, unspecified whether with hypoxia or hypercapnia: Secondary | ICD-10-CM

## 2015-07-02 DIAGNOSIS — F3130 Bipolar disorder, current episode depressed, mild or moderate severity, unspecified: Secondary | ICD-10-CM

## 2015-07-02 DIAGNOSIS — J45909 Unspecified asthma, uncomplicated: Secondary | ICD-10-CM | POA: Diagnosis present

## 2015-07-02 DIAGNOSIS — I468 Cardiac arrest due to other underlying condition: Secondary | ICD-10-CM | POA: Diagnosis present

## 2015-07-02 DIAGNOSIS — R402432 Glasgow coma scale score 3-8, at arrival to emergency department: Secondary | ICD-10-CM | POA: Diagnosis present

## 2015-07-02 DIAGNOSIS — J42 Unspecified chronic bronchitis: Secondary | ICD-10-CM | POA: Diagnosis present

## 2015-07-02 DIAGNOSIS — R509 Fever, unspecified: Secondary | ICD-10-CM

## 2015-07-02 DIAGNOSIS — F1910 Other psychoactive substance abuse, uncomplicated: Secondary | ICD-10-CM

## 2015-07-02 DIAGNOSIS — K219 Gastro-esophageal reflux disease without esophagitis: Secondary | ICD-10-CM | POA: Diagnosis present

## 2015-07-02 DIAGNOSIS — N179 Acute kidney failure, unspecified: Secondary | ICD-10-CM | POA: Diagnosis present

## 2015-07-02 DIAGNOSIS — T43621A Poisoning by amphetamines, accidental (unintentional), initial encounter: Secondary | ICD-10-CM | POA: Diagnosis present

## 2015-07-02 DIAGNOSIS — R7401 Elevation of levels of liver transaminase levels: Secondary | ICD-10-CM | POA: Diagnosis present

## 2015-07-02 DIAGNOSIS — T401X1A Poisoning by heroin, accidental (unintentional), initial encounter: Principal | ICD-10-CM | POA: Diagnosis present

## 2015-07-02 DIAGNOSIS — Z8711 Personal history of peptic ulcer disease: Secondary | ICD-10-CM

## 2015-07-02 DIAGNOSIS — T405X1A Poisoning by cocaine, accidental (unintentional), initial encounter: Secondary | ICD-10-CM | POA: Diagnosis present

## 2015-07-02 DIAGNOSIS — J9600 Acute respiratory failure, unspecified whether with hypoxia or hypercapnia: Secondary | ICD-10-CM | POA: Diagnosis present

## 2015-07-02 DIAGNOSIS — T50901A Poisoning by unspecified drugs, medicaments and biological substances, accidental (unintentional), initial encounter: Secondary | ICD-10-CM | POA: Diagnosis present

## 2015-07-02 DIAGNOSIS — E872 Acidosis, unspecified: Secondary | ICD-10-CM

## 2015-07-02 DIAGNOSIS — Z978 Presence of other specified devices: Secondary | ICD-10-CM

## 2015-07-02 DIAGNOSIS — F17210 Nicotine dependence, cigarettes, uncomplicated: Secondary | ICD-10-CM | POA: Diagnosis present

## 2015-07-02 DIAGNOSIS — R4182 Altered mental status, unspecified: Secondary | ICD-10-CM | POA: Insufficient documentation

## 2015-07-02 DIAGNOSIS — R740 Nonspecific elevation of levels of transaminase and lactic acid dehydrogenase [LDH]: Secondary | ICD-10-CM

## 2015-07-02 DIAGNOSIS — E875 Hyperkalemia: Secondary | ICD-10-CM | POA: Diagnosis present

## 2015-07-02 DIAGNOSIS — E039 Hypothyroidism, unspecified: Secondary | ICD-10-CM | POA: Diagnosis present

## 2015-07-02 DIAGNOSIS — F319 Bipolar disorder, unspecified: Secondary | ICD-10-CM | POA: Diagnosis present

## 2015-07-02 DIAGNOSIS — A419 Sepsis, unspecified organism: Secondary | ICD-10-CM | POA: Diagnosis present

## 2015-07-02 DIAGNOSIS — B1920 Unspecified viral hepatitis C without hepatic coma: Secondary | ICD-10-CM | POA: Diagnosis present

## 2015-07-02 LAB — BLOOD GAS, ARTERIAL
ACID-BASE DEFICIT: 5.2 mmol/L — AB (ref 0.0–2.0)
Acid-base deficit: 5.2 mmol/L — ABNORMAL HIGH (ref 0.0–2.0)
BICARBONATE: 19.5 meq/L — AB (ref 20.0–24.0)
Bicarbonate: 20.5 mEq/L (ref 20.0–24.0)
DRAWN BY: 277331
Drawn by: 277331
FIO2: 0.8
FIO2: 0.7
LHR: 18 {breaths}/min
MECHVT: 440 mL
MECHVT: 440 mL
O2 Saturation: 95 %
O2 Saturation: 99.1 %
PEEP: 8 cmH2O
PEEP: 5 cmH2O
PH ART: 7.244 — AB (ref 7.350–7.450)
Patient temperature: 37.6
Patient temperature: 38.9
RATE: 22 resp/min
pCO2 arterial: 50.4 mmHg — ABNORMAL HIGH (ref 35.0–45.0)
pCO2 arterial: 36 mmHg (ref 35.0–45.0)
pH, Arterial: 7.35 (ref 7.350–7.450)
pO2, Arterial: 84.5 mmHg (ref 80.0–100.0)
pO2, Arterial: 190 mmHg — ABNORMAL HIGH (ref 80.0–100.0)

## 2015-07-02 LAB — PROTIME-INR
INR: 1.23 (ref 0.00–1.49)
PROTHROMBIN TIME: 15.7 s — AB (ref 11.6–15.2)

## 2015-07-02 LAB — URINALYSIS, ROUTINE W REFLEX MICROSCOPIC
BILIRUBIN URINE: NEGATIVE
GLUCOSE, UA: NEGATIVE mg/dL
KETONES UR: NEGATIVE mg/dL
Leukocytes, UA: NEGATIVE
NITRITE: NEGATIVE
PH: 5.5 (ref 5.0–8.0)
Protein, ur: 30 mg/dL — AB
SPECIFIC GRAVITY, URINE: 1.025 (ref 1.005–1.030)

## 2015-07-02 LAB — LACTIC ACID, PLASMA
LACTIC ACID, VENOUS: 4.8 mmol/L — AB (ref 0.5–2.0)
LACTIC ACID, VENOUS: 5.9 mmol/L — AB (ref 0.5–2.0)
LACTIC ACID, VENOUS: 4.9 mmol/L — AB (ref 0.5–2.0)

## 2015-07-02 LAB — COMPREHENSIVE METABOLIC PANEL
ALBUMIN: 3.4 g/dL — AB (ref 3.5–5.0)
ALK PHOS: 101 U/L (ref 38–126)
ALT: 238 U/L — AB (ref 14–54)
AST: 70 U/L — ABNORMAL HIGH (ref 15–41)
Anion gap: 11 (ref 5–15)
BILIRUBIN TOTAL: 0.5 mg/dL (ref 0.3–1.2)
BUN: 10 mg/dL (ref 6–20)
CALCIUM: 7.7 mg/dL — AB (ref 8.9–10.3)
CO2: 23 mmol/L (ref 22–32)
CREATININE: 1.65 mg/dL — AB (ref 0.44–1.00)
Chloride: 105 mmol/L (ref 101–111)
GFR calc Af Amer: 49 mL/min — ABNORMAL LOW (ref >60)
GFR calc non Af Amer: 42 mL/min — ABNORMAL LOW (ref >60)
GLUCOSE: 125 mg/dL — AB (ref 65–99)
Potassium: 5.6 mmol/L — ABNORMAL HIGH (ref 3.5–5.1)
SODIUM: 139 mmol/L (ref 135–145)
TOTAL PROTEIN: 6.5 g/dL (ref 6.5–8.1)

## 2015-07-02 LAB — CBC WITH DIFFERENTIAL/PLATELET
BASOS PCT: 0 %
Basophils Absolute: 0 10*3/uL (ref 0.0–0.1)
EOS PCT: 0 %
Eosinophils Absolute: 0 10*3/uL (ref 0.0–0.7)
HEMATOCRIT: 36.8 % (ref 36.0–46.0)
HEMOGLOBIN: 11.8 g/dL — AB (ref 12.0–15.0)
Lymphocytes Relative: 9 %
Lymphs Abs: 1.5 10*3/uL (ref 0.7–4.0)
MCH: 29.9 pg (ref 26.0–34.0)
MCHC: 32.1 g/dL (ref 30.0–36.0)
MCV: 93.2 fL (ref 78.0–100.0)
MONO ABS: 1.3 10*3/uL — AB (ref 0.1–1.0)
MONOS PCT: 8 %
Neutro Abs: 13.1 10*3/uL — ABNORMAL HIGH (ref 1.7–7.7)
Neutrophils Relative %: 83 %
Platelets: 291 10*3/uL (ref 150–400)
RBC: 3.95 MIL/uL (ref 3.87–5.11)
RDW: 14 % (ref 11.5–15.5)
WBC: 16 10*3/uL — ABNORMAL HIGH (ref 4.0–10.5)

## 2015-07-02 LAB — RAPID URINE DRUG SCREEN, HOSP PERFORMED
Amphetamines: NOT DETECTED
BARBITURATES: NOT DETECTED
Benzodiazepines: POSITIVE — AB
Cocaine: POSITIVE — AB
OPIATES: POSITIVE — AB
TETRAHYDROCANNABINOL: POSITIVE — AB

## 2015-07-02 LAB — URINE MICROSCOPIC-ADD ON

## 2015-07-02 LAB — APTT: aPTT: 28 seconds (ref 24–37)

## 2015-07-02 LAB — AMMONIA: Ammonia: 34 umol/L (ref 9–35)

## 2015-07-02 LAB — PROCALCITONIN: PROCALCITONIN: 8.53 ng/mL

## 2015-07-02 LAB — ETHANOL: Alcohol, Ethyl (B): <5 mg/dL (ref <5)

## 2015-07-02 MED ORDER — SODIUM CHLORIDE 0.9 % IV BOLUS (SEPSIS)
1000.0000 mL | INTRAVENOUS | Status: AC
  Administered 2015-07-02 (×2): 1000 mL via INTRAVENOUS

## 2015-07-02 MED ORDER — NOREPINEPHRINE BITARTRATE 1 MG/ML IV SOLN
INTRAVENOUS | Status: AC
  Filled 2015-07-02: qty 4

## 2015-07-02 MED ORDER — ANTISEPTIC ORAL RINSE SOLUTION (CORINZ)
7.0000 mL | Freq: Four times a day (QID) | OROMUCOSAL | Status: DC
  Administered 2015-07-03 – 2015-07-04 (×6): 7 mL via OROMUCOSAL

## 2015-07-02 MED ORDER — ONDANSETRON HCL 4 MG PO TABS: 4.0000 mg | ORAL_TABLET | Freq: Four times a day (QID) | ORAL | Status: DC | PRN

## 2015-07-02 MED ORDER — VANCOMYCIN HCL IN DEXTROSE 1-5 GM/200ML-% IV SOLN
1000.0000 mg | Freq: Once | INTRAVENOUS | Status: AC
  Administered 2015-07-02: 1000 mg via INTRAVENOUS
  Filled 2015-07-02: qty 200

## 2015-07-02 MED ORDER — CHLORHEXIDINE GLUCONATE 0.12% ORAL RINSE (MEDLINE KIT)
15.0000 mL | Freq: Two times a day (BID) | OROMUCOSAL | Status: DC
  Administered 2015-07-03 – 2015-07-05 (×6): 15 mL via OROMUCOSAL

## 2015-07-02 MED ORDER — LORAZEPAM 2 MG/ML IJ SOLN
2.0000 mg | Freq: Once | INTRAMUSCULAR | Status: AC
  Administered 2015-07-02: 2 mg via INTRAVENOUS

## 2015-07-02 MED ORDER — SODIUM CHLORIDE 0.9 % IV SOLN: INTRAVENOUS | Status: DC

## 2015-07-02 MED ORDER — LORAZEPAM 2 MG/ML IJ SOLN
1.0000 mg | INTRAMUSCULAR | Status: DC | PRN
  Administered 2015-07-05: 2 mg via INTRAVENOUS
  Filled 2015-07-02: qty 1

## 2015-07-02 MED ORDER — FENTANYL CITRATE (PF) 100 MCG/2ML IJ SOLN
INTRAMUSCULAR | Status: AC
  Filled 2015-07-02: qty 2

## 2015-07-02 MED ORDER — SODIUM CHLORIDE 0.9 % IV SOLN
INTRAVENOUS | Status: DC
  Administered 2015-07-02: 1000 mL via INTRAVENOUS
  Administered 2015-07-03 (×2): via INTRAVENOUS
  Administered 2015-07-03: 1000 mL via INTRAVENOUS
  Administered 2015-07-04: 16:00:00 via INTRAVENOUS
  Administered 2015-07-05: 1000 mL via INTRAVENOUS

## 2015-07-02 MED ORDER — DEXMEDETOMIDINE HCL IN NACL 200 MCG/50ML IV SOLN
0.2000 ug/kg/h | INTRAVENOUS | Status: AC
  Administered 2015-07-02: 0.2 ug/kg/h via INTRAVENOUS
  Administered 2015-07-03: 0.7 ug/kg/h via INTRAVENOUS
  Administered 2015-07-03: 0.4 ug/kg/h via INTRAVENOUS
  Administered 2015-07-03: 0.7 ug/kg/h via INTRAVENOUS
  Administered 2015-07-03: 0.6 ug/kg/h via INTRAVENOUS
  Administered 2015-07-04: 0.7 ug/kg/h via INTRAVENOUS
  Filled 2015-07-02 (×6): qty 50

## 2015-07-02 MED ORDER — FENTANYL CITRATE (PF) 100 MCG/2ML IJ SOLN
25.0000 ug | INTRAMUSCULAR | Status: DC | PRN
  Administered 2015-07-04 – 2015-07-05 (×7): 25 ug via INTRAVENOUS
  Filled 2015-07-02 (×7): qty 2

## 2015-07-02 MED ORDER — SODIUM CHLORIDE 0.9 % IV BOLUS (SEPSIS): 500.0000 mL | INTRAVENOUS | Status: AC

## 2015-07-02 MED ORDER — NOREPINEPHRINE BITARTRATE 1 MG/ML IV SOLN
0.0000 ug/min | INTRAVENOUS | Status: DC
  Filled 2015-07-02: qty 4

## 2015-07-02 MED ORDER — PANTOPRAZOLE SODIUM 40 MG IV SOLR
40.0000 mg | INTRAVENOUS | Status: DC
  Administered 2015-07-03 – 2015-07-04 (×3): 40 mg via INTRAVENOUS
  Filled 2015-07-02 (×3): qty 40

## 2015-07-02 MED ORDER — SUCCINYLCHOLINE CHLORIDE 20 MG/ML IJ SOLN
100.0000 mg | Freq: Once | INTRAMUSCULAR | Status: AC
  Administered 2015-07-02: 100 mg via INTRAVENOUS

## 2015-07-02 MED ORDER — PIPERACILLIN-TAZOBACTAM 3.375 G IVPB
3.3750 g | Freq: Three times a day (TID) | INTRAVENOUS | Status: DC
  Administered 2015-07-03 – 2015-07-05 (×8): 3.375 g via INTRAVENOUS
  Filled 2015-07-02 (×7): qty 50

## 2015-07-02 MED ORDER — SODIUM CHLORIDE 0.9 % IV SOLN: Freq: Once | INTRAVENOUS | Status: DC

## 2015-07-02 MED ORDER — KETOROLAC TROMETHAMINE 30 MG/ML IJ SOLN
30.0000 mg | Freq: Once | INTRAMUSCULAR | Status: AC
  Administered 2015-07-02: 30 mg via INTRAVENOUS
  Filled 2015-07-02: qty 1

## 2015-07-02 MED ORDER — VANCOMYCIN HCL IN DEXTROSE 750-5 MG/150ML-% IV SOLN
INTRAVENOUS | Status: AC
  Filled 2015-07-02: qty 150

## 2015-07-02 MED ORDER — SODIUM CHLORIDE 0.9 % IV BOLUS (SEPSIS)
500.0000 mL | Freq: Once | INTRAVENOUS | Status: AC
  Administered 2015-07-02: 500 mL via INTRAVENOUS

## 2015-07-02 MED ORDER — SODIUM CHLORIDE 0.9 % IV SOLN
Freq: Once | INTRAVENOUS | Status: AC
  Administered 2015-07-02: 14:00:00 via INTRAVENOUS

## 2015-07-02 MED ORDER — ETOMIDATE 2 MG/ML IV SOLN
0.3000 mg/kg | Freq: Once | INTRAVENOUS | Status: AC
  Administered 2015-07-02: 22.44 mg via INTRAVENOUS

## 2015-07-02 MED ORDER — NOREPINEPHRINE BITARTRATE 1 MG/ML IV SOLN
0.0000 ug/min | INTRAVENOUS | Status: DC
  Administered 2015-07-03: 5 ug/min via INTRAVENOUS
  Administered 2015-07-03: 10 ug/min via INTRAVENOUS
  Filled 2015-07-02 (×2): qty 4

## 2015-07-02 MED ORDER — PIPERACILLIN-TAZOBACTAM 3.375 G IVPB 30 MIN
3.3750 g | Freq: Once | INTRAVENOUS | Status: AC
  Administered 2015-07-02: 3.375 g via INTRAVENOUS
  Filled 2015-07-02: qty 50

## 2015-07-02 MED ORDER — LEVALBUTEROL HCL 0.63 MG/3ML IN NEBU
0.6300 mg | INHALATION_SOLUTION | Freq: Four times a day (QID) | RESPIRATORY_TRACT | Status: DC
  Administered 2015-07-02 – 2015-07-05 (×10): 0.63 mg via RESPIRATORY_TRACT
  Filled 2015-07-02 (×10): qty 3

## 2015-07-02 MED ORDER — HEPARIN SODIUM (PORCINE) 5000 UNIT/ML IJ SOLN
5000.0000 [IU] | Freq: Three times a day (TID) | INTRAMUSCULAR | Status: DC
  Administered 2015-07-03 – 2015-07-05 (×8): 5000 [IU] via SUBCUTANEOUS
  Filled 2015-07-02 (×8): qty 1

## 2015-07-02 MED ORDER — LORAZEPAM 2 MG/ML IJ SOLN
INTRAMUSCULAR | Status: AC
  Filled 2015-07-02: qty 1

## 2015-07-02 MED ORDER — ACETAMINOPHEN 650 MG RE SUPP
650.0000 mg | Freq: Once | RECTAL | Status: AC
  Administered 2015-07-02: 650 mg via RECTAL
  Filled 2015-07-02: qty 1

## 2015-07-02 MED ORDER — FENTANYL CITRATE (PF) 100 MCG/2ML IJ SOLN
100.0000 ug | Freq: Once | INTRAMUSCULAR | Status: AC
  Administered 2015-07-02: 100 ug via INTRAVENOUS

## 2015-07-02 MED ORDER — VANCOMYCIN HCL IN DEXTROSE 750-5 MG/150ML-% IV SOLN
750.0000 mg | Freq: Two times a day (BID) | INTRAVENOUS | Status: DC
  Administered 2015-07-03: 750 mg via INTRAVENOUS
  Filled 2015-07-02 (×2): qty 150

## 2015-07-02 MED ORDER — SODIUM CHLORIDE 0.9 % IV SOLN
1.0000 g | Freq: Once | INTRAVENOUS | Status: AC
  Administered 2015-07-02: 1 g via INTRAVENOUS
  Filled 2015-07-02: qty 10

## 2015-07-02 MED ORDER — ONDANSETRON HCL 4 MG/2ML IJ SOLN
4.0000 mg | Freq: Four times a day (QID) | INTRAMUSCULAR | Status: DC | PRN
  Administered 2015-07-04: 4 mg via INTRAVENOUS
  Filled 2015-07-02: qty 2

## 2015-07-02 MED ORDER — FUROSEMIDE 10 MG/ML IJ SOLN: 40.0000 mg | Freq: Once | INTRAMUSCULAR | Status: DC

## 2015-07-02 NOTE — ED Notes (Signed)
CRITICAL VALUE ALERT  Critical value received:  Lactic acid 4.9  Date of notification:  07/02/15  Time of notification:  1919  Critical value read back:yes  Nurse who received alert:  Bennetta Laos  MD notified (1st page):  kohut  Time of first page:  1916  MD notified (2nd page):  Time of second page:  Responding MD: Juleen China  Time MD responded: [phone]

## 2015-07-02 NOTE — ED Provider Notes (Signed)
5:39 PM Pt found down at home. Presumed drug overdose. Intubated for airway protection. Transient hypotension which responded to IVF. Noted to be febrile. Elevated lactic acid and repeat higher. Additional IVF ordered. Empiric abx. Needs admission.   Raeford Razor, MD 07/02/15 [phone]

## 2015-07-02 NOTE — ED Notes (Signed)
Family at bedside. Dr. Effie Shy talking with family.

## 2015-07-02 NOTE — ED Notes (Addendum)
Patient intubated by Dr. Effie Shy with 7.5 tube 22 at lip. Bilateral breath sounds noted per Dr. Effie Shy.

## 2015-07-02 NOTE — ED Provider Notes (Addendum)
7:07 PM Sepsis - Repeat Assessment  Performed at:    1900  Vitals     Blood pressure 86/66, pulse 110, temperature 101.8 F (38.8 C), temperature source Core (Comment), resp. rate 22, last menstrual period 06/15/2015, SpO2 100 %.  Heart:     Tachycardic  Lungs:    CTA  Capillary Refill:   <2 sec  Peripheral Pulse:   Radial pulse palpable  Skin:     Pale   BP dropping again. Is more awake though. Has bed in ICU. Will place central line prior to leaving ED. PRN levophed.    Raeford Razor, MD 07/02/15 1909  CENTRAL LINE Performed by: Raeford Razor Consent: The procedure was performed in an emergent situation. Required items: required blood products, implants, devices, and special equipment available Patient identity confirmed: arm band and provided demographic data Time out: Immediately prior to procedure a "time out" was called to verify the correct patient, procedure, equipment, support staff and site/side marked as required. Indications: vascular access Preparation: skin prepped with 2% chlorhexidine Skin prep agent dried: skin prep agent completely dried prior to procedure Sterile barriers: all five maximum sterile barriers used - cap, mask, sterile gown, sterile gloves, and large sterile sheet Hand hygiene: hand hygiene performed prior to central venous catheter insertion  Location details: R femoral  Catheter type: triple lumen Catheter size: 8 Fr Pre-procedure: landmarks identified Ultrasound guidance: yes Successful placement: yes Post-procedure: line sutured and dressing applied Assessment: blood return through all parts, free fluid flow Patient tolerance: Patient tolerated the procedure well with no immediate complications.   Raeford Razor, MD 07/02/15 [phone]

## 2015-07-02 NOTE — ED Notes (Signed)
Mothers number is Early Chars [phone])

## 2015-07-02 NOTE — ED Notes (Signed)
Dr. Juleen China made aware of elevated temp.

## 2015-07-02 NOTE — ED Notes (Signed)
Verbal order given by Dr. Effie Shy. Give another bolus of NS stat. PAtients BP: 86/62 HR: 121.

## 2015-07-02 NOTE — ED Provider Notes (Signed)
CSN: [id number]     Arrival date & time 07/02/15  1147 History  By signing my name below, I, Becky [name], attest that this documentation has been prepared under the direction and in the presence of No att. providers found. Electronically Signed: Murriel [name], ED Scribe. 07/02/2015. 12:04 PM.    Chief Complaint  Patient presents with  . Drug Overdose      Patient is a 25 y.o. female presenting with Overdose. The history is provided by the EMS personnel. The history is limited by the condition of the patient. No language interpreter was used.  Drug Overdose  HPI Comments: Becky [name] is a 25 y.o. female who presents to the Emergency Department for a possible drug overdose. Pt was in ED last night and left AMA after being seen for another drug overdose. Pt is a known heroin user, and presented last night shaking and urinating on herself after overdosing on heroin. She apparently left AGAINST MEDICAL ADVICE, last night. Per EMS her relatives have kept an eye on her since she left the hospital last night, and deny seeing her do any other drugs since leaving the hospital. Pt was found unconscious this morning, and was on the floor when EMS found her. EMS found her with family doing chest compressions. She was breathing, at that time, and they assisted ventilations. They noted that she had a rapid heart rate, and was exhibiting posturing with clonus of the left hand and wrist. She was not responsive or verbal. She was treated with Ativan 2 mg IV, by me, given as a verbal order, via radio, medical control.   Level V Caveat- altered mental status   Past Medical History  Diagnosis Date  . Bronchitis   . Peptic ulcer   . Bipolar 1 disorder (HCC)   . History of substance abuse 06/16/2015  . Hepatitis C antibody test positive 06/17/2015  . Polysubstance abuse    Past Surgical History  Procedure Laterality Date  . Other surgical history      scar tissue removed from right ovary   . Other  surgical history      fallopian tube repair  . Wisdom tooth extraction     Family History  Problem Relation Age of Onset  . Cancer Other   . Drug abuse Brother    Social History  Substance Use Topics  . Smoking status: Current Every Day Smoker -- 1.00 packs/day for 10 years    Types: Cigarettes  . Smokeless tobacco: Never Used  . Alcohol Use: Yes     Comment: occ   OB History    Gravida Para Term Preterm AB TAB SAB Ectopic Multiple Living   Review of Systems  Unable to perform ROS: Mental status change      Allergies  Nsaids and Tylenol  Home Medications   Prior to Admission medications   Medication Sig Start Date End Date Taking? Authorizing Provider  citalopram (CELEXA) 20 MG tablet Take 20 mg by mouth every morning.    Historical Provider, MD  gabapentin (NEURONTIN) 600 MG tablet Take 600 mg by mouth 3 (three) times daily.    Historical Provider, MD  levothyroxine (SYNTHROID, LEVOTHROID) 50 MCG tablet Take 50 mcg by mouth every morning.    Historical Provider, MD  ondansetron (ZOFRAN) 4 MG tablet Take 1 tablet (4 mg total) by mouth every 6 (six) hours as needed for nausea. Patient not taking: Reported  on 07/01/2015 06/17/15   Elliot Cousin, MD  oxyCODONE (OXY IR/ROXICODONE) 5 MG immediate release tablet Take 1 tablet (5 mg total) by mouth every 8 (eight) hours as needed for severe pain. 06/17/15   Elliot Cousin, MD  pantoprazole (PROTONIX) 40 MG tablet Take 1 tablet (40 mg total) by mouth daily. Patient not taking: Reported on 07/01/2015 06/17/15   Elliot Cousin, MD  prenatal vitamin w/FE, FA (PRENATAL 1 + 1) 27-1 MG TABS tablet Take 1 tablet by mouth every morning.    Historical Provider, MD  traZODone (DESYREL) 150 MG tablet Take 150 mg by mouth at bedtime.    Historical Provider, MD   LMP 06/15/2015 Physical Exam  Constitutional: She appears well-developed and well-nourished.  HENT:  Head: Normocephalic and atraumatic.  Tongue piercing is present.  Small amount of saliva in the oropharynx, no foreign body in oropharynx, or glottis.  Eyes: Conjunctivae and EOM are normal. Pupils are equal, round, and reactive to light.  4 mm and reactive to light  Neck: Normal range of motion and phonation normal. Neck supple.  Cardiovascular: Regular rhythm.   Tachycardic IO in right anterior tibula  Capillary refill <2 seconds  Pulmonary/Chest: Effort normal. She exhibits no tenderness.  Tachypnic, breathing on her own, and assisting ventilation with valve bag mask  Abdominal: Soft. She exhibits no distension. There is no tenderness. There is no guarding.  Musculoskeletal: Normal range of motion.  Neurological: She exhibits normal muscle tone. GCS eye subscore is 1. GCS verbal subscore is 1. GCS motor subscore is 1.  obtunded and not responding to painful stimuli.  Skin: Skin is warm and dry.  Psychiatric: She has a normal mood and affect. Her behavior is normal. Judgment and thought content normal.  Nursing note and vitals reviewed.   ED Course  Procedures (including critical care time)  DIAGNOSTIC STUDIES: Oxygen Saturation is 99% on room air, normal by my interpretation.    COORDINATION OF CARE: 12:00 PM Discussed treatment plan with ED staff.   13:30- patient's mother and aunt are here, they state that the patient was arousable and communicative in total about 4:30 this morning. The next time they attempted to wake her up, was around 10:30 AM. At which time they were unable to arouse her. They called someone at the hospital who recommended starting CPR, by their report. They also called EMS, who evaluated and transferred the patient as noted above. Family members do not think that she had additional ingestions of medications, or drugs, since leaving the emergency department AGAINST MEDICAL ADVICE early this morning (0011).  Labs Review Labs Reviewed - No data to display  Imaging Review Dg Chest Port 1 View  07/01/2015  CLINICAL DATA:   25 year old female with unresponsiveness EXAM: PORTABLE CHEST 1 VIEW COMPARISON:  Radiograph dated 02/09/2014 FINDINGS: Single portable view of the chest demonstrates poor inspiratory effort with low lung volumes. There is mild diffuse interstitial prominence which may represent a degree of congestion versus edema. Superimposed pneumonia is not excluded. No focal consolidation, pleural effusion, or pneumothorax. Top-normal cardiac size. No acute osseous pathology. IMPRESSION: Mild central vascular and interstitial prominence may represent a degree of congestion versus edema. Clinical correlation is recommended. Electronically Signed   By: Elgie Collard M.D.   On: 07/01/2015 21:00   I have personally reviewed and evaluated these images and lab results as part of my medical decision-making.  12:01 PM Intubation  INTUBATION Performed by: Mancel Bale, MD  Required items: required blood products, implants, devices,  and special equipment available Patient identity confirmed: provided demographic data and hospital-assigned identification number Time out: Immediately prior to procedure a "time out" was called to verify the correct patient, procedure, equipment, support staff and site/side marked as required.  Indications: Unable to protect airway   Intubation method: Glidescope Laryngoscopy   Preoxygenation: 100% BVM  Sedatives: 20 mg Etomidate Paralytic: 100mg  Succinylcholine  Tube Size: 7.5 cuffed  Post-procedure assessment: chest rise and ETCO2 monitor Breath sounds: equal and absent over the epigastrium Tube secured with: ETT holder Chest x-ray interpreted by radiologist and me.  Chest x-ray findings: endotracheal tube in appropriate position  Patient tolerated the procedure well with no immediate complications.    16:00- she is tolerating the ventilator well. Mental status has not improved. CT imaging of brain noted to be nothing acute found. Vital signs are stable with mild  tachypnea and tachycardia. Patient's blood pressure improved with IV fluid boluses after a transient episode of hypotension.  17:15- Repeat Lactate is higher and now she has a fever. No clear source for infection. Will send blood culture, urine culture and start empiric ABX. Will give IV Toradol for fever, since stated allergy of Tylenol with rash.  CRITICAL CARE Performed by: Flint Melter Total critical care time: 85  minutes Critical care time was exclusive of separately billable procedures and treating other patients. Critical care was necessary to treat or prevent imminent or life-threatening deterioration. Critical care was time spent personally by me on the following activities: development of treatment plan with patient and/or surrogate as well as nursing, discussions with consultants, evaluation of patient's response to treatment, examination of patient, obtaining history from patient or surrogate, ordering and performing treatments and interventions, ordering and review of laboratory studies, ordering and review of radiographic studies, pulse oximetry and re-evaluation of patient's condition.  MDM   Final diagnoses:  Altered mental status, unspecified altered mental status type  Polysubstance abuse  AKI (acute kidney injury) (HCC)  Fever, unspecified fever cause    Altered mental status, with polysubstance abuse, and inability to protect airway. No sign of overt infection, bleeding, acute CNS abnormality or reversible toxidrome. Mild incidental hyperkalemia has been treated with IV calcium gluconate. Mild elevation of lactate, is likely secondary to medication overdose, and not sepsis. Will continue IV fluids, and cycle lactate, for trending. At 1600 hrs., no ICU. Is currently available. Patient does not require aggressive interventions at this time, therefore, does not meet criteria for transfer, to a higher level of care. I suspect that this is a reversible process, which will  spontaneously improve in the next 12-24 hours.  Late entry- 17:20- now with fever. Source unclear, empiric ABX started after cultures   Nursing Notes Reviewed/ Care Coordinated, and agree without changes. Applicable Imaging Reviewed.  Interpretation of Laboratory Data incorporated into ED treatment  Plan:- Observe in ED, until ICU bed, opens   I personally performed the services described in this documentation, which was scribed in my presence. The recorded information has been reviewed and is accurate.     Mancel Bale, MD 07/04/15 [id number]

## 2015-07-02 NOTE — ED Notes (Addendum)
Family request no visitors. Security and registration made aware.

## 2015-07-02 NOTE — ED Notes (Signed)
Critical Result Lactic Acid of 5.9 called from [place] in Lab. Dr. Juleen China made aware.

## 2015-07-02 NOTE — ED Notes (Signed)
Patient BP 82/62. Dr. Effie Shy made aware. VO to give Bolus of NS. Then patient to CT. Verbal orders carried out.

## 2015-07-02 NOTE — ED Notes (Signed)
Pt refusing additional monitoring or treatment.  Explained risk to patient of leaving AMA. Pt verbalized awareness of potential negative outcomes.  AMA form signed.

## 2015-07-02 NOTE — ED Notes (Signed)
MD at bedside. 

## 2015-07-02 NOTE — H&P (Signed)
[facility] History and Physical  Becky [name] VHQ:[id number] DOB: [birth year] DOA: 07/02/2015  Referring physician: Dr Juleen China - [facility] PCP: No PCP Per Patient   Chief Complaint: Drug overdose  HPI: Becky [name] is a 25 y.o. female  Level 5 caveat: Patient presenting obtunded secondary to drug overdose. History provided by EDP. No family available at time of examination. The patella patient presented to the ED on the day prior additionally with drug overdose complaints but left AMA. Since that time patient has been closely monitored by family members. States that they have not seen her do any additional drugs since that time. However, patient was found unresponsive this morning on the floor at her home. At time of EMS arrival family members were doing compressions on the patient. Patient maintains respirations on her own.. EMS noted a rapid heart rate and posturing with clonus of the left hand and wrist. Patient was given 2 mg of Ativan en route to independent hospital.  In the ED patient decompensated and was intubated.   Review of Systems:  Unable to obtain future review of systems due to patient's mental status.  Past Medical History  Diagnosis Date  . Bronchitis   . Peptic ulcer   . Bipolar 1 disorder (HCC)   . History of substance abuse 06/16/2015  . Hepatitis C antibody test positive 06/17/2015  . Polysubstance abuse    Past Surgical History  Procedure Laterality Date  . Other surgical history      scar tissue removed from right ovary   . Other surgical history      fallopian tube repair  . Wisdom tooth extraction     Social History:  reports that she has been smoking Cigarettes.  She has a 10 pack-year smoking history. She has never used smokeless tobacco. She reports that she drinks alcohol. She reports that she uses illicit drugs (Marijuana, Methamphetamines, MDMA (Ecstacy), Oxycodone, and Benzodiazepines).  Allergies  Allergen Reactions  . Nsaids Other (See  Comments)    Peptic ulcers   . Tylenol [Acetaminophen] Itching and Rash    Family History  Problem Relation Age of Onset  . Cancer Other   . Drug abuse Brother      Prior to Admission medications   Medication Sig Start Date End Date Taking? Authorizing Provider  citalopram (CELEXA) 20 MG tablet Take 20 mg by mouth every morning.   Yes Historical Provider, MD  gabapentin (NEURONTIN) 600 MG tablet Take 600 mg by mouth 3 (three) times daily.   Yes Historical Provider, MD  levothyroxine (SYNTHROID, LEVOTHROID) 50 MCG tablet Take 50 mcg by mouth every morning.   Yes Historical Provider, MD  oxyCODONE (OXY IR/ROXICODONE) 5 MG immediate release tablet Take 1 tablet (5 mg total) by mouth every 8 (eight) hours as needed for severe pain. 06/17/15  Yes Elliot Cousin, MD  prenatal vitamin w/FE, FA (PRENATAL 1 + 1) 27-1 MG TABS tablet Take 1 tablet by mouth every morning.   Yes Historical Provider, MD  traZODone (DESYREL) 150 MG tablet Take 150 mg by mouth at bedtime.   Yes Historical Provider, MD  ondansetron (ZOFRAN) 4 MG tablet Take 1 tablet (4 mg total) by mouth every 6 (six) hours as needed for nausea. Patient not taking: Reported on 07/01/2015 06/17/15   Elliot Cousin, MD  pantoprazole (PROTONIX) 40 MG tablet Take 1 tablet (40 mg total) by mouth daily. Patient not taking: Reported on 07/01/2015 06/17/15   Elliot Cousin, MD   Physical Exam: Ceasar Mons Vitals:  07/02/15 1900 07/02/15 1915 07/02/15 2000 07/02/15 2035  BP: 84/63  83/58   Pulse: 112  106   Temp: 101.7 F (38.7 C) 101.5 F (38.6 C) 101.1 F (38.4 C) 98.4 F (36.9 C)  TempSrc:    Axillary  Resp: 22 16 22    Height:    5\' 4"  (1.626 m)  Weight:    81.9 kg (180 lb 8.9 oz)  SpO2: 100%  100%     Wt Readings from Last 3 Encounters:  07/02/15 81.9 kg (180 lb 8.9 oz)  07/01/15 74.844 kg (165 lb)  06/16/15 74.844 kg (165 lb)    General:  Intubated and resting comfortably Eyes: Pupils dilated and sluggish burt reactive and sclera  injected  ENT: dry mm Neck:  no LAD, masses or thyromegaly Cardiovascular:  RRR, II?VI systolic murmur . Trace LE edema.  Respiratory:  Diffuse ronchi. Intubated Abdomen:  soft, ntnd Skin: no rash or induration seen on limited exam, various areas of minor abrasions Musculoskeletal, psych, neuro: UNable to evaluate due to obtunded state. Flaccid          Labs on Admission:  Basic Metabolic Panel:  Recent Labs Lab 07/01/15 2145 07/02/15 1232  NA 138 139  K 4.1 5.6*  CL 104 105  CO2 24 23  GLUCOSE 85 125*  BUN 8 10  CREATININE 0.90 1.65*  CALCIUM 8.5* 7.7*   Liver Function Tests:  Recent Labs Lab 07/01/15 2145 07/02/15 1232  AST 69* 70*  ALT 283* 238*  ALKPHOS 99 101  BILITOT 0.2* 0.5  PROT 6.7 6.5  ALBUMIN 3.8 3.4*   No results for input(s): LIPASE, AMYLASE in the last 168 hours.  Recent Labs Lab 07/02/15 1232  AMMONIA 34   CBC:  Recent Labs Lab 07/01/15 2145 07/02/15 1232  WBC 13.3* 16.0*  NEUTROABS 9.6* 13.1*  HGB 12.3 11.8*  HCT 36.9 36.8  MCV 89.8 93.2  PLT 269 291   Cardiac Enzymes: No results for input(s): CKTOTAL, CKMB, CKMBINDEX, TROPONINI in the last 168 hours.  BNP (last 3 results) No results for input(s): BNP in the last 8760 hours.  ProBNP (last 3 results) No results for input(s): PROBNP in the last 8760 hours.   CREATININE: 1.65 mg/dL ABNORMAL (16/10/96 0454) Estimated creatinine clearance - 53.5 mL/min  CBG: No results for input(s): GLUCAP in the last 168 hours.  Radiological Exams on Admission: Ct Head Wo Contrast  07/02/2015  CLINICAL DATA:  Altered mental status EXAM: CT HEAD WITHOUT CONTRAST TECHNIQUE: Contiguous axial images were obtained from the base of the skull through the vertex without intravenous contrast. COMPARISON:  None. FINDINGS: Skull and Sinuses:Negative for fracture. Nasal cavity and nasopharynx fluid in the setting of intubation. Visualized orbits: Negative. Brain: Unremarkable. No evidence of acute  infarction, hemorrhage, hydrocephalus, or mass lesion/mass effect. IMPRESSION: Negative head CT. Electronically Signed   By: Marnee Spring M.D.   On: 07/02/2015 14:07   Dg Chest Port 1 View  07/02/2015  CLINICAL DATA:  Overdose.  Status post intubation. EXAM: PORTABLE CHEST 1 VIEW COMPARISON:  07/01/2015 FINDINGS: Endotracheal tube tip projects 17 mm above the carina, well positioned. Oral/nasogastric tube passes below diaphragm. Tip is directed into the distal stomach. Hazy opacity projects over the left mid lung. This in part may be due to the supine rotated technique and overlying soft tissues. There is mild hazy opacity in the right perihilar region. Mild pulmonary edema is suspected. Cardiac silhouette is normal in size. IMPRESSION: 1. Endotracheal tube and nasal/orogastric tube  are well positioned. 2. Mild pulmonary edema. Electronically Signed   By: David  OrmAmie [place] On: 07/02/2015 12:30   Dg Chest Port 1 View  07/01/2015  CLINICAL DATA:  25 year old female with unresponsiveness EXAM: PORTABLE CHEST 1 VIEW COMPARISON:  Radiograph dated 02/09/2014 FINDINGS: Single portable view of the chest demonstrates poor inspiratory effort with low lung volumes. There is mild diffuse interstitial prominence which may represent a degree of congestion versus edema. Superimposed pneumonia is not excluded. No focal consolidation, pleural effusion, or pneumothorax. Top-normal cardiac size. No acute osseous pathology. IMPRESSION: Mild central vascular and interstitial prominence may represent a degree of congestion versus edema. Clinical correlation is recommended. Electronically Signed   By: Elgie Collard M.D.   On: 07/01/2015 21:00      Assessment/Plan Active Problems:   Transaminitis   History of substance abuse   Overdose   Lactic acidosis   Acute respiratory failure (HCC)   Sepsis (HCC)   Acute respiratory failure: Likely secondary to narcotic overdose and possible aspiration. Initial ABG  showing pH 7.24, PCO2 50, PO2 84, bicarbonate 19.5, lactic acid 4.8 (initially trended up to 5.9  Prior to intubation), WBC 16, creatinine 1.65, AST 70, ALT 238. Repeat ABG and lactic acid (4.9) show improvement after intubation. Sepsis orders initiated  - ICU - continue vent - wean in am - continue broad spectrum ABX - trend lactic acid - IVF - f/u BCX, UCX - ABG in am - CXR in am - CIWA - Levophed if needed for MAP <65  Drug overdose: Short 2 day history now of drug overdose. Patient left hospital AMA 1 day ago and was found unresponsive on day of admission. Patient's UDS positive for benzodiazepines, opiates, cocaine, marijuana. Patient went into acute respiratory failure and was intubated. - Treatment measures as above. Anticipate patient's return to baseline is only a matter of time - Sitter - Psych eval in a.m. if awake and alert  Possible seizure: witnessed clonus of LUE by family just prior to EMS arrival. Ativan given w/ en route w/ no further episodes.  - Ativan PRN - +/- EEG in am or if seizes again  GERD: - IV PPI  Hypothyroid: - hold synthroid until taking PO  Depression:  - Psych consult when awake - hold home meds until taking po    Code Status: FULL  DVT Prophylaxis: Hep Family Communication: none available.  Disposition Plan: Pending Improvement    MERRELL, DAVID Shela Commons, MD Family Medicine [facility] [email or web address] Password [email or web address]

## 2015-07-02 NOTE — ED Notes (Signed)
Brought in for possible heroin overdose. IO present. Bagging on arrival.

## 2015-07-03 ENCOUNTER — Inpatient Hospital Stay (HOSPITAL_COMMUNITY): Payer: Self-pay

## 2015-07-03 ENCOUNTER — Encounter (HOSPITAL_COMMUNITY): Payer: Self-pay | Admitting: *Deleted

## 2015-07-03 DIAGNOSIS — Z87898 Personal history of other specified conditions: Secondary | ICD-10-CM

## 2015-07-03 DIAGNOSIS — E872 Acidosis: Secondary | ICD-10-CM

## 2015-07-03 DIAGNOSIS — N179 Acute kidney failure, unspecified: Secondary | ICD-10-CM

## 2015-07-03 LAB — COMPREHENSIVE METABOLIC PANEL
ALT: 157 U/L — AB (ref 14–54)
AST: 76 U/L — AB (ref 15–41)
Albumin: 2.5 g/dL — ABNORMAL LOW (ref 3.5–5.0)
Alkaline Phosphatase: 76 U/L (ref 38–126)
Anion gap: 9 (ref 5–15)
BILIRUBIN TOTAL: 0.6 mg/dL (ref 0.3–1.2)
BUN: 12 mg/dL (ref 6–20)
CHLORIDE: 112 mmol/L — AB (ref 101–111)
CO2: 20 mmol/L — ABNORMAL LOW (ref 22–32)
CREATININE: 1.13 mg/dL — AB (ref 0.44–1.00)
Calcium: 7.3 mg/dL — ABNORMAL LOW (ref 8.9–10.3)
GFR calc Af Amer: >60 mL/min (ref >60)
GLUCOSE: 118 mg/dL — AB (ref 65–99)
Potassium: 4 mmol/L (ref 3.5–5.1)
Sodium: 141 mmol/L (ref 135–145)
Total Protein: 4.9 g/dL — ABNORMAL LOW (ref 6.5–8.1)

## 2015-07-03 LAB — BLOOD GAS, ARTERIAL
ACID-BASE EXCESS: 5.2 mmol/L — AB (ref 0.0–2.0)
Bicarbonate: 20.7 mEq/L (ref 20.0–24.0)
DRAWN BY: 22223
FIO2: 40
MECHVT: 440 mL
O2 Saturation: 96.9 %
PATIENT TEMPERATURE: 37
PCO2 ART: 29.3 mmHg — AB (ref 35.0–45.0)
PEEP/CPAP: 5 cmH2O
PH ART: 7.417 (ref 7.350–7.450)
PO2 ART: 93.3 mmHg (ref 80.0–100.0)
RATE: 22 resp/min
TCO2: 16.9 mmol/L (ref 0–100)

## 2015-07-03 LAB — CBC
HCT: 37.5 % (ref 36.0–46.0)
Hemoglobin: 12.4 g/dL (ref 12.0–15.0)
MCH: 29.6 pg (ref 26.0–34.0)
MCHC: 33.1 g/dL (ref 30.0–36.0)
MCV: 89.5 fL (ref 78.0–100.0)
PLATELETS: 369 10*3/uL (ref 150–400)
RBC: 4.19 MIL/uL (ref 3.87–5.11)
RDW: 14.1 % (ref 11.5–15.5)
WBC: 21.5 10*3/uL — ABNORMAL HIGH (ref 4.0–10.5)

## 2015-07-03 LAB — LACTIC ACID, PLASMA
LACTIC ACID, VENOUS: 1.7 mmol/L (ref 0.5–2.0)
Lactic Acid, Venous: 1.6 mmol/L (ref 0.5–2.0)

## 2015-07-03 LAB — MRSA PCR SCREENING: MRSA BY PCR: POSITIVE — AB

## 2015-07-03 LAB — TSH: TSH: 0.191 u[IU]/mL — ABNORMAL LOW (ref 0.350–4.500)

## 2015-07-03 MED ORDER — VANCOMYCIN HCL IN DEXTROSE 1-5 GM/200ML-% IV SOLN
1000.0000 mg | Freq: Two times a day (BID) | INTRAVENOUS | Status: DC
  Administered 2015-07-03 – 2015-07-05 (×4): 1000 mg via INTRAVENOUS
  Filled 2015-07-03 (×4): qty 200

## 2015-07-03 MED ORDER — MUPIROCIN 2 % EX OINT
1.0000 "application " | TOPICAL_OINTMENT | Freq: Two times a day (BID) | CUTANEOUS | Status: DC
  Administered 2015-07-03 – 2015-07-05 (×5): 1 via NASAL
  Filled 2015-07-03 (×2): qty 22

## 2015-07-03 MED ORDER — DEXMEDETOMIDINE HCL IN NACL 200 MCG/50ML IV SOLN
INTRAVENOUS | Status: AC
  Filled 2015-07-03: qty 100

## 2015-07-03 MED ORDER — CHLORHEXIDINE GLUCONATE CLOTH 2 % EX PADS
6.0000 | MEDICATED_PAD | Freq: Every day | CUTANEOUS | Status: DC
  Administered 2015-07-03 – 2015-07-05 (×3): 6 via TOPICAL

## 2015-07-03 MED ORDER — METHYLPREDNISOLONE SODIUM SUCC 40 MG IJ SOLR
40.0000 mg | Freq: Two times a day (BID) | INTRAMUSCULAR | Status: DC
  Administered 2015-07-03 – 2015-07-05 (×5): 40 mg via INTRAVENOUS
  Filled 2015-07-03 (×5): qty 1

## 2015-07-03 NOTE — Progress Notes (Addendum)
Pharmacy Antibiotic Note  Becky [name] is a 25 y.o. female admitted on 07/02/2015 with pneumonia.  Pharmacy has been consulted for vanc and zosyn dosing.  Plan: Zosyn 3.375 gm IV q8 hours Vanc 1gm IV X 1 then 1000 mg IV q12 hours  Height:  (162.6 cm) Weight: 188 lb 0.8 oz (85.3 kg) IBW/kg (Calculated) : 54.7  Temp (24hrs), Avg:100.6 F (38.1 C), Min:98.1 F (36.7 C), Max:102.2 F (39 C)   Recent Labs Lab 07/01/15 2145 07/02/15 1232 07/02/15 1621 07/02/15 1847 07/03/15 0415  WBC 13.3* 16.0*  --   --  21.5*  CREATININE 0.90 1.65*  --   --  1.13*  LATICACIDVEN  --  4.8* 5.9* 4.9*  --     Estimated Creatinine Clearance: 79.7 mL/min (by C-G formula based on Cr of 1.13).    Allergies  Allergen Reactions  . Nsaids Other (See Comments)    Peptic ulcers   . Tylenol [Acetaminophen] Itching and Rash    Antimicrobials this admission: Vancomycin  2/16  >>  Zosyn   2/16 >>    Thank you for allowing pharmacy to be a part of this patient's care.  Talbert Cage [place] 07/03/2015 8:00 AM

## 2015-07-03 NOTE — Progress Notes (Signed)
[facility] PROGRESS NOTE  Becky [name] EAV:[id number] DOB: 1990/08/08    PCP:   No PCP Per Patient   HPI: Becky [name] is an 25 y.o. female with hx of substance abuse, seen in ER, left AMA, returned via EMS with possible cardiac arrest, intubated subsequently and admitted to the ICU.  UDS showed cocaine, THC, Benzo, and Opiates.  She was seen by Dr Juanetta Gosling, and he added steroids.   She is maintaining hemodynamic stability, agitated, and is requiring sedations. Fiance at bedside.   Rewiew of Systems: Unable.    Past Medical History  Diagnosis Date  . Bronchitis   . Peptic ulcer   . Bipolar 1 disorder (HCC)   . History of substance abuse 06/16/2015  . Hepatitis C antibody test positive 06/17/2015  . Polysubstance abuse     Past Surgical History  Procedure Laterality Date  . Other surgical history      scar tissue removed from right ovary   . Other surgical history      fallopian tube repair  . Wisdom tooth extraction      Medications:  HOME MEDS: Prior to Admission medications   Medication Sig Start Date End Date Taking? Authorizing Provider  citalopram (CELEXA) 20 MG tablet Take 20 mg by mouth every morning.   Yes Historical Provider, MD  gabapentin (NEURONTIN) 600 MG tablet Take 600 mg by mouth 3 (three) times daily.   Yes Historical Provider, MD  levothyroxine (SYNTHROID, LEVOTHROID) 50 MCG tablet Take 50 mcg by mouth every morning.   Yes Historical Provider, MD  oxyCODONE (OXY IR/ROXICODONE) 5 MG immediate release tablet Take 1 tablet (5 mg total) by mouth every 8 (eight) hours as needed for severe pain. 06/17/15  Yes Elliot Cousin, MD  prenatal vitamin w/FE, FA (PRENATAL 1 + 1) 27-1 MG TABS tablet Take 1 tablet by mouth every morning.   Yes Historical Provider, MD  traZODone (DESYREL) 150 MG tablet Take 150 mg by mouth at bedtime.   Yes Historical Provider, MD  ondansetron (ZOFRAN) 4 MG tablet Take 1 tablet (4 mg total) by mouth every 6 (six) hours as needed  for nausea. Patient not taking: Reported on 07/01/2015 06/17/15   Elliot Cousin, MD  pantoprazole (PROTONIX) 40 MG tablet Take 1 tablet (40 mg total) by mouth daily. Patient not taking: Reported on 07/01/2015 06/17/15   Elliot Cousin, MD     Allergies:  Allergies  Allergen Reactions  . Nsaids Other (See Comments)    Peptic ulcers   . Tylenol [Acetaminophen] Itching and Rash    Social History:   reports that she has been smoking Cigarettes.  She has a 10 pack-year smoking history. She has never used smokeless tobacco. She reports that she drinks alcohol. She reports that she uses illicit drugs (Marijuana, Methamphetamines, MDMA (Ecstacy), Oxycodone, and Benzodiazepines).  Family History: Family History  Problem Relation Age of Onset  . Cancer Other   . Drug abuse Brother      Physical Exam: Filed Vitals:   07/03/15 1615 07/03/15 1630 07/03/15 1645 07/03/15 1700  BP: 122/84 122/83 121/84 119/71  Pulse: 96 97 95 95  Temp:      TempSrc:      Resp: Height:      Weight:      SpO2: 97% 94% 97% 96%   Blood pressure 119/71, pulse 95, temperature 99.5 F (37.5 C), temperature source Axillary, resp. rate 22, height  (1.626 m), weight 85.3 kg (188 lb  0.8 oz), last menstrual period 06/15/2015, SpO2 96 %.  GEN:  Sedated. Intubated.  PSYCH:  does not appear anxious or depressed; affect is appropriate. HEENT: Mucous membranes pink and anicteric; PERRLA; EOM intact; no cervical lymphadenopathy nor thyromegaly or carotid bruit; no JVD; There were no stridor. Neck is very supple. Breasts:: Not examined CHEST WALL: No tenderness CHEST: mechanical ventilation HEART: Regular rate and rhythm.  There are no murmur, rub, or gallops.   BACK: No kyphosis or scoliosis; no CVA tenderness ABDOMEN: soft and non-tender; no masses, no organomegaly, normal abdominal bowel sounds; no pannus; no intertriginous candida. There is no rebound and no distention. Rectal Exam: Not  done EXTREMITIES: No bone or joint deformity; age-appropriate arthropathy of the hands and knees; no edema; no ulcerations.  There is no calf tenderness. Genitalia: not examined PULSES: 2+ and symmetric SKIN: Normal hydration no rash or ulceration CNS: she moves all 4.    Labs on Admission:  Basic Metabolic Panel:  Recent Labs Lab 07/01/15 2145 07/02/15 1232 07/03/15 0415  NA 138 139 141  K 4.1 5.6* 4.0  CL 104 105 112*  CO2 24 23 20*  GLUCOSE 85 125* 118*  BUN 8 10 12   CREATININE 0.90 1.65* 1.13*  CALCIUM 8.5* 7.7* 7.3*   Liver Function Tests:  Recent Labs Lab 07/01/15 2145 07/02/15 1232 07/03/15 0415  AST 69* 70* 76*  ALT 283* 238* 157*  ALKPHOS 99 101 76  BILITOT 0.2* 0.5 0.6  PROT 6.7 6.5 4.9*  ALBUMIN 3.8 3.4* 2.5*   No results for input(s): LIPASE, AMYLASE in the last 168 hours.  Recent Labs Lab 07/02/15 1232  AMMONIA 34   CBC:  Recent Labs Lab 07/01/15 2145 07/02/15 1232 07/03/15 0415  WBC 13.3* 16.0* 21.5*  NEUTROABS 9.6* 13.1*  --   HGB 12.3 11.8* 12.4  HCT 36.9 36.8 37.5  MCV 89.8 93.2 89.5  PLT 269 291 369   Radiological Exams on Admission: Ct Head Wo Contrast  07/02/2015  CLINICAL DATA:  Altered mental status EXAM: CT HEAD WITHOUT CONTRAST TECHNIQUE: Contiguous axial images were obtained from the base of the skull through the vertex without intravenous contrast. COMPARISON:  None. FINDINGS: Skull and Sinuses:Negative for fracture. Nasal cavity and nasopharynx fluid in the setting of intubation. Visualized orbits: Negative. Brain: Unremarkable. No evidence of acute infarction, hemorrhage, hydrocephalus, or mass lesion/mass effect. IMPRESSION: Negative head CT. Electronically Signed   By: Marnee Spring M.D.   On: 07/02/2015 14:07   Portable Chest Xray  07/03/2015  CLINICAL DATA:  Ventilated patient, acute respiratory failure, sepsis, overdose, current smoker. EXAM: PORTABLE CHEST 1 VIEW COMPARISON:  Portable chest x-ray of July 02, 2015  FINDINGS: The lungs are mildly hypoinflated. Infiltrate in the left mid lung has improved. The costophrenic angles are sharp. Hazy density in the right infrahilar region is noted. The heart is mildly enlarged. The central pulmonary vascularity is prominent but more distinct today. The endotracheal tube tip lies approximately 3 cm above the carina. The esophagogastric tube tip projects below the inferior margin of the image. IMPRESSION: Improving infiltrate or atelectasis in the left perihilar region. Persistent mild cardiomegaly with improving central pulmonary vascular congestion. Subtle right infrahilar interstitial density more conspicuous today may reflect atelectasis or less likely pneumonia. The support tubes are in reasonable position. Electronically Signed   By: David  [place] M.D.   On: 07/03/2015 07:29   Dg Chest Port 1 View  07/02/2015  CLINICAL DATA:  Overdose.  Status post intubation. EXAM: PORTABLE  CHEST 1 VIEW COMPARISON:  07/01/2015 FINDINGS: Endotracheal tube tip projects 17 mm above the carina, well positioned. Oral/nasogastric tube passes below diaphragm. Tip is directed into the distal stomach. Hazy opacity projects over the left mid lung. This in part may be due to the supine rotated technique and overlying soft tissues. There is mild hazy opacity in the right perihilar region. Mild pulmonary edema is suspected. Cardiac silhouette is normal in size. IMPRESSION: 1. Endotracheal tube and nasal/orogastric tube are well positioned. 2. Mild pulmonary edema. Electronically Signed   By: Amie Portland M.D.   On: 07/02/2015 12:30   Dg Chest Port 1 View  07/01/2015  CLINICAL DATA:  25 year old female with unresponsiveness EXAM: PORTABLE CHEST 1 VIEW COMPARISON:  Radiograph dated 02/09/2014 FINDINGS: Single portable view of the chest demonstrates poor inspiratory effort with low lung volumes. There is mild diffuse interstitial prominence which may represent a degree of congestion versus edema.  Superimposed pneumonia is not excluded. No focal consolidation, pleural effusion, or pneumothorax. Top-normal cardiac size. No acute osseous pathology. IMPRESSION: Mild central vascular and interstitial prominence may represent a degree of congestion versus edema. Clinical correlation is recommended. Electronically Signed   By: Elgie Collard M.D.   On: 07/01/2015 21:00   Assessment/Plan Present on Admission:  . Overdose . Transaminitis  PLAN:  Polysubstance abuse with respiratory failure, and possible cardiac arrest:   The NEJM this month has an article shown that  Fentanyl is being laced in other substances as the cost of Fentanyl in street production is quite low compared to other narcotics and heroine ( 3500.00 per Kg vs 80,000.00 per Kg).  It is possible that there is Fentanyl in her drugs as well.   Will continue with Precedex and intubation tonight.  Suspect we will be able to extubate her in the am.   Possible Seizure:  Cocaine induced likely if true seizure.  No ACD indicated.   GERD    IV PPI.  Hypothyroidism:  Check TSH.  If needed, will give IV synthroid at half oral dose.   Other plans as per orders. Code Status: FULL Unk Lightning, MD.  FACP [facility] Pager [phone] 7pm to 7am.  07/03/2015, 5:08 PM

## 2015-07-03 NOTE — H&P (Signed)
Consult requested by: [facility] Consult requested for respiratory failure:  HPI: This is a 25 year old who was brought to the emergency department obtunded. She had apparently been seen the day prior to admission with a drug overdose but left AMA. She has been closely monitored by her family and they did not see her do any further drugs but she was found unresponsive on the floor of her home on the day of admission. They could not definitely obtain a pulse of a were doing chest compressions. She had rapid heartbeat when EMS arrived was brought to the emergency department and was intubated there. She does not have any known lung disease but has a history of polysubstance abuse.  Past Medical History  Diagnosis Date  . Bronchitis   . Peptic ulcer   . Bipolar 1 disorder (HCC)   . History of substance abuse 06/16/2015  . Hepatitis C antibody test positive 06/17/2015  . Polysubstance abuse      Family History  Problem Relation Age of Onset  . Cancer Other   . Drug abuse Brother      Social History   Social History  . Marital Status: Legally Separated    Spouse Name: N/A  . Number of Children: N/A  . Years of Education: N/A   Social History Main Topics  . Smoking status: Current Every Day Smoker -- 1.00 packs/day for 10 years    Types: Cigarettes  . Smokeless tobacco: Never Used  . Alcohol Use: Yes     Comment: occ  . Drug Use: Yes    Special: Marijuana, Methamphetamines, MDMA (Ecstacy), Oxycodone, Benzodiazepines     Comment: 14 months ago heroin  . Sexual Activity: Yes    Birth Control/ Protection: None   Other Topics Concern  . None   Social History Narrative     ROS: Unobtainable    Objective: Vital signs in last 24 hours: Temp:  [98.1 F (36.7 C)-102.2 F (39 C)] 99.5 F (37.5 C) (02/17 0700) Pulse Rate:  [96-135] 100 (02/17 0600) Resp:  [9-22] 22 (02/17 0600) BP: (54-131)/(31-95) 125/76 mmHg (02/17 0600) SpO2:  [90 %-100 %] 99 % (02/17 0724) FiO2  (%):  [40 %-90 %] 40 % (02/17 0725) Weight:  [81.9 kg (180 lb 8.9 oz)-85.3 kg (188 lb 0.8 oz)] 85.3 kg (188 lb 0.8 oz) (02/17 0500) Weight change:     Intake/Output from previous day: 02/16 0701 - 02/17 0700 In: 1725.4 [I.V.:1665.4; NG/GT:60] Out: 1150 [Urine:850; Emesis/NG output:300]  PHYSICAL EXAM She is intubated and on mechanical ventilation. Her pupils do react. Mucous membranes are moist. Her neck is supple without masses. Her chest shows diminished breath sounds and some rhonchi. Her heart is regular without gallop. Her abdomen is soft without masses. Extremities showed no edema. Central nervous system examination not really able to be evaluated  Lab Results: Basic Metabolic Panel:  Recent Labs  09/60/45 1232 07/03/15 0415  NA 139 141  K 5.6* 4.0  CL 105 112*  CO2 23 20*  GLUCOSE 125* 118*  BUN 10 12  CREATININE 1.65* 1.13*  CALCIUM 7.7* 7.3*   Liver Function Tests:  Recent Labs  07/02/15 1232 07/03/15 0415  AST 70* 76*  ALT 238* 157*  ALKPHOS 101 76  BILITOT 0.5 0.6  PROT 6.5 4.9*  ALBUMIN 3.4* 2.5*   No results for input(s): LIPASE, AMYLASE in the last 72 hours.  Recent Labs  07/02/15 1232  AMMONIA 34   CBC:  Recent Labs  07/01/15 2145 07/02/15  1232 07/03/15 0415  WBC 13.3* 16.0* 21.5*  NEUTROABS 9.6* 13.1*  --   HGB 12.3 11.8* 12.4  HCT 36.9 36.8 37.5  MCV 89.8 93.2 89.5  PLT 269 291 369   Cardiac Enzymes: No results for input(s): CKTOTAL, CKMB, CKMBINDEX, TROPONINI in the last 72 hours. BNP: No results for input(s): PROBNP in the last 72 hours. D-Dimer: No results for input(s): DDIMER in the last 72 hours. CBG: No results for input(s): GLUCAP in the last 72 hours. Hemoglobin A1C: No results for input(s): HGBA1C in the last 72 hours. Fasting Lipid Panel: No results for input(s): CHOL, HDL, LDLCALC, TRIG, CHOLHDL, LDLDIRECT in the last 72 hours. Thyroid Function Tests: No results for input(s): TSH, T4TOTAL, FREET4, T3FREE,  THYROIDAB in the last 72 hours. Anemia Panel: No results for input(s): VITAMINB12, FOLATE, FERRITIN, TIBC, IRON, RETICCTPCT in the last 72 hours. Coagulation:  Recent Labs  07/02/15 2137  LABPROT 15.7*  INR 1.23   Urine Drug Screen: Drugs of Abuse     Component Value Date/Time   LABOPIA POSITIVE* 07/02/2015 1247   COCAINSCRNUR POSITIVE* 07/02/2015 1247   LABBENZ POSITIVE* 07/02/2015 1247   AMPHETMU NONE DETECTED 07/02/2015 1247   THCU POSITIVE* 07/02/2015 1247   LABBARB NONE DETECTED 07/02/2015 1247    Alcohol Level:  Recent Labs  07/01/15 2145 07/02/15 1232  ETH <5 <5   Urinalysis:  Recent Labs  07/01/15 2315 07/02/15 1245  COLORURINE YELLOW YELLOW  LABSPEC >1.030* 1.025  PHURINE 6.0 5.5  GLUCOSEU NEGATIVE NEGATIVE  HGBUR NEGATIVE TRACE*  BILIRUBINUR NEGATIVE NEGATIVE  KETONESUR NEGATIVE NEGATIVE  PROTEINUR NEGATIVE 30*  NITRITE NEGATIVE NEGATIVE  LEUKOCYTESUR NEGATIVE NEGATIVE   Misc. Labs:   ABGS:  Recent Labs  07/03/15 0356  PHART 7.417  PO2ART 93.3  TCO2 16.9  HCO3 20.7     MICROBIOLOGY: Recent Results (from the past 240 hour(s))  Culture, blood (routine x 2)     Status: None (Preliminary result)   Collection Time: 07/02/15 12:30 PM  Result Value Ref Range Status   Specimen Description BLOOD RIGHT ARM  Final   Special Requests BOTTLES DRAWN AEROBIC AND ANAEROBIC 6CC EACH  Final   Culture NO GROWTH < 12 HOURS  Final   Report Status PENDING  Incomplete  Culture, blood (routine x 2)     Status: None (Preliminary result)   Collection Time: 07/02/15  5:45 PM  Result Value Ref Range Status   Specimen Description BLOOD LEFT HAND  Final   Special Requests BOTTLES DRAWN AEROBIC ONLY 4CC  Final   Culture NO GROWTH < 12 HOURS  Final   Report Status PENDING  Incomplete  MRSA PCR Screening     Status: Abnormal   Collection Time: 07/02/15  8:40 PM  Result Value Ref Range Status   MRSA by PCR POSITIVE (A) NEGATIVE Final    Comment:        The  GeneXpert MRSA Assay (FDA approved for NASAL specimens only), is one component of a comprehensive MRSA colonization surveillance program. It is not intended to diagnose MRSA infection nor to guide or monitor treatment for MRSA infections. CRITICAL RESULT CALLED TO, READ BACK BY AND VERIFIED WITH: STOPFUL,A AT 0539 ON 07/03/2015 BY WOODS, M     Studies/Results: Ct Head Wo Contrast  07/02/2015  CLINICAL DATA:  Altered mental status EXAM: CT HEAD WITHOUT CONTRAST TECHNIQUE: Contiguous axial images were obtained from the base of the skull through the vertex without intravenous contrast. COMPARISON:  None. FINDINGS: Skull and Sinuses:Negative  for fracture. Nasal cavity and nasopharynx fluid in the setting of intubation. Visualized orbits: Negative. Brain: Unremarkable. No evidence of acute infarction, hemorrhage, hydrocephalus, or mass lesion/mass effect. IMPRESSION: Negative head CT. Electronically Signed   By: Marnee Spring M.D.   On: 07/02/2015 14:07   Portable Chest Xray  07/03/2015  CLINICAL DATA:  Ventilated patient, acute respiratory failure, sepsis, overdose, current smoker. EXAM: PORTABLE CHEST 1 VIEW COMPARISON:  Portable chest x-ray of July 02, 2015 FINDINGS: The lungs are mildly hypoinflated. Infiltrate in the left mid lung has improved. The costophrenic angles are sharp. Hazy density in the right infrahilar region is noted. The heart is mildly enlarged. The central pulmonary vascularity is prominent but more distinct today. The endotracheal tube tip lies approximately 3 cm above the carina. The esophagogastric tube tip projects below the inferior margin of the image. IMPRESSION: Improving infiltrate or atelectasis in the left perihilar region. Persistent mild cardiomegaly with improving central pulmonary vascular congestion. Subtle right infrahilar interstitial density more conspicuous today may reflect atelectasis or less likely pneumonia. The support tubes are in reasonable  position. Electronically Signed   By: David  [place] M.D.   On: 07/03/2015 07:29   Dg Chest Port 1 View  07/02/2015  CLINICAL DATA:  Overdose.  Status post intubation. EXAM: PORTABLE CHEST 1 VIEW COMPARISON:  07/01/2015 FINDINGS: Endotracheal tube tip projects 17 mm above the carina, well positioned. Oral/nasogastric tube passes below diaphragm. Tip is directed into the distal stomach. Hazy opacity projects over the left mid lung. This in part may be due to the supine rotated technique and overlying soft tissues. There is mild hazy opacity in the right perihilar region. Mild pulmonary edema is suspected. Cardiac silhouette is normal in size. IMPRESSION: 1. Endotracheal tube and nasal/orogastric tube are well positioned. 2. Mild pulmonary edema. Electronically Signed   By: Amie Portland M.D.   On: 07/02/2015 12:30   Dg Chest Port 1 View  07/01/2015  CLINICAL DATA:  25 year old female with unresponsiveness EXAM: PORTABLE CHEST 1 VIEW COMPARISON:  Radiograph dated 02/09/2014 FINDINGS: Single portable view of the chest demonstrates poor inspiratory effort with low lung volumes. There is mild diffuse interstitial prominence which may represent a degree of congestion versus edema. Superimposed pneumonia is not excluded. No focal consolidation, pleural effusion, or pneumothorax. Top-normal cardiac size. No acute osseous pathology. IMPRESSION: Mild central vascular and interstitial prominence may represent a degree of congestion versus edema. Clinical correlation is recommended. Electronically Signed   By: Elgie Collard M.D.   On: 07/01/2015 21:00    Medications:  Prior to Admission:  Prescriptions prior to admission  Medication Sig Dispense Refill Last Dose  . citalopram (CELEXA) 20 MG tablet Take 20 mg by mouth every morning.   Past Week at Unknown time  . gabapentin (NEURONTIN) 600 MG tablet Take 600 mg by mouth 3 (three) times daily.   Past Week at Unknown time  . levothyroxine (SYNTHROID, LEVOTHROID)  50 MCG tablet Take 50 mcg by mouth every morning.   Past Week at Unknown time  . oxyCODONE (OXY IR/ROXICODONE) 5 MG immediate release tablet Take 1 tablet (5 mg total) by mouth every 8 (eight) hours as needed for severe pain. 30 tablet 0 07/01/2015 at Unknown time  . prenatal vitamin w/FE, FA (PRENATAL 1 + 1) 27-1 MG TABS tablet Take 1 tablet by mouth every morning.   Past Week at Unknown time  . traZODone (DESYREL) 150 MG tablet Take 150 mg by mouth at bedtime.   Past Week  at Unknown time  . ondansetron (ZOFRAN) 4 MG tablet Take 1 tablet (4 mg total) by mouth every 6 (six) hours as needed for nausea. (Patient not taking: Reported on 07/01/2015) 20 tablet 0   . pantoprazole (PROTONIX) 40 MG tablet Take 1 tablet (40 mg total) by mouth daily. (Patient not taking: Reported on 07/01/2015) 30 tablet 2    Scheduled: . antiseptic oral rinse  7 mL Mouth Rinse QID  . chlorhexidine gluconate  15 mL Mouth Rinse BID  . Chlorhexidine Gluconate Cloth  6 each Topical Q0600  . heparin  5,000 Units Subcutaneous 3 times per day  . levalbuterol  0.63 mg Nebulization Q6H  . mupirocin ointment  1 application Nasal BID  . pantoprazole (PROTONIX) IV  40 mg Intravenous Q24H  . piperacillin-tazobactam (ZOSYN)  IV  3.375 g Intravenous Q8H  . vancomycin  1,000 mg Intravenous Q12H   Continuous: . sodium chloride    . sodium chloride 1,000 mL (07/03/15 0615)  . dexmedetomidine 0.5 mcg/kg/hr (07/03/15 0756)  . norepinephrine (LEVOPHED) Adult infusion 5 mcg/min (07/03/15 0754)   MVH:QIONGEXB (SUBLIMAZE) injection, LORazepam, ondansetron **OR** ondansetron (ZOFRAN) IV  Assesment: She has acute respiratory failure requiring ventilator support. She has been septic with elevated lactic acid levels and she is requiring pressor support. Her liver enzymes are elevated. She has a history of polysubstance abuse. I think she has probably aspirated and chest x-ray would be consistent with that. Active Problems:   Transaminitis    History of substance abuse   Overdose   Lactic acidosis   Acute respiratory failure (HCC)   Sepsis (HCC)   AKI (acute kidney injury) (HCC)   Altered mental status   Pyrexia    Plan: Continue ventilator support. Hopefully we can wean pressors. I agree with current antibiotics. Addmoderate dose steroids with pneumonia    LOS: 1 day   Melenda Bielak L 07/03/2015, 8:05 AM

## 2015-07-03 NOTE — Progress Notes (Signed)
Initial Nutrition Assessment  INTERVENTION: If unable to wean pt within 24-48 hrs: Initiate Vital HP @ 20 ml/hr via NGand increase by 10 ml every 4 hours to goal rate of 50 ml/hr.   Tube feeding regimen provides 1200 kcal (100% of needs), 105 grams of protein, and 1003 ml of H2O.    NUTRITION DIAGNOSIS:   Inadequate oral intake related to inability to eat as evidenced by intubation   GOAL: Pt to meet >/= 90% of their estimated nutrition needs     MONITOR: respiratory status   REASON FOR ASSESSMENT:   Ventilator    ASSESSMENT:   This is a 25 year old who was brought to the emergency department obtunded. She had apparently been seen the day prior to admission with a drug overdose but left AMA. She has been closely monitored by her family and they did not see her do any further drugs but she was found unresponsive on the floor of her home on the day of admission Unable to perform full nutrition focused exam at this time. No observed fat or muscle loss. Edema   Labs:  Diet Order:  Diet NPO time specified  Skin:   intact  Last BM:   prior to admission  Height:   Ht Readings from Last 1 Encounters:  07/02/15  (1.626 m)    Weight:   Wt Readings from Last 1 Encounters:  07/03/15 188 lb 0.8 oz (85.3 kg)    Ideal Body Weight:  54.5 kg  BMI:  Body mass index is 32.26 kg/(m^2).  Estimated Nutritional Needs:   Kcal:  [phone]   Protein:  100-110 gr  Fluid:  >1500 ml daily  EDUCATION NEEDS:     Royann Shivers MS,RD,CSG,LDN Office: [phone] Pager: [phone]

## 2015-07-04 ENCOUNTER — Inpatient Hospital Stay (HOSPITAL_COMMUNITY): Payer: Self-pay

## 2015-07-04 DIAGNOSIS — F3130 Bipolar disorder, current episode depressed, mild or moderate severity, unspecified: Secondary | ICD-10-CM

## 2015-07-04 DIAGNOSIS — J9601 Acute respiratory failure with hypoxia: Secondary | ICD-10-CM

## 2015-07-04 DIAGNOSIS — R400 Somnolence: Secondary | ICD-10-CM

## 2015-07-04 LAB — BLOOD GAS, ARTERIAL
Acid-base deficit: 4.2 mmol/L — ABNORMAL HIGH (ref 0.0–2.0)
BICARBONATE: 22 meq/L (ref 20.0–24.0)
Drawn by: 105551
FIO2: 0.4
LHR: 22 {breaths}/min
O2 SAT: 98.7 %
PCO2 ART: 22.3 mmHg — AB (ref 35.0–45.0)
PEEP/CPAP: 5 cmH2O
PO2 ART: 122 mmHg — AB (ref 80.0–100.0)
VT: 480 mL
pH, Arterial: 7.523 — ABNORMAL HIGH (ref 7.350–7.450)

## 2015-07-04 MED ORDER — DEXMEDETOMIDINE HCL IN NACL 200 MCG/50ML IV SOLN
0.2000 ug/kg/h | INTRAVENOUS | Status: DC
  Administered 2015-07-04: 0.4 ug/kg/h via INTRAVENOUS

## 2015-07-04 MED ORDER — DEXMEDETOMIDINE HCL IN NACL 200 MCG/50ML IV SOLN
INTRAVENOUS | Status: AC
  Administered 2015-07-04: 200 ug
  Filled 2015-07-04: qty 50

## 2015-07-04 MED ORDER — LORAZEPAM 2 MG/ML IJ SOLN
1.0000 mg | INTRAMUSCULAR | Status: DC | PRN
  Administered 2015-07-05: 1 mg via INTRAVENOUS
  Filled 2015-07-04: qty 1

## 2015-07-04 MED ORDER — NICOTINE 14 MG/24HR TD PT24
14.0000 mg | MEDICATED_PATCH | Freq: Every day | TRANSDERMAL | Status: DC
  Administered 2015-07-04 – 2015-07-05 (×2): 14 mg via TRANSDERMAL
  Filled 2015-07-04 (×2): qty 1

## 2015-07-04 NOTE — Procedures (Signed)
Extubation Procedure Note  Patient Details:   Name: Becky [name] DOB: December 02, 1990 MRN: [id number]   Airway Documentation:  Airway 7.5 mm (Active)  Secured at (cm) 22 cm 07/04/2015  9:13 AM  Measured From Lips 07/04/2015  9:13 AM  Secured Location Center 07/04/2015  8:56 AM  Secured By [employer] 07/04/2015  8:56 AM  Tube Holder Repositioned Yes 07/04/2015  5:00 AM  Cuff Pressure (cm H2O) 25 cm H2O 07/04/2015  8:56 AM  Site Condition Dry 07/04/2015  8:56 AM    Evaluation  O2 sats: stable throughout Complications: No apparent complications Patient did tolerate procedure well. Bilateral Breath Sounds: Diminished Suctioning: Oral, Airway    Seward Meth [facility] 07/04/2015, 11:23 AM

## 2015-07-04 NOTE — Progress Notes (Signed)
Subjective: She was intubated for respiratory failure following drug overdose. She is much more alert now. We are weaning with intent to extubate. According to family she does have some element of asthma and has had bronchitis.  Objective: Vital signs in last 24 hours: Temp:  [98.7 F (37.1 C)-100.4 F (38 C)] 98.7 F (37.1 C) (02/18 0400) Pulse Rate:  [69-119] 75 (02/18 0913) Resp:  [16-23] 19 (02/18 0913) BP: (109-132)/(71-103) 129/80 mmHg (02/18 0800) SpO2:  [92 %-98 %] 96 % (02/18 0913) FiO2 (%):  [40 %] 40 % (02/18 0913) Weight:  [85.9 kg (189 lb 6 oz)] 85.9 kg (189 lb 6 oz) (02/18 0400) Weight change: 4 kg (8 lb 13.1 oz) Last BM Date: 07/01/15  Intake/Output from previous day: 02/17 0701 - 02/18 0700 In: 3986 [I.V.:3926; NG/GT:60] Out: 2350 [Urine:1850; Emesis/NG output:500]  PHYSICAL EXAM General appearance: alert and Intubated and sedated but more arousable Resp: rhonchi bilaterally Cardio: regular rate and rhythm, S1, S2 normal, no murmur, click, rub or gallop GI: soft, non-tender; bowel sounds normal; no masses,  no organomegaly Extremities: extremities normal, atraumatic, no cyanosis or edema  Lab Results:  Results for orders placed or performed during the hospital encounter of 07/02/15 (from the past 48 hour(s))  Culture, blood (routine x 2)     Status: None (Preliminary result)   Collection Time: 07/02/15 12:30 PM  Result Value Ref Range   Specimen Description BLOOD RIGHT ARM    Special Requests BOTTLES DRAWN AEROBIC AND ANAEROBIC 6CC EACH    Culture NO GROWTH 2 DAYS    Report Status PENDING   Ammonia     Status: None   Collection Time: 07/02/15 12:32 PM  Result Value Ref Range   Ammonia 34 9 - 35 umol/L  Comprehensive metabolic panel     Status: Abnormal   Collection Time: 07/02/15 12:32 PM  Result Value Ref Range   Sodium 139 135 - 145 mmol/L   Potassium 5.6 (H) 3.5 - 5.1 mmol/L    Comment: CRITICAL RESULT CALLED TO, READ BACK BY AND VERIFIED  WITH: HONEYCUTT,C AT 1337 BY HUFFINES,S ON 07/02/15.    Chloride 105 101 - 111 mmol/L   CO2 23 22 - 32 mmol/L   Glucose, Bld 125 (H) 65 - 99 mg/dL   BUN 10 6 - 20 mg/dL   Creatinine, Ser 1.65 (H) 0.44 - 1.00 mg/dL   Calcium 7.7 (L) 8.9 - 10.3 mg/dL   Total Protein 6.5 6.5 - 8.1 g/dL   Albumin 3.4 (L) 3.5 - 5.0 g/dL   AST 70 (H) 15 - 41 U/L   ALT 238 (H) 14 - 54 U/L   Alkaline Phosphatase 101 38 - 126 U/L   Total Bilirubin 0.5 0.3 - 1.2 mg/dL   GFR calc non Af Amer 42 (L) >60 mL/min   GFR calc Af Amer 49 (L) >60 mL/min    Comment: (NOTE) The eGFR has been calculated using the CKD EPI equation. This calculation has not been validated in all clinical situations. eGFR's persistently <60 mL/min signify possible Chronic Kidney Disease.    Anion gap 11 5 - 15  CBC WITH DIFFERENTIAL     Status: Abnormal   Collection Time: 07/02/15 12:32 PM  Result Value Ref Range   WBC 16.0 (H) 4.0 - 10.5 K/uL   RBC 3.95 3.87 - 5.11 MIL/uL   Hemoglobin 11.8 (L) 12.0 - 15.0 g/dL   HCT 36.8 36.0 - 46.0 %   MCV 93.2 78.0 - 100.0 fL  MCH 29.9 26.0 - 34.0 pg   MCHC 32.1 30.0 - 36.0 g/dL   RDW 14.0 11.5 - 15.5 %   Platelets 291 150 - 400 K/uL   Neutrophils Relative % 83 %   Neutro Abs 13.1 (H) 1.7 - 7.7 K/uL   Lymphocytes Relative 9 %   Lymphs Abs 1.5 0.7 - 4.0 K/uL   Monocytes Relative 8 %   Monocytes Absolute 1.3 (H) 0.1 - 1.0 K/uL   Eosinophils Relative 0 %   Eosinophils Absolute 0.0 0.0 - 0.7 K/uL   Basophils Relative 0 %   Basophils Absolute 0.0 0.0 - 0.1 K/uL  Ethanol     Status: None   Collection Time: 07/02/15 12:32 PM  Result Value Ref Range   Alcohol, Ethyl (B) <5 <5 mg/dL    Comment:        LOWEST DETECTABLE LIMIT FOR SERUM ALCOHOL IS 5 mg/dL FOR MEDICAL PURPOSES ONLY   Lactic acid, plasma     Status: Abnormal   Collection Time: 07/02/15 12:32 PM  Result Value Ref Range   Lactic Acid, Venous 4.8 (HH) 0.5 - 2.0 mmol/L    Comment: CRITICAL RESULT CALLED TO, READ BACK BY AND  VERIFIED WITH: HONEYCUTT,C AT 1335 ON 07/02/15 BY HUFFINES,S.   Urine culture     Status: None (Preliminary result)   Collection Time: 07/02/15 12:45 PM  Result Value Ref Range   Specimen Description URINE, CATHETERIZED    Special Requests NONE    Culture      TOO YOUNG TO READ Performed at [facility]    Report Status PENDING   Urinalysis, Routine w reflex microscopic     Status: Abnormal   Collection Time: 07/02/15 12:45 PM  Result Value Ref Range   Color, Urine YELLOW YELLOW   APPearance CLEAR CLEAR   Specific Gravity, Urine 1.025 1.005 - 1.030   pH 5.5 5.0 - 8.0   Glucose, UA NEGATIVE NEGATIVE mg/dL   Hgb urine dipstick TRACE (A) NEGATIVE   Bilirubin Urine NEGATIVE NEGATIVE   Ketones, ur NEGATIVE NEGATIVE mg/dL   Protein, ur 30 (A) NEGATIVE mg/dL   Nitrite NEGATIVE NEGATIVE   Leukocytes, UA NEGATIVE NEGATIVE  Urine microscopic-add on     Status: Abnormal   Collection Time: 07/02/15 12:45 PM  Result Value Ref Range   Squamous Epithelial / LPF 6-30 (A) NONE SEEN   WBC, UA 6-30 0 - 5 WBC/hpf   RBC / HPF 0-5 0 - 5 RBC/hpf   Bacteria, UA MANY (A) NONE SEEN   Urine-Other AMORPHOUS URATES/PHOSPHATES   Urine rapid drug screen (hosp performed)     Status: Abnormal   Collection Time: 07/02/15 12:47 PM  Result Value Ref Range   Opiates POSITIVE (A) NONE DETECTED   Cocaine POSITIVE (A) NONE DETECTED   Benzodiazepines POSITIVE (A) NONE DETECTED   Amphetamines NONE DETECTED NONE DETECTED   Tetrahydrocannabinol POSITIVE (A) NONE DETECTED   Barbiturates NONE DETECTED NONE DETECTED    Comment:        DRUG SCREEN FOR MEDICAL PURPOSES ONLY.  IF CONFIRMATION IS NEEDED FOR ANY PURPOSE, NOTIFY LAB WITHIN 5 DAYS.        LOWEST DETECTABLE LIMITS FOR URINE DRUG SCREEN Drug Class       Cutoff (ng/mL) Amphetamine      1000 Barbiturate      200 Benzodiazepine   631 Tricyclics       497 Opiates          300 Cocaine  300 THC              50   Blood gas, arterial      Status: Abnormal   Collection Time: 07/02/15  1:00 PM  Result Value Ref Range   FIO2 0.80    Delivery systems VENTILATOR    Mode PRESSURE REGULATED VOLUME CONTROL    VT 440 mL   LHR 18 resp/min   Peep/cpap 8.0 cm H20   pH, Arterial 7.244 (L) 7.350 - 7.450   pCO2 arterial 50.4 (H) 35.0 - 45.0 mmHg   pO2, Arterial 84.5 80.0 - 100.0 mmHg   Bicarbonate 19.5 (L) 20.0 - 24.0 mEq/L   Acid-base deficit 5.2 (H) 0.0 - 2.0 mmol/L   O2 Saturation 95.0 %   Patient temperature 37.6    Collection site RIGHT RADIAL    Drawn by [id number]    Sample type ARTERIAL DRAW    Allens test (pass/fail) PASS PASS  Lactic acid, plasma     Status: Abnormal   Collection Time: 07/02/15  4:21 PM  Result Value Ref Range   Lactic Acid, Venous 5.9 (HH) 0.5 - 2.0 mmol/L    Comment: CRITICAL RESULT CALLED TO, READ BACK BY AND VERIFIED WITH: WALLACE,L ON 07/02/15 AT 1710 BY LOY,C   Culture, blood (routine x 2)     Status: None (Preliminary result)   Collection Time: 07/02/15  5:45 PM  Result Value Ref Range   Specimen Description BLOOD LEFT HAND    Special Requests BOTTLES DRAWN AEROBIC ONLY 4CC    Culture NO GROWTH 2 DAYS    Report Status PENDING   Blood gas, arterial     Status: Abnormal   Collection Time: 07/02/15  6:40 PM  Result Value Ref Range   FIO2 0.70    Delivery systems VENTILATOR    Mode PRESSURE REGULATED VOLUME CONTROL    VT 440 mL   LHR 22 resp/min   Peep/cpap 5.0 cm H20   pH, Arterial 7.350 7.350 - 7.450   pCO2 arterial 36.0 35.0 - 45.0 mmHg   pO2, Arterial 190 (H) 80.0 - 100.0 mmHg   Bicarbonate 20.5 20.0 - 24.0 mEq/L   Acid-base deficit 5.2 (H) 0.0 - 2.0 mmol/L   O2 Saturation 99.1 %   Patient temperature 38.9    Collection site LEFT RADIAL    Drawn by [id number]    Sample type ARTERIAL DRAW    Allens test (pass/fail) PASS PASS  Lactic acid, plasma     Status: Abnormal   Collection Time: 07/02/15  6:47 PM  Result Value Ref Range   Lactic Acid, Venous 4.9 (HH) 0.5 - 2.0 mmol/L    Comment:  CRITICAL RESULT CALLED TO, READ BACK BY AND VERIFIED WITH: WATKINS,P ON 07/02/15 AT 1915 BY LOY,C   MRSA PCR Screening     Status: Abnormal   Collection Time: 07/02/15  8:40 PM  Result Value Ref Range   MRSA by PCR POSITIVE (A) NEGATIVE    Comment:        The GeneXpert MRSA Assay (FDA approved for NASAL specimens only), is one component of a comprehensive MRSA colonization surveillance program. It is not intended to diagnose MRSA infection nor to guide or monitor treatment for MRSA infections. CRITICAL RESULT CALLED TO, READ BACK BY AND VERIFIED WITH: STOPFUL,A AT 0539 ON 07/03/2015 BY WOODS, M   Procalcitonin     Status: None   Collection Time: 07/02/15  9:37 PM  Result Value Ref Range   Procalcitonin 8.53 ng/mL  Comment:        Interpretation: PCT > 2 ng/mL: Systemic infection (sepsis) is likely, unless other causes are known. (NOTE)         ICU PCT Algorithm               Non ICU PCT Algorithm    ----------------------------     ------------------------------         PCT < 0.25 ng/mL                 PCT < 0.1 ng/mL     Stopping of antibiotics            Stopping of antibiotics       strongly encouraged.               strongly encouraged.    ----------------------------     ------------------------------       PCT level decrease by               PCT < 0.25 ng/mL       >= 80% from peak PCT       OR PCT 0.25 - 0.5 ng/mL          Stopping of antibiotics                                             encouraged.     Stopping of antibiotics           encouraged.    ----------------------------     ------------------------------       PCT level decrease by              PCT >= 0.25 ng/mL       < 80% from peak PCT        AND PCT >= 0.5 ng/mL            Continuing antibiotics                                               encouraged.       Continuing antibiotics            encouraged.    ----------------------------     ------------------------------     PCT level increase  compared          PCT > 0.5 ng/mL         with peak PCT AND          PCT >= 0.5 ng/mL             Escalation of antibiotics                                          strongly encouraged.      Escalation of antibiotics        strongly encouraged.   Protime-INR     Status: Abnormal   Collection Time: 07/02/15  9:37 PM  Result Value Ref Range   Prothrombin Time 15.7 (H) 11.6 - 15.2 seconds   INR 1.23 0.00 - 1.49  APTT     Status: None   Collection Time: 07/02/15  9:37 PM  Result Value Ref Range   aPTT 28 24 - 37 seconds  Blood gas, arterial     Status: Abnormal   Collection Time: 07/03/15  3:56 AM  Result Value Ref Range   FIO2 40.00    Delivery systems VENTILATOR    Mode PRESSURE REGULATED VOLUME CONTROL    VT 440 mL   LHR 22 resp/min   Peep/cpap 5.0 cm H20   pH, Arterial 7.417 7.350 - 7.450   pCO2 arterial 29.3 (L) 35.0 - 45.0 mmHg   pO2, Arterial 93.3 80.0 - 100.0 mmHg   Bicarbonate 20.7 20.0 - 24.0 mEq/L   TCO2 16.9 0 - 100 mmol/L   Acid-Base Excess 5.2 (H) 0.0 - 2.0 mmol/L   O2 Saturation 96.9 %   Patient temperature 37.0    Collection site RIGHT RADIAL    Drawn by [id number]    Sample type ARTERIAL    Allens test (pass/fail) PASS PASS  CBC     Status: Abnormal   Collection Time: 07/03/15  4:15 AM  Result Value Ref Range   WBC 21.5 (H) 4.0 - 10.5 K/uL   RBC 4.19 3.87 - 5.11 MIL/uL   Hemoglobin 12.4 12.0 - 15.0 g/dL   HCT 37.5 36.0 - 46.0 %   MCV 89.5 78.0 - 100.0 fL   MCH 29.6 26.0 - 34.0 pg   MCHC 33.1 30.0 - 36.0 g/dL   RDW 14.1 11.5 - 15.5 %   Platelets 369 150 - 400 K/uL  Comprehensive metabolic panel     Status: Abnormal   Collection Time: 07/03/15  4:15 AM  Result Value Ref Range   Sodium 141 135 - 145 mmol/L   Potassium 4.0 3.5 - 5.1 mmol/L    Comment: DELTA CHECK NOTED   Chloride 112 (H) 101 - 111 mmol/L   CO2 20 (L) 22 - 32 mmol/L   Glucose, Bld 118 (H) 65 - 99 mg/dL   BUN 12 6 - 20 mg/dL   Creatinine, Ser 1.13 (H) 0.44 - 1.00 mg/dL   Calcium 7.3 (L)  8.9 - 10.3 mg/dL   Total Protein 4.9 (L) 6.5 - 8.1 g/dL   Albumin 2.5 (L) 3.5 - 5.0 g/dL   AST 76 (H) 15 - 41 U/L   ALT 157 (H) 14 - 54 U/L   Alkaline Phosphatase 76 38 - 126 U/L   Total Bilirubin 0.6 0.3 - 1.2 mg/dL   GFR calc non Af Amer >60 >60 mL/min   GFR calc Af Amer >60 >60 mL/min    Comment: (NOTE) The eGFR has been calculated using the CKD EPI equation. This calculation has not been validated in all clinical situations. eGFR's persistently <60 mL/min signify possible Chronic Kidney Disease.    Anion gap 9 5 - 15  Lactic acid, plasma     Status: None   Collection Time: 07/03/15  9:27 AM  Result Value Ref Range   Lactic Acid, Venous 1.7 0.5 - 2.0 mmol/L  Lactic acid, plasma     Status: None   Collection Time: 07/03/15 12:10 PM  Result Value Ref Range   Lactic Acid, Venous 1.6 0.5 - 2.0 mmol/L  TSH     Status: Abnormal   Collection Time: 07/03/15  6:37 PM  Result Value Ref Range   TSH 0.191 (L) 0.350 - 4.500 uIU/mL  Blood gas, arterial     Status: Abnormal   Collection Time: 07/04/15  5:00 AM  Result Value Ref Range   FIO2 0.40    Delivery  systems VENTILATOR    Mode PRESSURE REGULATED VOLUME CONTROL    VT 480 mL   LHR 22 resp/min   Peep/cpap 5.0 cm H20   pH, Arterial 7.523 (H) 7.350 - 7.450   pCO2 arterial 22.3 (L) 35.0 - 45.0 mmHg   pO2, Arterial 122.0 (H) 80.0 - 100.0 mmHg   Bicarbonate 22.0 20.0 - 24.0 mEq/L   Acid-base deficit 4.2 (H) 0.0 - 2.0 mmol/L   O2 Saturation 98.7 %   Collection site RADIAL    Drawn by [id number]    Sample type ARTERIAL    Allens test (pass/fail) PASS PASS    ABGS  Recent Labs  07/03/15 0356 07/04/15 0500  PHART 7.417 7.523*  PO2ART 93.3 122.0*  TCO2 16.9  --   HCO3 20.7 22.0   CULTURES Recent Results (from the past 240 hour(s))  Culture, blood (routine x 2)     Status: None (Preliminary result)   Collection Time: 07/02/15 12:30 PM  Result Value Ref Range Status   Specimen Description BLOOD RIGHT ARM  Final   Special  Requests BOTTLES DRAWN AEROBIC AND ANAEROBIC 6CC EACH  Final   Culture NO GROWTH 2 DAYS  Final   Report Status PENDING  Incomplete  Urine culture     Status: None (Preliminary result)   Collection Time: 07/02/15 12:45 PM  Result Value Ref Range Status   Specimen Description URINE, CATHETERIZED  Final   Special Requests NONE  Final   Culture   Final    TOO YOUNG TO READ Performed at [facility]    Report Status PENDING  Incomplete  Culture, blood (routine x 2)     Status: None (Preliminary result)   Collection Time: 07/02/15  5:45 PM  Result Value Ref Range Status   Specimen Description BLOOD LEFT HAND  Final   Special Requests BOTTLES DRAWN AEROBIC ONLY 4CC  Final   Culture NO GROWTH 2 DAYS  Final   Report Status PENDING  Incomplete  MRSA PCR Screening     Status: Abnormal   Collection Time: 07/02/15  8:40 PM  Result Value Ref Range Status   MRSA by PCR POSITIVE (A) NEGATIVE Final    Comment:        The GeneXpert MRSA Assay (FDA approved for NASAL specimens only), is one component of a comprehensive MRSA colonization surveillance program. It is not intended to diagnose MRSA infection nor to guide or monitor treatment for MRSA infections. CRITICAL RESULT CALLED TO, READ BACK BY AND VERIFIED WITH: STOPFUL,A AT [place] ON 07/03/2015 BY WOODS, M    Studies/Results: Ct Head Wo Contrast  07/02/2015  CLINICAL DATA:  Altered mental status EXAM: CT HEAD WITHOUT CONTRAST TECHNIQUE: Contiguous axial images were obtained from the base of the skull through the vertex without intravenous contrast. COMPARISON:  None. FINDINGS: Skull and Sinuses:Negative for fracture. Nasal cavity and nasopharynx fluid in the setting of intubation. Visualized orbits: Negative. Brain: Unremarkable. No evidence of acute infarction, hemorrhage, hydrocephalus, or mass lesion/mass effect. IMPRESSION: Negative head CT. Electronically Signed   By: Monte Fantasia M.D.   On: 07/02/2015 14:07   Dg Chest Port 1  View  07/04/2015  CLINICAL DATA:  Shortness of breath. EXAM: PORTABLE CHEST 1 VIEW COMPARISON:  Yesterday. FINDINGS: Endotracheal tube in satisfactory position. Nasogastric tube extending into the stomach. Stable enlarged cardiac silhouette. Mild increase in prominence of the pulmonary vasculature and interstitial markings. Probable small bilateral pleural effusions. IMPRESSION: Mild worsening of mild congestive heart failure. Electronically Signed  By: Claudie Revering M.D.   On: 07/04/2015 09:13   Portable Chest Xray  07/03/2015  CLINICAL DATA:  Ventilated patient, acute respiratory failure, sepsis, overdose, current smoker. EXAM: PORTABLE CHEST 1 VIEW COMPARISON:  Portable chest x-ray of July 02, 2015 FINDINGS: The lungs are mildly hypoinflated. Infiltrate in the left mid lung has improved. The costophrenic angles are sharp. Hazy density in the right infrahilar region is noted. The heart is mildly enlarged. The central pulmonary vascularity is prominent but more distinct today. The endotracheal tube tip lies approximately 3 cm above the carina. The esophagogastric tube tip projects below the inferior margin of the image. IMPRESSION: Improving infiltrate or atelectasis in the left perihilar region. Persistent mild cardiomegaly with improving central pulmonary vascular congestion. Subtle right infrahilar interstitial density more conspicuous today may reflect atelectasis or less likely pneumonia. The support tubes are in reasonable position. Electronically Signed   By: David  [place]ique M.D.   On: 07/03/2015 07:29   Dg Chest Port 1 View  07/02/2015  CLINICAL DATA:  Overdose.  Status post intubation. EXAM: PORTABLE CHEST 1 VIEW COMPARISON:  07/01/2015 FINDINGS: Endotracheal tube tip projects 17 mm above the carina, well positioned. Oral/nasogastric tube passes below diaphragm. Tip is directed into the distal stomach. Hazy opacity projects over the left mid lung. This in part may be due to the supine rotated  technique and overlying soft tissues. There is mild hazy opacity in the right perihilar region. Mild pulmonary edema is suspected. Cardiac silhouette is normal in size. IMPRESSION: 1. Endotracheal tube and nasal/orogastric tube are well positioned. 2. Mild pulmonary edema. Electronically Signed   By: Lajean Manes M.D.   On: 07/02/2015 12:30    Medications:  Prior to Admission:  Prescriptions prior to admission  Medication Sig Dispense Refill Last Dose  . citalopram (CELEXA) 20 MG tablet Take 20 mg by mouth every morning.   Past Week at Unknown time  . gabapentin (NEURONTIN) 600 MG tablet Take 600 mg by mouth 3 (three) times daily.   Past Week at Unknown time  . levothyroxine (SYNTHROID, LEVOTHROID) 50 MCG tablet Take 50 mcg by mouth every morning.   Past Week at Unknown time  . oxyCODONE (OXY IR/ROXICODONE) 5 MG immediate release tablet Take 1 tablet (5 mg total) by mouth every 8 (eight) hours as needed for severe pain. 30 tablet 0 07/01/2015 at Unknown time  . prenatal vitamin w/FE, FA (PRENATAL 1 + 1) 27-1 MG TABS tablet Take 1 tablet by mouth every morning.   Past Week at Unknown time  . traZODone (DESYREL) 150 MG tablet Take 150 mg by mouth at bedtime.   Past Week at Unknown time  . ondansetron (ZOFRAN) 4 MG tablet Take 1 tablet (4 mg total) by mouth every 6 (six) hours as needed for nausea. (Patient not taking: Reported on 07/01/2015) 20 tablet 0   . pantoprazole (PROTONIX) 40 MG tablet Take 1 tablet (40 mg total) by mouth daily. (Patient not taking: Reported on 07/01/2015) 30 tablet 2    Scheduled: . antiseptic oral rinse  7 mL Mouth Rinse QID  . chlorhexidine gluconate  15 mL Mouth Rinse BID  . Chlorhexidine Gluconate Cloth  6 each Topical Q0600  . heparin  5,000 Units Subcutaneous 3 times per day  . levalbuterol  0.63 mg Nebulization Q6H  . methylPREDNISolone (SOLU-MEDROL) injection  40 mg Intravenous Q12H  . mupirocin ointment  1 application Nasal BID  . pantoprazole (PROTONIX) IV  40  mg Intravenous Q24H  .  piperacillin-tazobactam (ZOSYN)  IV  3.375 g Intravenous Q8H  . vancomycin  1,000 mg Intravenous Q12H   Continuous: . sodium chloride    . sodium chloride 150 mL/hr at 07/03/15 1843   XLK:GMWNUUVO (SUBLIMAZE) injection, LORazepam, LORazepam, ondansetron **OR** ondansetron (ZOFRAN) IV  Assesment: She was admitted with acute respiratory failure related to substance abuse/drug overdose. She has improved. We are weaning with probable extubation.  She appeared to be septic on admission with lactic acidemia and that is better. She still has low-grade fever.  At baseline she has bipolar 1 disorder and polysubstance abuse. I agree with Dr. Gus Puma opinion that if she tries to check out [place] again we should involuntarily commit her  Active Problems:   Transaminitis   History of substance abuse   Overdose   Lactic acidosis   Acute respiratory failure (HCC)   Sepsis ([place])   AKI (acute kidney injury) ([name])   Altered mental status   Pyrexia    Plan: Wean with intent to extubate assuming she does okay. Continue other treatments    LOS: 2 days   Farhan Jean L 07/04/2015, 9:56 AM

## 2015-07-04 NOTE — Progress Notes (Signed)
Patient rate decreased to 16, vt had been increased to 480 , ve 7.7

## 2015-07-04 NOTE — Consult Note (Signed)
Telepsych Consultation   Reason for Consult:  Psychiatric Evaluation Referring Physician:  Dr. Orvan Falconer Patient Identification: Becky [name] MRN:  [id number] Principal Diagnosis: Bipolar 1 disorder, most recent episode depressed Diagnosis:   Patient Active Problem List   Diagnosis Date Noted  . Bipolar I disorder, most recent episode depressed ([place]) [F31.30] 07/04/2015  . Overdose [T50.901A] 07/02/2015  . Lactic acidosis [E87.2] 07/02/2015  . Acute respiratory failure ([place]) [J96.00] 07/02/2015  . Sepsis ([place]) [A41.9] 07/02/2015  . AKI (acute kidney injury) ([place]) [N17.9]   . Altered mental status [R41.82]   . Pyrexia [R50.9]   . Abdominal pain, epigastric [R10.13] 06/17/2015  . Hepatitis C antibody test positive [R89.4] 06/17/2015  . Peptic ulcer disease [K27.9] 06/17/2015  . Epigastric pain [R10.13]   . Transaminitis [R74.0] 06/16/2015  . History of substance abuse [Z87.898] 06/16/2015  . Bipolar affective disorder, current episode manic with psychotic symptoms ([place]) [F31.2] 03/03/2014    Total Time spent with patient: 50 minutes  Subjective:   Becky [name] is a 25 y.o. female patient who states "I OD'd".   HPI:  Becky [name] is a 25 year old Caucasian female who presented to [facility] ED on 07/02/15 for possible overdose. She was seen on 07/01/2015 in the ED for same complaint and left AGAINST MEDICAL ADVICE. Intubation was necessary and she was extubated today. Per note today by Dr. Orvan Falconer, patient eluded to trying to leave again AMA, and the process of involuntary commitment paperwork was initiated and a psychiatric consult was ordered. The patient is seen today via telepsychiatry. The patient is alert with intermittent confusion. She is able to state her name and the year but is unable to state her current location. She states that she overdosed on heroin, cocaine, amphetamines, marijuana and also states "somebody laced my stuff with fentanyl." Her urine drug screen on  07/01/15 was positive for amphetamines, benzodiazepines, opiates, cocaine, and THC. Prior to this overdose, she states that she had been sober for the past 4 months but relapsed on clonazepam which escalated to her using other substances.  She has a 57 month old infant who is currently in foster care due to the patient having a positive drug screen at the time of delivery. She reports she has a history of depression but her depression became worse after the birth of her child. She was seeing Dr. Elana Alm at [facility] in [place] who started her on Celexa for depression, but she has not seen this provider in the past 2 months, nor has she had follow-up with her OB/GYN.   Today, she states that she has been depressed a lot lately. She endorses decreased sleep, decreased appetite, weight loss, with 20 pound loss in the past 3 months. She also endorses mood fluctuations and irritability. She states "I feel like shit." She denies that her current substance use was an attempt at suicide. Today, she denies suicidal ideation, intent or plan. She denies any desire to harm her baby. She denies auditory or visual hallucinations.  She reports her last treatment for substance use was 4 months ago at [facility] in [place], [place]. She reports other hospitalizations at [facility] (unable to recall date), [facility] in 2013, [facility] (unable to recall date), and [facility] in 2010 and 2015.  At this point, she feels she need help with her depression and substance abuse as states she is willing to go to an inpatient facility for treatment once she is medically cleared.    Past Psychiatric History: Anxiety, depression,  and bipolar disorder  Risk to Self: Is patient at risk for suicide?: No Risk to Others:   Prior Inpatient Therapy:   Prior Outpatient Therapy:    Past Medical History:  Past Medical History  Diagnosis Date  . Bronchitis   . Peptic ulcer   . Bipolar 1 disorder ([place])   . History of substance abuse  06/16/2015  . Hepatitis C antibody test positive 06/17/2015  . Polysubstance abuse     Past Surgical History  Procedure Laterality Date  . Other surgical history      scar tissue removed from right ovary   . Other surgical history      fallopian tube repair  . Wisdom tooth extraction     Family History:  Family History  Problem Relation Age of Onset  . Cancer Other   . Drug abuse Brother    Family Psychiatric  History: unknown Social History:  History  Alcohol Use  . Yes    Comment: occ     History  Drug Use  . Yes  . Special: Marijuana, Methamphetamines, MDMA (Ecstacy), Oxycodone, Benzodiazepines    Comment: 14 months ago heroin    Social History   Social History  . Marital Status: Legally Separated    Spouse Name: N/A  . Number of Children: N/A  . Years of Education: N/A   Social History Main Topics  . Smoking status: Current Every Day Smoker -- 1.00 packs/day for 10 years    Types: Cigarettes  . Smokeless tobacco: Never Used  . Alcohol Use: Yes     Comment: occ  . Drug Use: Yes    Special: Marijuana, Methamphetamines, MDMA (Ecstacy), Oxycodone, Benzodiazepines     Comment: 14 months ago heroin  . Sexual Activity: Yes    Birth Control/ Protection: None   Other Topics Concern  . None   Social History Narrative   Additional Social History:    Allergies:   Allergies  Allergen Reactions  . Nsaids Other (See Comments)    Peptic ulcers   . Tylenol [Acetaminophen] Itching and Rash    Labs:  Results for orders placed or performed during the hospital encounter of 07/02/15 (from the past 48 hour(s))  Procalcitonin     Status: None   Collection Time: 07/02/15  9:37 PM  Result Value Ref Range   Procalcitonin 8.53 ng/mL    Comment:        Interpretation: PCT > 2 ng/mL: Systemic infection (sepsis) is likely, unless other causes are known. (NOTE)         ICU PCT Algorithm               Non ICU PCT Algorithm    ----------------------------      ------------------------------         PCT < 0.25 ng/mL                 PCT < 0.1 ng/mL     Stopping of antibiotics            Stopping of antibiotics       strongly encouraged.               strongly encouraged.    ----------------------------     ------------------------------       PCT level decrease by               PCT < 0.25 ng/mL       >= 80% from peak PCT  OR PCT 0.25 - 0.5 ng/mL          Stopping of antibiotics                                             encouraged.     Stopping of antibiotics           encouraged.    ----------------------------     ------------------------------       PCT level decrease by              PCT >= 0.25 ng/mL       < 80% from peak PCT        AND PCT >= 0.5 ng/mL            Continuing antibiotics                                               encouraged.       Continuing antibiotics            encouraged.    ----------------------------     ------------------------------     PCT level increase compared          PCT > 0.5 ng/mL         with peak PCT AND          PCT >= 0.5 ng/mL             Escalation of antibiotics                                          strongly encouraged.      Escalation of antibiotics        strongly encouraged.   Protime-INR     Status: Abnormal   Collection Time: 07/02/15  9:37 PM  Result Value Ref Range   Prothrombin Time 15.7 (H) 11.6 - 15.2 seconds   INR 1.23 0.00 - 1.49  APTT     Status: None   Collection Time: 07/02/15  9:37 PM  Result Value Ref Range   aPTT 28 24 - 37 seconds  Blood gas, arterial     Status: Abnormal   Collection Time: 07/03/15  3:56 AM  Result Value Ref Range   FIO2 40.00    Delivery systems VENTILATOR    Mode PRESSURE REGULATED VOLUME CONTROL    VT 440 mL   LHR 22 resp/min   Peep/cpap 5.0 cm H20   pH, Arterial 7.417 7.350 - 7.450   pCO2 arterial 29.3 (L) 35.0 - 45.0 mmHg   pO2, Arterial 93.3 80.0 - 100.0 mmHg   Bicarbonate 20.7 20.0 - 24.0 mEq/L   TCO2 16.9 0 - 100 mmol/L    Acid-Base Excess 5.2 (H) 0.0 - 2.0 mmol/L   O2 Saturation 96.9 %   Patient temperature 37.0    Collection site RIGHT RADIAL    Drawn by [id number]    Sample type ARTERIAL    Allens test (pass/fail) PASS PASS  CBC     Status: Abnormal   Collection Time: 07/03/15  4:15 AM  Result Value Ref Range   WBC 21.5 (H) 4.0 - 10.5 K/uL  RBC 4.19 3.87 - 5.11 MIL/uL   Hemoglobin 12.4 12.0 - 15.0 g/dL   HCT 37.5 36.0 - 46.0 %   MCV 89.5 78.0 - 100.0 fL   MCH 29.6 26.0 - 34.0 pg   MCHC 33.1 30.0 - 36.0 g/dL   RDW 14.1 11.5 - 15.5 %   Platelets 369 150 - 400 K/uL  Comprehensive metabolic panel     Status: Abnormal   Collection Time: 07/03/15  4:15 AM  Result Value Ref Range   Sodium 141 135 - 145 mmol/L   Potassium 4.0 3.5 - 5.1 mmol/L    Comment: DELTA CHECK NOTED   Chloride 112 (H) 101 - 111 mmol/L   CO2 20 (L) 22 - 32 mmol/L   Glucose, Bld 118 (H) 65 - 99 mg/dL   BUN 12 6 - 20 mg/dL   Creatinine, Ser 1.13 (H) 0.44 - 1.00 mg/dL   Calcium 7.3 (L) 8.9 - 10.3 mg/dL   Total Protein 4.9 (L) 6.5 - 8.1 g/dL   Albumin 2.5 (L) 3.5 - 5.0 g/dL   AST 76 (H) 15 - 41 U/L   ALT 157 (H) 14 - 54 U/L   Alkaline Phosphatase 76 38 - 126 U/L   Total Bilirubin 0.6 0.3 - 1.2 mg/dL   GFR calc non Af Amer >60 >60 mL/min   GFR calc Af Amer >60 >60 mL/min    Comment: (NOTE) The eGFR has been calculated using the CKD EPI equation. This calculation has not been validated in all clinical situations. eGFR's persistently <60 mL/min signify possible Chronic Kidney Disease.    Anion gap 9 5 - 15  Lactic acid, plasma     Status: None   Collection Time: 07/03/15  9:27 AM  Result Value Ref Range   Lactic Acid, Venous 1.7 0.5 - 2.0 mmol/L  Lactic acid, plasma     Status: None   Collection Time: 07/03/15 12:10 PM  Result Value Ref Range   Lactic Acid, Venous 1.6 0.5 - 2.0 mmol/L  TSH     Status: Abnormal   Collection Time: 07/03/15  6:37 PM  Result Value Ref Range   TSH 0.191 (L) 0.350 - 4.500 uIU/mL  Blood gas,  arterial     Status: Abnormal   Collection Time: 07/04/15  5:00 AM  Result Value Ref Range   FIO2 0.40    Delivery systems VENTILATOR    Mode PRESSURE REGULATED VOLUME CONTROL    VT 480 mL   LHR 22 resp/min   Peep/cpap 5.0 cm H20   pH, Arterial 7.523 (H) 7.350 - 7.450   pCO2 arterial 22.3 (L) 35.0 - 45.0 mmHg   pO2, Arterial 122.0 (H) 80.0 - 100.0 mmHg   Bicarbonate 22.0 20.0 - 24.0 mEq/L   Acid-base deficit 4.2 (H) 0.0 - 2.0 mmol/L   O2 Saturation 98.7 %   Collection site RADIAL    Drawn by [id number]    Sample type ARTERIAL    Allens test (pass/fail) PASS PASS    Current Facility-Administered Medications  Medication Dose Route Frequency Provider Last Rate Last Dose  . 0.9 %  sodium chloride infusion   Intravenous Continuous Virgel Manifold, MD      . 0.9 %  sodium chloride infusion   Intravenous Continuous Waldemar Dickens, MD 150 mL/hr at 07/04/15 1614    . chlorhexidine gluconate (PERIDEX) 0.12 % solution 15 mL  15 mL Mouth Rinse BID Waldemar Dickens, MD   15 mL at 07/04/15 2009  . Chlorhexidine  Gluconate Cloth 2 % PADS 6 each  6 each Topical Q0600 Waldemar Dickens, MD   6 each at 07/04/15 [phone]  . fentaNYL (SUBLIMAZE) injection 25 mcg  25 mcg Intravenous Q2H PRN Waldemar Dickens, MD   25 mcg at 07/04/15 2020  . heparin injection 5,000 Units  5,000 Units Subcutaneous 3 times per day Waldemar Dickens, MD   5,000 Units at 07/04/15 1418  . levalbuterol (XOPENEX) nebulizer solution 0.63 mg  0.63 mg Nebulization Q6H Waldemar Dickens, MD   0.63 mg at 07/04/15 1857  . LORazepam (ATIVAN) injection 1 mg  1 mg Intravenous Q4H PRN Orvan Falconer, MD      . LORazepam (ATIVAN) injection 1-2 mg  1-2 mg Intravenous Q1H PRN Waldemar Dickens, MD      . methylPREDNISolone sodium succinate (SOLU-MEDROL) 40 mg/mL injection 40 mg  40 mg Intravenous Q12H Sinda Du, MD   40 mg at 07/04/15 2020  . mupirocin ointment (BACTROBAN) 2 % 1 application  1 application Nasal BID Waldemar Dickens, MD   1 application at 55/73/22  0900  . nicotine (NICODERM CQ - dosed in mg/24 hours) patch 14 mg  14 mg Transdermal Daily Orvan Falconer, MD   14 mg at 07/04/15 1743  . ondansetron (ZOFRAN) tablet 4 mg  4 mg Oral Q6H PRN Waldemar Dickens, MD       Or  . ondansetron [facility]) injection 4 mg  4 mg Intravenous Q6H PRN Waldemar Dickens, MD   4 mg at 07/04/15 2020  . pantoprazole (PROTONIX) injection 40 mg  40 mg Intravenous Q24H Waldemar Dickens, MD   40 mg at 07/03/15 2202  . piperacillin-tazobactam (ZOSYN) IVPB 3.375 g  3.375 g Intravenous Q8H Waldemar Dickens, MD   3.375 g at 07/04/15 1614  . vancomycin (VANCOCIN) IVPB 1000 mg/200 mL premix  1,000 mg Intravenous Q12H Orvan Falconer, MD   1,000 mg at 07/04/15 1416    Musculoskeletal: Strength & Muscle Tone: unable to assess; patient seen via telepsychiatry [employer]: unable to assess; patient seen via telepsychiatry Patient leans: unable to assess; patient seen via telepsychiatry  Psychiatric Specialty Exam: Review of Systems  Constitutional: Positive for weight loss.  HENT: Negative.   Eyes: Negative.   Respiratory: Negative.   Cardiovascular: Negative.   Gastrointestinal: Negative.   Genitourinary: Negative.   Musculoskeletal: Negative.   Skin: Negative.   Neurological: Negative.   Endo/Heme/Allergies: Negative.   Psychiatric/Behavioral: Positive for depression and substance abuse. The patient is nervous/anxious.     Blood pressure 147/88, pulse 82, temperature 98 F (36.7 C), temperature source Oral, resp. rate 25, height '5\' 2"'  (1.575 m), weight 85.9 kg (189 lb 6 oz), last menstrual period 06/15/2015, SpO2 100 %.Body mass index is 34.63 kg/(m^2).  General Appearance: Casual, dressed in hospital gown  Eye Contact::  Fair  Speech:  Clear and Coherent and Slow  Volume:  Decreased  Mood:  Depressed  Affect:  Congruent and Depressed  Thought Process:  Coherent  Orientation:  Other:  Alert to person and time  Thought Content:  NA  Suicidal Thoughts:  No  Homicidal  Thoughts:  No  Memory:  Immediate;   Good Recent;   Fair Remote;   Fair  Judgement:  Poor  Insight:  Fair  Psychomotor Activity:  Normal  Concentration:  Fair  Recall:  [employer] of Knowledge:Fair  Language: Fair  Akathisia:  No  Handed:  Right  AIMS (if indicated):  Assets:  Communication Skills Desire for Improvement Resilience  ADL's:  Intact  Cognition: WNL  Sleep:      Treatment Plan Summary: Plan :Treatment Recommendations  -Increase Celexa to 40 mg PO daily for depression -Hold Trazodone for now due to sedation -Start Depakote ER 250 mg PO BID for mood stability -Check Vitamin D level  Disposition: Recommend psychiatric Inpatient admission when medically cleared. Supportive therapy provided about ongoing stressors.  Serena Colonel, FNP-BC [place] 07/04/2015 9:25 PM  Reviewed notes and agree with plan.

## 2015-07-04 NOTE — Progress Notes (Signed)
Patient has eluded to trying to leave again AMA.  Last time she did, she went into cardiac and respiratory arrests. She was just extubated this morning.  She fell back to sleep. She is not competent to make informed decision, in my opinion. Family and fiance concurred.  I requested IVC papers for these reason, but was told by [employer] that it cannot be served since she is a medical patient in the ICU.  I was told by [employer] that if she tries to leave within the next 24 hours, then she can be taken into custody and be brought to the ER.  I requested that this is verified with the [facility] in charge.  Houston Siren, MD.  Jerrel Ivory.

## 2015-07-04 NOTE — Progress Notes (Signed)
[facility] PROGRESS NOTE  Angles Trevizo ZOX:[id number] DOB: 09-09-1990    PCP:   No PCP Per Patient   HPI:  Becky [name] is an 25 y.o. female with hx of substance abuse, seen in ER, left AMA, returned via EMS with possible cardiac arrest, intubated subsequently and admitted to the ICU. UDS showed cocaine, THC, Benzo, and Opiates. She was seen by Dr Juanetta Gosling, and he added steroids. She is maintaining hemodynamic stability, agitated, and is requiring sedations. Fiance at bedside. She is now alert and understands what was being said.   Rewiew of Systems: Unable.   Past Medical History  Diagnosis Date  . Bronchitis   . Peptic ulcer   . Bipolar 1 disorder ([facility])   . History of substance abuse 06/16/2015  . Hepatitis C antibody test positive 06/17/2015  . Polysubstance abuse     Past Surgical History  Procedure Laterality Date  . Other surgical history      scar tissue removed from right ovary   . Other surgical history      fallopian tube repair  . Wisdom tooth extraction      Medications:  HOME MEDS: Prior to Admission medications   Medication Sig Start Date End Date Taking? Authorizing Provider  citalopram (CELEXA) 20 MG tablet Take 20 mg by mouth every morning.   Yes Historical Provider, MD  gabapentin (NEURONTIN) 600 MG tablet Take 600 mg by mouth 3 (three) times daily.   Yes Historical Provider, MD  levothyroxine (SYNTHROID, LEVOTHROID) 50 MCG tablet Take 50 mcg by mouth every morning.   Yes Historical Provider, MD  oxyCODONE (OXY IR/ROXICODONE) 5 MG immediate release tablet Take 1 tablet (5 mg total) by mouth every 8 (eight) hours as needed for severe pain. 06/17/15  Yes Elliot Cousin, MD  prenatal vitamin w/FE, FA (PRENATAL 1 + 1) 27-1 MG TABS tablet Take 1 tablet by mouth every morning.   Yes Historical Provider, MD  traZODone (DESYREL) 150 MG tablet Take 150 mg by mouth at bedtime.   Yes Historical Provider, MD  ondansetron (ZOFRAN) 4 MG tablet Take 1  tablet (4 mg total) by mouth every 6 (six) hours as needed for nausea. Patient not taking: Reported on 07/01/2015 06/17/15   Elliot Cousin, MD  pantoprazole (PROTONIX) 40 MG tablet Take 1 tablet (40 mg total) by mouth daily. Patient not taking: Reported on 07/01/2015 06/17/15   Elliot Cousin, MD     Allergies:  Allergies  Allergen Reactions  . Nsaids Other (See Comments)    Peptic ulcers   . Tylenol [Acetaminophen] Itching and Rash    Social History:   reports that she has been smoking Cigarettes.  She has a 10 pack-year smoking history. She has never used smokeless tobacco. She reports that she drinks alcohol. She reports that she uses illicit drugs (Marijuana, Methamphetamines, MDMA (Ecstacy), Oxycodone, and Benzodiazepines).  Family History: Family History  Problem Relation Age of Onset  . Cancer Other   . Drug abuse Brother      Physical Exam: Filed Vitals:   07/04/15 0400 07/04/15 0500 07/04/15 0600 07/04/15 0700  BP: 128/89 122/80 123/85 129/89  Pulse: 82 79 83 72  Temp: 98.7 F (37.1 C)     TempSrc: Axillary     Resp: Height:   (1.575 m)    Weight: 85.9 kg (189 lb 6 oz)     SpO2: 98% 97% 96% 98%   Blood pressure 129/89, pulse 72, temperature 98.7 F (37.1  C), temperature source Axillary, resp. rate 17, height  (1.575 m), weight 85.9 kg (189 lb 6 oz), last menstrual period 06/15/2015, SpO2 98 %.  GEN:  Pleasant  patient lying in the stretcher in no acute distress; cooperative with exam. PSYCH:  alert and oriented x4; does not appear anxious or depressed; affect is appropriate. HEENT: Mucous membranes pink and anicteric; PERRLA; EOM intact; no cervical lymphadenopathy nor thyromegaly or carotid bruit; no JVD; There were no stridor. Neck is very supple. Breasts:: Not examined CHEST WALL: No tenderness CHEST: intubated, mechanically ventilated.  HEART: Regular rate and rhythm.  There are no murmur, rub, or gallops.   BACK: No kyphosis or  scoliosis; no CVA tenderness ABDOMEN: soft and non-tender; no masses, no organomegaly, normal abdominal bowel sounds; no pannus; no intertriginous candida. There is no rebound and no distention. Rectal Exam: Not done EXTREMITIES: No bone or joint deformity; age-appropriate arthropathy of the hands and knees; no edema; no ulcerations.  There is no calf tenderness. Genitalia: not examined PULSES: 2+ and symmetric SKIN: Normal hydration no rash or ulceration CNS: Cranial nerves 2-12 grossly intact no focal lateralizing neurologic deficit.  Speech is fluent; uvula elevated with phonation, facial symmetry and tongue midline. DTR are normal bilaterally, cerebella exam is intact, barbinski is negative and strengths are equaled bilaterally.  No sensory loss.   Labs on Admission:  Basic Metabolic Panel:  Recent Labs Lab 07/01/15 2145 07/02/15 1232 07/03/15 0415  NA 138 139 141  K 4.1 5.6* 4.0  CL 104 105 112*  CO2 24 23 20*  GLUCOSE 85 125* 118*  BUN CREATININE 0.90 1.65* 1.13*  CALCIUM 8.5* 7.7* 7.3*   Liver Function Tests:  Recent Labs Lab 07/01/15 2145 07/02/15 1232 07/03/15 0415  AST 69* 70* 76*  ALT 283* 238* 157*  ALKPHOS 99 101 76  BILITOT 0.2* 0.5 0.6  PROT 6.7 6.5 4.9*  ALBUMIN 3.8 3.4* 2.5*    Recent Labs Lab 07/02/15 1232  AMMONIA 34   CBC:  Recent Labs Lab 07/01/15 2145 07/02/15 1232 07/03/15 0415  WBC 13.3* 16.0* 21.5*  NEUTROABS 9.6* 13.1*  --   HGB 12.3 11.8* 12.4  HCT 36.9 36.8 37.5  MCV 89.8 93.2 89.5  PLT 269 291 369    Radiological Exams on Admission: Ct Head Wo Contrast  07/02/2015  CLINICAL DATA:  Altered mental status EXAM: CT HEAD WITHOUT CONTRAST TECHNIQUE: Contiguous axial images were obtained from the base of the skull through the vertex without intravenous contrast. COMPARISON:  None. FINDINGS: Skull and Sinuses:Negative for fracture. Nasal cavity and nasopharynx fluid in the setting of intubation. Visualized orbits: Negative.  Brain: Unremarkable. No evidence of acute infarction, hemorrhage, hydrocephalus, or mass lesion/mass effect. IMPRESSION: Negative head CT. Electronically Signed   By: Marnee Spring M.D.   On: 07/02/2015 14:07   Portable Chest Xray  07/03/2015  CLINICAL DATA:  Ventilated patient, acute respiratory failure, sepsis, overdose, current smoker. EXAM: PORTABLE CHEST 1 VIEW COMPARISON:  Portable chest x-ray of July 02, 2015 FINDINGS: The lungs are mildly hypoinflated. Infiltrate in the left mid lung has improved. The costophrenic angles are sharp. Hazy density in the right infrahilar region is noted. The heart is mildly enlarged. The central pulmonary vascularity is prominent but more distinct today. The endotracheal tube tip lies approximately 3 cm above the carina. The esophagogastric tube tip projects below the inferior margin of the image. IMPRESSION: Improving infiltrate or atelectasis in the left perihilar region. Persistent mild cardiomegaly  with improving central pulmonary vascular congestion. Subtle right infrahilar interstitial density more conspicuous today may reflect atelectasis or less likely pneumonia. The support tubes are in reasonable position. Electronically Signed   By: David  [place] M.D.   On: 07/03/2015 07:29   Dg Chest Port 1 View  07/02/2015  CLINICAL DATA:  Overdose.  Status post intubation. EXAM: PORTABLE CHEST 1 VIEW COMPARISON:  07/01/2015 FINDINGS: Endotracheal tube tip projects 17 mm above the carina, well positioned. Oral/nasogastric tube passes below diaphragm. Tip is directed into the distal stomach. Hazy opacity projects over the left mid lung. This in part may be due to the supine rotated technique and overlying soft tissues. There is mild hazy opacity in the right perihilar region. Mild pulmonary edema is suspected. Cardiac silhouette is normal in size. IMPRESSION: 1. Endotracheal tube and nasal/orogastric tube are well positioned. 2. Mild pulmonary edema. Electronically  Signed   By: Amie Portland M.D.   On: 07/02/2015 12:30    Assessment/Plan Present on Admission:  . Overdose . Transaminitis  PLAN:Polysubstance abuse with respiratory failure, and possible cardiac arrest: The NEJM this month has an article shown that Fentanyl is being laced in other substances as the cost of Fentanyl in street production is quite low compared to other narcotics and heroine ( 3500.00 per Kg vs 80,000.00 per Kg). It is possible that there is Fentanyl in her drugs as well.I think she can be extubated today.  Will wean off meds and attempt extubation.  Fiance said she doesn't abuse alcohol.  Possible Seizure: Cocaine induced likely if true seizure. No ACD indicated.    Other plans as per orders. Code Status: FULL Unk Lightning, MD.  FACP [facility] Pager [phone] 7pm to 7am.  07/04/2015, 7:53 AM

## 2015-07-05 DIAGNOSIS — T50901D Poisoning by unspecified drugs, medicaments and biological substances, accidental (unintentional), subsequent encounter: Secondary | ICD-10-CM

## 2015-07-05 LAB — URINE CULTURE

## 2015-07-05 MED ORDER — PREDNISONE 10 MG (21) PO TBPK: ORAL_TABLET | ORAL | Status: DC

## 2015-07-05 MED ORDER — AMOXICILLIN-POT CLAVULANATE 875-125 MG PO TABS: 1.0000 | ORAL_TABLET | Freq: Two times a day (BID) | ORAL | Status: DC

## 2015-07-05 NOTE — Progress Notes (Signed)
Patient is requesting to leave AMA. She is fully competent today, and despite encouragement to stay and receiving more treatment, she insisted on leaving. I cannot hold her today, as she is capable of making an informed decision. AMA paper signed, [employer] was notified that she is competent to sign out AMA. She was given Prednisone and Augmentin as recommended by Dr Juanetta Gosling.     Houston Siren, MD.  [facility]. Hospital Medicine.

## 2015-07-05 NOTE — Progress Notes (Signed)
Subjective: She is doing better. She was able to be extubated successfully yesterday. She had psychiatry consult by remote consultation. She says she is still short of breath. She is still coughing. Her cough is productive of greenish gray sputum  Objective: Vital signs in last 24 hours: Temp:  [98 F (36.7 C)-98.7 F (37.1 C)] 98.7 F (37.1 C) (02/19 0400) Pulse Rate:  [44-99] 44 (02/19 0700) Resp:  [17-27] 21 (02/19 0700) BP: (82-147)/(53-94) 110/66 mmHg (02/19 0700) SpO2:  [85 %-100 %] 96 % (02/19 0743) FiO2 (%):  [21 %-40 %] 21 % (02/18 1859) Weight change:  Last BM Date: 07/04/15  Intake/Output from previous day: 02/18 0701 - 02/19 0700 In: 3930 [P.O.:480; I.V.:3450] Out: -   PHYSICAL EXAM General appearance: alert and no distress Resp: rhonchi bilaterally Cardio: regular rate and rhythm, S1, S2 normal, no murmur, click, rub or gallop GI: soft, non-tender; bowel sounds normal; no masses,  no organomegaly Extremities: extremities normal, atraumatic, no cyanosis or edema  Lab Results:  Results for orders placed or performed during the hospital encounter of 07/02/15 (from the past 48 hour(s))  Lactic acid, plasma     Status: None   Collection Time: 07/03/15  9:27 AM  Result Value Ref Range   Lactic Acid, Venous 1.7 0.5 - 2.0 mmol/L  Lactic acid, plasma     Status: None   Collection Time: 07/03/15 12:10 PM  Result Value Ref Range   Lactic Acid, Venous 1.6 0.5 - 2.0 mmol/L  TSH     Status: Abnormal   Collection Time: 07/03/15  6:37 PM  Result Value Ref Range   TSH 0.191 (L) 0.350 - 4.500 uIU/mL  Blood gas, arterial     Status: Abnormal   Collection Time: 07/04/15  5:00 AM  Result Value Ref Range   FIO2 0.40    Delivery systems VENTILATOR    Mode PRESSURE REGULATED VOLUME CONTROL    VT 480 mL   LHR 22 resp/min   Peep/cpap 5.0 cm H20   pH, Arterial 7.523 (H) 7.350 - 7.450   pCO2 arterial 22.3 (L) 35.0 - 45.0 mmHg   pO2, Arterial 122.0 (H) 80.0 - 100.0 mmHg    Bicarbonate 22.0 20.0 - 24.0 mEq/L   Acid-base deficit 4.2 (H) 0.0 - 2.0 mmol/L   O2 Saturation 98.7 %   Collection site RADIAL    Drawn by [id number]    Sample type ARTERIAL    Allens test (pass/fail) PASS PASS    ABGS  Recent Labs  07/03/15 0356 07/04/15 0500  PHART 7.417 7.523*  PO2ART 93.3 122.0*  TCO2 16.9  --   HCO3 20.7 22.0   CULTURES Recent Results (from the past 240 hour(s))  Culture, blood (routine x 2)     Status: None (Preliminary result)   Collection Time: 07/02/15 12:30 PM  Result Value Ref Range Status   Specimen Description BLOOD RIGHT ARM  Final   Special Requests BOTTLES DRAWN AEROBIC AND ANAEROBIC 6CC EACH  Final   Culture NO GROWTH 3 DAYS  Final   Report Status PENDING  Incomplete  Urine culture     Status: None (Preliminary result)   Collection Time: 07/02/15 12:45 PM  Result Value Ref Range Status   Specimen Description URINE, CATHETERIZED  Final   Special Requests NONE  Final   Culture   Final    CULTURE REINCUBATED FOR BETTER GROWTH Performed at [facility]    Report Status PENDING  Incomplete  Culture, blood (routine x 2)  Status: None (Preliminary result)   Collection Time: 07/02/15  5:45 PM  Result Value Ref Range Status   Specimen Description BLOOD LEFT HAND  Final   Special Requests BOTTLES DRAWN AEROBIC ONLY 4CC  Final   Culture NO GROWTH 3 DAYS  Final   Report Status PENDING  Incomplete  MRSA PCR Screening     Status: Abnormal   Collection Time: 07/02/15  8:40 PM  Result Value Ref Range Status   MRSA by PCR POSITIVE (A) NEGATIVE Final    Comment:        The GeneXpert MRSA Assay (FDA approved for NASAL specimens only), is one component of a comprehensive MRSA colonization surveillance program. It is not intended to diagnose MRSA infection nor to guide or monitor treatment for MRSA infections. CRITICAL RESULT CALLED TO, READ BACK BY AND VERIFIED WITH: STOPFUL,A AT 0539 ON 07/03/2015 BY WOODS, M     Studies/Results: Dg Chest Port 1 View  07/04/2015  CLINICAL DATA:  Shortness of breath. EXAM: PORTABLE CHEST 1 VIEW COMPARISON:  Yesterday. FINDINGS: Endotracheal tube in satisfactory position. Nasogastric tube extending into the stomach. Stable enlarged cardiac silhouette. Mild increase in prominence of the pulmonary vasculature and interstitial markings. Probable small bilateral pleural effusions. IMPRESSION: Mild worsening of mild congestive heart failure. Electronically Signed   By: Beckie Salts M.D.   On: 07/04/2015 09:13    Medications:  Prior to Admission:  Prescriptions prior to admission  Medication Sig Dispense Refill Last Dose  . citalopram (CELEXA) 20 MG tablet Take 20 mg by mouth every morning.   Past Week at Unknown time  . gabapentin (NEURONTIN) 600 MG tablet Take 600 mg by mouth 3 (three) times daily.   Past Week at Unknown time  . levothyroxine (SYNTHROID, LEVOTHROID) 50 MCG tablet Take 50 mcg by mouth every morning.   Past Week at Unknown time  . oxyCODONE (OXY IR/ROXICODONE) 5 MG immediate release tablet Take 1 tablet (5 mg total) by mouth every 8 (eight) hours as needed for severe pain. 30 tablet 0 07/01/2015 at Unknown time  . prenatal vitamin w/FE, FA (PRENATAL 1 + 1) 27-1 MG TABS tablet Take 1 tablet by mouth every morning.   Past Week at Unknown time  . traZODone (DESYREL) 150 MG tablet Take 150 mg by mouth at bedtime.   Past Week at Unknown time  . ondansetron (ZOFRAN) 4 MG tablet Take 1 tablet (4 mg total) by mouth every 6 (six) hours as needed for nausea. (Patient not taking: Reported on 07/01/2015) 20 tablet 0   . pantoprazole (PROTONIX) 40 MG tablet Take 1 tablet (40 mg total) by mouth daily. (Patient not taking: Reported on 07/01/2015) 30 tablet 2    Scheduled: . chlorhexidine gluconate  15 mL Mouth Rinse BID  . Chlorhexidine Gluconate Cloth  6 each Topical Q0600  . heparin  5,000 Units Subcutaneous 3 times per day  . levalbuterol  0.63 mg Nebulization Q6H  .  methylPREDNISolone (SOLU-MEDROL) injection  40 mg Intravenous Q12H  . mupirocin ointment  1 application Nasal BID  . nicotine  14 mg Transdermal Daily  . pantoprazole (PROTONIX) IV  40 mg Intravenous Q24H  . piperacillin-tazobactam (ZOSYN)  IV  3.375 g Intravenous Q8H  . vancomycin  1,000 mg Intravenous Q12H   Continuous: . sodium chloride    . sodium chloride 150 mL/hr at 07/05/15 0600   ZOX:WRUEAVWU (SUBLIMAZE) injection, LORazepam, LORazepam, ondansetron **OR** ondansetron (ZOFRAN) IV  Assesment: She had acute respiratory failure from a drug overdose.  She is much improved. At baseline she has some element of asthma/chronic bronchitis/possible COPD despite her young age. She is currently being treated for probable pneumonia considering her overdose. She has had elevated white blood cell count. Chest x-ray is not normal but does not show a lobar infiltrate. Active Problems:   Transaminitis   History of substance abuse   Overdose   Lactic acidosis   Acute respiratory failure (HCC)   Sepsis (HCC)   AKI (acute kidney injury) (HCC)   Altered mental status   Pyrexia   Bipolar I disorder, most recent episode depressed (HCC)    Plan: Continue current treatment. I think is probably okay to switch her to oral antibiotics and prednisone. I would choose Augmentin.    LOS: 3 days   Demaria Deeney L 07/05/2015, 9:08 AM

## 2015-07-05 NOTE — Discharge Summary (Signed)
Physician Discharge Summary  Becky [name] ZOX:[id number] DOB: 02-03-1991 DOA: 07/02/2015  PCP: No PCP Per Patient  Admit date: 07/02/2015 Discharge date: 07/05/2015  Time spent: 35 minutes  Recommendations for Outpatient Follow-up:  1. Do not use drug. 2. Stay with parents. 3. Follow up with PCP so inpatient rehab can be arranged.   Discharge Diagnoses:  Active Problems:   Transaminitis   History of substance abuse   Overdose   Lactic acidosis   Acute respiratory failure (HCC)   Sepsis (HCC)   AKI (acute kidney injury) (HCC)   Altered mental status   Pyrexia   Bipolar I disorder, most recent episode depressed (HCC)   Discharge Condition: improved, but treatment was incomplete.  Competent.  Left AMA.   Filed Weights   07/02/15 2035 07/03/15 0500 07/04/15 0400  Weight: 81.9 kg (180 lb 8.9 oz) 85.3 kg (188 lb 0.8 oz) 85.9 kg (189 lb 6 oz)    History of present illness: Patietnt was admitted by Dr Evelena Peat on Jul 02, 2015 for respiratory arrest, possibly cardiac arrest, for polysubstance abuse and overdose.  As per his H and P:  " is a 25 y.o. female Level 5 caveat: Patient presenting obtunded secondary to drug overdose. History provided by EDP. No family available at time of examination. The patella patient presented to the ED on the day prior additionally with drug overdose complaints but left AMA. Since that time patient has been closely monitored by family members. States that they have not seen her do any additional drugs since that time. However, patient was found unresponsive this morning on the floor at her home. At time of EMS arrival family members were doing compressions on the patient. Patient maintains respirations on her own.. EMS noted a rapid heart rate and posturing with clonus of the left hand and wrist. Patient was given 2 mg of Ativan en route to independent hospital.  In the ED patient decompensated and was intubated.    Hospital Course: Becky [name] is  an 25 y.o. female with hx of substance abuse, seen in ER, left AMA, returned via EMS with possible cardiac arrest, intubated subsequently and admitted to the ICU. UDS showed cocaine, THC, Benzo, and Opiates. She was seen by Dr Juanetta Gosling, and he added steroids. She is maintaining hemodynamic stability, agitated, and is requiring sedations. Fiance at bedside. She was subsequently extubated, and immediately wanted to leave AMA.  She was n't competent to leave that day, and IVC paper was filed, but [employer] said it cannot be serve when patient was admitted in the ICU.  She would be taken under custody however, if she was to leave that day.  The following day, however, she became more alert, and competent to make her decision.  She wanted to leave AMA, and this time, despite much encouragement, she insisted.  Her family and her fiance knows her intention and that she cannot be kept any longer against her will.  Prednisone and Augmentin were both given to her to complete her tx.  She was encouraged to stay with her love ones, not to take any drugs, and to sign herself into an inpatient rehab.  She was told that if she continues to use drugs, there is a real possibility that she may die.    Consultations:  Dr Juanetta Gosling of [facility]  Discharge Exam: Filed Vitals:   07/05/15 0600 07/05/15 0700  BP: 120/81 110/66  Pulse: 46 44  Temp:    Resp: 18 21  Discharge Instructions    Discharge Medication List as of 07/05/2015 11:34 AM    START taking these medications   Details  amoxicillin-clavulanate (AUGMENTIN) 875-125 MG tablet Take 1 tablet by mouth 2 (two) times daily., Starting 07/05/2015, Until Discontinued, Print    predniSONE (STERAPRED UNI-PAK 21 TAB) 10 MG (21) TBPK tablet Use as directed, Print      CONTINUE these medications which have NOT CHANGED   Details  citalopram (CELEXA) 20 MG tablet Take 20 mg by mouth every morning., Until Discontinued, Historical Med     levothyroxine (SYNTHROID, LEVOTHROID) 50 MCG tablet Take 50 mcg by mouth every morning., Until Discontinued, Historical Med      STOP taking these medications     gabapentin (NEURONTIN) 600 MG tablet      oxyCODONE (OXY IR/ROXICODONE) 5 MG immediate release tablet      prenatal vitamin w/FE, FA (PRENATAL 1 + 1) 27-1 MG TABS tablet      traZODone (DESYREL) 150 MG tablet      ondansetron (ZOFRAN) 4 MG tablet      pantoprazole (PROTONIX) 40 MG tablet        Allergies  Allergen Reactions  . Nsaids Other (See Comments)    Peptic ulcers   . Tylenol [Acetaminophen] Itching and Rash      The results of significant diagnostics from this hospitalization (including imaging, microbiology, ancillary and laboratory) are listed below for reference.    Significant Diagnostic Studies: Ct Head Wo Contrast  07/02/2015  CLINICAL DATA:  Altered mental status EXAM: CT HEAD WITHOUT CONTRAST TECHNIQUE: Contiguous axial images were obtained from the base of the skull through the vertex without intravenous contrast. COMPARISON:  None. FINDINGS: Skull and Sinuses:Negative for fracture. Nasal cavity and nasopharynx fluid in the setting of intubation. Visualized orbits: Negative. Brain: Unremarkable. No evidence of acute infarction, hemorrhage, hydrocephalus, or mass lesion/mass effect. IMPRESSION: Negative head CT. Electronically Signed   By: Marnee Spring M.D.   On: 07/02/2015 14:07   US Abdomen Complete  06/16/2015  CLINICAL DATA:  Hepatitis-C, elevated LFTs, worsening abdominal pain, smoker EXAM: ABDOMEN ULTRASOUND COMPLETE COMPARISON:  CT abdomen and pelvis 09/04/2013 FINDINGS: Gallbladder: Normal distend without stones or wall thickening. No pericholecystic fluid or sonographic Murphy sign. Common bile duct: Diameter: Normal caliber 4 mm diameter Liver: Normal appearance IVC: Normal appearance Pancreas: Tail obscured by bowel gas. Visualized portion normal appearance. Spleen: Normal appearance, 9.5  cm length Right Kidney: Length: 11.7 cm. Normal morphology without mass or hydronephrosis. Left Kidney: Length: 11.8 cm. Normal morphology without mass or hydronephrosis. Abdominal aorta: Normal caliber Other findings: No free fluid IMPRESSION: Incomplete visualization of pancreatic tail. Otherwise normal exam. Electronically Signed   By: Ulyses Southward M.D.   On: 06/16/2015 11:45   Dg Chest Port 1 View  07/04/2015  CLINICAL DATA:  Shortness of breath. EXAM: PORTABLE CHEST 1 VIEW COMPARISON:  Yesterday. FINDINGS: Endotracheal tube in satisfactory position. Nasogastric tube extending into the stomach. Stable enlarged cardiac silhouette. Mild increase in prominence of the pulmonary vasculature and interstitial markings. Probable small bilateral pleural effusions. IMPRESSION: Mild worsening of mild congestive heart failure. Electronically Signed   By: Beckie Salts M.D.   On: 07/04/2015 09:13   Portable Chest Xray  07/03/2015  CLINICAL DATA:  Ventilated patient, acute respiratory failure, sepsis, overdose, current smoker. EXAM: PORTABLE CHEST 1 VIEW COMPARISON:  Portable chest x-ray of July 02, 2015 FINDINGS: The lungs are mildly hypoinflated. Infiltrate in the left mid lung has improved. The costophrenic angles  are sharp. Hazy density in the right infrahilar region is noted. The heart is mildly enlarged. The central pulmonary vascularity is prominent but more distinct today. The endotracheal tube tip lies approximately 3 cm above the carina. The esophagogastric tube tip projects below the inferior margin of the image. IMPRESSION: Improving infiltrate or atelectasis in the left perihilar region. Persistent mild cardiomegaly with improving central pulmonary vascular congestion. Subtle right infrahilar interstitial density more conspicuous today may reflect atelectasis or less likely pneumonia. The support tubes are in reasonable position. Electronically Signed   By: David  [place] M.D.   On: 07/03/2015 07:29   Dg  Chest Port 1 View  07/02/2015  CLINICAL DATA:  Overdose.  Status post intubation. EXAM: PORTABLE CHEST 1 VIEW COMPARISON:  07/01/2015 FINDINGS: Endotracheal tube tip projects 17 mm above the carina, well positioned. Oral/nasogastric tube passes below diaphragm. Tip is directed into the distal stomach. Hazy opacity projects over the left mid lung. This in part may be due to the supine rotated technique and overlying soft tissues. There is mild hazy opacity in the right perihilar region. Mild pulmonary edema is suspected. Cardiac silhouette is normal in size. IMPRESSION: 1. Endotracheal tube and nasal/orogastric tube are well positioned. 2. Mild pulmonary edema. Electronically Signed   By: Amie Portland M.D.   On: 07/02/2015 12:30   Dg Chest Port 1 View  07/01/2015  CLINICAL DATA:  25 year old female with unresponsiveness EXAM: PORTABLE CHEST 1 VIEW COMPARISON:  Radiograph dated 02/09/2014 FINDINGS: Single portable view of the chest demonstrates poor inspiratory effort with low lung volumes. There is mild diffuse interstitial prominence which may represent a degree of congestion versus edema. Superimposed pneumonia is not excluded. No focal consolidation, pleural effusion, or pneumothorax. Top-normal cardiac size. No acute osseous pathology. IMPRESSION: Mild central vascular and interstitial prominence may represent a degree of congestion versus edema. Clinical correlation is recommended. Electronically Signed   By: Elgie Collard M.D.   On: 07/01/2015 21:00    Microbiology: Recent Results (from the past 240 hour(s))  Culture, blood (routine x 2)     Status: None (Preliminary result)   Collection Time: 07/02/15 12:30 PM  Result Value Ref Range Status   Specimen Description BLOOD RIGHT ARM  Final   Special Requests BOTTLES DRAWN AEROBIC AND ANAEROBIC 6CC EACH  Final   Culture NO GROWTH 3 DAYS  Final   Report Status PENDING  Incomplete  Urine culture     Status: None   Collection Time: 07/02/15 12:45  PM  Result Value Ref Range Status   Specimen Description URINE, CATHETERIZED  Final   Special Requests NONE  Final   Culture   Final    >=100,000 COLONIES/mL ENTEROCOCCUS SPECIES Performed at [facility]    Report Status 07/05/2015 FINAL  Final   Organism ID, Bacteria ENTEROCOCCUS SPECIES  Final      Susceptibility   Enterococcus species - MIC*    AMPICILLIN <=2 SENSITIVE Sensitive     LEVOFLOXACIN 1 SENSITIVE Sensitive     NITROFURANTOIN <=16 SENSITIVE Sensitive     VANCOMYCIN 2 SENSITIVE Sensitive     * >=100,000 COLONIES/mL ENTEROCOCCUS SPECIES  Culture, blood (routine x 2)     Status: None (Preliminary result)   Collection Time: 07/02/15  5:45 PM  Result Value Ref Range Status   Specimen Description BLOOD LEFT HAND  Final   Special Requests BOTTLES DRAWN AEROBIC ONLY 4CC  Final   Culture NO GROWTH 3 DAYS  Final   Report Status PENDING  Incomplete  MRSA PCR Screening     Status: Abnormal   Collection Time: 07/02/15  8:40 PM  Result Value Ref Range Status   MRSA by PCR POSITIVE (A) NEGATIVE Final    Comment:        The GeneXpert MRSA Assay (FDA approved for NASAL specimens only), is one component of a comprehensive MRSA colonization surveillance program. It is not intended to diagnose MRSA infection nor to guide or monitor treatment for MRSA infections. CRITICAL RESULT CALLED TO, READ BACK BY AND VERIFIED WITH: STOPFUL,A AT 0539 ON 07/03/2015 BY WOODS, Judie Petit      Labs: Basic Metabolic Panel:  Recent Labs Lab 07/01/15 2145 07/02/15 1232 07/03/15 0415  NA 138 139 141  K 4.1 5.6* 4.0  CL 104 105 112*  CO2 24 23 20*  GLUCOSE 85 125* 118*  BUN 8 10 12   CREATININE 0.90 1.65* 1.13*  CALCIUM 8.5* 7.7* 7.3*   Liver Function Tests:  Recent Labs Lab 07/01/15 2145 07/02/15 1232 07/03/15 0415  AST 69* 70* 76*  ALT 283* 238* 157*  ALKPHOS 99 101 76  BILITOT 0.2* 0.5 0.6  PROT 6.7 6.5 4.9*  ALBUMIN 3.8 3.4* 2.5*   No results for input(s): LIPASE,  AMYLASE in the last 168 hours.  Recent Labs Lab 07/02/15 1232  AMMONIA 34   CBC:  Recent Labs Lab 07/01/15 2145 07/02/15 1232 07/03/15 0415  WBC 13.3* 16.0* 21.5*  NEUTROABS 9.6* 13.1*  --   HGB 12.3 11.8* 12.4  HCT 36.9 36.8 37.5  MCV 89.8 93.2 89.5  PLT 269 291 369    Signed:  Riverlyn Kizziah MD.  [facility] 07/05/2015, 8:31 PM

## 2015-07-07 ENCOUNTER — Encounter (HOSPITAL_COMMUNITY): Payer: Self-pay

## 2015-07-07 ENCOUNTER — Emergency Department (HOSPITAL_COMMUNITY)
Admission: EM | Admit: 2015-07-07 | Discharge: 2015-07-07 | Discharge status: Against Medical Advice | Payer: Self-pay | Attending: Emergency Medicine | Admitting: Emergency Medicine

## 2015-07-07 ENCOUNTER — Emergency Department (HOSPITAL_COMMUNITY): Payer: Self-pay

## 2015-07-07 DIAGNOSIS — R197 Diarrhea, unspecified: Secondary | ICD-10-CM | POA: Insufficient documentation

## 2015-07-07 DIAGNOSIS — R1110 Vomiting, unspecified: Secondary | ICD-10-CM | POA: Insufficient documentation

## 2015-07-07 DIAGNOSIS — R109 Unspecified abdominal pain: Secondary | ICD-10-CM | POA: Insufficient documentation

## 2015-07-07 DIAGNOSIS — J690 Pneumonitis due to inhalation of food and vomit: Secondary | ICD-10-CM | POA: Insufficient documentation

## 2015-07-07 DIAGNOSIS — Z8711 Personal history of peptic ulcer disease: Secondary | ICD-10-CM | POA: Insufficient documentation

## 2015-07-07 DIAGNOSIS — F17210 Nicotine dependence, cigarettes, uncomplicated: Secondary | ICD-10-CM | POA: Insufficient documentation

## 2015-07-07 DIAGNOSIS — F319 Bipolar disorder, unspecified: Secondary | ICD-10-CM | POA: Insufficient documentation

## 2015-07-07 LAB — CULTURE, BLOOD (ROUTINE X 2)
CULTURE: NO GROWTH
Culture: NO GROWTH

## 2015-07-07 MED ORDER — SODIUM CHLORIDE 0.9 % IV BOLUS (SEPSIS): 1000.0000 mL | Freq: Once | INTRAVENOUS | Status: DC

## 2015-07-07 MED ORDER — ONDANSETRON HCL 4 MG/2ML IJ SOLN: 4.0000 mg | Freq: Once | INTRAMUSCULAR | Status: DC

## 2015-07-07 MED ORDER — SODIUM CHLORIDE 0.9 % IV SOLN: 3.0000 g | Freq: Once | INTRAVENOUS | Status: DC

## 2015-07-07 MED FILL — Medication: Qty: 1 | Status: AC

## 2015-07-07 NOTE — ED Notes (Signed)
Pt left and refusing to be seen

## 2015-07-07 NOTE — ED Provider Notes (Signed)
CSN: [id number]     Arrival date & time 07/07/15  1138 History  By signing my name below, I, Becky [name], attest that this documentation has been prepared under the direction and in the presence of Becky Rhine, MD. Electronically Signed: Ronney [name], ED Scribe. 07/07/2015. 12:43 PM.    Chief Complaint  Patient presents with  . Pneumonia    The history is provided by the patient and a relative. No language interpreter was used.    HPI Comments: Becky [name] is a 25 y.o. female with a history of polysubstance abuse, bipolar 1 disorder, and bronchitis, who presents to the Emergency Department with multiple complaints, noting a recent diagnosis of pneumonia and withdrawals from heroin that began 3 days ago. She complains of cough with hemoptysis, fever, SOB, vomiting, diarrhea, abdominal pain, and generalized myalgias. Per father, patient was admitted to the ICU on 07/04/15, about 3 days ago, for a heroin overdose. He states she was intubated at that time and left AMA. Patient states she returns today because the hospital had called her to inform her had pneumonia. Patient states she is not currently on antibiotics. She states she has not used heroin since she last left the hospital. She states she injects heroin, which she last used 3 days ago, and feels like she is going through withdrawals. She denies a history of any chronic medical conditions or any recent travel.  Past Medical History  Diagnosis Date  . Bronchitis   . Peptic ulcer   . Bipolar 1 disorder (HCC)   . History of substance abuse 06/16/2015  . Hepatitis C antibody test positive 06/17/2015  . Polysubstance abuse    Past Surgical History  Procedure Laterality Date  . Other surgical history      scar tissue removed from right ovary   . Other surgical history      fallopian tube repair  . Wisdom tooth extraction     Family History  Problem Relation Age of Onset  . Cancer Other   . Drug abuse Brother    Social History   Substance Use Topics  . Smoking status: Current Every Day Smoker -- 1.00 packs/day for 10 years    Types: Cigarettes  . Smokeless tobacco: Never Used  . Alcohol Use: Yes     Comment: occ   OB History    Gravida Para Term Preterm AB TAB SAB Ectopic Multiple Living   Review of Systems  Constitutional: Positive for fever.  Respiratory: Positive for cough (hemoptysis) and shortness of breath.   Gastrointestinal: Positive for vomiting, abdominal pain and diarrhea.  Musculoskeletal: Positive for myalgias (generalized).  All other systems reviewed and are negative.  Allergies  Nsaids and Tylenol  Home Medications   Prior to Admission medications   Medication Sig Start Date End Date Taking? Authorizing Provider  citalopram (CELEXA) 20 MG tablet Take 20 mg by mouth every morning.   Yes Historical Provider, MD  levothyroxine (SYNTHROID, LEVOTHROID) 50 MCG tablet Take 50 mcg by mouth every morning.   Yes Historical Provider, MD  amoxicillin-clavulanate (AUGMENTIN) 875-125 MG tablet Take 1 tablet by mouth 2 (two) times daily. Patient not taking: Reported on 07/07/2015 07/05/15   Houston Siren, MD  predniSONE (STERAPRED UNI-PAK 21 TAB) 10 MG (21) TBPK tablet Use as directed Patient not taking: Reported on 07/07/2015 07/05/15   Houston Siren, MD   BP 161/82 mmHg  Pulse 51  Temp(Src) 97.8  F (36.6 C) (Oral)  Resp 18  Ht  (1.575 m)  Wt 170 lb (77.111 kg)  BMI 31.09 kg/m2  SpO2 100%  LMP 06/19/2015 Physical Exam  Nursing note and vitals reviewed. CONSTITUTIONAL: Well developed/well nourished HEAD: Normocephalic/atraumatic EYES: EOMI/PERRL ENMT: Mucous membranes dry NECK: supple no meningeal signs SPINE/BACK:entire spine nontender CV: S1/S2 noted, no murmurs/rubs/gallops noted LUNGS: decreased breath sounds bilaterally; mild tachypnea noted ABDOMEN: soft, nontender, no rebound or guarding, bowel sounds noted throughout abdomen GU:no cva tenderness NEURO: Pt is  awake/alert/appropriate, moves all extremitiesx4.  No facial droop.   EXTREMITIES: pulses normal/equal, full ROM; bruising to both arms, but no signs of abscess or cellulitis SKIN: warm, color normal PSYCH: no abnormalities of mood noted, alert and oriented to situation   ED Course  Procedures   DIAGNOSTIC STUDIES: Oxygen Saturation is 100% on RA, normal by my interpretation.    COORDINATION OF CARE: 12:38 PM - CXR findings reviewed with pt. Discussed treatment plan with pt and her father at bedside which includes lab tests and hospital admission. Discussed with pt that leaving pneumonia untreated could result in death. Pt states she does not wish to stay for the day and adds, "The addiction will kill me before the pneumonia will." Encouraged pt to discuss decision with her father.  Pt initially decided to stay. We agreed to start IV antibiotics and admit as this infection is worsening  Soon after I was informed patient left the premises.  She is aware that this infection could kill her and is aware her addiction may kill her   Imaging Review Dg Chest 2 View  07/07/2015  CLINICAL DATA:  Overdose, intubation, LEFT hospital AMA on Sunday, pneumonia, cough, shortness of breath, fever EXAM: CHEST  2 VIEW COMPARISON:  07/06/2015 FINDINGS: Enlargement of cardiac silhouette. Mediastinal contours and pulmonary vascularity normal. Small to moderate RIGHT pleural effusion and basilar atelectasis. Remaining lungs clear. Questionable 8 mm diameter nodular density at RIGHT lung base though this was not definitely visual distal any of the previous exams. No pneumothorax. Bones unremarkable. IMPRESSION: Persistent small to moderate RIGHT pleural effusion and basilar atelectasis. Questionable 8 mm nodular density versus atelectasis at RIGHT base ; followup chest radiographs recommended in 2-3 weeks following resolution of patient's cute acute process to ensure resolution and exclude pulmonary nodule. If  follow-up cannot be assured, consider CT instead. Electronically Signed   By: Ulyses Southward M.D.   On: 07/07/2015 12:36   I have personally reviewed and evaluated these images results as part of my medical decision-making.    MDM   Final diagnoses:  Aspiration pneumonia of right lower lobe, unspecified aspiration pneumonia type [facility])    Nursing notes including past medical history and social history reviewed and considered in documentation xrays/imaging reviewed by myself and considered during evaluation Previous records reviewed and considered   I personally performed the services described in this documentation, which was scribed in my presence. The recorded information has been reviewed and is accurate.       Becky Rhine, MD 07/07/15 1332

## 2015-07-07 NOTE — ED Notes (Signed)
Pt denies SI but says she wants help getting off of drugs so she can get her son back from social services.

## 2015-07-07 NOTE — ED Notes (Signed)
Per father pt states she is going to leave.  Asked pt and she states she will stay here for treatment.

## 2015-07-07 NOTE — ED Notes (Signed)
Pt reports she was admitted to ICU for OD and was intubated.  Pt left the hospital [facility] Sunday.  Pt says she went to [facility] ER yesterday for detox but says they couldn't do detox there so she left.  Pt says they called her on her way home and told her that she has pneumonia.  Pt reports SOB, cough, and fever.

## 2015-07-08 ENCOUNTER — Emergency Department (HOSPITAL_COMMUNITY): Payer: Self-pay

## 2015-07-08 ENCOUNTER — Encounter (HOSPITAL_COMMUNITY): Payer: Self-pay | Admitting: *Deleted

## 2015-07-08 ENCOUNTER — Emergency Department (HOSPITAL_COMMUNITY)
Admission: EM | Admit: 2015-07-08 | Discharge: 2015-07-08 | Disposition: A | Discharge status: Home / Self Care | Payer: Self-pay | Attending: Emergency Medicine | Admitting: Emergency Medicine

## 2015-07-08 DIAGNOSIS — Z9889 Other specified postprocedural states: Secondary | ICD-10-CM | POA: Insufficient documentation

## 2015-07-08 DIAGNOSIS — F17210 Nicotine dependence, cigarettes, uncomplicated: Secondary | ICD-10-CM | POA: Insufficient documentation

## 2015-07-08 DIAGNOSIS — E876 Hypokalemia: Secondary | ICD-10-CM | POA: Insufficient documentation

## 2015-07-08 DIAGNOSIS — B1920 Unspecified viral hepatitis C without hepatic coma: Secondary | ICD-10-CM | POA: Insufficient documentation

## 2015-07-08 DIAGNOSIS — Z8709 Personal history of other diseases of the respiratory system: Secondary | ICD-10-CM | POA: Insufficient documentation

## 2015-07-08 DIAGNOSIS — F319 Bipolar disorder, unspecified: Secondary | ICD-10-CM | POA: Insufficient documentation

## 2015-07-08 DIAGNOSIS — Z8719 Personal history of other diseases of the digestive system: Secondary | ICD-10-CM | POA: Insufficient documentation

## 2015-07-08 DIAGNOSIS — J90 Pleural effusion, not elsewhere classified: Secondary | ICD-10-CM | POA: Insufficient documentation

## 2015-07-08 DIAGNOSIS — Z91419 Personal history of unspecified adult abuse: Secondary | ICD-10-CM | POA: Insufficient documentation

## 2015-07-08 LAB — BASIC METABOLIC PANEL
ANION GAP: 14 (ref 5–15)
BUN: 6 mg/dL (ref 6–20)
CALCIUM: 8.8 mg/dL — AB (ref 8.9–10.3)
CO2: 30 mmol/L (ref 22–32)
CREATININE: 0.79 mg/dL (ref 0.44–1.00)
Chloride: 101 mmol/L (ref 101–111)
Glucose, Bld: 106 mg/dL — ABNORMAL HIGH (ref 65–99)
Potassium: 2.5 mmol/L — CL (ref 3.5–5.1)
SODIUM: 145 mmol/L (ref 135–145)

## 2015-07-08 LAB — CBC WITH DIFFERENTIAL/PLATELET
BASOS ABS: 0 10*3/uL (ref 0.0–0.1)
BASOS PCT: 0 %
EOS ABS: 0.3 10*3/uL (ref 0.0–0.7)
EOS PCT: 3 %
HEMATOCRIT: 37 % (ref 36.0–46.0)
Hemoglobin: 12.8 g/dL (ref 12.0–15.0)
Lymphocytes Relative: 32 %
Lymphs Abs: 3.6 10*3/uL (ref 0.7–4.0)
MCH: 29.4 pg (ref 26.0–34.0)
MCHC: 34.6 g/dL (ref 30.0–36.0)
MCV: 85.1 fL (ref 78.0–100.0)
MONO ABS: 0.8 10*3/uL (ref 0.1–1.0)
Monocytes Relative: 7 %
NEUTROS ABS: 6.6 10*3/uL (ref 1.7–7.7)
Neutrophils Relative %: 58 %
PLATELETS: 335 10*3/uL (ref 150–400)
RBC: 4.35 MIL/uL (ref 3.87–5.11)
RDW: 13.6 % (ref 11.5–15.5)
WBC: 11.3 10*3/uL — ABNORMAL HIGH (ref 4.0–10.5)

## 2015-07-08 MED ORDER — SODIUM CHLORIDE 0.9 % IV BOLUS (SEPSIS)
1000.0000 mL | Freq: Once | INTRAVENOUS | Status: AC
  Administered 2015-07-08: 1000 mL via INTRAVENOUS

## 2015-07-08 MED ORDER — POTASSIUM CHLORIDE CRYS ER 20 MEQ PO TBCR
40.0000 meq | EXTENDED_RELEASE_TABLET | Freq: Once | ORAL | Status: AC
  Administered 2015-07-08: 40 meq via ORAL
  Filled 2015-07-08: qty 2

## 2015-07-08 MED ORDER — POTASSIUM CHLORIDE CRYS ER 20 MEQ PO TBCR: 20.0000 meq | EXTENDED_RELEASE_TABLET | Freq: Two times a day (BID) | ORAL | Status: DC

## 2015-07-08 MED ORDER — AZITHROMYCIN 250 MG PO TABS: ORAL_TABLET | ORAL | Status: DC

## 2015-07-08 MED ORDER — KETOROLAC TROMETHAMINE 30 MG/ML IJ SOLN
30.0000 mg | Freq: Once | INTRAMUSCULAR | Status: AC
  Administered 2015-07-08: 30 mg via INTRAVENOUS
  Filled 2015-07-08: qty 1

## 2015-07-08 NOTE — ED Notes (Signed)
Pt asking for more toradol, EDP notified.

## 2015-07-08 NOTE — ED Notes (Signed)
Pt denies allergies to Nsaids and Tylenol.

## 2015-07-08 NOTE — ED Notes (Signed)
EDP notified that patient wants to leave.

## 2015-07-08 NOTE — ED Provider Notes (Signed)
CSN: [id number]     Arrival date & time 07/08/15  0711 History   First MD Initiated Contact with Patient 07/08/15 0719     Chief Complaint  Patient presents with  . Headache     (Consider location/radiation/quality/duration/timing/severity/associated sxs/prior Treatment) HPI...... patient complains of bitemporal headache since last night. She was apparently in the emergency department last night and left prior to full evaluation. She's been taking ibuprofen for her pain. No fever, sweats, chills, dyspnea, chest pain, dysuria. She is ambulatory. Patient recently admitted for an overdose. Severity of symptoms is mild to moderate.  Past Medical History  Diagnosis Date  . Bronchitis   . Peptic ulcer   . Bipolar 1 disorder (HCC)   . History of substance abuse 06/16/2015  . Hepatitis C antibody test positive 06/17/2015  . Polysubstance abuse    Past Surgical History  Procedure Laterality Date  . Other surgical history      scar tissue removed from right ovary   . Other surgical history      fallopian tube repair  . Wisdom tooth extraction     Family History  Problem Relation Age of Onset  . Cancer Other   . Drug abuse Brother    Social History  Substance Use Topics  . Smoking status: Current Every Day Smoker -- 1.00 packs/day for 10 years    Types: Cigarettes  . Smokeless tobacco: Never Used  . Alcohol Use: Yes     Comment: occ   OB History    Gravida Para Term Preterm AB TAB SAB Ectopic Multiple Living   Review of Systems  All other systems reviewed and are negative.     Allergies  Nsaids and Tylenol  Home Medications   Prior to Admission medications   Medication Sig Start Date End Date Taking? Authorizing Provider  citalopram (CELEXA) 20 MG tablet Take 20 mg by mouth every morning.   Yes Historical Provider, MD  levothyroxine (SYNTHROID, LEVOTHROID) 50 MCG tablet Take 50 mcg by mouth every morning.   Yes Historical Provider, MD  traZODone  (DESYREL) 150 MG tablet Take 150 mg by mouth at bedtime.   Yes Historical Provider, MD  amoxicillin-clavulanate (AUGMENTIN) 875-125 MG tablet Take 1 tablet by mouth 2 (two) times daily. Patient not taking: Reported on 07/07/2015 07/05/15   Houston Siren, MD  azithromycin (ZITHROMAX Z-PAK) 250 MG tablet As directed 07/08/15   Donnetta Hutching, MD  potassium chloride SA (K-DUR,KLOR-CON) 20 MEQ tablet Take 1 tablet (20 mEq total) by mouth 2 (two) times daily. 07/08/15   Donnetta Hutching, MD  predniSONE (STERAPRED UNI-PAK 21 TAB) 10 MG (21) TBPK tablet Use as directed Patient not taking: Reported on 07/07/2015 07/05/15   Houston Siren, MD   BP 126/78 mmHg  Pulse 68  Temp(Src) 98 F (36.7 C) (Oral)  Resp 18  Ht  (1.575 m)  Wt 170 lb (77.111 kg)  BMI 31.09 kg/m2  SpO2 96%  LMP 06/19/2015 Physical Exam  Constitutional: She is oriented to person, place, and time. She appears well-developed and well-nourished.  Ambulatory without dyspnea.  HENT:  Head: Normocephalic and atraumatic.  Eyes: Conjunctivae and EOM are normal. Pupils are equal, round, and reactive to light.  Neck: Normal range of motion. Neck supple.  Cardiovascular: Normal rate and regular rhythm.   Pulmonary/Chest: Effort normal and breath sounds normal.  Abdominal: Soft. Bowel sounds are normal.  Musculoskeletal: Normal range of motion.  Neurological: She is alert and oriented to person, place, and time.  Skin: Skin is warm and dry.  Psychiatric: She has a normal mood and affect. Her behavior is normal.  Nursing note and vitals reviewed.   ED Course  Procedures (including critical care time) Labs Review Labs Reviewed  CBC WITH DIFFERENTIAL/PLATELET - Abnormal; Notable for the following:    WBC 11.3 (*)    All other components within normal limits  BASIC METABOLIC PANEL - Abnormal; Notable for the following:    Potassium 2.5 (*)    Glucose, Bld 106 (*)    Calcium 8.8 (*)    All other components within normal limits    Imaging Review Dg  Chest 2 View  07/08/2015  CLINICAL DATA:  Right pleural effusion EXAM: CHEST  2 VIEW COMPARISON:  July 07, 2015 FINDINGS: The right pleural effusion noted on the previous study is not appreciably changed. There is mild stable bilateral atelectatic change. There is a questionable 8 mm nodular opacity in the right base, unchanged from 1 day prior. Lungs elsewhere clear. Heart size and pulmonary vascularity are normal. No adenopathy. No bone lesions. IMPRESSION: Stable small right pleural effusion with mild bibasilar atelectasis. 8 mm nodular opacity right base again noted. No new opacity. No change in cardiac silhouette. If the nodular opacity in the right base persists after clearing of effusion, noncontrast enhanced chest CT to further assess would likely be warranted. Electronically Signed   By: Bretta Bang III M.D.   On: 07/08/2015 08:29   Dg Chest 2 View  07/07/2015  CLINICAL DATA:  Overdose, intubation, LEFT hospital AMA on Sunday, pneumonia, cough, shortness of breath, fever EXAM: CHEST  2 VIEW COMPARISON:  07/06/2015 FINDINGS: Enlargement of cardiac silhouette. Mediastinal contours and pulmonary vascularity normal. Small to moderate RIGHT pleural effusion and basilar atelectasis. Remaining lungs clear. Questionable 8 mm diameter nodular density at RIGHT lung base though this was not definitely visual distal any of the previous exams. No pneumothorax. Bones unremarkable. IMPRESSION: Persistent small to moderate RIGHT pleural effusion and basilar atelectasis. Questionable 8 mm nodular density versus atelectasis at RIGHT base ; followup chest radiographs recommended in 2-3 weeks following resolution of patient's cute acute process to ensure resolution and exclude pulmonary nodule. If follow-up cannot be assured, consider CT instead. Electronically Signed   By: Ulyses Southward M.D.   On: 07/07/2015 12:36   I have personally reviewed and evaluated these images and lab results as part of my medical  decision-making.   EKG Interpretation None      MDM   Final diagnoses:  Pleural effusion, right  Hypokalemia    Patient is in no acute distress. Chest x-ray reveals a stable right pleural effusion with mild bilateral atelectasis. Patient understands to return in 2-3 weeks for repeat x-ray of the lungs to assess her pleural effusion and her 8mm nodular density in the right base. Hypokalemia noted. Patient was given oral replacement in the ED and discharged home with a prescription for potassium and Zithromax.    Donnetta Hutching, MD 07/08/15 1539

## 2015-07-08 NOTE — ED Notes (Signed)
Pt d/c papers given and reviewed. Pt. Verbalized understanding and has no further questions. Pt. Ambulatory out of dept. With steady gait to waiting room, states father is waiting on her.

## 2015-07-08 NOTE — ED Notes (Addendum)
CRITICAL VALUE ALERT  Critical value received:  Potassium 2.5  Date of notification:  07/08/15  Time of notification:  0850  Critical value read back:Yes.    Nurse who received alert:  S Laurice Kimmons RN  MD notified (1st page):  Dr. Adriana Simas  Time of first page:  [phone]  MD notified (2nd page):  Time of second page:  Responding MD:  Dr. Adriana Simas  Time MD responded:  [phone]

## 2015-07-08 NOTE — Discharge Instructions (Signed)
You have a small amount of fluid at the base of your right lung called a pleural effusion. Additionally, your potassium is low. Prescription for antibiotic and potassium replacement. You will need a repeat chest x-ray in 10-14 days and a recheck of your potassium at that time. If you cannot get a primary care doctor, recommend returning to the emergency department. You can return anytime if your symptoms appear to be worsening. Resource guide given.   Emergency Department Resource Guide 1) Find a Doctor and Pay Out of Pocket Although you won't have to find out who is covered by your insurance plan, it is a good idea to ask around and get recommendations. You will then need to call the office and see if the doctor you have chosen will accept you as a new patient and what types of options they offer for patients who are self-pay. Some doctors offer discounts or will set up payment plans for their patients who do not have insurance, but you will need to ask so you aren't surprised when you get to your appointment.  2) Contact Your Local Health Department Not all health departments have doctors that can see patients for sick visits, but many do, so it is worth a call to see if yours does. If you don't know where your local health department is, you can check in your phone book. The CDC also has a tool to help you locate your state's health department, and many state websites also have listings of all of their local health departments.  3) Find a Walk-in Clinic If your illness is not likely to be very severe or complicated, you may want to try a walk in clinic. These are popping up all over the country in pharmacies, drugstores, and shopping centers. They're usually staffed by nurse practitioners or physician assistants that have been trained to treat common illnesses and complaints. They're usually fairly quick and inexpensive. However, if you have serious medical issues or chronic medical problems, these are  probably not your best option.  No Primary Care Doctor: - Call Health Connect at  [phone] - they can help you locate a primary care doctor that  accepts your insurance, provides certain services, etc. - Physician Referral Service- [phone]  Chronic Pain Problems: Organization         Address  Phone   Notes  [facility]  [phone] Patients need to be referred by their primary care doctor.   Medication Assistance: Organization         Address  Phone   Notes  [facility] [address] [place] [place], [place] [address] [phone] --Must be a resident of [facility] -- Must have NO insurance coverage whatsoever (no Medicaid/ Medicare, etc.) -- The pt. MUST have a primary care doctor that directs their care regularly and follows them in the community   [facility]ist  [phone]   [employer]  [phone]    Agencies that provide inexpensive medical care: Organization         Address  Phone   Notes  [facility]  [phone]   [facility]    [phone]   [facility] [address] [place], [place] [address] [phone]   [facility] [address] [place] [place] [phone]   Planned Parenthood    [phone]   [facility]    [phone]  Community [facility] and [place]  [address], [place] Phone:  [phone], Fax:  [phone] Hours of Operation:  9 am - 6 pm, M-F.  Also accepts Medicaid/Medicare and self-pay.  [facility] for [place], [address], [place] Phone: [phone], Fax: [phone]. Hours of Operation:  8:30 am - 5:30 pm, M-F.  Also accepts Medicaid and self-pay.  [facility] [place] [address] [place] Phone: [phone]   [place], [place], [place] [phone], Ext. [phone] Mondays & Thursdays:  7-9 AM.  First 15 patients are seen on a first come, first serve basis.    [place] Providers:  Organization         Address  Phone   Notes  [facility] [address],  [phone] Also accepts self-pay patients.  [facility] [address], [place]  [phone]   [place], [address], [place] [phone]   [facility] [address] [place] [phone]   Lucianne Lei [address], [place]   [phone] Only accepts [place] Access [place] patients after they have their name applied to their card.   Self-Pay (no insurance) in [facility]:  Organization         Address  Phone   Notes  Sickle Cell Patients, [facility] Internal Medicine [place] [phone]   [facility] [place] [phone]   [facility] [place]  [place], [place], [place] [phone]   [facility]/Dr. Osei-Bonsu  [address], [place] or [address], [place] [phone] Phone number for both [place] and [place] locations is the same.  Urgent Medical and [facility] [address], [place] [phone]   [facility] [address] [place] or [address] [phone]   [facility] [address] [place] [address], [place] [phone], phone; [phone], fax Sees patients 1st and 3rd Saturday of every month.  Must not qualify for public or private insurance (i.e. Medicaid, Medicare, [place] Health Choice, Veterans' Benefits)  Household income should be no more than 200% of the poverty level The clinic cannot treat you if you are pregnant or think you are pregnant  Sexually transmitted diseases are not treated at the clinic.     Dental Care: Organization         Address  Phone  Notes  [facility] [place] [phone] Accepts children up to age 46 who are enrolled in [place] or [place]; pregnant women with a Medicaid card; and children who have applied for Medicaid or [place] Health Choice, but were declined, whose parents can pay a reduced fee at time of service.  [facility] Department of [facility]  [address], [place] [phone] Accepts children up to age 58 who are enrolled in [place] or [place]; pregnant women with a Medicaid card; and children who have applied for Medicaid or [place] Health Choice, but were declined, whose parents can pay a reduced fee at time of service.  [place]  [phone]  [place] [phone] Patients are seen by appointment only. Walk-ins are not accepted. [place] will see patients 50 years of age and older. Monday - Tuesday (8am-5pm) Most Wednesdays (8:30-5pm) $30 per visit, cash only  [facility] Adult Dental Access PROGRAM  [address], [facility] [phone] Patients are seen by appointment only. Walk-ins are not accepted. [place] will see patients 104 years of age and older. One Wednesday Evening (Monthly: Volunteer Based).  $30 per visit, cash only  [place]  [phone] for adults; Children under age 13, call Graduate Pediatric Dentistry at [phone]. Children aged 62-14, please call [phone] to request a pediatric application.  Dental services are provided in all areas of dental care including fillings, crowns and bridges, complete and partial dentures, implants, gum treatment, root canals, and extractions. Preventive care is also provided. Treatment is provided to both adults and children. Patients are selected via a lottery and there is often a waiting  list.   [facility] [address] [place]  [phone] [email or web address]   [facility] [address] [place], [place] [phone], Ext. [phone] Second and Fourth Thursday of each month, opens at 6:30 AM; Clinic ends at 9 AM.  Patients are seen on a first-come first-served basis, and a limited number are seen during each clinic.   [facility]  [address] Hillard Danker [place], [place] [phone]   Eligibility Requirements You must have lived in [place], [place], or [place] counties for at least the last three months.   You cannot be eligible for state or federal sponsored [employer], including [employer], [place], or [employer].   You generally cannot be eligible for healthcare insurance through your employer.    How to apply: Eligibility screenings are held every Tuesday and Wednesday afternoon from 1:00 pm until 4:00 pm. You do not need an appointment for the interview!  [facility] [address], [place], [place]   [place]  [place] Department  [place]  [phone]    Behavioral Health Resources in the Community: Intensive Outpatient Programs Organization         Address  Phone  Notes  [place]. [address] [place], [place] [phone]   [facility] Outpatient [address] [place], [place]   ADS: Alcohol & Drug S[facility] [address], [place], [place]   [place] [address],  [place], [place] or [phone]   Substance Abuse Resources Organization         Address  Phone  Notes  Alcohol and Drug Services  [phone]   [place]  [phone]   The [place]   Chinita Pester  [phone]   Residential & Outpatient Substance Abuse Program   [phone]   Psychological Services Organization         Address  Phone  Notes  [facility] [place]  [place]  [phone]   [place] [address] [place] or [phone]    Mobile Crisis Teams Organization         Address  Phone  Notes  [facility], Mobile Crisis Care Unit  [phone]   [facility]  [address] [place], [place]  [facility] [address] [place] [phone]    Self-Help/Support Groups Organization         Address  Phone             Notes  [facility] [place] - variety of support groups  [phone]- [phone] Call for more information  Narcotics Anonymous (NA), [facility] [address], [employer] [place]  2 meetings at this location   Statistician         Address  Phone  Notes  [facility] [address],    [place]  [phone]   [facility]  [address] [place] [address], [place], [place] [phone]   [facility] [address] [place] [address] [place], [place] [phone] Admissions: 8am-3pm M-F  [facility] [address]    [place], [place] [phone]   [facility] [address] [place], [place], [place] [phone]   [facility] [address],  [place], [place] [phone]   [facility] - Intensive Outpatient [address], [place], [place] [phone]   [facility] (Addiction Recovery Care [facility]oc[facility] [address] [place]  [place], [place] [phone] or [phone]   [facility] ([facility]) [address], [place], [place] [phone] Accepts Medicaid  [facility] [place] [address]  [place] [phone] Substance Abuse/Addiction Treatment   [facility] Organization          Address  Phone  Notes  [facility]  [phone]   Angie Fava, PhD [address] Ervin Knack [place], [place]   [phone] or [phone]   [facility]   [address] [place], [place] [phone]   [facility] [address] [place], [place] [phone] Insurance/Medicaid/sponsorship through [facility] and Families [address]                                    [place], [place] [phone] Therapy/tele-psych/case  [facility] [address], [place] [phone]    Dr. Lolly Mustache  [phone]   [facility] [place]  [employer] [facility] Dept. 1) [address] [place] 2) [address], [place] 3)  [address], [place] [phone]  [phone]   [facility] Child Abuse Hotline 646-879-6741 or [phone] (After Hours)

## 2015-07-08 NOTE — ED Notes (Signed)
Pt called out to EMS for headache and states she was here yesterday, dx with Pneumonia but left without being treated.

## 2015-07-08 NOTE — ED Notes (Signed)
IV removed at patient request, pt. Wants to leave, and states she is "done with people" but will not elaborate. Asked patient to please stay and get d/c papers/prescriptions.

## 2015-07-14 ENCOUNTER — Emergency Department (HOSPITAL_COMMUNITY)
Admission: EM | Admit: 2015-07-14 | Discharge: 2015-07-14 | Discharge status: Against Medical Advice | Payer: Self-pay | Attending: Emergency Medicine | Admitting: Emergency Medicine

## 2015-07-14 ENCOUNTER — Encounter (HOSPITAL_COMMUNITY): Payer: Self-pay

## 2015-07-14 DIAGNOSIS — T7840XA Allergy, unspecified, initial encounter: Secondary | ICD-10-CM | POA: Insufficient documentation

## 2015-07-14 DIAGNOSIS — R55 Syncope and collapse: Secondary | ICD-10-CM | POA: Insufficient documentation

## 2015-07-14 DIAGNOSIS — Z79899 Other long term (current) drug therapy: Secondary | ICD-10-CM | POA: Insufficient documentation

## 2015-07-14 DIAGNOSIS — Z8709 Personal history of other diseases of the respiratory system: Secondary | ICD-10-CM | POA: Insufficient documentation

## 2015-07-14 DIAGNOSIS — Y998 Other external cause status: Secondary | ICD-10-CM | POA: Insufficient documentation

## 2015-07-14 DIAGNOSIS — X58XXXA Exposure to other specified factors, initial encounter: Secondary | ICD-10-CM | POA: Insufficient documentation

## 2015-07-14 DIAGNOSIS — Y9389 Activity, other specified: Secondary | ICD-10-CM | POA: Insufficient documentation

## 2015-07-14 DIAGNOSIS — F319 Bipolar disorder, unspecified: Secondary | ICD-10-CM | POA: Insufficient documentation

## 2015-07-14 DIAGNOSIS — Z8711 Personal history of peptic ulcer disease: Secondary | ICD-10-CM | POA: Insufficient documentation

## 2015-07-14 DIAGNOSIS — F17210 Nicotine dependence, cigarettes, uncomplicated: Secondary | ICD-10-CM | POA: Insufficient documentation

## 2015-07-14 DIAGNOSIS — Y9289 Other specified places as the place of occurrence of the external cause: Secondary | ICD-10-CM | POA: Insufficient documentation

## 2015-07-14 MED ORDER — FAMOTIDINE 20 MG PO TABS: 20.0000 mg | ORAL_TABLET | Freq: Two times a day (BID) | ORAL | Status: DC

## 2015-07-14 MED ORDER — PREDNISONE 20 MG PO TABS: ORAL_TABLET | ORAL | Status: DC

## 2015-07-14 NOTE — ED Provider Notes (Signed)
CSN: [id number]     Arrival date & time 07/14/15  0241 History   First MD Initiated Contact with Patient 07/14/15 0335   Chief Complaint  Patient presents with  . Allergic Reaction     (Consider location/radiation/quality/duration/timing/severity/associated sxs/prior Treatment) HPI patient states she took her last dose of a Z-Pak tonight about 8:30 PM. She states she's been short of breath for several days. She states a few hours later she started getting more short of breath and states "I was tripping out", which made it worse. She states she broke out in a rash that was itchy and she felt like she was wheezing. She states she's never used an inhaler in the past. EMS had been called and is as a pulled up to her how she states she passed out. EMS gave her Solu-Medrol 125 mg IV Benadryl 25 mg IV and at the 0.3 mg IM. She states her breathing is better now, her rash is gone, her itching is gone. She states she did have one episode of vomiting.  Reviewing her prior records shows patient was admitted on February 18 after a heroin overdose requiring intubation. She did awaken and left AMA. She was called and told she had a pneumonia and she came to the ED on February 21. She left the ER AMA at that time. She came back to the ER on February 22 for headache and at that time was started on the Zithromax.   PCP none  Past Medical History  Diagnosis Date  . Bronchitis   . Peptic ulcer   . Bipolar 1 disorder (HCC)   . History of substance abuse 06/16/2015  . Hepatitis C antibody test positive 06/17/2015  . Polysubstance abuse    Past Surgical History  Procedure Laterality Date  . Other surgical history      scar tissue removed from right ovary   . Other surgical history      fallopian tube repair  . Wisdom tooth extraction     Family History  Problem Relation Age of Onset  . Cancer Other   . Drug abuse Brother    Social History  Substance Use Topics  . Smoking status: Current Every Day  Smoker -- 1.00 packs/day for 10 years    Types: Cigarettes  . Smokeless tobacco: Never Used  . Alcohol Use: Yes     Comment: occ   Polysubstance abuse.  OB History    Gravida Para Term Preterm AB TAB SAB Ectopic Multiple Living   Review of Systems  All other systems reviewed and are negative.     Allergies  Nsaids  Home Medications   Prior to Admission medications   Medication Sig Start Date End Date Taking? Authorizing Provider  amoxicillin-clavulanate (AUGMENTIN) 875-125 MG tablet Take 1 tablet by mouth 2 (two) times daily. Patient not taking: Reported on 07/07/2015 07/05/15   Houston Siren, MD  azithromycin (ZITHROMAX Z-PAK) 250 MG tablet As directed 07/08/15   Donnetta Hutching, MD  citalopram (CELEXA) 20 MG tablet Take 20 mg by mouth every morning.    Historical Provider, MD  famotidine (PEPCID) 20 MG tablet Take 1 tablet (20 mg total) by mouth 2 (two) times daily. 07/14/15   Devoria Albe, MD  levothyroxine (SYNTHROID, LEVOTHROID) 50 MCG tablet Take 50 mcg by mouth every morning.    Historical Provider, MD  potassium chloride SA (K-DUR,KLOR-CON) 20 MEQ tablet Take 1 tablet (20 mEq  total) by mouth 2 (two) times daily. 07/08/15   Donnetta Hutching, MD  predniSONE (DELTASONE) 20 MG tablet Take 3 po QD x 3d , then 2 po QD x 3d then 1 po QD x 3d 07/14/15   Devoria Albe, MD  traZODone (DESYREL) 150 MG tablet Take 150 mg by mouth at bedtime.    Historical Provider, MD   BP 119/70 mmHg  Pulse 88  Temp(Src) 97.9 F (36.6 C) (Oral)  Resp 20  Ht  (1.575 m)  Wt 170 lb (77.111 kg)  BMI 31.09 kg/m2  SpO2 98%  LMP 07/07/2015  Vital signs normal   Physical Exam  Constitutional: She is oriented to person, place, and time. She appears well-developed and well-nourished.  Non-toxic appearance. She does not appear ill. No distress.  HENT:  Head: Normocephalic and atraumatic.  Right Ear: External ear normal.  Left Ear: External ear normal.  Nose: Nose normal. No mucosal edema or  rhinorrhea.  Mouth/Throat: Oropharynx is clear and moist and mucous membranes are normal. No dental abscesses or uvula swelling.  Patient is sniffing and rubbing her nose and her face.  Eyes: Conjunctivae and EOM are normal. Pupils are equal, round, and reactive to light.  Neck: Normal range of motion and full passive range of motion without pain. Neck supple.  Cardiovascular: Normal rate, regular rhythm and normal heart sounds.  Exam reveals no gallop and no friction rub.   No murmur heard. Pulmonary/Chest: Effort normal and breath sounds normal. No respiratory distress. She has no wheezes. She has no rhonchi. She has no rales. She exhibits no tenderness and no crepitus.  Abdominal: Soft. Normal appearance and bowel sounds are normal. She exhibits no distension. There is no tenderness. There is no rebound and no guarding.  Musculoskeletal: Normal range of motion. She exhibits no edema or tenderness.  Moves all extremities well.   Neurological: She is alert and oriented to person, place, and time. She has normal strength. No cranial nerve deficit.  Skin: Skin is warm, dry and intact. No rash noted. No erythema. No pallor.  Psychiatric: She has a normal mood and affect. Her speech is normal and behavior is normal. Her mood appears not anxious.  Nursing note and vitals reviewed.   ED Course  Procedures (including critical care time)  When I entered the room patient was angry and was on the phone. She states "I can't get any anyone to come pick me up". States she's ready to go. Ice asked her to just stay in relaxed a while since she has no way to get home. She states she wants to go now.   Patient refuses given x-ray getting lab work done. Patient states she wants to leave. However she has no way to get home. She states she does not want to stay. She has been in the ED at least an hour and her symptoms have not gotten worse. Patient was discharged AMA.    MDM   Final diagnoses:  Acute  allergic reaction, initial encounter  Syncope, unspecified syncope type    New Prescriptions   FAMOTIDINE (PEPCID) 20 MG TABLET    Take 1 tablet (20 mg total) by mouth 2 (two) times daily.   PREDNISONE (DELTASONE) 20 MG TABLET    Take 3 po QD x 3d , then 2 po QD x 3d then 1 po QD x 3d    Plan pt left AMA  Devoria Albe, MD, Concha Pyo, MD 07/14/15 [phone]

## 2015-07-14 NOTE — Discharge Instructions (Signed)
Avoid getting hot, heat will make the rash return or the itching return. Take the medications as prescribed. You should be rechecked if you feel worse.

## 2015-07-14 NOTE — ED Notes (Signed)
Patient came out of room and stated "hello? Can somebody get me something to drink. The doctor said I could have something. And my IV is out, not that anyone cares." Gave patient coke as requested.

## 2015-07-14 NOTE — ED Notes (Signed)
Patient verbalizes understanding of discharge instructions, prescription medications, home care and follow up care if needed. Patient provided paper scrubs, and is ambulatory out of department at this time.

## 2015-07-14 NOTE — ED Notes (Signed)
Patient via [facility] for allergic reaction. Azithromycin  for Pneumonia, tonight patient developed hives and shortness of breath. Patient given solumedrol , benadryl , Epi 0.3mg  IM upon arrival no complaints of itching, or shortness of breath.

## 2015-07-16 ENCOUNTER — Telehealth: Payer: Self-pay | Admitting: Gastroenterology

## 2015-07-16 ENCOUNTER — Inpatient Hospital Stay: Payer: Self-pay | Admitting: Gastroenterology

## 2015-07-16 NOTE — Telephone Encounter (Signed)
Pt was a no show

## 2015-11-21 ENCOUNTER — Emergency Department (HOSPITAL_COMMUNITY)
Admission: EM | Admit: 2015-11-21 | Discharge: 2015-11-21 | Disposition: A | Discharge status: Home / Self Care | Payer: Self-pay | Attending: Emergency Medicine | Admitting: Emergency Medicine

## 2015-11-21 ENCOUNTER — Encounter (HOSPITAL_COMMUNITY): Payer: Self-pay | Admitting: Emergency Medicine

## 2015-11-21 DIAGNOSIS — L089 Local infection of the skin and subcutaneous tissue, unspecified: Secondary | ICD-10-CM

## 2015-11-21 DIAGNOSIS — F17210 Nicotine dependence, cigarettes, uncomplicated: Secondary | ICD-10-CM | POA: Insufficient documentation

## 2015-11-21 DIAGNOSIS — Z79899 Other long term (current) drug therapy: Secondary | ICD-10-CM | POA: Insufficient documentation

## 2015-11-21 DIAGNOSIS — Z792 Long term (current) use of antibiotics: Secondary | ICD-10-CM | POA: Insufficient documentation

## 2015-11-21 DIAGNOSIS — F319 Bipolar disorder, unspecified: Secondary | ICD-10-CM | POA: Insufficient documentation

## 2015-11-21 DIAGNOSIS — L600 Ingrowing nail: Secondary | ICD-10-CM | POA: Insufficient documentation

## 2015-11-21 MED ORDER — LIDOCAINE HCL (PF) 2 % IJ SOLN
2.0000 mL | Freq: Once | INTRAMUSCULAR | Status: AC
  Administered 2015-11-21: 2 mL
  Filled 2015-11-21: qty 10

## 2015-11-21 MED ORDER — CEPHALEXIN 250 MG/5ML PO SUSR: 500.0000 mg | Freq: Once | ORAL | Status: DC

## 2015-11-21 MED ORDER — CEPHALEXIN 500 MG PO CAPS: 500.0000 mg | ORAL_CAPSULE | Freq: Four times a day (QID) | ORAL | Status: DC

## 2015-11-21 MED ORDER — POVIDONE-IODINE 10 % EX SOLN
CUTANEOUS | Status: AC
  Filled 2015-11-21: qty 118

## 2015-11-21 NOTE — ED Notes (Signed)
Patient cursing stating "I have been here 6 fucking hours for a fucking toe. This place is slow as malasis". Refused po medication prior to d/c. Refused to sign d/c papers. Verbal instructions given to patient regarding care and script given. Verbalizes understanding. Steady gait out of dept.

## 2015-11-21 NOTE — ED Notes (Signed)
Patient states she injured her tow 2 weeks ago. She states that now the great toe of her right foot is throbbing and toe nail is about to come off. States drainage from toe.

## 2015-11-21 NOTE — Discharge Instructions (Signed)
Fingernail or Toenail Removal Fingernail or toenail removal is a surgical procedure to take off a nail from your finger or your toe. You may need to have a fingernail or toenail removed if it has an abnormal shape (deformity) or if it is severely injured. A fingernail or toenail may also be removed due to a bacterial infection, a severe ingrown toenail, or a fungal infection that has failed treatment with antifungal medicines. [facility] PROVID[facility] KNOW ABOUT:  Any allergies you have.  All medicines you are taking, including vitamins, herbs, eye drops, creams, and over-the-counter medicines.  Previous problems you or members of your family have had with the use of anesthetics.  Any blood disorders you have.  Previous surgeries you have had.  Any medical conditions you may have. RISKS AND COMPLICATIONS Generally, this is a safe procedure. However, problems may occur, including:  Pain.  Bleeding.  Infection.  Regrowth of a deformed nail. BEFORE THE PROCEDURE  Ask your health care provider about changing or stopping your regular medicines. This is especially important if you are taking diabetes medicines or blood thinners.  Follow instructions from your health care provider about eating or drinking restrictions.  Plan to have someone take you home after the procedure. PROCEDURE  An IV tube will be inserted into one of your veins.  You will be given one or more of the following:  A medicine that helps you relax (sedative).  A medicine that numbs the area (local anesthetic).  After your toe or finger is numb, your health care provider will insert a blunt instrument under your nail to lift it up.  In some cases, your health care provider may also make a cut (incision) in your nail.  After your nail is lifted away from your toe or finger, your health care provider will detach it from your nail bed.  A germ-killing bandage (antiseptic dressing) will be put on your toe  or finger. The procedure may vary among health care providers and hospitals. AFTER THE PROCEDURE  Your blood pressure, heart rate, breathing rate, and blood oxygen level will be monitored often until the medicines you were given have worn off.  It is common to have some pain after nail removal. You will be given pain medicine as needed.  You may be given a prescription for pain medicine and antibiotic medicine.  If you had a toenail removed, you will be given a surgical shoe to wear while you recover.  If you had a fingernail removed, you may be given a finger splint to wear while you recover.   This information is not intended to replace advice given to you by your health care provider. Make sure you discuss any questions you have with your health care provider.   Document Released: 01/29/2003 Document Revised: 09/16/2014 Document Reviewed: 04/30/2014 Elsevier Interactive Patient Education 2016 [employer]  Ingrown Toenail An ingrown toenail occurs when the corner or sides of your toenail grow into the surrounding skin. The big toe is most commonly affected, but it can happen to any of your toes. If your ingrown toenail is not treated, you will be at risk for infection. CAUSES This condition may be caused by:  Wearing shoes that are too small or tight.  Injury or trauma, such as stubbing your toe or having your toe stepped on.  Improper cutting or care of your toenails.  Being born with (congenital) nail or foot abnormalities, such as having a nail that is too big for  your toe. RISK FACTORS Risk factors for an ingrown toenail include:  Age. Your nails tend to thicken as you get older, so ingrown nails are more common in older people.  Diabetes.  Cutting your toenails incorrectly.  Blood circulation problems. SYMPTOMS Symptoms may include:  Pain, soreness, or tenderness.  Redness.  Swelling.  Hardening of the skin surrounding the toe. Your ingrown toenail may be  infected if there is fluid, pus, or drainage. DIAGNOSIS  An ingrown toenail may be diagnosed by medical history and physical exam. If your toenail is infected, your health care provider may test a sample of the drainage. TREATMENT Treatment depends on the severity of your ingrown toenail. Some ingrown toenails may be treated at home. More severe or infected ingrown toenails may require surgery to remove all or part of the nail. Infected ingrown toenails may also be treated with antibiotic medicines. HOME CARE INSTRUCTIONS  If you were prescribed an antibiotic medicine, finish all of it even if you start to feel better.  Soak your foot in warm soapy water for 20 minutes, 3 times per day or as directed by your health care provider.  Carefully lift the edge of the nail away from the sore skin by wedging a small piece of cotton under the corner of the nail. This may help with the pain. Be careful not to cause more injury to the area.  Wear shoes that fit well. If your ingrown toenail is causing you pain, try wearing sandals, if possible.  Trim your toenails regularly and carefully. Do not cut them in a curved shape. Cut your toenails straight across. This prevents injury to the skin at the corners of the toenail.  Keep your feet clean and dry.  If you are having trouble walking and are given crutches by your health care provider, use them as directed.  Do not pick at your toenail or try to remove it yourself.  Take medicines only as directed by your health care provider.  Keep all follow-up visits as directed by your health care provider. This is important. SEEK MEDICAL CARE IF:  Your symptoms do not improve with treatment. SEEK IMMEDIATE MEDICAL CARE IF:  You have red streaks that start at your foot and go up your leg.  You have a fever.  You have increased redness, swelling, or pain.  You have fluid, blood, or pus coming from your toenail.   This information is not intended to  replace advice given to you by your health care provider. Make sure you discuss any questions you have with your health care provider.   Document Released: 04/29/2000 Document Revised: 09/16/2014 Document Reviewed: 03/26/2014 Elsevier Interactive Patient Education [employer]

## 2015-11-21 NOTE — ED Provider Notes (Signed)
CSN: [id number]     Arrival date & time 11/21/15  1528 History   First MD Initiated Contact with Patient 11/21/15 1651     Chief Complaint  Patient presents with  . Toe Pain     (Consider location/radiation/quality/duration/timing/severity/associated sxs/prior Treatment) The history is provided by the patient.   Becky [name] is a 25 y.o. female presenting for evaluation of an infection, ingrown and damaged great toe nail on her right foot.  She hit the nail going up a flight of steps about 2 weeks ago, causing the nail to loosen and Since the nail has curled around her distal nailbed causing with a pain.  Over the past several days she is developed increased swelling, redness and now a small amount of purulence is draining from where the nail is embedded.  She denies fevers or chills, has had no radiation of pain which is constant, throbbing and has found no alleviating.  Past medical history is significant for peptic ulcer disease, bipolar disorder, polysubstance abuse with recent admission for overdose.  She is requesting removal of this toenail.     Past Medical History  Diagnosis Date  . Bronchitis   . Peptic ulcer   . Bipolar 1 disorder (HCC)   . History of substance abuse 06/16/2015  . Hepatitis C antibody test positive 06/17/2015  . Polysubstance abuse    Past Surgical History  Procedure Laterality Date  . Other surgical history      scar tissue removed from right ovary   . Other surgical history      fallopian tube repair  . Wisdom tooth extraction     Family History  Problem Relation Age of Onset  . Cancer Other   . Drug abuse Brother    Social History  Substance Use Topics  . Smoking status: Current Every Day Smoker -- 1.00 packs/day for 10 years    Types: Cigarettes  . Smokeless tobacco: Never Used  . Alcohol Use: Yes     Comment: occ   OB History    Gravida Para Term Preterm AB TAB SAB Ectopic Multiple Living   Review of Systems    Constitutional: Negative for fever and chills.  Musculoskeletal: Negative for myalgias, joint swelling and arthralgias.  Skin: Positive for color change and wound.  Neurological: Negative for weakness and numbness.      Allergies  Nsaids  Home Medications   Prior to Admission medications   Medication Sig Start Date End Date Taking? Authorizing Provider  amoxicillin-clavulanate (AUGMENTIN) 875-125 MG tablet Take 1 tablet by mouth 2 (two) times daily. Patient not taking: Reported on 07/07/2015 07/05/15   Houston Siren, MD  azithromycin (ZITHROMAX Z-PAK) 250 MG tablet As directed 07/08/15   Donnetta Hutching, MD  cephALEXin (KEFLEX) 500 MG capsule Take 1 capsule (500 mg total) by mouth 4 (four) times daily. 11/21/15   Burgess Amor, PA-C  citalopram (CELEXA) 20 MG tablet Take 20 mg by mouth every morning.    Historical Provider, MD  famotidine (PEPCID) 20 MG tablet Take 1 tablet (20 mg total) by mouth 2 (two) times daily. 07/14/15   Devoria Albe, MD  levothyroxine (SYNTHROID, LEVOTHROID) 50 MCG tablet Take 50 mcg by mouth every morning.    Historical Provider, MD  potassium chloride SA (K-DUR,KLOR-CON) 20 MEQ tablet Take 1 tablet (20 mEq total) by mouth 2 (two) times daily. 07/08/15   Donnetta Hutching, MD  predniSONE (DELTASONE) 20  MG tablet Take 3 po QD x 3d , then 2 po QD x 3d then 1 po QD x 3d 07/14/15   Devoria AlbeIva Knapp, MD  traZODone (DESYREL) 150 MG tablet Take 150 mg by mouth at bedtime.    Historical Provider, MD   BP 133/77 mmHg  Pulse 85  Temp(Src) 98.4 F (36.9 C) (Oral)  Resp 16  Ht 5\' 2"  (1.575 m)  Wt 72.576 kg  BMI 29.26 kg/m2  SpO2 96%  LMP 11/09/2015 Physical Exam  Constitutional: She appears well-developed and well-nourished.  HENT:  Head: Atraumatic.  Neck: Normal range of motion.  Cardiovascular:  Pulses equal bilaterally  Musculoskeletal: She exhibits edema and tenderness.       Feet:  Moderate erythema and tenderness of the right great distal toe with drainage from the lateral cuticle  edge.  The nail plate is very thickened and curled with the lateral edges nearly pointing towards each other with a portion of her nail bed trapped within the nail.  There is moderate edema.  There is no red streaking, distal sensation is intact, dorsalis pedal pulses present.  There is no pus collection.  Tuft is soft.  Neurological: She is alert. She has normal strength. She displays normal reflexes. No sensory deficit.  Skin: Skin is warm and dry.  Psychiatric: She has a normal mood and affect.    ED Course  .Nail Removal Date/Time: 11/21/2015 6:45 PM Performed by: Burgess AmorIDOL, Merdis Snodgrass Authorized by: Burgess AmorIDOL, Maurilio Puryear Consent: Verbal consent obtained. Risks and benefits: risks, benefits and alternatives were discussed Consent given by: patient Patient identity confirmed: verbally with patient Time out: Immediately prior to procedure a "time out" was called to verify the correct patient, procedure, equipment, support staff and site/side marked as required. Location: right foot Location details: right big toe Anesthesia: digital block Local anesthetic: lidocaine 2% without epinephrine Anesthetic total: 4 ml Preparation: skin prepped with Betadine Amount removed: complete Nail bed sutured: no Nail matrix removed: none Removed nail replaced and anchored: no Dressing: dressing applied Patient tolerance: Patient tolerated the procedure well with no immediate complications Comments: There is no bleeding at the nail bed after the plate was removed.  The bed appears healed, suspect the nail was completely avulsed except at the proximal nail fold at the time of the original injury 2 weeks ago.   (including critical care time) Labs Review Labs Reviewed - No data to display  Imaging Review No results found. I have personally reviewed and evaluated these images and lab results as part of my medical decision-making.   EKG Interpretation None      MDM   Final diagnoses:  Toe infection  Ingrown  right big toenail    Patient was prescribed Keflex.  Encouraged warm Epsom salt soaks.  Elevation.  Follow-up with PCP for recheck if symptoms are not improving with this treatment.  Patient was very unhappy that I was not prescribing her narcotic pain medicine.  I explained to patient that it would not be in her best interest given her past history of problems with narcotics.  Suspect her pain will be greatly relieved now that this nail plate is no longer pinching her infected toe.    Burgess AmorJulie Vitali Seibert, PA-C 11/21/15 2043  Marily MemosJason Mesner, MD 11/22/15 [id number]

## 2016-05-19 ENCOUNTER — Emergency Department (HOSPITAL_COMMUNITY): Admission: EM | Admit: 2016-05-19 | Discharge: 2016-05-20 | Payer: Self-pay | Attending: *Deleted | Admitting: *Deleted

## 2016-05-19 ENCOUNTER — Encounter (HOSPITAL_COMMUNITY): Payer: Self-pay | Admitting: Emergency Medicine

## 2016-05-19 DIAGNOSIS — F09 Unspecified mental disorder due to known physiological condition: Secondary | ICD-10-CM

## 2016-05-19 DIAGNOSIS — F4310 Post-traumatic stress disorder, unspecified: Secondary | ICD-10-CM | POA: Insufficient documentation

## 2016-05-19 DIAGNOSIS — F17210 Nicotine dependence, cigarettes, uncomplicated: Secondary | ICD-10-CM | POA: Insufficient documentation

## 2016-05-19 DIAGNOSIS — F19988 Other psychoactive substance use, unspecified with other psychoactive substance-induced disorder: Secondary | ICD-10-CM

## 2016-05-19 DIAGNOSIS — F1910 Other psychoactive substance abuse, uncomplicated: Secondary | ICD-10-CM

## 2016-05-19 DIAGNOSIS — Z79899 Other long term (current) drug therapy: Secondary | ICD-10-CM | POA: Insufficient documentation

## 2016-05-19 DIAGNOSIS — F19151 Other psychoactive substance abuse with psychoactive substance-induced psychotic disorder with hallucinations: Secondary | ICD-10-CM | POA: Insufficient documentation

## 2016-05-19 HISTORY — DX: Generalized anxiety disorder: F41.1

## 2016-05-19 HISTORY — DX: Post-traumatic stress disorder, unspecified: F43.10

## 2016-05-19 HISTORY — DX: Obsessive-compulsive disorder, unspecified: F42.9

## 2016-05-19 LAB — CBC WITH DIFFERENTIAL/PLATELET
BASOS ABS: 0 10*3/uL (ref 0.0–0.1)
BASOS PCT: 1 %
EOS ABS: 0 10*3/uL (ref 0.0–0.7)
EOS PCT: 1 %
HCT: 41.5 % (ref 36.0–46.0)
Hemoglobin: 14.2 g/dL (ref 12.0–15.0)
Lymphocytes Relative: 30 %
Lymphs Abs: 2.2 10*3/uL (ref 0.7–4.0)
MCH: 30.4 pg (ref 26.0–34.0)
MCHC: 34.2 g/dL (ref 30.0–36.0)
MCV: 88.9 fL (ref 78.0–100.0)
MONO ABS: 0.4 10*3/uL (ref 0.1–1.0)
MONOS PCT: 5 %
Neutro Abs: 4.6 10*3/uL (ref 1.7–7.7)
Neutrophils Relative %: 63 %
PLATELETS: 314 10*3/uL (ref 150–400)
RBC: 4.67 MIL/uL (ref 3.87–5.11)
RDW: 14.3 % (ref 11.5–15.5)
WBC: 7.2 10*3/uL (ref 4.0–10.5)

## 2016-05-19 LAB — COMPREHENSIVE METABOLIC PANEL
ALBUMIN: 4.4 g/dL (ref 3.5–5.0)
ALK PHOS: 83 U/L (ref 38–126)
ALT: 92 U/L — ABNORMAL HIGH (ref 14–54)
AST: 56 U/L — AB (ref 15–41)
Anion gap: 8 (ref 5–15)
BILIRUBIN TOTAL: 0.5 mg/dL (ref 0.3–1.2)
CALCIUM: 9.7 mg/dL (ref 8.9–10.3)
CO2: 26 mmol/L (ref 22–32)
Chloride: 104 mmol/L (ref 101–111)
Creatinine, Ser: 0.81 mg/dL (ref 0.44–1.00)
GFR calc Af Amer: >60 mL/min (ref >60)
GLUCOSE: 104 mg/dL — AB (ref 65–99)
POTASSIUM: 3.7 mmol/L (ref 3.5–5.1)
Sodium: 138 mmol/L (ref 135–145)
TOTAL PROTEIN: 7.9 g/dL (ref 6.5–8.1)

## 2016-05-19 LAB — WET PREP, GENITAL
CLUE CELLS WET PREP: NONE SEEN
SPERM: NONE SEEN
Trich, Wet Prep: NONE SEEN
WBC WET PREP: NONE SEEN
Yeast Wet Prep HPF POC: NONE SEEN

## 2016-05-19 LAB — RAPID URINE DRUG SCREEN, HOSP PERFORMED
Amphetamines: NOT DETECTED
BARBITURATES: NOT DETECTED
BENZODIAZEPINES: POSITIVE — AB
COCAINE: POSITIVE — AB
OPIATES: NOT DETECTED
Tetrahydrocannabinol: POSITIVE — AB

## 2016-05-19 LAB — URINALYSIS, ROUTINE W REFLEX MICROSCOPIC
Bilirubin Urine: NEGATIVE
GLUCOSE, UA: NEGATIVE mg/dL
Hgb urine dipstick: NEGATIVE
Ketones, ur: NEGATIVE mg/dL
Leukocytes, UA: NEGATIVE
Nitrite: NEGATIVE
PROTEIN: NEGATIVE mg/dL
SPECIFIC GRAVITY, URINE: 1.01 (ref 1.005–1.030)
pH: 7.5 (ref 5.0–8.0)

## 2016-05-19 LAB — ETHANOL

## 2016-05-19 LAB — PREGNANCY, URINE: Preg Test, Ur: NEGATIVE

## 2016-05-19 MED ORDER — ZIPRASIDONE MESYLATE 20 MG IM SOLR
10.0000 mg | Freq: Once | INTRAMUSCULAR | Status: AC
  Administered 2016-05-19: 10 mg via INTRAMUSCULAR
  Filled 2016-05-19: qty 20

## 2016-05-19 MED ORDER — ACETAMINOPHEN 325 MG PO TABS: 650.0000 mg | ORAL_TABLET | ORAL | Status: DC | PRN

## 2016-05-19 MED ORDER — DICYCLOMINE HCL 10 MG PO CAPS
ORAL_CAPSULE | ORAL | Status: AC
  Filled 2016-05-19: qty 1

## 2016-05-19 MED ORDER — DOXYCYCLINE HYCLATE 100 MG PO TABS
100.0000 mg | ORAL_TABLET | Freq: Once | ORAL | Status: AC
  Administered 2016-05-19: 100 mg via ORAL
  Filled 2016-05-19: qty 1

## 2016-05-19 MED ORDER — METHOCARBAMOL 500 MG PO TABS
ORAL_TABLET | ORAL | Status: AC
  Filled 2016-05-19: qty 1

## 2016-05-19 MED ORDER — CITALOPRAM HYDROBROMIDE 20 MG PO TABS
40.0000 mg | ORAL_TABLET | Freq: Every day | ORAL | Status: DC
  Administered 2016-05-19 – 2016-05-20 (×2): 40 mg via ORAL
  Filled 2016-05-19 (×5): qty 2

## 2016-05-19 MED ORDER — SODIUM CHLORIDE 0.9 % IV BOLUS (SEPSIS): 1000.0000 mL | Freq: Once | INTRAVENOUS | Status: DC

## 2016-05-19 MED ORDER — STERILE WATER FOR INJECTION IJ SOLN
INTRAMUSCULAR | Status: AC
  Filled 2016-05-19: qty 10

## 2016-05-19 MED ORDER — DICYCLOMINE HCL 10 MG PO CAPS
10.0000 mg | ORAL_CAPSULE | Freq: Three times a day (TID) | ORAL | Status: DC | PRN
  Administered 2016-05-19 – 2016-05-20 (×2): 10 mg via ORAL
  Filled 2016-05-19 (×2): qty 1

## 2016-05-19 MED ORDER — DOXYCYCLINE HYCLATE 100 MG PO TABS: 100.0000 mg | ORAL_TABLET | Freq: Two times a day (BID) | ORAL | Status: DC

## 2016-05-19 MED ORDER — LORAZEPAM 2 MG/ML IJ SOLN: 0.5000 mg | Freq: Once | INTRAMUSCULAR | Status: DC

## 2016-05-19 MED ORDER — ALUM & MAG HYDROXIDE-SIMETH 200-200-20 MG/5ML PO SUSP: 30.0000 mL | ORAL | Status: DC | PRN

## 2016-05-19 MED ORDER — LEVOTHYROXINE SODIUM 50 MCG PO TABS
50.0000 ug | ORAL_TABLET | Freq: Every morning | ORAL | Status: DC
  Administered 2016-05-19 – 2016-05-20 (×2): 50 ug via ORAL
  Filled 2016-05-19 (×2): qty 1

## 2016-05-19 MED ORDER — LORAZEPAM 2 MG/ML IJ SOLN
1.0000 mg | Freq: Once | INTRAMUSCULAR | Status: AC
Start: 2016-05-19 — End: 2016-05-19
  Administered 2016-05-19: 1 mg via INTRAMUSCULAR
  Filled 2016-05-19: qty 1

## 2016-05-19 MED ORDER — TRAZODONE HCL 50 MG PO TABS
150.0000 mg | ORAL_TABLET | Freq: Every day | ORAL | Status: DC
  Administered 2016-05-19: 150 mg via ORAL
  Filled 2016-05-19: qty 3

## 2016-05-19 MED ORDER — LORAZEPAM 2 MG/ML IJ SOLN
INTRAMUSCULAR | Status: AC
  Filled 2016-05-19: qty 1

## 2016-05-19 MED ORDER — ONDANSETRON HCL 4 MG PO TABS: 4.0000 mg | ORAL_TABLET | Freq: Three times a day (TID) | ORAL | Status: DC | PRN

## 2016-05-19 MED ORDER — METHOCARBAMOL 500 MG PO TABS
500.0000 mg | ORAL_TABLET | Freq: Four times a day (QID) | ORAL | Status: DC | PRN
  Administered 2016-05-19 – 2016-05-20 (×2): 500 mg via ORAL
  Filled 2016-05-19 (×2): qty 1

## 2016-05-19 MED ORDER — LORAZEPAM 2 MG/ML IJ SOLN
INTRAMUSCULAR | Status: AC
Start: 2016-05-19 — End: 2016-05-19
  Administered 2016-05-19: 1 mg via INTRAVENOUS
  Filled 2016-05-19: qty 1

## 2016-05-19 MED ORDER — LORAZEPAM 2 MG/ML IJ SOLN
1.0000 mg | Freq: Once | INTRAMUSCULAR | Status: AC
  Administered 2016-05-19 (×2): 1 mg via INTRAVENOUS
  Filled 2016-05-19: qty 1

## 2016-05-19 MED ORDER — NICOTINE 21 MG/24HR TD PT24
21.0000 mg | MEDICATED_PATCH | Freq: Every day | TRANSDERMAL | Status: DC
  Administered 2016-05-19 – 2016-05-20 (×2): 21 mg via TRANSDERMAL
  Filled 2016-05-19 (×2): qty 1

## 2016-05-19 MED ORDER — METHOCARBAMOL 500 MG PO TABS
500.0000 mg | ORAL_TABLET | Freq: Once | ORAL | Status: AC
  Administered 2016-05-19: 500 mg via ORAL

## 2016-05-19 MED ORDER — DICYCLOMINE HCL 10 MG PO CAPS
10.0000 mg | ORAL_CAPSULE | Freq: Once | ORAL | Status: AC
  Administered 2016-05-19: 10 mg via ORAL

## 2016-05-19 MED ORDER — CEFTRIAXONE SODIUM 250 MG IJ SOLR
250.0000 mg | Freq: Once | INTRAMUSCULAR | Status: AC
  Administered 2016-05-19: 250 mg via INTRAMUSCULAR
  Filled 2016-05-19: qty 250

## 2016-05-19 MED ORDER — LORAZEPAM 1 MG PO TABS
1.0000 mg | ORAL_TABLET | Freq: Three times a day (TID) | ORAL | Status: DC | PRN
  Administered 2016-05-20 (×2): 1 mg via ORAL
  Filled 2016-05-19 (×2): qty 1

## 2016-05-19 NOTE — ED Triage Notes (Signed)
Per EMS: Pt has been awake for 24 hours, has taken 7 ativan, 1 seroquel, cocaine yesterday afternoon.  Pt has been hallucinating all night speaking to people who aren't there and is panicking.  Pt denies si/hi. Pt alert and oriented, cooperative.
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
## 2016-05-19 NOTE — ED Notes (Signed)
Pt is refusing IV at this time.    
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
## 2016-05-19 NOTE — ED Provider Notes (Signed)
[facility]-EMERGENCY DEPT Provider Note   CSN: [id number] Arrival date & time: 05/19/16  1000   By signing my name below, I, Bobbie Stack, attest that this documentation has been prepared under the direction and in the presence of [name], MD. Electronically Signed: Bobbie Stack, Scribe. 05/19/16. 10:14 AM. History   Chief Complaint Chief Complaint  Patient presents with  . Hallucinations    The history is provided by the patient. No language interpreter was used.    HPI Comments: Becky [name] is a 26 y.o. female brought in by ambulance, who presents to the Emergency Department complaining of hallucinations and paranoia that began earlier today. Per triage note: patient has been awake for 24 hours. She has been hallucinating and speaking to people that are not there. Patient reports using cocaine yesterday afternoon.  Pt is intermittently screaming that her mom is stuck under the ambulance and is dying. Past Medical History:  Diagnosis Date  . Bipolar 1 disorder (HCC)   . Bronchitis   . GAD (generalized anxiety disorder)   . Hepatitis C antibody test positive 06/17/2015  . History of substance abuse 06/16/2015  . OCD (obsessive compulsive disorder)   . Peptic ulcer   . Polysubstance abuse   . PTSD (post-traumatic stress disorder)     Patient Active Problem List   Diagnosis Date Noted  . Bipolar I disorder, most recent episode depressed (HCC) 07/04/2015  . Overdose 07/02/2015  . Lactic acidosis 07/02/2015  . Acute respiratory failure (HCC) 07/02/2015  . Sepsis (HCC) 07/02/2015  . AKI (acute kidney injury) (HCC)   . Altered mental status   . Pyrexia   . Abdominal pain, epigastric 06/17/2015  . Hepatitis C antibody test positive 06/17/2015  . Peptic ulcer disease 06/17/2015  . Epigastric pain   . Transaminitis 06/16/2015  . History of substance abuse 06/16/2015  . Bipolar affective disorder, current episode manic with psychotic symptoms (HCC) 03/03/2014     Past Surgical History:  Procedure Laterality Date  . OTHER SURGICAL HISTORY     scar tissue removed from right ovary   . OTHER SURGICAL HISTORY     fallopian tube repair  . WISDOM TOOTH EXTRACTION      OB History    Gravida Para Term Preterm AB Living   2       1 1    SAB TAB Ectopic Multiple Live Births                   Home Medications    Prior to Admission medications   Medication Sig Start Date End Date Taking? Authorizing Provider  traZODone (DESYREL) 150 MG tablet Take 150 mg by mouth at bedtime.   Yes Historical Provider, MD  citalopram (CELEXA) 40 MG tablet Take 40 mg by mouth daily.    Historical Provider, MD  levothyroxine (SYNTHROID, LEVOTHROID) 50 MCG tablet Take 50 mcg by mouth every morning.    Historical Provider, MD  predniSONE (DELTASONE) 20 MG tablet Take 3 po QD x 3d , then 2 po QD x 3d then 1 po QD x 3d 07/14/15   Devoria Albe, MD    Family History Family History  Problem Relation Age of Onset  . Drug abuse Brother   . Cancer Other     Social History Social History  Substance Use Topics  . Smoking status: Current Every Day Smoker    Packs/day: 1.00    Years: 10.00    Types: Cigarettes  . Smokeless tobacco: Never Used  .  Alcohol use Yes     Comment: occ     Allergies   Azithromycin and Nsaids   Review of Systems Review of Systems  Constitutional: Negative for fever.  Respiratory: Negative for shortness of breath.   Gastrointestinal: Negative for abdominal pain.  Psychiatric/Behavioral: Positive for hallucinations.  All other systems reviewed and are negative.    Physical Exam Updated Vital Signs BP 115/74 (BP Location: Right Arm)   Pulse 117   Temp 98.1 F (36.7 C) (Oral)   Resp 20   Ht 5\' 2"  (1.575 m)   Wt 135 lb (61.2 kg)   LMP 05/15/2016 (Exact Date)   SpO2 100%   BMI 24.69 kg/m   Physical Exam  Constitutional: She is oriented to person, place, and time. She appears well-developed. She appears distressed.  HENT:   Head: Normocephalic and atraumatic.  Right Ear: External ear normal.  Left Ear: External ear normal.  Nose: Nose normal.  Mouth/Throat: Oropharynx is clear and moist.  Eyes: Conjunctivae and EOM are normal. Pupils are equal, round, and reactive to light.  Pupils are dilated bilaterally  Neck: Normal range of motion. Neck supple.  Cardiovascular: Regular rhythm.  Tachycardia present.   Pulmonary/Chest: Breath sounds normal. She has no wheezes. She has no rales.  Abdominal: Soft. Bowel sounds are normal. There is no tenderness.  Genitourinary: Vagina normal and uterus normal.  Musculoskeletal: Normal range of motion. She exhibits no tenderness.  Neurological: She is alert and oriented to person, place, and time.  Skin: Skin is warm and dry.  Psychiatric: Her mood appears anxious. Her speech is rapid and/or pressured. She is agitated and actively hallucinating. Thought content is paranoid and delusional. Cognition and memory are impaired. She expresses inappropriate judgment. She expresses no homicidal and no suicidal ideation.  Nursing note and vitals reviewed.    ED Treatments / Results  DIAGNOSTIC STUDIES: Oxygen Saturation is 100% on RA, normal by my interpretation.    COORDINATION OF CARE: 10:14 AM Discussed treatment plan with pt at bedside and pt agreed to plan.  Labs (all labs ordered are listed, but only abnormal results are displayed) Labs Reviewed  COMPREHENSIVE METABOLIC PANEL - Abnormal; Notable for the following:       Result Value   Glucose, Bld 104 (*)    BUN <5 (*)    AST 56 (*)    ALT 92 (*)    All other components within normal limits  RAPID URINE DRUG SCREEN, HOSP PERFORMED - Abnormal; Notable for the following:    Cocaine POSITIVE (*)    Benzodiazepines POSITIVE (*)    Tetrahydrocannabinol POSITIVE (*)    All other components within normal limits  WET PREP, GENITAL  ETHANOL  CBC WITH DIFFERENTIAL/PLATELET  URINALYSIS, ROUTINE W REFLEX MICROSCOPIC   PREGNANCY, URINE  POC URINE PREG, ED  GC/CHLAMYDIA PROBE AMP ([place]) NOT AT [facility]    EKG  EKG Interpretation  Date/Time:  Thursday May 19 2016 10:12:10 EST Ventricular Rate:  138 PR Interval:    QRS Duration: 92 QT Interval:  367 QTC Calculation: 557 R Axis:   92 Text Interpretation:  Sinus tachycardia Consider right atrial enlargement Borderline right axis deviation Repol abnrm, prob ischemia, inferolateral lds Prolonged QT interval Confirmed by Geremy Rister MD, Primitivo Merkey ([id number]) on 05/19/2016 10:22:05 AM       Radiology No results found.  Procedures Procedures (including critical care time)  Medications Ordered in ED Medications  LORazepam (ATIVAN) injection 1 mg (1 mg Intramuscular Not Given 05/19/16 1441)  citalopram (CELEXA) tablet 40 mg (40 mg Oral Given 05/19/16 1431)  levothyroxine (SYNTHROID, LEVOTHROID) tablet 50 mcg (50 mcg Oral Given 05/19/16 1414)  LORazepam (ATIVAN) tablet 1 mg (not administered)  acetaminophen (TYLENOL) tablet 650 mg (not administered)  nicotine (NICODERM CQ - dosed in mg/24 hours) patch 21 mg (not administered)  ondansetron (ZOFRAN) tablet 4 mg (not administered)  alum & mag hydroxide-simeth (MAALOX/MYLANTA) 200-200-20 MG/5ML suspension 30 mL (not administered)  cefTRIAXone (ROCEPHIN) injection 250 mg (not administered)  doxycycline (VIBRA-TABS) tablet 100 mg (not administered)  traZODone (DESYREL) tablet 150 mg (not administered)  dicyclomine (BENTYL) capsule 10 mg (not administered)  methocarbamol (ROBAXIN) tablet 500 mg (not administered)  LORazepam (ATIVAN) injection 1 mg (1 mg Intravenous Given 05/19/16 1100)  ziprasidone (GEODON) injection 10 mg (10 mg Intramuscular Given 05/19/16 1210)  dicyclomine (BENTYL) capsule 10 mg (10 mg Oral Given 05/19/16 1430)  methocarbamol (ROBAXIN) tablet 500 mg (500 mg Oral Given 05/19/16 1430)     Initial Impression / Assessment and Plan / ED Course  I have reviewed the triage vital signs and the nursing  notes.  Pertinent labs & imaging results that were available during my care of the patient were reviewed by me and considered in my medical decision making (see chart for details).  Clinical Course    Pt would intermittently become extremely agitated and scream.  She was given 2 mg of ativan IM without change.  10 mg of geodon IM helped her calm down.  She is still; however, hallucinating.  For example, she told me she was an orphan because she just watched her mother die in the room.  She was also trying to move her bed to get it off of her brother who was stuck under it.    Pt also asked me to check her for STDs.  She wished to be treated for Gc and chl in case she is positive.  Pt requested robaxin and bentyl for her abdominal cramps from withdrawal sx.  Pt is not safe for d/c as she is actively psychotic.  TTS consult pending.  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 The recorded information has been reviewed and is accurate.    Jacalyn LefevreJulie Melanny Wire, MD 05/19/16 [phone]

## 2016-05-19 NOTE — ED Provider Notes (Signed)
Patient handoff from Dr. Particia NearingHaviland. She is agitated in hallway and appears confused. Given patient's history of cocaine use and hallucinations plan Ativan.   Margarita Grizzleanielle Damean Poffenberger, MD 05/23/16 [id number]

## 2016-05-19 NOTE — ED Notes (Signed)
Pt having outbursts of crying and cursing someone out. Calling out name that are not there. Pt ripped all cardiac monitor leads off and refuses to let staff put them back on.

## 2016-05-19 NOTE — ED Notes (Signed)
Mother called to check on pt, states this has happened before, please contact if needed [phone]

## 2016-05-19 NOTE — ED Notes (Signed)
Patient uncuffed from the stretcher to go to the bathroom.  Went after the officer's gun at this time.  Patient placed in handcuffs to both wrists and placed in shackels to lower extremities.

## 2016-05-19 NOTE — ED Notes (Signed)
Pt to aggitated and psychotic at this time for Iv stick. edp aware and orders received.

## 2016-05-19 NOTE — Progress Notes (Signed)
Attempted to secure placement at the following facilities:     [facility]    Jenaveve Fenstermaker, MSW, Tinnie GensLCSW, LCAS, CCSI [facility] Triage Specialist [phone]

## 2016-05-19 NOTE — ED Notes (Signed)
Tele psych in progress at this time. 

## 2016-05-20 MED ORDER — DOXYCYCLINE HYCLATE 100 MG PO TABS
ORAL_TABLET | ORAL | Status: AC
  Administered 2016-05-20: 100 mg
  Filled 2016-05-20: qty 1

## 2016-05-20 NOTE — ED Notes (Signed)
Called report, paper work complete, Engineer, miningfficer calling for back up needed in the department, when they arrive he will transport pt

## 2016-05-20 NOTE — Progress Notes (Signed)
Pt accepted to [facility] by Dr. Wendall StadeKohl. Report number is [phone], and pt can arrive anytime per Christiane HaJonathan in intake.  Asked that copies of IVC be sent to [facility] prior to transfer- Will fax copy.  Ilean SkillMeghan Ishitha Roper, MSW, LCSW Clinical Social Work, Disposition  05/20/2016 [phone]

## 2016-05-23 LAB — GC/CHLAMYDIA PROBE AMP (~~LOC~~) NOT AT ARMC
Chlamydia: NEGATIVE
NEISSERIA GONORRHEA: NEGATIVE

## 2016-05-31 ENCOUNTER — Encounter (HOSPITAL_COMMUNITY): Payer: Self-pay

## 2016-05-31 ENCOUNTER — Emergency Department (HOSPITAL_COMMUNITY)
Admission: EM | Admit: 2016-05-31 | Discharge: 2016-06-03 | Payer: No Typology Code available for payment source | Attending: Emergency Medicine | Admitting: Emergency Medicine

## 2016-05-31 DIAGNOSIS — F1910 Other psychoactive substance abuse, uncomplicated: Secondary | ICD-10-CM

## 2016-05-31 DIAGNOSIS — Z202 Contact with and (suspected) exposure to infections with a predominantly sexual mode of transmission: Secondary | ICD-10-CM

## 2016-05-31 DIAGNOSIS — F17210 Nicotine dependence, cigarettes, uncomplicated: Secondary | ICD-10-CM | POA: Insufficient documentation

## 2016-05-31 DIAGNOSIS — R440 Auditory hallucinations: Secondary | ICD-10-CM

## 2016-05-31 DIAGNOSIS — F312 Bipolar disorder, current episode manic severe with psychotic features: Secondary | ICD-10-CM | POA: Diagnosis present

## 2016-05-31 DIAGNOSIS — Z79899 Other long term (current) drug therapy: Secondary | ICD-10-CM | POA: Insufficient documentation

## 2016-05-31 DIAGNOSIS — N760 Acute vaginitis: Secondary | ICD-10-CM | POA: Insufficient documentation

## 2016-05-31 DIAGNOSIS — B9689 Other specified bacterial agents as the cause of diseases classified elsewhere: Secondary | ICD-10-CM

## 2016-05-31 LAB — COMPREHENSIVE METABOLIC PANEL
ALK PHOS: 60 U/L (ref 38–126)
ALT: 250 U/L — ABNORMAL HIGH (ref 14–54)
AST: 150 U/L — ABNORMAL HIGH (ref 15–41)
Albumin: 3.8 g/dL (ref 3.5–5.0)
Anion gap: 7 (ref 5–15)
BUN: 12 mg/dL (ref 6–20)
CALCIUM: 8.9 mg/dL (ref 8.9–10.3)
CHLORIDE: 100 mmol/L — AB (ref 101–111)
CO2: 26 mmol/L (ref 22–32)
Creatinine, Ser: 0.62 mg/dL (ref 0.44–1.00)
Glucose, Bld: 82 mg/dL (ref 65–99)
Potassium: 3.7 mmol/L (ref 3.5–5.1)
SODIUM: 133 mmol/L — AB (ref 135–145)
Total Bilirubin: 0.4 mg/dL (ref 0.3–1.2)
Total Protein: 6.6 g/dL (ref 6.5–8.1)

## 2016-05-31 LAB — CBC
HCT: 34.4 % — ABNORMAL LOW (ref 36.0–46.0)
Hemoglobin: 12 g/dL (ref 12.0–15.0)
MCH: 30.6 pg (ref 26.0–34.0)
MCHC: 34.9 g/dL (ref 30.0–36.0)
MCV: 87.8 fL (ref 78.0–100.0)
PLATELETS: 253 10*3/uL (ref 150–400)
RBC: 3.92 MIL/uL (ref 3.87–5.11)
RDW: 13.8 % (ref 11.5–15.5)
WBC: 10.7 10*3/uL — AB (ref 4.0–10.5)

## 2016-05-31 LAB — ETHANOL: Alcohol, Ethyl (B): <5 mg/dL (ref <5)

## 2016-05-31 LAB — RAPID URINE DRUG SCREEN, HOSP PERFORMED
AMPHETAMINES: NOT DETECTED
BENZODIAZEPINES: POSITIVE — AB
Barbiturates: NOT DETECTED
COCAINE: POSITIVE — AB
OPIATES: NOT DETECTED
Tetrahydrocannabinol: POSITIVE — AB

## 2016-05-31 LAB — SALICYLATE LEVEL

## 2016-05-31 MED ORDER — QUETIAPINE FUMARATE 100 MG PO TABS
ORAL_TABLET | ORAL | Status: AC
  Filled 2016-05-31: qty 3

## 2016-05-31 MED ORDER — CITALOPRAM HYDROBROMIDE 20 MG PO TABS
40.0000 mg | ORAL_TABLET | Freq: Every day | ORAL | Status: DC
  Administered 2016-06-01 – 2016-06-03 (×3): 40 mg via ORAL
  Filled 2016-05-31 (×5): qty 2
  Filled 2016-05-31: qty 1

## 2016-05-31 MED ORDER — HALOPERIDOL 2 MG PO TABS
2.0000 mg | ORAL_TABLET | Freq: Once | ORAL | Status: AC
  Administered 2016-05-31: 2 mg via ORAL
  Filled 2016-05-31: qty 1

## 2016-05-31 MED ORDER — MIRTAZAPINE 15 MG PO TBDP
ORAL_TABLET | ORAL | Status: AC
  Filled 2016-05-31: qty 1

## 2016-05-31 MED ORDER — TRAZODONE HCL 50 MG PO TABS
300.0000 mg | ORAL_TABLET | Freq: Every day | ORAL | Status: DC
  Administered 2016-05-31 – 2016-06-02 (×3): 300 mg via ORAL
  Filled 2016-05-31 (×2): qty 6

## 2016-05-31 MED ORDER — OLANZAPINE 5 MG PO TBDP
20.0000 mg | ORAL_TABLET | Freq: Two times a day (BID) | ORAL | Status: DC
  Administered 2016-05-31: 20 mg via ORAL
  Administered 2016-06-01: 5 mg via ORAL
  Administered 2016-06-02 – 2016-06-03 (×4): 20 mg via ORAL
  Filled 2016-05-31 (×5): qty 4

## 2016-05-31 MED ORDER — QUETIAPINE FUMARATE 100 MG PO TABS
300.0000 mg | ORAL_TABLET | Freq: Three times a day (TID) | ORAL | Status: DC
  Administered 2016-05-31 – 2016-06-03 (×8): 300 mg via ORAL
  Filled 2016-05-31 (×7): qty 3

## 2016-05-31 MED ORDER — GABAPENTIN 600 MG PO TABS
600.0000 mg | ORAL_TABLET | Freq: Three times a day (TID) | ORAL | Status: DC
  Administered 2016-05-31: 600 mg via ORAL
  Filled 2016-05-31 (×2): qty 1

## 2016-05-31 MED ORDER — LEVOTHYROXINE SODIUM 50 MCG PO TABS
50.0000 ug | ORAL_TABLET | Freq: Every morning | ORAL | Status: DC
  Administered 2016-06-01 – 2016-06-03 (×3): 50 ug via ORAL
  Filled 2016-05-31 (×3): qty 1

## 2016-05-31 MED ORDER — PERPHENAZINE 8 MG PO TABS
8.0000 mg | ORAL_TABLET | Freq: Three times a day (TID) | ORAL | Status: DC
  Administered 2016-05-31 – 2016-06-03 (×8): 8 mg via ORAL
  Filled 2016-05-31 (×17): qty 1

## 2016-05-31 MED ORDER — MIRTAZAPINE 15 MG PO TABS
15.0000 mg | ORAL_TABLET | Freq: Every day | ORAL | Status: DC
Start: 2016-05-31 — End: 2016-06-03
  Administered 2016-05-31 – 2016-06-02 (×3): 15 mg via ORAL
  Filled 2016-05-31 (×6): qty 1

## 2016-05-31 MED ORDER — NICOTINE 21 MG/24HR TD PT24
21.0000 mg | MEDICATED_PATCH | Freq: Every day | TRANSDERMAL | Status: DC
  Administered 2016-05-31 – 2016-06-03 (×3): 21 mg via TRANSDERMAL
  Filled 2016-05-31 (×4): qty 1

## 2016-05-31 MED ORDER — DICYCLOMINE HCL 10 MG PO CAPS
20.0000 mg | ORAL_CAPSULE | Freq: Three times a day (TID) | ORAL | Status: DC
  Administered 2016-06-01 – 2016-06-03 (×8): 20 mg via ORAL
  Filled 2016-05-31 (×8): qty 2

## 2016-05-31 MED ORDER — LORAZEPAM 1 MG PO TABS
1.0000 mg | ORAL_TABLET | Freq: Once | ORAL | Status: AC
  Administered 2016-05-31: 1 mg via ORAL
  Filled 2016-05-31: qty 1

## 2016-05-31 MED ORDER — HALOPERIDOL 2 MG PO TABS
ORAL_TABLET | ORAL | Status: AC
  Filled 2016-05-31: qty 1

## 2016-05-31 MED ORDER — BUSPIRONE HCL 15 MG PO TABS
30.0000 mg | ORAL_TABLET | Freq: Three times a day (TID) | ORAL | Status: DC
  Administered 2016-05-31 – 2016-06-03 (×8): 30 mg via ORAL
  Filled 2016-05-31 (×21): qty 2

## 2016-05-31 MED ORDER — GABAPENTIN 300 MG PO CAPS
ORAL_CAPSULE | ORAL | Status: AC
  Filled 2016-05-31: qty 2

## 2016-05-31 NOTE — ED Notes (Signed)
EDP at the bedside.  ?

## 2016-05-31 NOTE — ED Notes (Signed)
Assumed care of patient at this time. . Labs and urine ordered. Pt changed into paper scrubs. Belongings searched and place in belongings bag and labels and secured in nursing station.

## 2016-05-31 NOTE — ED Provider Notes (Addendum)
[facility]-EMERGENCY DEPT Provider Note   CSN: [id number] Arrival date & time: 05/31/16  1644     History   Chief Complaint Chief Complaint  Patient presents with  . Psychiatric Evaluation    HPI Becky [name] is a 26 y.o. female.  HPI  Patient presents today as after discharge from local psychiatric facility, now with concern for auditory hallucinations. Patient acknowledges a history of polysubstance abuse, states that since discharge she has smoked crack, taken Xanax. She states that she longer is interested in doing this, states that she spoke with another rehabilitation facility for placement. However, with concern for new auditory hallucination, but no suicidal ideation, no visual hallucinations, she presents here for evaluation  Past Medical History:  Diagnosis Date  . Bipolar 1 disorder (HCC)   . Bronchitis   . GAD (generalized anxiety disorder)   . Hepatitis C antibody test positive 06/17/2015  . History of substance abuse 06/16/2015  . OCD (obsessive compulsive disorder)   . Peptic ulcer   . Polysubstance abuse   . PTSD (post-traumatic stress disorder)     Patient Active Problem List   Diagnosis Date Noted  . Bipolar I disorder, most recent episode depressed (HCC) 07/04/2015  . Overdose 07/02/2015  . Lactic acidosis 07/02/2015  . Acute respiratory failure (HCC) 07/02/2015  . Sepsis (HCC) 07/02/2015  . AKI (acute kidney injury) (HCC)   . Altered mental status   . Pyrexia   . Abdominal pain, epigastric 06/17/2015  . Hepatitis C antibody test positive 06/17/2015  . Peptic ulcer disease 06/17/2015  . Epigastric pain   . Transaminitis 06/16/2015  . History of substance abuse 06/16/2015  . Bipolar affective disorder, current episode manic with psychotic symptoms (HCC) 03/03/2014    Past Surgical History:  Procedure Laterality Date  . OTHER SURGICAL HISTORY     scar tissue removed from right ovary   . OTHER SURGICAL HISTORY     fallopian tube repair  .  WISDOM TOOTH EXTRACTION      OB History    Gravida Para Term Preterm AB Living   2       1 1    SAB TAB Ectopic Multiple Live Births                   Home Medications    Prior to Admission medications   Medication Sig Start Date End Date Taking? Authorizing Provider  citalopram (CELEXA) 40 MG tablet Take 40 mg by mouth daily.    Historical Provider, MD  gabapentin (NEURONTIN) 600 MG tablet Take 600 mg by mouth 3 (three) times daily.    Historical Provider, MD  levothyroxine (SYNTHROID, LEVOTHROID) 50 MCG tablet Take 50 mcg by mouth every morning.    Historical Provider, MD  mirtazapine (REMERON) 15 MG tablet Take 15 mg by mouth at bedtime.    Historical Provider, MD  QUEtiapine (SEROQUEL) 300 MG tablet Take 300 mg by mouth 3 (three) times daily.    Historical Provider, MD  traZODone (DESYREL) 150 MG tablet Take 150 mg by mouth at bedtime.    Historical Provider, MD    Family History Family History  Problem Relation Age of Onset  . Drug abuse Brother   . Cancer Other     Social History Social History  Substance Use Topics  . Smoking status: Current Every Day Smoker    Packs/day: 1.00    Years: 10.00    Types: Cigarettes  . Smokeless tobacco: Never Used  . Alcohol use Yes  Comment: occ     Allergies   Azithromycin and Nsaids   Review of Systems Review of Systems   Physical Exam Updated Vital Signs BP 120/77 (BP Location: Left Arm)   Pulse 120   Temp 98.6 F (37 C) (Oral)   Resp 18   Ht 5\' 2"  (1.575 m)   Wt 140 lb (63.5 kg)   LMP 05/15/2016 (Exact Date)   SpO2 98%   BMI 25.61 kg/m   Physical Exam  Constitutional: She is oriented to person, place, and time. She appears well-developed and well-nourished. No distress.  HENT:  Head: Normocephalic and atraumatic.  Eyes: Conjunctivae and EOM are normal.  Cardiovascular: Normal rate and regular rhythm.   Pulmonary/Chest: Effort normal and breath sounds normal. No stridor. No respiratory distress.    Abdominal: She exhibits no distension.  Musculoskeletal: She exhibits no edema.  Neurological: She is alert and oriented to person, place, and time. No cranial nerve deficit.  Skin: Skin is warm and dry.  Psychiatric:  anxious  Nursing note and vitals reviewed.    ED Treatments / Results  Labs (all labs ordered are listed, but only abnormal results are displayed) Labs Reviewed  COMPREHENSIVE METABOLIC PANEL - Abnormal; Notable for the following:       Result Value   Sodium 133 (*)    Chloride 100 (*)    AST 150 (*)    ALT 250 (*)    All other components within normal limits  CBC - Abnormal; Notable for the following:    WBC 10.7 (*)    HCT 34.4 (*)    All other components within normal limits  RAPID URINE DRUG SCREEN, HOSP PERFORMED - Abnormal; Notable for the following:    Cocaine POSITIVE (*)    Benzodiazepines POSITIVE (*)    Tetrahydrocannabinol POSITIVE (*)    All other components within normal limits  ETHANOL  SALICYLATE LEVEL    Procedures Procedures (including critical care time)  Medications Ordered in ED Medications  LORazepam (ATIVAN) tablet 1 mg (not administered)     Initial Impression / Assessment and Plan / ED Course  I have reviewed the triage vital signs and the nursing notes.  Pertinent labs & imaging results that were available during my care of the patient were reviewed by me and considered in my medical decision making (see chart for details).   8:29 PM Patient awake and alert, She states that she continues to feel anxiousness. I discussed for medication for her. She states that she has not been taking any of her multiple medications reliably over the past 3 week, during her recent inpatient treatment center stay. I discussed importance of upcoming psychiatry consult, with consideration of medication advice. Patient states that Haldol works for her well as anxiolytic.   Clinical Course   Young female with history of polysubstance abuse,  recent admission to rehabilitation facility presents with concern of auditory hallucinations, and relapse into substance abuse. Patient does not have suicidal ideation, but with her psychiatric history, as well as ongoing auditory hallucinations she was medically cleared for psychiatric evaluation.   Gerhard Munchobert Shasta Chinn, MD 05/31/16 1818    Gerhard Munchobert Pamala Hayman, MD 05/31/16 2030    Gerhard Munchobert Elecia Serafin, MD 06/08/16 1723

## 2016-05-31 NOTE — BH Assessment (Signed)
[name], Becky [name], Becky [name], Becky [name], Becky [name]

## 2016-05-31 NOTE — ED Triage Notes (Signed)
Patient brought in by Becky [name] for psychiatric evaluation. States she did crack 3 hours ago and wants help now. [facility]. Denies SI/HI. Reports of hallucinations.

## 2016-05-31 NOTE — ED Notes (Signed)
PT states she wants detox from meth/cocaine/anxiety/opoid medication. PT states she is having auditory hallucinations and [facility] days ago. PT tearful during triage.

## 2016-05-31 NOTE — ED Notes (Signed)
Allowed pt to call mother to inform her that pt would more than likely be staying in hospital tonight
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
## 2016-05-31 NOTE — ED Notes (Signed)
Pt states she is hearing voices at this time

## 2016-06-01 LAB — URINALYSIS, ROUTINE W REFLEX MICROSCOPIC
Bilirubin Urine: NEGATIVE
GLUCOSE, UA: NEGATIVE mg/dL
HGB URINE DIPSTICK: NEGATIVE
Ketones, ur: NEGATIVE mg/dL
Leukocytes, UA: NEGATIVE
Nitrite: NEGATIVE
PH: 7 (ref 5.0–8.0)
Protein, ur: NEGATIVE mg/dL
SPECIFIC GRAVITY, URINE: 1.01 (ref 1.005–1.030)

## 2016-06-01 LAB — WET PREP, GENITAL
Sperm: NONE SEEN
TRICH WET PREP: NONE SEEN
YEAST WET PREP: NONE SEEN

## 2016-06-01 LAB — PREGNANCY, URINE: PREG TEST UR: NEGATIVE

## 2016-06-01 MED ORDER — DOXYCYCLINE HYCLATE 100 MG PO TABS
100.0000 mg | ORAL_TABLET | Freq: Two times a day (BID) | ORAL | Status: DC
  Administered 2016-06-01 – 2016-06-03 (×5): 100 mg via ORAL
  Filled 2016-06-01 (×5): qty 1

## 2016-06-01 MED ORDER — HYDROXYZINE HCL 25 MG PO TABS
50.0000 mg | ORAL_TABLET | Freq: Once | ORAL | Status: AC
  Administered 2016-06-01: 50 mg via ORAL
  Filled 2016-06-01: qty 2

## 2016-06-01 MED ORDER — METRONIDAZOLE 500 MG PO TABS
500.0000 mg | ORAL_TABLET | Freq: Two times a day (BID) | ORAL | Status: DC
  Administered 2016-06-01 – 2016-06-03 (×5): 500 mg via ORAL
  Filled 2016-06-01 (×5): qty 1

## 2016-06-01 MED ORDER — GABAPENTIN 300 MG PO CAPS
600.0000 mg | ORAL_CAPSULE | Freq: Three times a day (TID) | ORAL | Status: DC
  Administered 2016-06-01 – 2016-06-03 (×7): 600 mg via ORAL
  Filled 2016-06-01 (×7): qty 2

## 2016-06-01 MED ORDER — STERILE WATER FOR INJECTION IJ SOLN
INTRAMUSCULAR | Status: AC
  Filled 2016-06-01: qty 10

## 2016-06-01 MED ORDER — CEFTRIAXONE SODIUM 250 MG IJ SOLR
250.0000 mg | Freq: Once | INTRAMUSCULAR | Status: AC
  Administered 2016-06-01: 250 mg via INTRAMUSCULAR
  Filled 2016-06-01: qty 250

## 2016-06-01 NOTE — ED Notes (Signed)
Pt reports vaginal discharge, pelvic pain, recent unprotected sex with boyfriend. Pt also reports "i found out my boyfriend was cheating on me too." EDP aware.

## 2016-06-01 NOTE — B[facility] Counselor (Addendum)
Per Clint Bolderori Beck, AC: [facility]. Accepting doctor is Dr. Jama Flavorsobos.   Advised nurse, Juliette AlcideMelinda.    Becky FlockMary Bernardo Brayman, MS, CRC, [facility] Triage Specialist [facility]

## 2016-06-01 NOTE — ED Notes (Signed)
Buspar 30mg  dose not loaded in ER pyxis or Fast Track pyxis. Pharmacy aware and reported would bring dose down.

## 2016-06-01 NOTE — ED Notes (Addendum)
Pharmacy consulted regarding trilafon dose. Pt taking dose from home medication. Pharmacy reported that this medication did not have to be inventoried and that as long as medication was verified to be right pill and in prescription bottle, medication could be kept with pt chart or in medication pyxis.   Pill and prescription bottle verified prior to administration.

## 2016-06-01 NOTE — ED Notes (Signed)
Received report from Leslie Cardwell, RN 
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
## 2016-06-01 NOTE — ED Notes (Signed)
Pt requesting something to help with anxiety.  When informed pt that Dr had ordered 50mg  vistaril, pt became upset and asked "why not?!"  Informed pt that Dr. Wilkie AyeHorton did not want to prescribe anymore psych medications at this time.  Pt states "Vistaril doesn't do nothin, it's like taking benadryl"

## 2016-06-01 NOTE — ED Notes (Signed)
[facility] but can not be transferred until after 08:30

## 2016-06-02 DIAGNOSIS — F312 Bipolar disorder, current episode manic severe with psychotic features: Secondary | ICD-10-CM | POA: Diagnosis not present

## 2016-06-02 DIAGNOSIS — Z813 Family history of other psychoactive substance abuse and dependence: Secondary | ICD-10-CM | POA: Diagnosis not present

## 2016-06-02 DIAGNOSIS — Z9889 Other specified postprocedural states: Secondary | ICD-10-CM

## 2016-06-02 DIAGNOSIS — Z888 Allergy status to other drugs, medicaments and biological substances status: Secondary | ICD-10-CM

## 2016-06-02 DIAGNOSIS — Z808 Family history of malignant neoplasm of other organs or systems: Secondary | ICD-10-CM | POA: Diagnosis not present

## 2016-06-02 DIAGNOSIS — R45851 Suicidal ideations: Secondary | ICD-10-CM

## 2016-06-02 DIAGNOSIS — Z79899 Other long term (current) drug therapy: Secondary | ICD-10-CM

## 2016-06-02 DIAGNOSIS — F17210 Nicotine dependence, cigarettes, uncomplicated: Secondary | ICD-10-CM

## 2016-06-02 LAB — GC/CHLAMYDIA PROBE AMP (~~LOC~~) NOT AT ARMC
Chlamydia: NEGATIVE
NEISSERIA GONORRHEA: NEGATIVE

## 2016-06-02 MED ORDER — ALUM & MAG HYDROXIDE-SIMETH 200-200-20 MG/5ML PO SUSP
30.0000 mL | ORAL | Status: DC | PRN
  Administered 2016-06-02 – 2016-06-03 (×2): 30 mL via ORAL
  Filled 2016-06-02 (×2): qty 30

## 2016-06-02 MED ORDER — ACETAMINOPHEN 325 MG PO TABS
650.0000 mg | ORAL_TABLET | ORAL | Status: DC | PRN
  Administered 2016-06-02: 650 mg via ORAL
  Filled 2016-06-02: qty 2

## 2016-06-02 MED ORDER — HYDROXYZINE HCL 25 MG PO TABS
50.0000 mg | ORAL_TABLET | Freq: Four times a day (QID) | ORAL | Status: DC | PRN
  Administered 2016-06-02 – 2016-06-03 (×3): 50 mg via ORAL
  Filled 2016-06-02 (×3): qty 2

## 2016-06-02 MED ORDER — NICOTINE 21 MG/24HR TD PT24
21.0000 mg | MEDICATED_PATCH | Freq: Every day | TRANSDERMAL | Status: DC | PRN
  Administered 2016-06-02: 21 mg via TRANSDERMAL

## 2016-06-02 MED ORDER — HYDROXYZINE HCL 25 MG PO TABS
50.0000 mg | ORAL_TABLET | Freq: Once | ORAL | Status: AC
  Administered 2016-06-02: 50 mg via ORAL
  Filled 2016-06-02: qty 2

## 2016-06-02 MED ORDER — IBUPROFEN 800 MG PO TABS
800.0000 mg | ORAL_TABLET | Freq: Once | ORAL | Status: AC
  Administered 2016-06-02: 800 mg via ORAL
  Filled 2016-06-02: qty 1

## 2016-06-02 MED ORDER — POLYETHYLENE GLYCOL 3350 17 G PO PACK
17.0000 g | PACK | Freq: Every day | ORAL | Status: DC | PRN
  Administered 2016-06-02: 17 g via ORAL
  Filled 2016-06-02: qty 1

## 2016-06-02 NOTE — ED Notes (Signed)
Patient given meal tray.
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
## 2016-06-02 NOTE — ED Notes (Signed)
Pt provided with lunch tray.  Denies any needs.

## 2016-06-02 NOTE — ED Notes (Signed)
Called to room by patient. Patient eating meal tray at this time. Requesting "something for my nerves." Patient has tremors noted at this time.

## 2016-06-02 NOTE — ED Notes (Signed)
Called to patient's room due to patient requesting pen to write down the patient in room #15 phone number. Patient in room 15 is IVC at this time. Sitter states patient in room 15 is writing a $1000 check for patient in room #17. Advised patient that she could not have communication with other patients and that the patient in room 17 would not be giving her a check. Patient in room 17 states "I'm writing [place] a check." Advised patient he could not given other patient a check. Patient states "I was just joking with him, I didn't think he would actually write me a check." Security called to room 17 to remove pen and other personal items.

## 2016-06-03 ENCOUNTER — Inpatient Hospital Stay (HOSPITAL_COMMUNITY)
Admission: EM | Admit: 2016-06-03 | Discharge: 2016-06-08 | DRG: 885 | Payer: No Typology Code available for payment source | Source: Intra-hospital | Attending: Psychiatry | Admitting: Psychiatry

## 2016-06-03 DIAGNOSIS — Z811 Family history of alcohol abuse and dependence: Secondary | ICD-10-CM

## 2016-06-03 DIAGNOSIS — Z8711 Personal history of peptic ulcer disease: Secondary | ICD-10-CM | POA: Diagnosis not present

## 2016-06-03 DIAGNOSIS — F1420 Cocaine dependence, uncomplicated: Secondary | ICD-10-CM | POA: Diagnosis present

## 2016-06-03 DIAGNOSIS — F17210 Nicotine dependence, cigarettes, uncomplicated: Secondary | ICD-10-CM | POA: Diagnosis present

## 2016-06-03 DIAGNOSIS — Z813 Family history of other psychoactive substance abuse and dependence: Secondary | ICD-10-CM | POA: Diagnosis not present

## 2016-06-03 DIAGNOSIS — F1120 Opioid dependence, uncomplicated: Secondary | ICD-10-CM | POA: Diagnosis present

## 2016-06-03 DIAGNOSIS — Z818 Family history of other mental and behavioral disorders: Secondary | ICD-10-CM

## 2016-06-03 DIAGNOSIS — F332 Major depressive disorder, recurrent severe without psychotic features: Secondary | ICD-10-CM | POA: Diagnosis present

## 2016-06-03 DIAGNOSIS — F1320 Sedative, hypnotic or anxiolytic dependence, uncomplicated: Secondary | ICD-10-CM | POA: Diagnosis not present

## 2016-06-03 DIAGNOSIS — Z809 Family history of malignant neoplasm, unspecified: Secondary | ICD-10-CM | POA: Diagnosis not present

## 2016-06-03 DIAGNOSIS — F1220 Cannabis dependence, uncomplicated: Secondary | ICD-10-CM | POA: Diagnosis present

## 2016-06-03 MED ORDER — CITALOPRAM HYDROBROMIDE 10 MG PO TABS
10.0000 mg | ORAL_TABLET | Freq: Every day | ORAL | Status: DC
  Administered 2016-06-04 – 2016-06-06 (×3): 10 mg via ORAL
  Filled 2016-06-03 (×5): qty 1

## 2016-06-03 MED ORDER — GABAPENTIN 300 MG PO CAPS
600.0000 mg | ORAL_CAPSULE | Freq: Three times a day (TID) | ORAL | Status: DC
  Administered 2016-06-03 – 2016-06-05 (×5): 600 mg via ORAL
  Filled 2016-06-03 (×13): qty 2

## 2016-06-03 MED ORDER — ONDANSETRON 4 MG PO TBDP
8.0000 mg | ORAL_TABLET | Freq: Three times a day (TID) | ORAL | Status: DC | PRN
  Administered 2016-06-03: 8 mg via ORAL
  Filled 2016-06-03: qty 2

## 2016-06-03 MED ORDER — TRAZODONE HCL 150 MG PO TABS
300.0000 mg | ORAL_TABLET | Freq: Every day | ORAL | Status: DC
  Filled 2016-06-03: qty 2

## 2016-06-03 MED ORDER — CITALOPRAM HYDROBROMIDE 40 MG PO TABS
40.0000 mg | ORAL_TABLET | Freq: Every day | ORAL | Status: DC
  Filled 2016-06-03: qty 1

## 2016-06-03 MED ORDER — TRAZODONE HCL 50 MG PO TABS
50.0000 mg | ORAL_TABLET | Freq: Every day | ORAL | Status: DC
  Administered 2016-06-03: 50 mg via ORAL
  Filled 2016-06-03 (×4): qty 1

## 2016-06-03 MED ORDER — OLANZAPINE 10 MG PO TBDP
20.0000 mg | ORAL_TABLET | Freq: Two times a day (BID) | ORAL | Status: DC
  Filled 2016-06-03 (×2): qty 2

## 2016-06-03 MED ORDER — OLANZAPINE 10 MG PO TBDP
10.0000 mg | ORAL_TABLET | Freq: Two times a day (BID) | ORAL | Status: DC
  Administered 2016-06-03 – 2016-06-04 (×2): 10 mg via ORAL
  Filled 2016-06-03 (×7): qty 1

## 2016-06-03 MED ORDER — QUETIAPINE FUMARATE 300 MG PO TABS
300.0000 mg | ORAL_TABLET | Freq: Three times a day (TID) | ORAL | Status: DC
  Administered 2016-06-03 – 2016-06-05 (×5): 300 mg via ORAL
  Filled 2016-06-03 (×12): qty 1

## 2016-06-03 MED ORDER — LEVOTHYROXINE SODIUM 50 MCG PO TABS
50.0000 ug | ORAL_TABLET | Freq: Every morning | ORAL | Status: DC
  Administered 2016-06-04 – 2016-06-08 (×5): 50 ug via ORAL
  Filled 2016-06-03 (×3): qty 1
  Filled 2016-06-03: qty 2
  Filled 2016-06-03 (×3): qty 1

## 2016-06-03 MED ORDER — BUSPIRONE HCL 5 MG PO TABS
30.0000 mg | ORAL_TABLET | Freq: Three times a day (TID) | ORAL | Status: DC
  Filled 2016-06-03 (×2): qty 6

## 2016-06-03 MED ORDER — BENZTROPINE MESYLATE 1 MG PO TABS
1.0000 mg | ORAL_TABLET | Freq: Every day | ORAL | Status: DC
  Administered 2016-06-03 – 2016-06-07 (×5): 1 mg via ORAL
  Filled 2016-06-03 (×8): qty 1

## 2016-06-03 MED ORDER — DICYCLOMINE HCL 10 MG PO CAPS
20.0000 mg | ORAL_CAPSULE | Freq: Three times a day (TID) | ORAL | Status: DC
  Administered 2016-06-04 – 2016-06-08 (×13): 20 mg via ORAL
  Filled 2016-06-03 (×19): qty 2

## 2016-06-03 MED ORDER — PERPHENAZINE 8 MG PO TABS
8.0000 mg | ORAL_TABLET | Freq: Three times a day (TID) | ORAL | Status: DC
  Administered 2016-06-03 – 2016-06-04 (×3): 8 mg via ORAL
  Filled 2016-06-03 (×2): qty 1
  Filled 2016-06-03: qty 2
  Filled 2016-06-03 (×6): qty 1

## 2016-06-03 MED ORDER — HYDROXYZINE HCL 50 MG PO TABS: 50.0000 mg | ORAL_TABLET | Freq: Four times a day (QID) | ORAL | Status: DC | PRN

## 2016-06-03 MED ORDER — DOXYCYCLINE HYCLATE 100 MG PO TABS
100.0000 mg | ORAL_TABLET | Freq: Two times a day (BID) | ORAL | Status: AC
  Administered 2016-06-03 – 2016-06-07 (×9): 100 mg via ORAL
  Filled 2016-06-03 (×12): qty 1

## 2016-06-03 MED ORDER — ALUM & MAG HYDROXIDE-SIMETH 200-200-20 MG/5ML PO SUSP
30.0000 mL | ORAL | Status: DC | PRN
  Administered 2016-06-03 – 2016-06-08 (×5): 30 mL via ORAL
  Filled 2016-06-03 (×6): qty 30

## 2016-06-03 MED ORDER — ACETAMINOPHEN 325 MG PO TABS
650.0000 mg | ORAL_TABLET | Freq: Four times a day (QID) | ORAL | Status: DC | PRN
  Administered 2016-06-03 – 2016-06-06 (×9): 650 mg via ORAL
  Filled 2016-06-03 (×10): qty 2

## 2016-06-03 MED ORDER — POLYETHYLENE GLYCOL 3350 17 G PO PACK: 17.0000 g | PACK | Freq: Every day | ORAL | Status: DC | PRN

## 2016-06-03 MED ORDER — NICOTINE POLACRILEX 2 MG MT GUM
2.0000 mg | CHEWING_GUM | OROMUCOSAL | Status: DC | PRN
  Administered 2016-06-03 – 2016-06-08 (×41): 2 mg via ORAL
  Filled 2016-06-03 (×16): qty 1

## 2016-06-03 MED ORDER — MAGNESIUM HYDROXIDE 400 MG/5ML PO SUSP
30.0000 mL | Freq: Every day | ORAL | Status: DC | PRN
  Administered 2016-06-04 – 2016-06-07 (×3): 30 mL via ORAL
  Filled 2016-06-03 (×3): qty 30

## 2016-06-03 MED ORDER — MIRTAZAPINE 15 MG PO TABS
15.0000 mg | ORAL_TABLET | Freq: Every day | ORAL | Status: DC
  Filled 2016-06-03: qty 1

## 2016-06-03 MED ORDER — MIRTAZAPINE 15 MG PO TABS
15.0000 mg | ORAL_TABLET | Freq: Every day | ORAL | Status: DC
  Administered 2016-06-03 – 2016-06-05 (×3): 15 mg via ORAL
  Filled 2016-06-03 (×8): qty 1

## 2016-06-03 MED ORDER — BUSPIRONE HCL 15 MG PO TABS
7.5000 mg | ORAL_TABLET | Freq: Two times a day (BID) | ORAL | Status: DC
  Administered 2016-06-03 – 2016-06-04 (×2): 7.5 mg via ORAL
  Filled 2016-06-03 (×6): qty 1
  Filled 2016-06-03: qty 2

## 2016-06-03 MED ORDER — METRONIDAZOLE 500 MG PO TABS
500.0000 mg | ORAL_TABLET | Freq: Two times a day (BID) | ORAL | Status: AC
  Administered 2016-06-03 – 2016-06-07 (×9): 500 mg via ORAL
  Filled 2016-06-03: qty 1
  Filled 2016-06-03: qty 2
  Filled 2016-06-03 (×5): qty 1
  Filled 2016-06-03: qty 2
  Filled 2016-06-03 (×2): qty 1
  Filled 2016-06-03: qty 2
  Filled 2016-06-03: qty 1

## 2016-06-03 MED ORDER — HYDROXYZINE HCL 25 MG PO TABS
25.0000 mg | ORAL_TABLET | Freq: Four times a day (QID) | ORAL | Status: DC | PRN
  Administered 2016-06-03 – 2016-06-04 (×2): 25 mg via ORAL
  Administered 2016-06-04: 50 mg via ORAL
  Administered 2016-06-04: 25 mg via ORAL
  Filled 2016-06-03 (×6): qty 1

## 2016-06-03 NOTE — ED Provider Notes (Signed)
[facility]. Will transfer stable.    Samuel JesterKathleen Brysyn Brandenberger, DO 06/03/16 1131

## 2016-06-03 NOTE — Progress Notes (Signed)
Report received from admitting nurse. Patient denies SI/HI/AVH and pain at this time. Orders received and acknowledged.  Medications administered as per order. Patient verbally contracts for safety on the unit and agrees to come to staff before acting on any self harm thoughts/feelings and other needs or concerns. 15 min checks initiated for safety and patient oriented to the unit and the unit schedule. 

## 2016-06-03 NOTE — ED Notes (Signed)
Pt stable and ready [facility].  Report given to Wilhemina CashPenny Caitei, RN.

## 2016-06-03 NOTE — Progress Notes (Addendum)
Patient attended AA group meeting.  

## 2016-06-03 NOTE — Progress Notes (Signed)
Patient ID: Becky PancoastDanielle [name], female   DOB: 01/27/1991, 26 y.o.   MRN: [id number]   Pt currently presents with a flat affect and restless behavior. Mood is labile. Pt reports to Clinical research associatewriter that their goal is to "fix my medicines." Pt states "I need to get back on everything to get my serotonin levels up." Pt reports good sleep with current medication regimen. Reports symptoms of withdrawal including nausea, anxiety, agitation and generalized pains.   Pt provided with medications per providers orders. Pt's labs and vitals were monitored throughout the night. Pt given a 1:1 about concerns,  supported emotionally and encouraged to express concerns and questions to MD/providers. Pt educated on medications and side effects.   Pt's safety ensured with 15 minute and environmental checks. Pt currently denies SI/HI and visual hallucinations. Endorses auditory hallucinations of "voices", denies that they are command in nature. Pt verbally agrees to seek staff if SI/HI or VH occurs, AH worsens and to consult with staff before acting on any harmful thoughts. Will continue POC.

## 2016-06-03 NOTE — ED Notes (Signed)
Pt sleeping at this time. Tray sat at bedside

## 2016-06-03 NOTE — ED Notes (Signed)
[facility] here to transport 

## 2016-06-04 ENCOUNTER — Encounter (HOSPITAL_COMMUNITY): Payer: Self-pay | Admitting: *Deleted

## 2016-06-04 DIAGNOSIS — Z888 Allergy status to other drugs, medicaments and biological substances status: Secondary | ICD-10-CM

## 2016-06-04 DIAGNOSIS — F1290 Cannabis use, unspecified, uncomplicated: Secondary | ICD-10-CM

## 2016-06-04 DIAGNOSIS — F1190 Opioid use, unspecified, uncomplicated: Secondary | ICD-10-CM

## 2016-06-04 DIAGNOSIS — F1390 Sedative, hypnotic, or anxiolytic use, unspecified, uncomplicated: Secondary | ICD-10-CM

## 2016-06-04 DIAGNOSIS — R45851 Suicidal ideations: Secondary | ICD-10-CM

## 2016-06-04 DIAGNOSIS — F1490 Cocaine use, unspecified, uncomplicated: Secondary | ICD-10-CM

## 2016-06-04 MED ORDER — TRAZODONE HCL 50 MG PO TABS
50.0000 mg | ORAL_TABLET | Freq: Every evening | ORAL | Status: DC | PRN
  Administered 2016-06-04: 50 mg via ORAL
  Filled 2016-06-04: qty 1

## 2016-06-04 MED ORDER — QUETIAPINE FUMARATE 50 MG PO TABS
50.0000 mg | ORAL_TABLET | Freq: Three times a day (TID) | ORAL | Status: DC | PRN
  Administered 2016-06-04 – 2016-06-06 (×6): 50 mg via ORAL
  Filled 2016-06-04 (×6): qty 1

## 2016-06-04 MED ORDER — PANTOPRAZOLE SODIUM 20 MG PO TBEC
20.0000 mg | DELAYED_RELEASE_TABLET | Freq: Every day | ORAL | Status: DC
  Administered 2016-06-04 – 2016-06-08 (×5): 20 mg via ORAL
  Filled 2016-06-04 (×8): qty 1

## 2016-06-04 MED ORDER — HYDROXYZINE HCL 50 MG PO TABS
ORAL_TABLET | ORAL | Status: AC
  Administered 2016-06-04: 50 mg via ORAL
  Filled 2016-06-04: qty 1

## 2016-06-04 MED ORDER — HYDROXYZINE HCL 50 MG PO TABS
50.0000 mg | ORAL_TABLET | Freq: Once | ORAL | Status: AC
  Administered 2016-06-04: 50 mg via ORAL

## 2016-06-04 MED ORDER — NICOTINE POLACRILEX 2 MG MT GUM
CHEWING_GUM | OROMUCOSAL | Status: AC
  Administered 2016-06-04: 2 mg
  Filled 2016-06-04: qty 1

## 2016-06-04 NOTE — Progress Notes (Signed)
Patient did not attend AA group meeting. 

## 2016-06-04 NOTE — Progress Notes (Signed)
Patient ID: [name], female   DOB: 06/27/1990, 26 y.o.   MRN: [id number]  Pt wakes up multiple times throughout the night, requesting medications or discussing medications. This Clinical research associatewriter tells patient "you should try to go back to bed, you look sleepy" to which patient responds "yeah, I am half asleep but can I have a nicotine gum?" Pt redirected to room multiple times. Pt falls back asleep at these times.

## 2016-06-04 NOTE — [facility] Suicide Risk Assessment (Signed)
[facility]   Nursing information obtained from:   team meeting, chart review Demographic factors:   n/a Current Mental Status:   depressed Loss Factors:   grandmother deceased 3 weeks ago,  Historical Factors:   tried to cut herself in the past, brother hang himself Risk Reduction Factors:   lives with her mother  Total Time spent with patient: 30 minutes Principal Problem: MDD (major depressive disorder), recurrent severe, without psychosis (HCC) Diagnosis:   Patient Active Problem List   Diagnosis Date Noted  . MDD (major depressive disorder), recurrent severe, without psychosis (HCC) [F33.2] 06/03/2016  . Bipolar I disorder, most recent episode depressed (HCC) [F31.30] 07/04/2015  . Overdose [T50.901A] 07/02/2015  . Lactic acidosis [E87.2] 07/02/2015  . Acute respiratory failure (HCC) [J96.00] 07/02/2015  . Sepsis (HCC) [A41.9] 07/02/2015  . AKI (acute kidney injury) (HCC) [N17.9]   . Altered mental status [R41.82]   . Pyrexia [R50.9]   . Abdominal pain, epigastric [R10.13] 06/17/2015  . Hepatitis C antibody test positive [R76.8] 06/17/2015  . Peptic ulcer disease [K27.9] 06/17/2015  . Epigastric pain [R10.13]   . Transaminitis [R74.0] 06/16/2015  . History of substance abuse [Z87.898] 06/16/2015  . Bipolar affective disorder, current episode manic with psychotic symptoms (HCC) [F31.2] 03/03/2014   Subjective Data:  Becky [name] is a 26 year old female with polysubstance abuse, who initially presented to ED with complainingofhallucinations and paranoia in the setting of cocaine, methamphetamine, suboxone and Xanax use.   Patient states that she has been using cocaine, and Xanax 10 mg per day, last use a couple of weeks ago. She has AH of whisper and feels anxious since admission. She asked to have prn medication in addition to her current regimen. She reports that she has been on olanzapine, perphenazine, quetiapine for many years, although she  has been out of medication for the past 3 months. She denies SI. She denies VH. She reports mild tremors.   Continued Clinical Symptoms:  Alcohol Use Disorder Identification Test Final Score (AUDIT): 0 The "Alcohol Use Disorders Identification Test", Guidelines for Use in Primary Care, Second Edition.  World Science writerHealth Organization [facility](WHO). Score between 0-7:  no or low risk or alcohol related problems. Score between 8-15:  moderate risk of alcohol related problems. Score between 16-19:  high risk of alcohol related problems. Score 20 or above:  warrants further diagnostic evaluation for alcohol dependence and treatment.   CLINICAL FACTORS:   Depression:   Anhedonia   Musculoskeletal: Strength & Muscle Tone: within normal limits Gait & Station: normal Patient leans: N/A  Psychiatric Specialty Exam: Physical Exam  Nursing note and vitals reviewed.   Review of Systems  Psychiatric/Behavioral: Positive for depression, hallucinations and substance abuse. Negative for suicidal ideas. The patient is nervous/anxious and has insomnia.   All other systems reviewed and are negative.   Blood pressure 110/74, pulse 84, temperature 98 F (36.7 C), resp. rate 16, last menstrual period 05/15/2016.There is no height or weight on file to calculate BMI.  General Appearance: Fairly Groomed  Eye Contact:  Fair  Speech:  Clear and Coherent  Volume:  Normal  Mood:  Anxious  Affect:  Restricted  Thought Process:  Coherent but inconsistent at times  Orientation:  Full (Time, Place, and Person)  Thought Content:  Logical Perceptions: AH of voices, denies VH  Suicidal Thoughts:  No  Homicidal Thoughts:  No  Memory:  Immediate;   Good Recent;   Good Remote;   Good  Judgement:  Impaired  Insight:  Shallow  Psychomotor Activity:  Increased  Concentration:  Concentration: Good and Attention Span: Good  Recall:  Good  Fund of Knowledge:  Good  Language:  Good  Akathisia:  Yes  Handed:  Right  AIMS  (if indicated):     Assets:  Communication Skills Desire for Improvement  ADL's:  Intact  Cognition:  WNL  Sleep:  Number of Hours: 5.25      COGNITIVE FEATURES THAT CONTRIBUTE TO RISK:  Closed-mindedness    SUICIDE RISK:   Mild:  Suicidal ideation of limited frequency, intensity, duration, and specificity.  There are no identifiable plans, no associated intent, mild dysphoria and related symptoms, good self-control (both objective and subjective assessment), few other risk factors, and identifiable protective factors, including available and accessible social support.  PLAN OF CARE: Patient will be admitted to inpatient psychiatric unit for stabilization and safety. Will provide and encourage milieu participation. Provide medication management and maked adjustments as needed.  Will follow daily.   I certify that inpatient services furnished can reasonably be expected to improve the patient's condition.   Neysa Hotter, MD 06/04/2016, 4:39 PM

## 2016-06-04 NOTE — Progress Notes (Signed)
D) Pt attended the morning groups, but walked out of the afternoon group. Pt approaches staff frequently with numerous needs. This morning Pt was anxious and agitated, wanting to speak with the NP or Doctor immediately. Wanted this nurse to adjust her medications and became upset when I assured her I could not. Pt rates her depression at an 8, hopelessness at a 10 and her anxiety at a 9. Denies SI and HI. Having trouble tolerating any discomfort at all and when Pt feels discomfort, requests a pill. A) Given frequent support and reassurance. Numerous 1:1's throughout the day. Used therapeutic responses with Pt. Gently confronted Pt when she is overly demanding. R) Pt denies SI and HI. Demanding at times.
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
## 2016-06-04 NOTE — [facility] Group Notes (Signed)
[facility] Group Notes: (Clinical Social Work)   06/04/2016      Type of Therapy:  Group Therapy   Participation Level:  Did Not Attend despite MHT prompting   Candus Braud Grossman-Orr, LCSW 06/04/2016, 12:13 PM     

## 2016-06-04 NOTE — Tx Team (Signed)
Initial Treatment Plan 06/04/2016 12:37 AM Becky [name] WUJ:[id number]    PATIENT STRESSORS: Financial difficulties Marital or family conflict Substance abuse Other: Psychosis   PATIENT STRENGTHS: Ability for insight Communication skills Motivation for treatment/growth Physical Health   PATIENT IDENTIFIED PROBLEMS: Substance Abuse  Psychosis  Depression  "Get clean"  "Get medications figured out"             DISCHARGE CRITERIA:  Ability to meet basic life and health needs Adequate post-discharge living arrangements Improved stabilization in mood, thinking, and/or behavior Motivation to continue treatment in a less acute level of care Need for constant or close observation no longer present Withdrawal symptoms are absent or subacute and managed without 24-hour nursing intervention  PRELIMINARY DISCHARGE PLAN: Attend 12-step recovery group Outpatient therapy Return to previous living arrangement  PATIENT/FAMILY INVOLVEMENT: This treatment plan has been presented to and reviewed with the patient, Becky [name].  The patient and family have been given the opportunity to ask questions and make suggestions.  Carleene OverlieMiddleton, Aleem Elza P, RN 06/04/2016, 12:37 AM
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
## 2016-06-05 LAB — TSH: TSH: 1.032 u[IU]/mL (ref 0.350–4.500)

## 2016-06-05 MED ORDER — QUETIAPINE FUMARATE 300 MG PO TABS
300.0000 mg | ORAL_TABLET | ORAL | Status: DC
  Administered 2016-06-05 – 2016-06-06 (×3): 300 mg via ORAL
  Filled 2016-06-05 (×10): qty 1

## 2016-06-05 MED ORDER — TRAZODONE HCL 100 MG PO TABS
100.0000 mg | ORAL_TABLET | Freq: Every evening | ORAL | Status: DC | PRN
  Administered 2016-06-05 – 2016-06-06 (×2): 100 mg via ORAL
  Filled 2016-06-05 (×2): qty 1

## 2016-06-05 MED ORDER — NICOTINE 21 MG/24HR TD PT24
21.0000 mg | MEDICATED_PATCH | Freq: Every day | TRANSDERMAL | Status: DC
  Filled 2016-06-05 (×3): qty 1

## 2016-06-05 MED ORDER — GABAPENTIN 300 MG PO CAPS
600.0000 mg | ORAL_CAPSULE | ORAL | Status: DC
  Administered 2016-06-05 – 2016-06-08 (×9): 600 mg via ORAL
  Filled 2016-06-05 (×14): qty 2

## 2016-06-05 MED ORDER — IBUPROFEN 400 MG PO TABS
400.0000 mg | ORAL_TABLET | Freq: Four times a day (QID) | ORAL | Status: DC | PRN
  Administered 2016-06-05 – 2016-06-08 (×7): 400 mg via ORAL
  Filled 2016-06-05 (×8): qty 1

## 2016-06-05 NOTE — [facility] Group Notes (Signed)
Progressive Relaxation  Date:  06/05/2016  Time:  0930  Type of Therapy:  Nurse Education  Progressive Relaxation :  The group is focused on teaching patients ow to perform Progressive Relaxation and that it is a healthy coping skill to practice.  Participation Level:  Active  Participation Quality:  Attentive  Affect:  Blunted  Cognitive:  Disorganized  Insight:  Lacking  Engagement in Group:  Distracting  Modes of Intervention:  Education  Summary of Progress/Problems:  Rich BraveDuke, Sundae Maners [place] 06/05/2016, 11:52 AM

## 2016-06-05 NOTE — Progress Notes (Signed)
Patient ID: Ambrose PancoastDanielle Nooney, female   DOB: 08/01/1990, 26 y.o.   MRN: [id number]  Pt currently presents with a blunted affect and anxious behavior. Pt reports to writer that their goal is to "start doing better like everyone thinks I am." Pt impulse control improving. Brighter affect. Positive interaction with peers. Pt reports she is hopeful for good nights sleep tonight with changes to her current medication regimen. Pt reports being on her menses for the first time in 6 months. Pt experiencing dysmenorrhea post partum. Reports increased cramping, bleeding and clotting.    Pt provided with medications per providers orders. Pt's labs and vitals were monitored throughout the night. Pt given a 1:1 on emotional and mental status. Pt supported and encouraged to express concerns and questions. Pt educated on medications and pain management.   Pt's safety ensured with 15 minute and environmental checks. Pt currently denies SI/HI and A/V hallucinations. Pt verbally agrees to seek staff if SI/HI or A/VH occurs and to consult with staff before acting on any harmful thoughts. Pt now denies any signs and symptoms of withdrawal. Will continue POC.
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
## 2016-06-05 NOTE — [facility] Group Notes (Signed)
[facility] Group Notes: (Clinical Social Work)   06/05/2016      Type of Therapy:  Group Therapy   Participation Level:  Did Not Attend despite MHT prompting   Ambrose MantleMareida Grossman-Orr, LCSW 06/05/2016, 12:33 PM

## 2016-06-05 NOTE — Progress Notes (Signed)
Patient did not attend AA  Group meeting.

## 2016-06-05 NOTE — Progress Notes (Signed)
Patient ID: Becky PancoastDanielle [name], female   DOB: 09/22/1990, 26 y.o.   MRN: [id number]   Pt currently presents with a labile affect and impulsive, anxious behavior. Pt reports to writer that their goal is to "figure out these medications." Pt has numerous somatic complaints. Requests medications frequently. Easily agitated when requests are denied. Pt reports poor sleep with current medication regimen. Pt has concerns about the changes in her medication regimen. Pt exhibiting signs and symptoms of withdrawal including agitation and nausea.  Pt provided with medications per providers orders. Pt's labs and vitals were monitored throughout the night. Pt given a 1:1 about mental status. Pt supported emotionally and encouraged to express concerns and questions. Pt educated on medications and assertiveness techniques.   Pt's safety ensured with 15 minute and environmental checks. Pt currently denies SI/HI and A/V hallucinations. Pt verbally agrees to seek staff if SI/HI or A/VH occurs and to consult with staff before acting on any harmful thoughts. Pt has more energy today. Will continue POC.

## 2016-06-05 NOTE — Progress Notes (Signed)
D) Pt attends the groups and will go in and out of them. Still having trouble with concentration, but is trying hard and is working with the staff and trusting the staff with her medications. Today Pt is clearer. Was able to sit and do a puzzle with one of her peers. Needs a lot of reassurance and praise. Worked out a schedule that she follows at home for her medications that Pt is very pleased with. Rates her depression at a 6, hopelessness at a 6 and her anxiety at a 9. A) Giving Pt. A lot of reassurance and praise. Frequent contact with Pt throughout the day. Given support and positive feedback frequently. R) Pt. Denies SI and HI. Doing much better. Continues focused on her meds, but less so this shift than yesterday.

## 2016-06-05 NOTE — Progress Notes (Signed)
[facility]  06/05/2016 8:59 AM Becky [name]  MRN:  [id number]   Subjective:  Patient reports " I don't know how to get it through y'all heads these medications are not working for me at the low doses." Patient reports she is unable to sleep at night. States she was taken 200 mg-300mg  of trazodone.  Objective: Becky PancoastDanielle [name] is awake, alert and oriented *3 Seen pacing the unit.  Denies suicidal or homicidal ideation. Denies auditory or visual hallucination and does not appear to be responding to internal stimuli. Patient reports feeling better than yesterday. States she is still unable to sleep, throughout the night.  Patient reports he is medication compliant without mediation side effects. States her depression 7/10. Reports good appetite. Support, encouragement and reassurance was provided.    Principal Problem: MDD (major depressive disorder), recurrent severe, without psychosis (HCC) Diagnosis:   Patient Active Problem List   Diagnosis Date Noted  . MDD (major depressive disorder), recurrent severe, without psychosis (HCC) [F33.2] 06/03/2016  . Bipolar I disorder, most recent episode depressed (HCC) [F31.30] 07/04/2015  . Overdose [T50.901A] 07/02/2015  . Lactic acidosis [E87.2] 07/02/2015  . Acute respiratory failure (HCC) [J96.00] 07/02/2015  . Sepsis (HCC) [A41.9] 07/02/2015  . AKI (acute kidney injury) (HCC) [N17.9]   . Altered mental status [R41.82]   . Pyrexia [R50.9]   . Abdominal pain, epigastric [R10.13] 06/17/2015  . Hepatitis C antibody test positive [R76.8] 06/17/2015  . Peptic ulcer disease [K27.9] 06/17/2015  . Epigastric pain [R10.13]   . Transaminitis [R74.0] 06/16/2015  . History of substance abuse [Z87.898] 06/16/2015  . Bipolar affective disorder, current episode manic with psychotic symptoms (HCC) [F31.2] 03/03/2014   Total Time spent with patient: 30 minutes  Past Psychiatric History:   Past Medical History:  Past Medical History:   Diagnosis Date  . Bipolar 1 disorder (HCC)   . Bronchitis   . GAD (generalized anxiety disorder)   . Hepatitis C antibody test positive 06/17/2015  . History of substance abuse 06/16/2015  . OCD (obsessive compulsive disorder)   . Peptic ulcer   . Polysubstance abuse   . PTSD (post-traumatic stress disorder)     Past Surgical History:  Procedure Laterality Date  . OTHER SURGICAL HISTORY     scar tissue removed from right ovary   . OTHER SURGICAL HISTORY     fallopian tube repair  . WISDOM TOOTH EXTRACTION     Family History:  Family History  Problem Relation Age of Onset  . Drug abuse Brother   . Cancer Other    Family Psychiatric  History:  Social History:  History  Alcohol Use  . Yes    Comment: occ     History  Drug Use  . Types: Marijuana, Methamphetamines, MDMA (Ecstacy), Oxycodone, Benzodiazepines, Cocaine    Comment: 14 months ago heroin- pt states "anything i can get my hands on."  Last used percocet 2 days ago.     Social History   Social History  . Marital status: Legally Separated    Spouse name: N/A  . Number of children: N/A  . Years of education: N/A   Social History Main Topics  . Smoking status: Current Every Day Smoker    Packs/day: 1.00    Years: 10.00    Types: Cigarettes  . Smokeless tobacco: Never Used  . Alcohol use Yes     Comment: occ  . Drug use: Yes    Types: Marijuana, Methamphetamines, MDMA (Ecstacy), Oxycodone, Benzodiazepines, Cocaine  Comment: 14 months ago heroin- pt states "anything i can get my hands on."  Last used percocet 2 days ago.   Marland Kitchen Sexual activity: Yes    Birth control/ protection: None   Other Topics Concern  . None   Social History Narrative  . None   Additional Social History:                         Sleep: Poor  Appetite:  Fair  Current Medications: Current Facility-Administered Medications  Medication Dose Route Frequency Provider Last Rate Last Dose  . acetaminophen (TYLENOL) tablet  650 mg  650 mg Oral Q6H PRN Laveda Abbe, NP   650 mg at 06/04/16 1309  . alum & mag hydroxide-simeth (MAALOX/MYLANTA) 200-200-20 MG/5ML suspension 30 mL  30 mL Oral Q4H PRN Laveda Abbe, NP   30 mL at 06/04/16 1536  . benztropine (COGENTIN) tablet 1 mg  1 mg Oral QHS Laveda Abbe, NP   1 mg at 06/04/16 2103  . citalopram (CELEXA) tablet 10 mg  10 mg Oral Daily Laveda Abbe, NP   10 mg at 06/05/16 0753  . dicyclomine (BENTYL) capsule 20 mg  20 mg Oral TID AC Laveda Abbe, NP   20 mg at 06/05/16 1610  . doxycycline (VIBRA-TABS) tablet 100 mg  100 mg Oral Q12H Laveda Abbe, NP   100 mg at 06/05/16 0753  . gabapentin (NEURONTIN) capsule 600 mg  600 mg Oral TID Laveda Abbe, NP   600 mg at 06/05/16 0756  . hydrOXYzine (ATARAX/VISTARIL) tablet 25 mg  25 mg Oral Q6H PRN Laveda Abbe, NP   25 mg at 06/04/16 2103  . levothyroxine (SYNTHROID, LEVOTHROID) tablet 50 mcg  50 mcg Oral q morning - 10a Laveda Abbe, NP   50 mcg at 06/04/16 0800  . magnesium hydroxide (MILK OF MAGNESIA) suspension 30 mL  30 mL Oral Daily PRN Laveda Abbe, NP   30 mL at 06/05/16 9604  . metroNIDAZOLE (FLAGYL) tablet 500 mg  500 mg Oral Q12H Laveda Abbe, NP   500 mg at 06/05/16 0753  . mirtazapine (REMERON) tablet 15 mg  15 mg Oral QHS Jackelyn Poling, NP   15 mg at 06/04/16 2103  . nicotine polacrilex (NICORETTE) gum 2 mg  2 mg Oral PRN Jomarie Longs, MD   2 mg at 06/05/16 0753  . ondansetron (ZOFRAN-ODT) disintegrating tablet 8 mg  8 mg Oral Q8H PRN Jackelyn Poling, NP   8 mg at 06/03/16 2119  . pantoprazole (PROTONIX) EC tablet 20 mg  20 mg Oral Daily Neysa Hotter, MD   20 mg at 06/05/16 0752  . polyethylene glycol (MIRALAX / GLYCOLAX) packet 17 g  17 g Oral Daily PRN Laveda Abbe, NP      . QUEtiapine (SEROQUEL) tablet 300 mg  300 mg Oral TID Laveda Abbe, NP   300 mg at 06/05/16 0753  . QUEtiapine (SEROQUEL) tablet 50 mg  50 mg  Oral TID PRN Neysa Hotter, MD   50 mg at 06/04/16 2103  . traZODone (DESYREL) tablet 50 mg  50 mg Oral QHS PRN Neysa Hotter, MD   50 mg at 06/04/16 2103    Lab Results:  Results for orders placed or performed during the hospital encounter of 06/03/16 (from the past 48 hour(s))  TSH     Status: None   Collection Time: 06/05/16  6:10 AM  Result Value Ref Range   TSH 1.032 0.350 - 4.500 uIU/mL    Comment: Performed by a 3rd Generation assay with a functional sensitivity of <=0.01 uIU/mL. Performed at [facility], [address], [place], [place] [address]     Blood Alcohol level:  Lab Results  Component Value Date   [facility] <5 05/31/2016   ETH <5 05/19/2016    Metabolic Disorder Labs: Lab Results  Component Value Date   HGBA1C 5.7 (H) 03/05/2014   MPG 117 (H) 03/05/2014   No results found for: PROLACTIN Lab Results  Component Value Date   CHOL 213 (H) 03/05/2014   TRIG 190 (H) 03/05/2014   HDL 45 03/05/2014   CHOLHDL 4.7 03/05/2014   VLDL 38 03/05/2014   LDLCALC 130 (H) 03/05/2014    Physical Findings: AIMS: Facial and Oral Movements Muscles of Facial Expression: None, normal Lips and Perioral Area: None, normal Jaw: None, normal Tongue: None, normal,Extremity Movements Upper (arms, wrists, hands, fingers): None, normal Lower (legs, knees, ankles, toes): None, normal, Trunk Movements Neck, shoulders, hips: None, normal, Overall Severity Severity of abnormal movements (highest score from questions above): None, normal Incapacitation due to abnormal movements: None, normal Patient's awareness of abnormal movements (rate only patient's report): No Awareness, Dental Status Current problems with teeth and/or dentures?: No Does patient usually wear dentures?: No  CIWA:    COWS:     Musculoskeletal: Strength & Muscle Tone: within normal limits Gait & Station: normal Patient leans: N/A  Psychiatric Specialty Exam: Physical Exam  Vitals  reviewed. Cardiovascular: Normal rate.   Neurological: She is alert.  Skin: Skin is warm.  Psychiatric: She has a normal mood and affect. Her behavior is normal.    Review of Systems  Psychiatric/Behavioral: Positive for depression. The patient is nervous/anxious.     Blood pressure 109/69, pulse 95, temperature 97.9 F (36.6 C), temperature source Oral, resp. rate 18, last menstrual period 05/15/2016.There is no height or weight on file to calculate BMI.  General Appearance: Casual  Eye Contact:  Fair  Speech:  Clear and Coherent  Volume:  Normal  Mood:  Anxious and Depressed  Affect:  Appropriate and Congruent  Thought Process:  Linear  Orientation:  Full (Time, Place, and Person)  Thought Content:  Hallucinations: None  Suicidal Thoughts:  No  Homicidal Thoughts:  No  Memory:  Immediate;   Poor Recent;   Fair Remote;   Fair  Judgement:  Fair  Insight:  Present  Psychomotor Activity:  Restlessness  Concentration:  Concentration: Fair  Recall:  [employer] of Knowledge:  Fair  Language:  Good  Akathisia:  No  Handed:  Right  AIMS (if indicated):     Assets:  Communication Skills Desire for Improvement Resilience Social Support  ADL's:  Intact  Cognition:  WNL  Sleep:  Number of Hours: 5.5    I agree with current treatment plan on 06/05/2016, Patient seen face-to-face for psychiatric evaluation follow-up, chart reviewed. Reviewed the information documented and agree with the treatment plan.  Treatment Plan Summary: Daily contact with patient to assess and evaluate symptoms and progress in treatment and Medication management   EKG reviewed- Normal sinus. (WNL- baseline) Continue with Celexa 10 mg, Seroquel 300 mg QHS and 50mg  PRN TID, Neurontin 600 mg  for mood stabilization. Continue with Remeron 15 mg and Increased trazodone from 50 mg to 100 mg  for insomnia Started on CWIA/ Librium Protocol Will continue to monitor vitals ,medication compliance and treatment  side  effects while patient is here.  Reviewed labs BAL -, UDS - pos for cocaine, thc and benzodizpines.  CSW will start working on disposition.  Patient to participate in therapeutic milieu  Oneta Rack, NP 06/05/2016, 8:59 AM

## 2016-06-06 ENCOUNTER — Encounter (HOSPITAL_COMMUNITY): Payer: Self-pay | Admitting: Psychiatry

## 2016-06-06 DIAGNOSIS — F1320 Sedative, hypnotic or anxiolytic dependence, uncomplicated: Secondary | ICD-10-CM

## 2016-06-06 DIAGNOSIS — F332 Major depressive disorder, recurrent severe without psychotic features: Principal | ICD-10-CM

## 2016-06-06 DIAGNOSIS — F1420 Cocaine dependence, uncomplicated: Secondary | ICD-10-CM | POA: Diagnosis present

## 2016-06-06 DIAGNOSIS — Z79899 Other long term (current) drug therapy: Secondary | ICD-10-CM

## 2016-06-06 DIAGNOSIS — F17210 Nicotine dependence, cigarettes, uncomplicated: Secondary | ICD-10-CM

## 2016-06-06 DIAGNOSIS — Z813 Family history of other psychoactive substance abuse and dependence: Secondary | ICD-10-CM

## 2016-06-06 DIAGNOSIS — Z811 Family history of alcohol abuse and dependence: Secondary | ICD-10-CM

## 2016-06-06 DIAGNOSIS — F1220 Cannabis dependence, uncomplicated: Secondary | ICD-10-CM | POA: Diagnosis present

## 2016-06-06 DIAGNOSIS — Z9889 Other specified postprocedural states: Secondary | ICD-10-CM

## 2016-06-06 DIAGNOSIS — Z808 Family history of malignant neoplasm of other organs or systems: Secondary | ICD-10-CM

## 2016-06-06 DIAGNOSIS — Z818 Family history of other mental and behavioral disorders: Secondary | ICD-10-CM

## 2016-06-06 DIAGNOSIS — F1120 Opioid dependence, uncomplicated: Secondary | ICD-10-CM

## 2016-06-06 MED ORDER — BENZOCAINE 10 % MT GEL
OROMUCOSAL | Status: AC
  Filled 2016-06-06: qty 9.4

## 2016-06-06 MED ORDER — QUETIAPINE FUMARATE 25 MG PO TABS
25.0000 mg | ORAL_TABLET | Freq: Three times a day (TID) | ORAL | Status: DC | PRN
  Administered 2016-06-06 – 2016-06-08 (×7): 25 mg via ORAL
  Filled 2016-06-06 (×8): qty 1

## 2016-06-06 MED ORDER — BENZOCAINE 10 % MT GEL
Freq: Four times a day (QID) | OROMUCOSAL | Status: DC | PRN
  Administered 2016-06-06: 21:00:00 via OROMUCOSAL

## 2016-06-06 MED ORDER — QUETIAPINE FUMARATE 300 MG PO TABS
300.0000 mg | ORAL_TABLET | ORAL | Status: DC
  Administered 2016-06-07 (×2): 300 mg via ORAL
  Filled 2016-06-06 (×6): qty 1

## 2016-06-06 MED ORDER — MIRTAZAPINE 30 MG PO TABS
30.0000 mg | ORAL_TABLET | Freq: Every day | ORAL | Status: DC
  Administered 2016-06-06 – 2016-06-07 (×2): 30 mg via ORAL
  Filled 2016-06-06 (×3): qty 1

## 2016-06-06 MED ORDER — HYDROXYZINE HCL 50 MG PO TABS
50.0000 mg | ORAL_TABLET | Freq: Four times a day (QID) | ORAL | Status: DC | PRN
  Administered 2016-06-06 – 2016-06-08 (×7): 50 mg via ORAL
  Filled 2016-06-06: qty 20
  Filled 2016-06-06 (×7): qty 1

## 2016-06-06 MED ORDER — LORAZEPAM 1 MG PO TABS
1.0000 mg | ORAL_TABLET | ORAL | Status: DC | PRN
  Administered 2016-06-06 – 2016-06-08 (×10): 1 mg via ORAL
  Filled 2016-06-06 (×10): qty 1

## 2016-06-06 MED ORDER — QUETIAPINE FUMARATE 400 MG PO TABS
400.0000 mg | ORAL_TABLET | Freq: Every day | ORAL | Status: DC
  Administered 2016-06-06: 400 mg via ORAL
  Filled 2016-06-06 (×3): qty 1

## 2016-06-06 NOTE — Progress Notes (Signed)
Nursing Progress Note: 7p-7a D: Pt currently presents with a flat/sad/pleasant affect and behavior. Pt states "i'm feeling low. I miss my son. He is only 2618 months old. It's just hard to focus because. But other than that Im fine. I heard whispers about me in the shower. They were talking sh*t but I realize that's all in my head." Interacting appropriately with milieu. Pt reports off and on sleep with current medication regimen.   A: Pt provided with medications per providers orders. Pt's labs and vitals were monitored throughout the night. Pt supported emotionally and encouraged to express concerns and questions. Pt educated on medications.  R: Pt's safety ensured with 15 minute and environmental checks. Pt currently denies SI/HI/Self Harm and visual hallucinations and endorses auditory hallucinations. Pt verbally contracts to seek staff if SI/HI or A/VH occurs and to consult with staff before acting on any harmful thoughts. Will continue to monitor.

## 2016-06-06 NOTE — Tx Team (Signed)
Interdisciplinary Treatment and Diagnostic Plan Update  06/06/2016 Time of Session: 9:30AM Becky PancoastDanielle [name] MRN: [id number]  Principal Diagnosis: MDD (major depressive disorder), recurrent severe, without psychosis (HCC)  Secondary Diagnoses: Principal Problem:   MDD (major depressive disorder), recurrent severe, without psychosis (HCC)   Current Medications:  Current Facility-Administered Medications  Medication Dose Route Frequency Provider Last Rate Last Dose  . acetaminophen (TYLENOL) tablet 650 mg  650 mg Oral Q6H PRN Laveda AbbeLaurie Britton Parks, NP   650 mg at 06/06/16 0636  . alum & mag hydroxide-simeth (MAALOX/MYLANTA) 200-200-20 MG/5ML suspension 30 mL  30 mL Oral Q4H PRN Laveda AbbeLaurie Britton Parks, NP   30 mL at 06/04/16 1536  . benztropine (COGENTIN) tablet 1 mg  1 mg Oral QHS Laveda AbbeLaurie Britton Parks, NP   1 mg at 06/05/16 2104  . citalopram (CELEXA) tablet 10 mg  10 mg Oral Daily Laveda AbbeLaurie Britton Parks, NP   10 mg at 06/06/16 0745  . dicyclomine (BENTYL) capsule 20 mg  20 mg Oral TID AC Laveda AbbeLaurie Britton Parks, NP   20 mg at 06/06/16 [id number]  . doxycycline (VIBRA-TABS) tablet 100 mg  100 mg Oral Q12H Laveda AbbeLaurie Britton Parks, NP   100 mg at 06/06/16 0745  . gabapentin (NEURONTIN) capsule 600 mg  600 mg Oral 3 times per day Craige CottaFernando A Cobos, MD   600 mg at 06/06/16 0745  . hydrOXYzine (ATARAX/VISTARIL) tablet 25 mg  25 mg Oral Q6H PRN Laveda AbbeLaurie Britton Parks, NP   25 mg at 06/04/16 2103  . ibuprofen (ADVIL,MOTRIN) tablet 400 mg  400 mg Oral Q6H PRN Jackelyn PolingJason A Berry, NP   400 mg at 06/06/16 0408  . levothyroxine (SYNTHROID, LEVOTHROID) tablet 50 mcg  50 mcg Oral q morning - 10a Laveda AbbeLaurie Britton Parks, NP   50 mcg at 06/05/16 1153  . magnesium hydroxide (MILK OF MAGNESIA) suspension 30 mL  30 mL Oral Daily PRN Laveda AbbeLaurie Britton Parks, NP   30 mL at 06/05/16 [id number]  . metroNIDAZOLE (FLAGYL) tablet 500 mg  500 mg Oral Q12H Laveda AbbeLaurie Britton Parks, NP   500 mg at 06/06/16 0744  . mirtazapine (REMERON) tablet 15 mg  15 mg Oral  QHS Jackelyn PolingJason A Berry, NP   15 mg at 06/05/16 2104  . nicotine polacrilex (NICORETTE) gum 2 mg  2 mg Oral PRN Jomarie LongsSaramma Eappen, MD   2 mg at 06/06/16 0726  . ondansetron (ZOFRAN-ODT) disintegrating tablet 8 mg  8 mg Oral Q8H PRN Jackelyn PolingJason A Berry, NP   8 mg at 06/03/16 2119  . pantoprazole (PROTONIX) EC tablet 20 mg  20 mg Oral Daily Neysa Hottereina Hisada, MD   20 mg at 06/06/16 0745  . polyethylene glycol (MIRALAX / GLYCOLAX) packet 17 g  17 g Oral Daily PRN Laveda AbbeLaurie Britton Parks, NP      . QUEtiapine (SEROQUEL) tablet 300 mg  300 mg Oral 3 times per day Craige CottaFernando A Cobos, MD   300 mg at 06/06/16 0745  . QUEtiapine (SEROQUEL) tablet 50 mg  50 mg Oral TID PRN Neysa Hottereina Hisada, MD   50 mg at 06/05/16 1729  . traZODone (DESYREL) tablet 100 mg  100 mg Oral QHS PRN Oneta Rackanika N Lewis, NP   100 mg at 06/05/16 2104   PTA Medications: Prescriptions Prior to Admission  Medication Sig Dispense Refill Last Dose  . busPIRone (BUSPAR) 30 MG tablet Take 30 mg by mouth 3 (three) times daily.   05/31/2016 at Unknown time  . citalopram (CELEXA) 40 MG tablet Take 40 mg by mouth  daily.   05/31/2016 at Unknown time  . gabapentin (NEURONTIN) 600 MG tablet Take 600 mg by mouth 3 (three) times daily.   05/31/2016 at Unknown time  . levothyroxine (SYNTHROID, LEVOTHROID) 50 MCG tablet Take 50 mcg by mouth every morning.   05/31/2016 at Unknown time  . mirtazapine (REMERON) 15 MG tablet Take 15 mg by mouth at bedtime.   05/30/2016 at Unknown time  . OLANZapine zydis (ZYPREXA) 20 MG disintegrating tablet Take 20 mg by mouth 2 (two) times daily.   05/31/2016 at Unknown time  . perphenazine (TRILAFON) 4 MG tablet Take 8 mg by mouth 3 (three) times daily.   05/31/2016 at Unknown time  . QUEtiapine (SEROQUEL) 300 MG tablet Take 300 mg by mouth 3 (three) times daily.   05/31/2016 at Unknown time  . traZODone (DESYREL) 150 MG tablet Take 300 mg by mouth at bedtime.    05/30/2016 at Unknown time    Patient Stressors: Financial difficulties Marital or family  conflict Substance abuse Other: Psychosis  Patient Strengths: Ability for insight Barrister's clerk for treatment/growth Physical Health  Treatment Modalities: Medication Management, Group therapy, Case management,  1 to 1 session with clinician, Psychoeducation, Recreational therapy.   Physician Treatment Plan for Primary Diagnosis: MDD (major depressive disorder), recurrent severe, without psychosis (HCC) Long Term Goal(s): Improvement in symptoms so as ready for discharge Improvement in symptoms so as ready for discharge   Short Term Goals: Ability to identify changes in lifestyle to reduce recurrence of condition will improve Ability to verbalize feelings will improve Ability to identify and develop effective coping behaviors will improve Compliance with prescribed medications will improve Ability to identify triggers associated with substance abuse/mental health issues will improve Ability to verbalize feelings will improve Ability to identify and develop effective coping behaviors will improve Ability to maintain clinical measurements within normal limits will improve  Medication Management: Evaluate patient's response, side effects, and tolerance of medication regimen.  Therapeutic Interventions: 1 to 1 sessions, Unit Group sessions and Medication administration.  Evaluation of Outcomes: Progressing  Physician Treatment Plan for Secondary Diagnosis: Principal Problem:   MDD (major depressive disorder), recurrent severe, without psychosis (HCC)  Long Term Goal(s): Improvement in symptoms so as ready for discharge Improvement in symptoms so as ready for discharge   Short Term Goals: Ability to identify changes in lifestyle to reduce recurrence of condition will improve Ability to verbalize feelings will improve Ability to identify and develop effective coping behaviors will improve Compliance with prescribed medications will improve Ability to identify  triggers associated with substance abuse/mental health issues will improve Ability to verbalize feelings will improve Ability to identify and develop effective coping behaviors will improve Ability to maintain clinical measurements within normal limits will improve     Medication Management: Evaluate patient's response, side effects, and tolerance of medication regimen.  Therapeutic Interventions: 1 to 1 sessions, Unit Group sessions and Medication administration.  Evaluation of Outcomes: Progressing   RN Treatment Plan for Primary Diagnosis: MDD (major depressive disorder), recurrent severe, without psychosis (HCC) Long Term Goal(s): Knowledge of disease and therapeutic regimen to maintain health will improve  Short Term Goals: Ability to remain free from injury will improve, Ability to participate in decision making will improve and Ability to verbalize feelings will improve  Medication Management: RN will administer medications as ordered by provider, will assess and evaluate patient's response and provide education to patient for prescribed medication. RN will report any adverse and/or side effects to prescribing provider.  Therapeutic Interventions: 1 on 1 counseling sessions, Psychoeducation, Medication administration, Evaluate responses to treatment, Monitor vital signs and CBGs as ordered, Perform/monitor CIWA, COWS, AIMS and Fall Risk screenings as ordered, Perform wound care treatments as ordered.  Evaluation of Outcomes: Progressing   LCSW Treatment Plan for Primary Diagnosis: MDD (major depressive disorder), recurrent severe, without psychosis (HCC) Long Term Goal(s): Safe transition to appropriate next level of care at discharge, Engage patient in therapeutic group addressing interpersonal concerns.  Short Term Goals: Engage patient in aftercare planning with referrals and resources, Facilitate patient progression through stages of change regarding substance use diagnoses and  concerns and Identify triggers associated with mental health/substance abuse issues  Therapeutic Interventions: Assess for all discharge needs, 1 to 1 time with Social worker, Explore available resources and support systems, Assess for adequacy in community support network, Educate family and significant other(s) on suicide prevention, Complete Psychosocial Assessment, Interpersonal group therapy.  Evaluation of Outcomes: Progressing   Progress in Treatment: Attending groups: Yes. Participating in groups: Yes. Taking medication as prescribed: Yes. Toleration medication: Yes. Family/Significant other contact made: No, will contact:  family member if patient consents. Patient understands diagnosis: Yes. Discussing patient identified problems/goals with staff: Yes. Medical problems stabilized or resolved: Yes. Denies suicidal/homicidal ideation: Yes. Issues/concerns per patient self-inventory: No. Other: n/a  New problem(s) identified: No, Describe:  n/a.  New Short Term/Long Term Goal(s):  Discharge Plan or Barriers:  CSW assessing for appropriate referrals. She is hoping to get into [facility] at discharge. Possibly [facility] as backup. Pt lives in [place].   Reason for Continuation of Hospitalization: Depression Medication stabilization Withdrawal symptoms  Estimated Length of Stay: 3-5 days   Attendees: Patient: 06/06/2016 8:42 AM  Physician: Dr. Elna Breslow MD 06/06/2016 8:42 AM  Nursing: Doy Mince RN 06/06/2016 8:42 AM  RN Care Manager: Onnie Boer CM 06/06/2016 8:42 AM  Social Worker: Herbert Seta Smart, LCSW; Vernie Shanks LCSW 06/06/2016 8:42 AM  Recreational Therapist:  06/06/2016 8:42 AM  Other: Hillery Jacks NP; Armandina Stammer NP 06/06/2016 8:42 AM  Other:  06/06/2016 8:42 AM  Other: 06/06/2016 8:42 AM    Scribe for Treatment Team: Ledell Peoples Smart, LCSW 06/06/2016 8:42 AM

## 2016-06-06 NOTE — Progress Notes (Signed)
Recreation Therapy Notes  Date: 06/06/16 Time: 0930 Location: [address] Group Room  Group Topic: Stress Management  Goal Area(s) Addresses:  Patient will verbalize importance of using healthy stress management.  Patient will identify positive emotions associated with healthy stress management.   Behavioral Response: Engaged  Intervention: Guided Imagery  Activity :  Peaceful Place.  LRT introduced the stress management technique of guided imagery.  LRT read a script that allowed patients to take a mental journey to their favorite place.  Patients were to listen and follow along as the LRT read the script.  Education:  Stress Management, Discharge Planning.   Education Outcome: Acknowledges edcuation/In group clarification offered/Needs additional education  Clinical Observations/Feedback: Pt attended group.   Catherine Cubero, LRT/CTRS         Johnae Friley A 06/06/2016 11:49 AM 

## 2016-06-06 NOTE — Progress Notes (Signed)
D:  Patient awake and alert; oriented x 4; she denies suicidal and homicidal ideation but has reportedly endorsed auditory hallucinations; no self-injurious behaviors noted or reported. Affect flat; mood anxious/labile A:  Medications given as scheduled; PRN Quietapine given for c/o anxiety (see MAR) Emotional support provided; encouraged her to seek assistance with needs/concerns. R:  Safety maintained on unit.

## 2016-06-06 NOTE — [facility] Group Notes (Signed)
[facility] LCSW Group Therapy  06/06/2016 3:47 PM  Type of Therapy:  Group Therapy  Participation Level:  Active  Participation Quality:  Attentive  Affect:  Appropriate  Cognitive:  Alert and Oriented  Insight:  Improving  Engagement in Therapy:  Improving  Modes of Intervention:  Confrontation, Discussion, Education, Problem-solving, Socialization and Support  Summary of Progress/Problems: Today's Topic: Overcoming Obstacles. Patients identified one short term goal and potential obstacles in reaching this goal. Patients processed barriers involved in overcoming these obstacles. Patients identified steps necessary for overcoming these obstacles and explored motivation (internal and external) for facing these difficulties head on.   Ledell PeoplesHeather N Smart LCSW 06/06/2016, 3:47 PM

## 2016-06-06 NOTE — Progress Notes (Signed)
CSW met with pt individually to discuss aftercare--pt has phone interview with [facility]) on Wed, 06/09/15 at 1:45PM. She requested [facility] referral be made "just in case." Referral faxed 06/06/2016 1:06 PM   Maxie Better, MSW, LCSW Clinical Social Worker 06/06/2016 1:06 PM

## 2016-06-06 NOTE — Plan of Care (Signed)
Problem: Safety: Goal: Ability to remain free from injury will improve Outcome: Progressing Safety maintained on unit  Problem: Health Behavior/Discharge Planning: Goal: Compliance with therapeutic regimen will improve Outcome: Progressing Compliant with therapeutic regimen  Problem: Safety: Goal: Ability to disclose and discuss suicidal ideas will improve Outcome: Progressing Denies suicidal ideation

## 2016-06-06 NOTE — Progress Notes (Signed)
[facility] MD Progress Note  06/06/2016 1:46 PM Tyaisha Cullom  MRN:  [id number]   Subjective:  Patient reports " I  Still feel anxious , something is not working. They changed my medications around. I am jittery. I cannot speak without shaking. Its my GAD acting up. I want my seroquel as it is . It helps me.'     Objective: Jaisa Defino is a 65 y old CF , who has a hx of MDD, polysubstance abuse who presented with psychosis and worsening anxiety. Patient seen and chart reviewed.Discussed patient with treatment team.  Pt today is seen as alert , oriented - but anxious and jittery. Pt reports she last used xanax 4 days ago and she feels her withdrawal sx are worsening. Pt currently not on CIWA - since she had told the provider who saw her on admission that her last use of xanax was weeks ago. Pt seen as anxious , restless - focussed on adding more medications to help with her sx. Pt currently on polypharmacy - discussed making changes . Will readjust the dose of seroquel since she states she wants to stay on the same dose during the day , but is OK with dosage increase at night. Continue to offer support.     Principal Problem: MDD (major depressive disorder), recurrent severe, without psychosis (HCC) Diagnosis:   Patient Active Problem List   Diagnosis Date Noted  . Opioid use disorder, moderate, dependence (HCC) [F11.20] 06/06/2016  . Sedative, hypnotic or anxiolytic use disorder, severe, dependence (HCC) [F13.20] 06/06/2016  . Cocaine use disorder, severe, dependence (HCC) [F14.20] 06/06/2016  . Cannabis use disorder, severe, dependence (HCC) [F12.20] 06/06/2016  . MDD (major depressive disorder), recurrent severe, without psychosis (HCC) [F33.2] 06/03/2016  . Bipolar I disorder, most recent episode depressed (HCC) [F31.30] 07/04/2015  . Overdose [T50.901A] 07/02/2015  . Lactic acidosis [E87.2] 07/02/2015  . Acute respiratory failure (HCC) [J96.00] 07/02/2015  . Sepsis (HCC)  [A41.9] 07/02/2015  . AKI (acute kidney injury) (HCC) [N17.9]   . Altered mental status [R41.82]   . Pyrexia [R50.9]   . Abdominal pain, epigastric [R10.13] 06/17/2015  . Hepatitis C antibody test positive [R76.8] 06/17/2015  . Peptic ulcer disease [K27.9] 06/17/2015  . Epigastric pain [R10.13]   . Transaminitis [R74.0] 06/16/2015  . History of substance abuse [Z87.898] 06/16/2015  . Bipolar affective disorder, current episode manic with psychotic symptoms (HCC) [F31.2] 03/03/2014   Total Time spent with patient: 30 minutes  Past Psychiatric History: Please see H&P.   Past Medical History:  Past Medical History:  Diagnosis Date  . Bipolar 1 disorder (HCC)   . Bronchitis   . GAD (generalized anxiety disorder)   . Hepatitis C antibody test positive 06/17/2015  . History of substance abuse 06/16/2015  . OCD (obsessive compulsive disorder)   . Peptic ulcer   . Polysubstance abuse   . PTSD (post-traumatic stress disorder)     Past Surgical History:  Procedure Laterality Date  . OTHER SURGICAL HISTORY     scar tissue removed from right ovary   . OTHER SURGICAL HISTORY     fallopian tube repair  . WISDOM TOOTH EXTRACTION     Family History:  Family History  Problem Relation Age of Onset  . Drug abuse Brother   . Suicidality Brother   . Cancer Other   . Depression Mother   . Drug abuse Mother   . Alcoholism Father   . Bipolar disorder Cousin    Family Psychiatric  History: Please  see H&P.  Social History: Please see H&P.  History  Alcohol Use  . Yes    Comment: occ     History  Drug Use  . Types: Marijuana, Methamphetamines, MDMA (Ecstacy), Oxycodone, Benzodiazepines, Cocaine    Comment: 14 months ago heroin- pt states "anything i can get my hands on."  Last used percocet 2 days ago.     Social History   Social History  . Marital status: Legally Separated    Spouse name: N/A  . Number of children: N/A  . Years of education: N/A   Social History Main Topics   . Smoking status: Current Every Day Smoker    Packs/day: 1.00    Years: 10.00    Types: Cigarettes  . Smokeless tobacco: Never Used  . Alcohol use Yes     Comment: occ  . Drug use: Yes    Types: Marijuana, Methamphetamines, MDMA (Ecstacy), Oxycodone, Benzodiazepines, Cocaine     Comment: 14 months ago heroin- pt states "anything i can get my hands on."  Last used percocet 2 days ago.   Marland Kitchen Sexual activity: Yes    Birth control/ protection: None   Other Topics Concern  . None   Social History Narrative  . None   Additional Social History:                         Sleep: Poor  Appetite:  Fair  Current Medications: Current Facility-Administered Medications  Medication Dose Route Frequency Provider Last Rate Last Dose  . acetaminophen (TYLENOL) tablet 650 mg  650 mg Oral Q6H PRN Laveda Abbe, NP   650 mg at 06/06/16 0636  . alum & mag hydroxide-simeth (MAALOX/MYLANTA) 200-200-20 MG/5ML suspension 30 mL  30 mL Oral Q4H PRN Laveda Abbe, NP   30 mL at 06/04/16 1536  . benztropine (COGENTIN) tablet 1 mg  1 mg Oral QHS Laveda Abbe, NP   1 mg at 06/05/16 2104  . dicyclomine (BENTYL) capsule 20 mg  20 mg Oral TID AC Laveda Abbe, NP   20 mg at 06/06/16 1201  . doxycycline (VIBRA-TABS) tablet 100 mg  100 mg Oral Q12H Laveda Abbe, NP   100 mg at 06/06/16 0745  . gabapentin (NEURONTIN) capsule 600 mg  600 mg Oral 3 times per day Craige Cotta, MD   600 mg at 06/06/16 0745  . hydrOXYzine (ATARAX/VISTARIL) tablet 50 mg  50 mg Oral Q6H PRN Jomarie Longs, MD   50 mg at 06/06/16 1328  . ibuprofen (ADVIL,MOTRIN) tablet 400 mg  400 mg Oral Q6H PRN Jackelyn Poling, NP   400 mg at 06/06/16 0935  . levothyroxine (SYNTHROID, LEVOTHROID) tablet 50 mcg  50 mcg Oral q morning - 10a Laveda Abbe, NP   50 mcg at 06/06/16 0933  . LORazepam (ATIVAN) tablet 1 mg  1 mg Oral Q4H PRN Suraya Vidrine, MD      . magnesium hydroxide (MILK OF MAGNESIA)  suspension 30 mL  30 mL Oral Daily PRN Laveda Abbe, NP   30 mL at 06/05/16 0621  . metroNIDAZOLE (FLAGYL) tablet 500 mg  500 mg Oral Q12H Laveda Abbe, NP   500 mg at 06/06/16 0744  . mirtazapine (REMERON) tablet 30 mg  30 mg Oral QHS Briggette Najarian, MD      . nicotine polacrilex (NICORETTE) gum 2 mg  2 mg Oral PRN Jomarie Longs, MD   2 mg at 06/06/16  1254  . ondansetron (ZOFRAN-ODT) disintegrating tablet 8 mg  8 mg Oral Q8H PRN Jackelyn Poling, NP   8 mg at 06/03/16 2119  . pantoprazole (PROTONIX) EC tablet 20 mg  20 mg Oral Daily Neysa Hotter, MD   20 mg at 06/06/16 0745  . polyethylene glycol (MIRALAX / GLYCOLAX) packet 17 g  17 g Oral Daily PRN Laveda Abbe, NP      . QUEtiapine (SEROQUEL) tablet 25 mg  25 mg Oral TID PRN Jomarie Longs, MD      . Melene Muller ON 06/07/2016] QUEtiapine (SEROQUEL) tablet 300 mg  300 mg Oral [id number] Alsie Younes, MD      . QUEtiapine (SEROQUEL) tablet 400 mg  400 mg Oral QHS Hollyanne Schloesser, MD      . traZODone (DESYREL) tablet 100 mg  100 mg Oral QHS PRN Oneta Rack, NP   100 mg at 06/05/16 2104    Lab Results:  Results for orders placed or performed during the hospital encounter of 06/03/16 (from the past 48 hour(s))  TSH     Status: None   Collection Time: 06/05/16  6:10 AM  Result Value Ref Range   TSH 1.032 0.350 - 4.500 uIU/mL    Comment: Performed by a 3rd Generation assay with a functional sensitivity of <=0.01 uIU/mL. Performed at [facility], [address], [place], [place] [address]     Blood Alcohol level:  Lab Results  Component Value Date   [facility] <5 05/31/2016   ETH <5 05/19/2016    Metabolic Disorder Labs: Lab Results  Component Value Date   HGBA1C 5.7 (H) 03/05/2014   MPG 117 (H) 03/05/2014   No results found for: PROLACTIN Lab Results  Component Value Date   CHOL 213 (H) 03/05/2014   TRIG 190 (H) 03/05/2014   HDL 45 03/05/2014   CHOLHDL 4.7 03/05/2014   VLDL 38 03/05/2014    LDLCALC 130 (H) 03/05/2014    Physical Findings: AIMS: Facial and Oral Movements Muscles of Facial Expression: None, normal Lips and Perioral Area: None, normal Jaw: None, normal Tongue: None, normal,Extremity Movements Upper (arms, wrists, hands, fingers): None, normal Lower (legs, knees, ankles, toes): None, normal, Trunk Movements Neck, shoulders, hips: None, normal, Overall Severity Severity of abnormal movements (highest score from questions above): None, normal Incapacitation due to abnormal movements: None, normal Patient's awareness of abnormal movements (rate only patient's report): No Awareness, Dental Status Current problems with teeth and/or dentures?: No Does patient usually wear dentures?: No  CIWA:    COWS:     Musculoskeletal: Strength & Muscle Tone: within normal limits Gait & Station: normal Patient leans: N/A  Psychiatric Specialty Exam: Physical Exam  Vitals reviewed.   Review of Systems  Neurological: Positive for tremors.  Psychiatric/Behavioral: Positive for substance abuse. The patient is nervous/anxious and has insomnia.   All other systems reviewed and are negative.   Blood pressure 107/73, pulse (!) 101, temperature 97.7 F (36.5 C), temperature source Oral, resp. rate 16, height 5\' 2"  (1.575 m), weight 61 kg (134 lb 7.7 oz), last menstrual period 05/15/2016.Body mass index is 24.6 kg/m.  General Appearance: Casual  Eye Contact:  Fair  Speech:  Clear and Coherent  Volume:  Normal  Mood:  Anxious, Depressed and Dysphoric  Affect:  Appropriate  Thought Process:  Linear and Descriptions of Associations: Intact  Orientation:  Full (Time, Place, and Person)  Thought Content:  Paranoid Ideation and Rumination  Suicidal Thoughts:  No  Homicidal  Thoughts:  No  Memory:  Immediate;   Fair Recent;   Fair Remote;   Fair  Judgement:  Impaired  Insight:  Fair  Psychomotor Activity:  Restlessness and Tremor  Concentration:  Concentration: Fair and  Attention Span: Fair  Recall:  FiservFair  Fund of Knowledge:  Fair  Language:  Good  Akathisia:  No  Handed:  Right  AIMS (if indicated):     Assets:  Communication Skills Desire for Improvement Resilience Social Support  ADL's:  Intact  Cognition:  WNL  Sleep:  Number of Hours: 6.5    Treatment Plan Summary:Patient continues to be anxious , jittery , paranoid , medication seeking - and has poor sleep. Pt os on polypharmacy - will readjust medications and continue treatment.  MDD (major depressive disorder), recurrent severe, without psychosis (HCC) unstable.  Will continue today 06/06/16  plan as below except where it is noted.   Daily contact with patient to assess and evaluate symptoms and progress in treatment and Medication management   EKG reviewed- Normal sinus. (WNL- baseline) Will discontinue Celexa for lack of efficacy. Will increase Remeron to 30 mg po qhs for depression and anxiety sx. Will increase Seroquel to 300 mg po bid and 400 mg po qhs for affective sx, paranoia, sleep issues. Will reduce Seroquel to 25 mg po tid prn for anxiety/agitation. Will continue Trazodone 100 mg po qhs prn for sleep. Start CIWA/ativan prn protocol for BZD withdrawal sx. Will continue to monitor vitals ,medication compliance and treatment side effects while patient is here.  Will order EKG for qtc , will order labs - lipid panel, hba1c, pl  CSW will continue working on disposition.  Patient to participate in therapeutic milieu  Armour Villanueva, MD 06/06/2016, 1:46 PM

## 2016-06-07 DIAGNOSIS — Z809 Family history of malignant neoplasm, unspecified: Secondary | ICD-10-CM

## 2016-06-07 LAB — COMPREHENSIVE METABOLIC PANEL
ALBUMIN: 3.9 g/dL (ref 3.5–5.0)
ALK PHOS: 76 U/L (ref 38–126)
ALT: 175 U/L — ABNORMAL HIGH (ref 14–54)
ANION GAP: 9 (ref 5–15)
AST: 82 U/L — ABNORMAL HIGH (ref 15–41)
BILIRUBIN TOTAL: 0.5 mg/dL (ref 0.3–1.2)
BUN: 23 mg/dL — ABNORMAL HIGH (ref 6–20)
CALCIUM: 9.1 mg/dL (ref 8.9–10.3)
CO2: 25 mmol/L (ref 22–32)
Chloride: 105 mmol/L (ref 101–111)
Creatinine, Ser: 0.87 mg/dL (ref 0.44–1.00)
GFR calc non Af Amer: >60 mL/min (ref >60)
Glucose, Bld: 110 mg/dL — ABNORMAL HIGH (ref 65–99)
POTASSIUM: 4.1 mmol/L (ref 3.5–5.1)
Sodium: 139 mmol/L (ref 135–145)
TOTAL PROTEIN: 7 g/dL (ref 6.5–8.1)

## 2016-06-07 LAB — LIPID PANEL
CHOL/HDL RATIO: 2.6 ratio
CHOLESTEROL: 173 mg/dL (ref 0–200)
HDL: 67 mg/dL (ref >40)
LDL Cholesterol: 76 mg/dL (ref 0–99)
Triglycerides: 152 mg/dL — ABNORMAL HIGH (ref <150)
VLDL: 30 mg/dL (ref 0–40)

## 2016-06-07 MED ORDER — DOXEPIN HCL 25 MG PO CAPS
25.0000 mg | ORAL_CAPSULE | Freq: Every day | ORAL | Status: DC
  Administered 2016-06-07: 25 mg via ORAL
  Filled 2016-06-07 (×2): qty 1

## 2016-06-07 MED ORDER — DOXEPIN HCL 25 MG PO CAPS
25.0000 mg | ORAL_CAPSULE | Freq: Every day | ORAL | Status: DC
  Filled 2016-06-07: qty 21

## 2016-06-07 MED ORDER — MIRTAZAPINE 30 MG PO TABS
30.0000 mg | ORAL_TABLET | Freq: Every day | ORAL | Status: DC
  Filled 2016-06-07: qty 21

## 2016-06-07 MED ORDER — QUETIAPINE FUMARATE 400 MG PO TABS
400.0000 mg | ORAL_TABLET | Freq: Every day | ORAL | Status: DC
  Administered 2016-06-07: 200 mg via ORAL
  Filled 2016-06-07: qty 21
  Filled 2016-06-07: qty 1
  Filled 2016-06-07: qty 2
  Filled 2016-06-07: qty 1

## 2016-06-07 MED ORDER — BENZTROPINE MESYLATE 1 MG PO TABS
1.0000 mg | ORAL_TABLET | Freq: Every day | ORAL | Status: DC
  Filled 2016-06-07: qty 21

## 2016-06-07 MED ORDER — ALBUTEROL SULFATE HFA 108 (90 BASE) MCG/ACT IN AERS
1.0000 | INHALATION_SPRAY | Freq: Four times a day (QID) | RESPIRATORY_TRACT | Status: DC | PRN
  Administered 2016-06-07 – 2016-06-08 (×3): 1 via RESPIRATORY_TRACT
  Filled 2016-06-07: qty 6.7

## 2016-06-07 MED ORDER — PANTOPRAZOLE SODIUM 20 MG PO TBEC
20.0000 mg | DELAYED_RELEASE_TABLET | Freq: Every day | ORAL | Status: DC
  Administered 2016-06-07: 20 mg via ORAL
  Filled 2016-06-07 (×2): qty 21

## 2016-06-07 MED ORDER — LEVOTHYROXINE SODIUM 50 MCG PO TABS
50.0000 ug | ORAL_TABLET | Freq: Every morning | ORAL | Status: DC
  Filled 2016-06-07: qty 21

## 2016-06-07 MED ORDER — QUETIAPINE FUMARATE 300 MG PO TABS
300.0000 mg | ORAL_TABLET | ORAL | Status: DC
  Administered 2016-06-08: 300 mg via ORAL
  Filled 2016-06-07 (×3): qty 1

## 2016-06-07 MED ORDER — GABAPENTIN 600 MG PO TABS
600.0000 mg | ORAL_TABLET | Freq: Three times a day (TID) | ORAL | Status: DC
  Filled 2016-06-07 (×2): qty 63

## 2016-06-07 MED ORDER — QUETIAPINE FUMARATE 300 MG PO TABS
300.0000 mg | ORAL_TABLET | ORAL | Status: DC
  Filled 2016-06-07: qty 42

## 2016-06-07 MED ORDER — SUMATRIPTAN SUCCINATE 25 MG PO TABS
25.0000 mg | ORAL_TABLET | ORAL | Status: DC | PRN
  Administered 2016-06-07 – 2016-06-08 (×4): 25 mg via ORAL
  Filled 2016-06-07 (×4): qty 1

## 2016-06-07 MED ORDER — DICYCLOMINE HCL 20 MG PO TABS
20.0000 mg | ORAL_TABLET | Freq: Three times a day (TID) | ORAL | Status: DC
  Filled 2016-06-07 (×2): qty 63

## 2016-06-07 NOTE — [facility] Suicide Risk Assessment (Signed)
[facility] INPATIENT:  Family/Significant Other Suicide Prevention Education  Suicide Prevention Education:  Education Completed; Early CharsGwen Conaway (pt's mother) [phone] has been identified by the patient as the family member/significant other with whom the patient will be residing, and identified as the person(s) who will aid the patient in the event of a mental health crisis (suicidal ideations/suicide attempt).  With written consent from the patient, the family member/significant other has been provided the following suicide prevention education, prior to the and/or following the discharge of the patient.  The suicide prevention education provided includes the following:  Suicide risk factors  Suicide prevention and interventions  National Suicide Hotline telephone number  [facility] assessment telephone number  [facility] Assistance 911  [facility] and/or [facility] telephone number  Request made of family/significant other to:  Remove weapons (e.g., guns, rifles, knives), all items previously/currently identified as safety concern.    Remove drugs/medications (over-the-counter, prescriptions, illicit drugs), all items previously/currently identified as a safety concern.  The family member/significant other verbalizes understanding of the suicide prevention education information provided.  The family member/significant other agrees to remove the items of safety concern listed above.  Yadriel Kerrigan N Smart LCSW 06/07/2016, 2:54 PM

## 2016-06-07 NOTE — [facility] Group Notes (Signed)
[facility] LCSW Group Therapy  06/07/2016 2:59 PM  Type of Therapy:  Group Therapy  Participation Level:  Did Not Attend-pt stayed in room. Invited.   Summary of Progress/Problems: [facility] Speaker came to talk about his personal journey with substance abuse and addiction. The pt processed ways by which to relate to the speaker. MHA speaker provided handouts and educational information pertaining to groups and services offered by the [facility].   Zaleigh Bermingham N Smart LCSW 06/07/2016, 2:59 PM

## 2016-06-07 NOTE — Tx Team (Signed)
Interdisciplinary Treatment and Diagnostic Plan Update  06/07/2016 Time of Session: 9:30AM Becky [name] MRN: [id number]  Principal Diagnosis: MDD (major depressive disorder), recurrent severe, without psychosis ([place])  Secondary Diagnoses: Principal Problem:   MDD (major depressive disorder), recurrent severe, without psychosis ([place]) Active Problems:   Opioid use disorder, moderate, dependence (HCC)   Sedative, hypnotic or anxiolytic use disorder, severe, dependence ([place])   Cocaine use disorder, severe, dependence ([place])   Cannabis use disorder, severe, dependence ([place])   Current Medications:  Current Facility-Administered Medications  Medication Dose Route Frequency Provider Last Rate Last Dose  . acetaminophen (TYLENOL) tablet 650 mg  650 mg Oral Q6H PRN Ethelene Hal, NP   650 mg at 06/06/16 2041  . albuterol (PROVENTIL HFA;VENTOLIN HFA) 108 (90 Base) MCG/ACT inhaler 1 puff  1 puff Inhalation Q6H PRN Encarnacion Slates, NP   1 puff at 06/07/16 1259  . alum & mag hydroxide-simeth (MAALOX/MYLANTA) 200-200-20 MG/5ML suspension 30 mL  30 mL Oral Q4H PRN Ethelene Hal, NP   30 mL at 06/04/16 1536  . benzocaine (ORAJEL) 10 % mucosal gel   Mouth/Throat QID PRN Laverle Hobby, PA-C      . benztropine (COGENTIN) tablet 1 mg  1 mg Oral QHS Ethelene Hal, NP   1 mg at 06/06/16 1950  . dicyclomine (BENTYL) capsule 20 mg  20 mg Oral TID AC Ethelene Hal, NP   20 mg at 06/07/16 1150  . doxepin (SINEQUAN) capsule 25 mg  25 mg Oral QHS Encarnacion Slates, NP      . doxycycline (VIBRA-TABS) tablet 100 mg  100 mg Oral Q12H Ethelene Hal, NP   100 mg at 06/07/16 0744  . gabapentin (NEURONTIN) capsule 600 mg  600 mg Oral 3 times per day Encarnacion Slates, NP   600 mg at 06/07/16 1413  . hydrOXYzine (ATARAX/VISTARIL) tablet 50 mg  50 mg Oral Q6H PRN Ursula Alert, MD   50 mg at 06/07/16 1413  . ibuprofen (ADVIL,MOTRIN) tablet 400 mg  400 mg Oral Q6H PRN Rozetta Nunnery, NP   400  mg at 06/07/16 0747  . levothyroxine (SYNTHROID, LEVOTHROID) tablet 50 mcg  50 mcg Oral q morning - 10a Ethelene Hal, NP   50 mcg at 06/07/16 [phone]  . LORazepam (ATIVAN) tablet 1 mg  1 mg Oral Q4H PRN Ursula Alert, MD   1 mg at 06/07/16 1150  . magnesium hydroxide (MILK OF MAGNESIA) suspension 30 mL  30 mL Oral Daily PRN Ethelene Hal, NP   30 mL at 06/07/16 0253  . metroNIDAZOLE (FLAGYL) tablet 500 mg  500 mg Oral Q12H Ethelene Hal, NP   500 mg at 06/07/16 0744  . mirtazapine (REMERON) tablet 30 mg  30 mg Oral QHS Ursula Alert, MD   30 mg at 06/06/16 1950  . nicotine polacrilex (NICORETTE) gum 2 mg  2 mg Oral PRN Ursula Alert, MD   2 mg at 06/07/16 1446  . ondansetron (ZOFRAN-ODT) disintegrating tablet 8 mg  8 mg Oral Q8H PRN Rozetta Nunnery, NP   8 mg at 06/03/16 2119  . pantoprazole (PROTONIX) EC tablet 20 mg  20 mg Oral Daily Norman Clay, MD   20 mg at 06/07/16 0744  . polyethylene glycol (MIRALAX / GLYCOLAX) packet 17 g  17 g Oral Daily PRN Ethelene Hal, NP      . QUEtiapine (SEROQUEL) tablet 25 mg  25 mg Oral TID PRN Ursula Alert, MD  25 mg at 06/07/16 1444  . [START ON 06/08/2016] QUEtiapine (SEROQUEL) tablet 300 mg  300 mg Oral [id number] Encarnacion Slates, NP      . QUEtiapine (SEROQUEL) tablet 400 mg  400 mg Oral QHS Encarnacion Slates, NP      . SUMAtriptan (IMITREX) tablet 25 mg  25 mg Oral Q2H PRN Encarnacion Slates, NP       PTA Medications: Prescriptions Prior to Admission  Medication Sig Dispense Refill Last Dose  . busPIRone (BUSPAR) 30 MG tablet Take 30 mg by mouth 3 (three) times daily.   05/31/2016 at Unknown time  . citalopram (CELEXA) 40 MG tablet Take 40 mg by mouth daily.   05/31/2016 at Unknown time  . gabapentin (NEURONTIN) 600 MG tablet Take 600 mg by mouth 3 (three) times daily.   05/31/2016 at Unknown time  . levothyroxine (SYNTHROID, LEVOTHROID) 50 MCG tablet Take 50 mcg by mouth every morning.   05/31/2016 at Unknown time  . mirtazapine (REMERON)  15 MG tablet Take 15 mg by mouth at bedtime.   05/30/2016 at Unknown time  . OLANZapine zydis (ZYPREXA) 20 MG disintegrating tablet Take 20 mg by mouth 2 (two) times daily.   05/31/2016 at Unknown time  . perphenazine (TRILAFON) 4 MG tablet Take 8 mg by mouth 3 (three) times daily.   05/31/2016 at Unknown time  . QUEtiapine (SEROQUEL) 300 MG tablet Take 300 mg by mouth 3 (three) times daily.   05/31/2016 at Unknown time  . traZODone (DESYREL) 150 MG tablet Take 300 mg by mouth at bedtime.    05/30/2016 at Unknown time    Patient Stressors: Financial difficulties Marital or family conflict Substance abuse Other: Psychosis  Patient Strengths: Ability for insight Agricultural engineer for treatment/growth Physical Health  Treatment Modalities: Medication Management, Group therapy, Case management,  1 to 1 session with clinician, Psychoeducation, Recreational therapy.   Physician Treatment Plan for Primary Diagnosis: MDD (major depressive disorder), recurrent severe, without psychosis ([place]) Long Term Goal(s): Improvement in symptoms so as ready for discharge Improvement in symptoms so as ready for discharge   Short Term Goals: Ability to identify changes in lifestyle to reduce recurrence of condition will improve Ability to verbalize feelings will improve Ability to identify and develop effective coping behaviors will improve Compliance with prescribed medications will improve Ability to identify triggers associated with substance abuse/mental health issues will improve Ability to verbalize feelings will improve Ability to identify and develop effective coping behaviors will improve Ability to maintain clinical measurements within normal limits will improve  Medication Management: Evaluate patient's response, side effects, and tolerance of medication regimen.  Therapeutic Interventions: 1 to 1 sessions, Unit Group sessions and Medication administration.  Evaluation of Outcomes:  Met  Physician Treatment Plan for Secondary Diagnosis: Principal Problem:   MDD (major depressive disorder), recurrent severe, without psychosis ([place]) Active Problems:   Opioid use disorder, moderate, dependence (HCC)   Sedative, hypnotic or anxiolytic use disorder, severe, dependence ([place])   Cocaine use disorder, severe, dependence ([place])   Cannabis use disorder, severe, dependence ([place])  Long Term Goal(s): Improvement in symptoms so as ready for discharge Improvement in symptoms so as ready for discharge   Short Term Goals: Ability to identify changes in lifestyle to reduce recurrence of condition will improve Ability to verbalize feelings will improve Ability to identify and develop effective coping behaviors will improve Compliance with prescribed medications will improve Ability to identify triggers associated with substance abuse/mental health issues will improve  Ability to verbalize feelings will improve Ability to identify and develop effective coping behaviors will improve Ability to maintain clinical measurements within normal limits will improve     Medication Management: Evaluate patient's response, side effects, and tolerance of medication regimen.  Therapeutic Interventions: 1 to 1 sessions, Unit Group sessions and Medication administration.  Evaluation of Outcomes: Met   RN Treatment Plan for Primary Diagnosis: MDD (major depressive disorder), recurrent severe, without psychosis ([place]) Long Term Goal(s): Knowledge of disease and therapeutic regimen to maintain health will improve  Short Term Goals: Ability to remain free from injury will improve, Ability to participate in decision making will improve and Ability to verbalize feelings will improve  Medication Management: RN will administer medications as ordered by provider, will assess and evaluate patient's response and provide education to patient for prescribed medication. RN will report any adverse and/or side  effects to prescribing provider.  Therapeutic Interventions: 1 on 1 counseling sessions, Psychoeducation, Medication administration, Evaluate responses to treatment, Monitor vital signs and CBGs as ordered, Perform/monitor CIWA, COWS, AIMS and Fall Risk screenings as ordered, Perform wound care treatments as ordered.  Evaluation of Outcomes: Met  LCSW Treatment Plan for Primary Diagnosis: MDD (major depressive disorder), recurrent severe, without psychosis ([place]) Long Term Goal(s): Safe transition to appropriate next level of care at discharge, Engage patient in therapeutic group addressing interpersonal concerns.  Short Term Goals: Engage patient in aftercare planning with referrals and resources, Facilitate patient progression through stages of change regarding substance use diagnoses and concerns and Identify triggers associated with mental health/substance abuse issues  Therapeutic Interventions: Assess for all discharge needs, 1 to 1 time with Social worker, Explore available resources and support systems, Assess for adequacy in community support network, Educate family and significant other(s) on suicide prevention, Complete Psychosocial Assessment, Interpersonal group therapy.  Evaluation of Outcomes: Met   Progress in Treatment: Attending groups: Yes. Participating in groups: Yes. Taking medication as prescribed: Yes. Toleration medication: Yes. Family/Significant other contact made: SPE completed with pt's mother.  Patient understands diagnosis: Yes. Discussing patient identified problems/goals with staff: Yes. Medical problems stabilized or resolved: Yes. Denies suicidal/homicidal ideation: Yes. Issues/concerns per patient self-inventory: No. Other: n/a  New problem(s) identified: No, Describe:  n/a.  New Short Term/Long Term Goal(s):  Discharge Plan or Barriers:  Pt accepted to [facility] for wed. 06/08/16 at 10:30AM. 21 day supply of medication required. MD and NP notified.    Reason for Continuation of Hospitalization: Depression Medication stabilization Withdrawal symptoms  Estimated Length of Stay: 1 day (pt scheduled for 10:30AM discharge on 06/08/16). [facility]  Attendees: Patient: 06/07/2016 2:57 PM  Physician: Dr. Shea Evans MD 06/07/2016 2:57 PM  Nursing: Demetrius Charity RN 06/07/2016 2:57 PM  RN Care Manager: Lars Pinks CM 06/07/2016 2:57 PM  Social Worker: Maxie Better, LCSW; Adriana Reams LCSW 06/07/2016 2:57 PM  Recreational Therapist:  06/07/2016 2:57 PM  Other: [name] NP; Lindell Spar NP 06/07/2016 2:57 PM  Other:  06/07/2016 2:57 PM  Other: 06/07/2016 2:57 PM    Scribe for Treatment Team: Gila [place], LCSW 06/07/2016 2:57 PM

## 2016-06-07 NOTE — Progress Notes (Signed)
[facility] MD Progress Note  06/07/2016 2:13 PM Becky [name]  MRN:  [id number]   Subjective: Becky [name] reports "My anxiety is bad. I'm still hearing stuff, like voices. I need my Seroquel increased. I need something for migraine headaches. The doctor that saw me yesterday changed my medications around. Now they are not working. I feel jittery. I cannot speak without shaking. Its my anxiety acting up. Make my daytime Seroquel to be given at 8 am & 3 PM & my night Seroquel given at 8 PM. I'm short of breath. Is there anything like inhaler that I can use?"  Objective: Becky [name] is a 26 y old CF, who has a hx of MDD, polysubstance abuse who presented with psychosis and worsening anxiety. Patient seen and chart reviewed. Discussed patient with treatment team.  Pt today is seen as alert, oriented - but anxious and jittery. Pt reports she last used xanax 4 days ago and she feels her withdrawal sx are worsening. Pt currently not on CIWA - since she had told the provider who saw her on admission that her last use of Xanax was weeks ago. Pt seen as anxious, restless - focussed on adding more medications to help with her sx. Pt currently on polypharmacy - discussed making changes . Adjusted the dose of Seroquel since she states she wants to stay on the same dose during the day, but is OK with dosage increase at night. She is on gabapentin for agitation/substance withdrawal syndrome. Continue to offer support.  Principal Problem: MDD (major depressive disorder), recurrent severe, without psychosis ([place]) Diagnosis:   Patient Active Problem List   Diagnosis Date Noted  . Opioid use disorder, moderate, dependence ([place]) [F11.20] 06/06/2016  . Sedative, hypnotic or anxiolytic use disorder, severe, dependence ([place]) [F13.20] 06/06/2016  . Cocaine use disorder, severe, dependence ([place]) [F14.20] 06/06/2016  . Cannabis use disorder, severe, dependence ([place]) [F12.20] 06/06/2016  . MDD (major depressive disorder), recurrent  severe, without psychosis ([place]) [F33.2] 06/03/2016  . Bipolar I disorder, most recent episode depressed ([place]) [F31.30] 07/04/2015  . Overdose [T50.901A] 07/02/2015  . Lactic acidosis [E87.2] 07/02/2015  . Acute respiratory failure ([place]) [J96.00] 07/02/2015  . Sepsis ([place]) [A41.9] 07/02/2015  . AKI (acute kidney injury) ([place]) [N17.9]   . Altered mental status [R41.82]   . Pyrexia [R50.9]   . Abdominal pain, epigastric [R10.13] 06/17/2015  . Hepatitis C antibody test positive [R76.8] 06/17/2015  . Peptic ulcer disease [K27.9] 06/17/2015  . Epigastric pain [R10.13]   . Transaminitis [R74.0] 06/16/2015  . History of substance abuse [Z87.898] 06/16/2015  . Bipolar affective disorder, current episode manic with psychotic symptoms ([place]) [F31.2] 03/03/2014   Total Time spent with patient: 15 minutes  Past Psychiatric History: Please see H&P.   Past Medical History:  Past Medical History:  Diagnosis Date  . Bipolar 1 disorder ([place])   . Bronchitis   . GAD (generalized anxiety disorder)   . Hepatitis C antibody test positive 06/17/2015  . History of substance abuse 06/16/2015  . OCD (obsessive compulsive disorder)   . Peptic ulcer   . Polysubstance abuse   . PTSD (post-traumatic stress disorder)     Past Surgical History:  Procedure Laterality Date  . OTHER SURGICAL HISTORY     scar tissue removed from right ovary   . OTHER SURGICAL HISTORY     fallopian tube repair  . WISDOM TOOTH EXTRACTION     Family History:  Family History  Problem Relation Age of Onset  . Drug abuse Brother   .  Suicidality Brother   . Cancer Other   . Depression Mother   . Drug abuse Mother   . Alcoholism Father   . Bipolar disorder Cousin    Family Psychiatric  History: Please see H&P.  Social History: Please see H&P.  History  Alcohol Use  . Yes    Comment: occ     History  Drug Use  . Types: Marijuana, Methamphetamines, MDMA (Ecstacy), Oxycodone, Benzodiazepines, Cocaine    Comment: 14  months ago heroin- pt states "anything i can get my hands on."  Last used percocet 2 days ago.     Social History   Social History  . Marital status: Legally Separated    Spouse name: N/A  . Number of children: N/A  . Years of education: N/A   Social History Main Topics  . Smoking status: Current Every Day Smoker    Packs/day: 1.00    Years: 10.00    Types: Cigarettes  . Smokeless tobacco: Never Used  . Alcohol use Yes     Comment: occ  . Drug use: Yes    Types: Marijuana, Methamphetamines, MDMA (Ecstacy), Oxycodone, Benzodiazepines, Cocaine     Comment: 14 months ago heroin- pt states "anything i can get my hands on."  Last used percocet 2 days ago.   Marland Kitchen Sexual activity: Yes    Birth control/ protection: None   Other Topics Concern  . None   Social History Narrative  . None   Additional Social History:   Sleep: Poor  Appetite:  Fair  Current Medications: Current Facility-Administered Medications  Medication Dose Route Frequency Provider Last Rate Last Dose  . acetaminophen (TYLENOL) tablet 650 mg  650 mg Oral Q6H PRN Ethelene Hal, NP   650 mg at 06/06/16 2041  . albuterol (PROVENTIL HFA;VENTOLIN HFA) 108 (90 Base) MCG/ACT inhaler 1 puff  1 puff Inhalation Q6H PRN Encarnacion Slates, NP   1 puff at 06/07/16 1259  . alum & mag hydroxide-simeth (MAALOX/MYLANTA) 200-200-20 MG/5ML suspension 30 mL  30 mL Oral Q4H PRN Ethelene Hal, NP   30 mL at 06/04/16 1536  . benzocaine (ORAJEL) 10 % mucosal gel   Mouth/Throat QID PRN Laverle Hobby, PA-C      . benztropine (COGENTIN) tablet 1 mg  1 mg Oral QHS Ethelene Hal, NP   1 mg at 06/06/16 1950  . dicyclomine (BENTYL) capsule 20 mg  20 mg Oral TID AC Ethelene Hal, NP   20 mg at 06/07/16 1150  . doxepin (SINEQUAN) capsule 25 mg  25 mg Oral QHS Encarnacion Slates, NP      . doxycycline (VIBRA-TABS) tablet 100 mg  100 mg Oral Q12H Ethelene Hal, NP   100 mg at 06/07/16 0744  . gabapentin (NEURONTIN)  capsule 600 mg  600 mg Oral 3 times per day Encarnacion Slates, NP   600 mg at 06/07/16 1413  . hydrOXYzine (ATARAX/VISTARIL) tablet 50 mg  50 mg Oral Q6H PRN Ursula Alert, MD   50 mg at 06/07/16 1413  . ibuprofen (ADVIL,MOTRIN) tablet 400 mg  400 mg Oral Q6H PRN Rozetta Nunnery, NP   400 mg at 06/07/16 0747  . levothyroxine (SYNTHROID, LEVOTHROID) tablet 50 mcg  50 mcg Oral q morning - 10a Ethelene Hal, NP   50 mcg at 06/07/16 [phone]  . LORazepam (ATIVAN) tablet 1 mg  1 mg Oral Q4H PRN Ursula Alert, MD   1 mg at 06/07/16 1150  .  magnesium hydroxide (MILK OF MAGNESIA) suspension 30 mL  30 mL Oral Daily PRN Ethelene Hal, NP   30 mL at 06/07/16 0253  . metroNIDAZOLE (FLAGYL) tablet 500 mg  500 mg Oral Q12H Ethelene Hal, NP   500 mg at 06/07/16 0744  . mirtazapine (REMERON) tablet 30 mg  30 mg Oral QHS Ursula Alert, MD   30 mg at 06/06/16 1950  . nicotine polacrilex (NICORETTE) gum 2 mg  2 mg Oral PRN Ursula Alert, MD   2 mg at 06/07/16 1256  . ondansetron (ZOFRAN-ODT) disintegrating tablet 8 mg  8 mg Oral Q8H PRN Rozetta Nunnery, NP   8 mg at 06/03/16 2119  . pantoprazole (PROTONIX) EC tablet 20 mg  20 mg Oral Daily Norman Clay, MD   20 mg at 06/07/16 0744  . polyethylene glycol (MIRALAX / GLYCOLAX) packet 17 g  17 g Oral Daily PRN Ethelene Hal, NP      . QUEtiapine (SEROQUEL) tablet 25 mg  25 mg Oral TID PRN Ursula Alert, MD   25 mg at 06/07/16 0755  . [START ON 06/08/2016] QUEtiapine (SEROQUEL) tablet 300 mg  300 mg Oral [id number] Encarnacion Slates, NP      . QUEtiapine (SEROQUEL) tablet 400 mg  400 mg Oral QHS Encarnacion Slates, NP      . SUMAtriptan (IMITREX) tablet 25 mg  25 mg Oral Q2H PRN Encarnacion Slates, NP       Lab Results:  Results for orders placed or performed during the hospital encounter of 06/03/16 (from the past 48 hour(s))  Lipid panel     Status: Abnormal   Collection Time: 06/07/16  6:27 AM  Result Value Ref Range   Cholesterol 173 0 - 200 mg/dL    Triglycerides 152 (H) <150 mg/dL   HDL 67 >40 mg/dL   Total CHOL/HDL Ratio 2.6 RATIO   VLDL 30 0 - 40 mg/dL   LDL Cholesterol 76 0 - 99 mg/dL    Comment:        Total Cholesterol/HDL:CHD Risk Coronary Heart Disease Risk Table                     Men   Women  1/2 Average Risk   3.4   3.3  Average Risk       5.0   4.4  2 X Average Risk   9.6   7.1  3 X Average Risk  23.4   11.0        Use the calculated Patient Ratio above and the CHD Risk Table to determine the patient's CHD Risk.        ATP III CLASSIFICATION (LDL):  <100     mg/dL   Optimal  100-129  mg/dL   Near or Above                    Optimal  130-159  mg/dL   Borderline  160-189  mg/dL   High  >190     mg/dL   Very High Performed at [facility] [address] [place], [place] [address]   Comprehensive metabolic panel     Status: Abnormal   Collection Time: 06/07/16  6:27 AM  Result Value Ref Range   Sodium 139 135 - 145 mmol/L   Potassium 4.1 3.5 - 5.1 mmol/L   Chloride 105 101 - 111 mmol/L   CO2 25 22 - 32 mmol/L   Glucose,  Bld 110 (H) 65 - 99 mg/dL   BUN 23 (H) 6 - 20 mg/dL   Creatinine, Ser 0.87 0.44 - 1.00 mg/dL   Calcium 9.1 8.9 - 10.3 mg/dL   Total Protein 7.0 6.5 - 8.1 g/dL   Albumin 3.9 3.5 - 5.0 g/dL   AST 82 (H) 15 - 41 U/L   ALT 175 (H) 14 - 54 U/L   Alkaline Phosphatase 76 38 - 126 U/L   Total Bilirubin 0.5 0.3 - 1.2 mg/dL   GFR calc non Af Amer >60 >60 mL/min   GFR calc Af Amer >60 >60 mL/min    Comment: (NOTE) The eGFR has been calculated using the CKD EPI equation. This calculation has not been validated in all clinical situations. eGFR's persistently <60 mL/min signify possible Chronic Kidney Disease.    Anion gap 9 5 - 15    Comment: Performed at [facility], [place] [address] [place],  [address]   Blood Alcohol level:  Lab Results  Component Value Date   [facility] <5 05/31/2016   ETH <5 64/33/2951   Metabolic Disorder Labs: Lab Results  Component  Value Date   HGBA1C 5.7 (H) 03/05/2014   MPG 117 (H) 03/05/2014   No results found for: PROLACTIN Lab Results  Component Value Date   CHOL 173 06/07/2016   TRIG 152 (H) 06/07/2016   HDL 67 06/07/2016   CHOLHDL 2.6 06/07/2016   VLDL 30 06/07/2016   LDLCALC 76 06/07/2016   LDLCALC 130 (H) 03/05/2014   Physical Findings: AIMS: Facial and Oral Movements Muscles of Facial Expression: None, normal Lips and Perioral Area: None, normal Jaw: None, normal Tongue: None, normal,Extremity Movements Upper (arms, wrists, hands, fingers): None, normal Lower (legs, knees, ankles, toes): None, normal, Trunk Movements Neck, shoulders, hips: None, normal, Overall Severity Severity of abnormal movements (highest score from questions above): None, normal Incapacitation due to abnormal movements: None, normal Patient's awareness of abnormal movements (rate only patient's report): No Awareness, Dental Status Current problems with teeth and/or dentures?: No Does patient usually wear dentures?: No  CIWA:  CIWA-Ar Total: 17 COWS:     Musculoskeletal: Strength & Muscle Tone: within normal limits Gait & Station: normal Patient leans: N/A  Psychiatric Specialty Exam: Physical Exam  Vitals reviewed.   Review of Systems  Neurological: Positive for tremors.  Psychiatric/Behavioral: Positive for substance abuse. The patient is nervous/anxious and has insomnia.   All other systems reviewed and are negative.   Blood pressure 127/76, pulse (!) 138, temperature 97.8 F (36.6 C), temperature source Oral, resp. rate 20, height '5\' 2"'  (1.575 m), weight 61 kg (134 lb 7.7 oz), last menstrual period 05/15/2016.Body mass index is 24.6 kg/m.  General Appearance: Casual  Eye Contact:  Fair  Speech:  Clear and Coherent  Volume:  Normal  Mood:  Anxious, Depressed and Dysphoric  Affect:  Appropriate  Thought Process:  Linear and Descriptions of Associations: Intact  Orientation:  Full (Time, Place, and Person)   Thought Content:  Paranoid Ideation and Rumination  Suicidal Thoughts:  No  Homicidal Thoughts:  No  Memory:  Immediate;   Fair Recent;   Fair Remote;   Fair  Judgement:  Impaired  Insight:  Fair  Psychomotor Activity:  Restlessness and Tremor  Concentration:  Concentration: Fair and Attention Span: Fair  Recall:  [employer] of Knowledge:  Fair  Language:  Good  Akathisia:  No  Handed:  Right  AIMS (if indicated):     Assets:  Communication Skills Desire for Improvement Resilience Social Support  ADL's:  Intact  Cognition:  WNL  Sleep:  Number of Hours: 6.5    Treatment Plan Summary: Patient continues to be anxious, jittery, paranoid, medication seeking - and has poor sleep pattern, she reported. Pt os on polypharmacy - will readjust medications and continue treatment.  MDD (major depressive disorder), recurrent severe, without psychosis ([place]) Unstable.  Will continue today 06/07/16  plan as below except where it is noted.  Daily contact with patient to assess and evaluate symptoms and progress in treatment and Medication management   EKG reviewed- Normal sinus. (WNL- baseline) Discontinued Celexa for lack of efficacy. Will continue Remeron 30 mg po qhs for depression and anxiety sx. Will continue Seroquel 300 mg po bid and 400 mg po qhs for affective sx, paranoia, sleep issues. Will continue Seroquel 25 mg po tid prn for anxiety/agitation. Will discontinueTrazodone due to night terrors. Will continue Doxepin 25 mg Q hs for insomnia.  Will continue CIWA/ativan prn protocol for BZD withdrawal sx. Will continue to monitor vitals ,medication compliance and treatment side effects while patient is here.  Will order EKG for qtc, will order labs - lipid panel, result reviewed, elevated Triglyceride 152, hba1c. Result pending,  CSW will continue working on disposition, patient accepted at [facility]. Patient to participate in therapeutic milieu  Encarnacion Slates, NP, PMHNP,  FNP-BC. 06/07/2016, 2:13 PMPatient ID: Becky [name], female   DOB: 1991-01-19, 26 y.o.   MRN: [id number]

## 2016-06-07 NOTE — Progress Notes (Signed)
The patient arrived late for the A.A.meeting since she had initially refused to attend the group. She was observed socializing with the same female peer in the hallway and was redirected.

## 2016-06-07 NOTE — B[facility] Group Notes (Signed)
The focus of this group is to educate the patient on the purpose and policies of crisis stabilization and provide a format to answer questions about their admission.  The group details unit policies and expectations of patients while admitted.  Patient did not attend 0900 nurse education orientation group this morning.  Patient stayed in bed.   

## 2016-06-07 NOTE — Progress Notes (Signed)
Recreation Therapy Notes  Animal-Assisted Activity (AAA) Program Checklist/Progress Notes Patient Eligibility Criteria Checklist & Daily Group note for Rec TxIntervention  Date: 01.23.2018 Time: 2:50pm Location: 400 Hall Dayroom    AAA/T Program Assumption of Risk Form signed by Patient/ or Parent Legal Guardian Yes  Patient is free of allergies or sever asthma Yes  Patient reports no fear of animals Yes  Patient reports no history of cruelty to animals Yes  Patient understands his/her participation is voluntary Yes  Patient washes hands before animal contact Yes  Patient washes hands after animal contact Yes  Behavioral Response: Appropriate   Education:Hand Washing, Appropriate Animal Interaction   Education Outcome: Acknowledges education.   Clinical Observations/Feedback: Patient attended session and interacted appropriately with therapy dog and peers.   Saban Heinlen L Sampson Self, LRT/CTRS           Bessie Boyte L 06/07/2016 3:04 PM 

## 2016-06-07 NOTE — Progress Notes (Signed)
Nursing Note: 0700-1900  D:  Pt presents anxious/depressed mood and anxious affect.  Pt remained anxious throughout shift, numerous interactions, including 16 prn meds today. Pt verbalizes excitement and hope [facility].  A:  Encouraged to verbalize needs and concerns, active listening and support provided.  Continued Q 15 minute safety checks.    R:  Pt. denies A/V hallucinations and is able to verbally contract for safety.

## 2016-06-07 NOTE — Progress Notes (Addendum)
  [facility] :  Will you be returning to the same living situation after discharge:  No. [facility] At discharge, do you have transportation home?: Yes,  [facility] driver will be at hospital by 10:30AM on Wed 1/24to transport her to facility. Do you have the ability to pay for your medications: Yes,  mental health  Release of information consent forms completed and in the chart;  Patient's signature needed at discharge.  Patient to Follow up at: Follow-up Information    [facility] Follow up on 06/08/2016.   Why:  You have been accepted for treatment on this date at 10:30AM. Please make sure that you have 21 day supply of all medications. Thank you.  Contact information: [address] [place], [place] [address] Phone: [phone] Fax: [phone]          Next level of care provider has access to [facility] Link:no  Safety Planning and Suicide Prevention discussed: Yes,  SPE completed with pt's mother. SPI pamphlet and Mobile Crisis information provided.  Have you used any form of tobacco in the last 30 days? (Cigarettes, Smokeless Tobacco, Cigars, and/or Pipes): Yes  Has patient been referred to the Quitline?: Patient refused referral  Patient has been referred for addiction treatment: Yes  Ezra Marquess N Smart LCSW 06/07/2016, 2:55 PM

## 2016-06-08 LAB — HEMOGLOBIN A1C
HEMOGLOBIN A1C: 4.9 % (ref 4.8–5.6)
Mean Plasma Glucose: 94 mg/dL

## 2016-06-08 LAB — PROLACTIN: PROLACTIN: 49.9 ng/mL — AB (ref 4.8–23.3)

## 2016-06-08 MED ORDER — PANTOPRAZOLE SODIUM 20 MG PO TBEC: 20.0000 mg | DELAYED_RELEASE_TABLET | Freq: Every day | ORAL | 0 refills | Status: DC

## 2016-06-08 MED ORDER — QUETIAPINE FUMARATE 300 MG PO TABS: 300.0000 mg | ORAL_TABLET | ORAL | 0 refills | Status: DC

## 2016-06-08 MED ORDER — GABAPENTIN 300 MG PO CAPS: 600.0000 mg | ORAL_CAPSULE | Freq: Three times a day (TID) | ORAL | 0 refills | Status: DC

## 2016-06-08 MED ORDER — BENZTROPINE MESYLATE 1 MG PO TABS: 1.0000 mg | ORAL_TABLET | Freq: Every day | ORAL | 0 refills | Status: DC

## 2016-06-08 MED ORDER — NICOTINE POLACRILEX 2 MG MT GUM: 2.0000 mg | CHEWING_GUM | OROMUCOSAL | 0 refills | Status: DC | PRN

## 2016-06-08 MED ORDER — MIRTAZAPINE 30 MG PO TABS: 30.0000 mg | ORAL_TABLET | Freq: Every day | ORAL | 0 refills | Status: DC

## 2016-06-08 MED ORDER — LEVOTHYROXINE SODIUM 50 MCG PO TABS: 50.0000 ug | ORAL_TABLET | Freq: Every morning | ORAL | 0 refills | Status: DC

## 2016-06-08 MED ORDER — DOXEPIN HCL 25 MG PO CAPS: 25.0000 mg | ORAL_CAPSULE | Freq: Every day | ORAL | 0 refills | Status: DC

## 2016-06-08 MED ORDER — HYDROXYZINE HCL 50 MG PO TABS: 50.0000 mg | ORAL_TABLET | Freq: Four times a day (QID) | ORAL | 0 refills | Status: DC | PRN

## 2016-06-08 MED ORDER — QUETIAPINE FUMARATE 400 MG PO TABS: 400.0000 mg | ORAL_TABLET | Freq: Every day | ORAL | 0 refills | Status: DC

## 2016-06-08 NOTE — Plan of Care (Signed)
Problem: Activity: Goal: Sleeping patterns will improve Outcome: Progressing Patient has been resting in bed since 2330. Patient is without issues or complaints. Respirations even and unlabored.  Problem: Safety: Goal: Periods of time without injury will increase Outcome: Progressing Patient remains safe on the unit. Patient denies SI and contracts for safety.

## 2016-06-08 NOTE — Progress Notes (Signed)
Nursing Progress Note 7p-7a  D) Patient presents with flat affect and is anxious. Patient requesting medications and PRNs exactly when available. Patient often needs to be reminded when medications are not available yet. Patient is cooperative and pleasant but does report high anxiety. Patient denies SI/HI/AVH or pain. Patient reports she sometimes "hears whispers" but they "come and go".  Patient contracts for safety at this time. Patient states she is leaving tomorrow and is anxious but "looking forward to discharge". Patient states she is trying to work on her coping skills "but they don't work as well as medication".   A) Emotional support given. Patient medicated with PM orders as prescribed. Medications reviewed with patient. Patient on q15 min safety checks. Opportunities for questions or concerns presented to patient. Patient encouraged to continue to work on treatment goals. Patient encouraged to utilize coping skills and not always rely on medications.  R) Patient receptive to interaction with nurse. Patient remains safe on the unit at this time. Patient is resting in bed without complaints. Will continue to monitor.

## 2016-06-08 NOTE — Discharge Summary (Signed)
Physician Discharge Summary Note  Patient:  Becky [name] is an 26 y.o., female MRN:  [id number] DOB:  02/07/1991 Patient phone:  [phone] (home)  Patient address:   [address] [place] [address],  Total Time spent with patient: 30 minutes  Date of Admission:  06/03/2016 Date of Discharge: 06/08/2016   Reason for Admission:  Hallucinations and paranoia, cocaine use  Principal Problem: MDD (major depressive disorder), recurrent severe, without psychosis [facility]) Discharge Diagnoses: Patient Active Problem List   Diagnosis Date Noted  . Opioid use disorder, moderate, dependence (HCC) [F11.20] 06/06/2016  . Sedative, hypnotic or anxiolytic use disorder, severe, dependence (HCC) [F13.20] 06/06/2016  . Cocaine use disorder, severe, dependence (HCC) [F14.20] 06/06/2016  . Cannabis use disorder, severe, dependence (HCC) [F12.20] 06/06/2016  . MDD (major depressive disorder), recurrent severe, without psychosis (HCC) [F33.2] 06/03/2016  . Bipolar I disorder, most recent episode depressed (HCC) [F31.30] 07/04/2015  . Overdose [T50.901A] 07/02/2015  . Lactic acidosis [E87.2] 07/02/2015  . Acute respiratory failure (HCC) [J96.00] 07/02/2015  . Sepsis (HCC) [A41.9] 07/02/2015  . AKI (acute kidney injury) (HCC) [N17.9]   . Altered mental status [R41.82]   . Pyrexia [R50.9]   . Abdominal pain, epigastric [R10.13] 06/17/2015  . Hepatitis C antibody test positive [R76.8] 06/17/2015  . Peptic ulcer disease [K27.9] 06/17/2015  . Epigastric pain [R10.13]   . Transaminitis [R74.0] 06/16/2015  . History of substance abuse [Z87.898] 06/16/2015  . Bipolar affective disorder, current episode manic with psychotic symptoms (HCC) [F31.2] 03/03/2014    Past Psychiatric History: see HPI  Past Medical History:  Past Medical History:  Diagnosis Date  . Bipolar 1 disorder (HCC)   . Bronchitis   . GAD (generalized anxiety disorder)   . Hepatitis C antibody test positive 06/17/2015  . History  of substance abuse 06/16/2015  . OCD (obsessive compulsive disorder)   . Peptic ulcer   . Polysubstance abuse   . PTSD (post-traumatic stress disorder)     Past Surgical History:  Procedure Laterality Date  . OTHER SURGICAL HISTORY     scar tissue removed from right ovary   . OTHER SURGICAL HISTORY     fallopian tube repair  . WISDOM TOOTH EXTRACTION     Family History:  Family History  Problem Relation Age of Onset  . Drug abuse Brother   . Suicidality Brother   . Cancer Other   . Depression Mother   . Drug abuse Mother   . Alcoholism Father   . Bipolar disorder Cousin    Family Psychiatric  History: see HPI Social History:  History  Alcohol Use  . Yes    Comment: occ     History  Drug Use  . Types: Marijuana, Methamphetamines, MDMA (Ecstacy), Oxycodone, Benzodiazepines, Cocaine    Comment: 14 months ago heroin- pt states "anything i can get my hands on."  Last used percocet 2 days ago.     Social History   Social History  . Marital status: Legally Separated    Spouse name: N/A  . Number of children: N/A  . Years of education: N/A   Social History Main Topics  . Smoking status: Current Every Day Smoker    Packs/day: 1.00    Years: 10.00    Types: Cigarettes  . Smokeless tobacco: Never Used  . Alcohol use Yes     Comment: occ  . Drug use: Yes    Types: Marijuana, Methamphetamines, MDMA (Ecstacy), Oxycodone, Benzodiazepines, Cocaine     Comment: 14  months ago heroin- pt states "anything i can get my hands on."  Last used percocet 2 days ago.   Marland Kitchen Sexual activity: Yes    Birth control/ protection: None   Other Topics Concern  . None   Social History Narrative  . None    Hospital Course:   Becky [name] was admitted for MDD (major depressive disorder), recurrent severe, without psychosis (HCC) and crisis management.  Patient was treated with medications with their indications listed below in detail under Medication List.  Medical problems were  identified and treated as needed.  Home medications were restarted as appropriate.  Improvement was monitored by observation and Becky [name] daily report of symptom reduction.  Emotional and mental status was monitored by daily self inventory reports completed by Becky [name] and clinical staff.  Patient reported continued improvement, denied any new concerns.  Patient had been compliant on medications and denied side effects.  Support and encouragement was provided.         Becky [name] was evaluated by the treatment team for stability and plans for continued recovery upon discharge.  Patient was offered further treatment options upon discharge including Residential, Intensive Outpatient and Outpatient treatment. Patient will follow up with agency listed below for medication management and counseling.  Encouraged patient to maintain satisfactory support network and home environment.  Advised to adhere to medication compliance and outpatient treatment follow up.  Prescriptions provided.       Becky [name] motivation was an integral factor for scheduling further treatment.  Employment, transportation, bed availability, health status, family support, and any pending legal issues were also considered during patient's hospital stay.  Upon completion of this admission the patient was both mentally and medically stable for discharge denying suicidal/homicidal ideation, auditory/visual/tactile hallucinations, delusional thoughts and paranoia.      Physical Findings: AIMS: Facial and Oral Movements Muscles of Facial Expression: None, normal Lips and Perioral Area: None, normal Jaw: None, normal Tongue: None, normal,Extremity Movements Upper (arms, wrists, hands, fingers): None, normal Lower (legs, knees, ankles, toes): None, normal, Trunk Movements Neck, shoulders, hips: None, normal, Overall Severity Severity of abnormal movements (highest score from questions above): None,  normal Incapacitation due to abnormal movements: None, normal Patient's awareness of abnormal movements (rate only patient's report): No Awareness, Dental Status Current problems with teeth and/or dentures?: No Does patient usually wear dentures?: No  CIWA:  CIWA-Ar Total: 7 COWS:     Musculoskeletal: Strength & Muscle Tone: within normal limits Gait & Station: normal Patient leans: N/A  Psychiatric Specialty Exam: Physical Exam  Nursing note and vitals reviewed. Psychiatric: She has a normal mood and affect. Her speech is normal and behavior is normal. Thought content normal.    ROS  Blood pressure 120/73, pulse (!) 118, temperature 98.5 F (36.9 C), temperature source Oral, resp. rate 18, height 5\' 2"  (1.575 m), weight 61 kg (134 lb 7.7 oz), last menstrual period 05/15/2016.Body mass index is 24.6 kg/m.    Have you used any form of tobacco in the last 30 days? (Cigarettes, Smokeless Tobacco, Cigars, and/or Pipes): Yes  Has this patient used any form of tobacco in the last 30 days? (Cigarettes, Smokeless Tobacco, Cigars, and/or Pipes) Yes, No  Blood Alcohol level:  Lab Results  Component Value Date   [facility] <5 05/31/2016   ETH <5 05/19/2016    Metabolic Disorder Labs:  Lab Results  Component Value Date   HGBA1C 4.9 06/07/2016   MPG 94 06/07/2016   MPG 117 (H) 03/05/2014  Lab Results  Component Value Date   PROLACTIN 49.9 (H) 06/07/2016   Lab Results  Component Value Date   CHOL 173 06/07/2016   TRIG 152 (H) 06/07/2016   HDL 67 06/07/2016   CHOLHDL 2.6 06/07/2016   VLDL 30 06/07/2016   LDLCALC 76 06/07/2016   LDLCALC 130 (H) 03/05/2014    See Psychiatric Specialty Exam and Suicide Risk Assessment completed by Attending Physician prior to discharge.  Discharge destination:  Home  Is patient on multiple antipsychotic therapies at discharge:  No   Has Patient had three or more failed trials of antipsychotic monotherapy by history:  No  Recommended Plan for  Multiple Antipsychotic Therapies: NA   Allergies as of 06/08/2016      Reactions   Azithromycin Anaphylaxis   Nsaids Other (See Comments)   Peptic ulcers      Medication List    STOP taking these medications   busPIRone 30 MG tablet Commonly known as:  BUSPAR   citalopram 40 MG tablet Commonly known as:  CELEXA   gabapentin 600 MG tablet Commonly known as:  NEURONTIN Replaced by:  gabapentin 300 MG capsule   OLANZapine zydis 20 MG disintegrating tablet Commonly known as:  ZYPREXA   perphenazine 4 MG tablet Commonly known as:  TRILAFON   traZODone 150 MG tablet Commonly known as:  DESYREL     TAKE these medications     Indication  benztropine 1 MG tablet Commonly known as:  COGENTIN Take 1 tablet (1 mg total) by mouth at bedtime.  Indication:  Extrapyramidal Reaction caused by Medications   doxepin 25 MG capsule Commonly known as:  SINEQUAN Take 1 capsule (25 mg total) by mouth at bedtime.  Indication:  Insomnia   gabapentin 300 MG capsule Commonly known as:  NEURONTIN Take 2 capsules (600 mg total) by mouth 3 (three) times daily. Replaces:  gabapentin 600 MG tablet  Indication:  Agitation   hydrOXYzine 50 MG tablet Commonly known as:  ATARAX/VISTARIL Take 1 tablet (50 mg total) by mouth every 6 (six) hours as needed for anxiety (CIWA LESS THAN 10).  Indication:  Anxiety Neurosis   levothyroxine 50 MCG tablet Commonly known as:  SYNTHROID, LEVOTHROID Take 1 tablet (50 mcg total) by mouth every morning.  Indication:  Underactive Thyroid   mirtazapine 30 MG tablet Commonly known as:  REMERON Take 1 tablet (30 mg total) by mouth at bedtime. What changed:  medication strength  how much to take  Indication:  Trouble Sleeping   nicotine polacrilex 2 MG gum Commonly known as:  NICORETTE Take 1 each (2 mg total) by mouth as needed for smoking cessation.  Indication:  Nicotine Addiction   pantoprazole 20 MG tablet Commonly known as:  PROTONIX Take 1  tablet (20 mg total) by mouth daily. Start taking on:  06/09/2016  Indication:  Gastroesophageal Reflux Disease   QUEtiapine 300 MG tablet Commonly known as:  SEROQUEL Take 1 tablet (300 mg total) by mouth 2 (two) times daily at 7 am and 12 noon. What changed:  when to take this  Indication:  mood stabilization   QUEtiapine 400 MG tablet Commonly known as:  SEROQUEL Take 1 tablet (400 mg total) by mouth at bedtime. What changed:  You were already taking a medication with the same name, and this prescription was added. Make sure you understand how and when to take each.  Indication:  mood stabilization      Follow-up Information    [facility] Follow up on  06/08/2016.   Why:  You have been accepted for treatment on this date at 10:30AM. Please make sure that you have 21 day supply of all medications. Thank you.  Contact information: [address] [place], [place] [address] Phone: [phone] Fax: [phone]         Follow-up recommendations:  Activity:  as tol Diet:  as tol  Comments:  1.  Take all your medications as prescribed.   2.  Report any adverse side effects to outpatient provider. 3.  Patient instructed to not use alcohol or illegal drugs while on prescription medicines. 4.  In the event of worsening symptoms, instructed patient to call 911, the crisis hotline or go to nearest emergency room for evaluation of symptoms.  Signed: Lindwood QuaSheila May Takiah Maiden, NP [facility] 06/08/2016, 10:23 AM

## 2016-06-08 NOTE — Progress Notes (Signed)
Recreation Therapy Notes  Date: 06/08/16 Time: 0930 Location: 300 Hall Dayroom  Group Topic: Stress Management  Goal Area(s) Addresses:  Patient will verbalize importance of using healthy stress management.  Patient will identify positive emotions associated with healthy stress management.   Intervention: Stress Management  Activity :  Body Scan Meditation.  LRT introduced the stress management technique of meditation.  LRT played [name] meditation from the calm app that allowed patients the opportunity to focus on any tension they may be experiencing in their bodies.  Patients were to follow along with the meditation.  Education:  Stress Management, Discharge Planning.   Education Outcome: Acknowledges edcuation/In group clarification offered/Needs additional education  Clinical Observations/Feedback: Pt did not attend group.    Becky [name], LRT/CTRS         Becky [name] 06/08/2016 2:50 PM 

## 2016-06-08 NOTE — Progress Notes (Signed)
Discharge note: Pt received both written and verbal discharge instructions. Pt verbalized understanding of discharge instructions. Pt agreed to f/u appt and med regimen. Pt received sample meds, prescriptions, AVS, SRA, transition record and belonging from room and locker. Pt safely discharged to lobby to be picked up by [facility].

## 2016-06-08 NOTE — Progress Notes (Signed)
At approximately 1445, CSW was notified by unit secretary that someone was on the phone from a facility, stating that it was an emergency. When CSW answered the call, Shayla from [facility] ([place] reports that this patient had overdosed on some of the medication (Seroquel) [facility] Per Drema PryShayla, [facility]. Per Drema PryShayla, the identified patient had only 12 of the 21 Seroquel that were provided at discharge. Drema PryShayla also reports that their staff also dosed her at their facility. Shayla requested that we ensure that our staff will hand off the medications directly [facility]. The identified patient was not this writer's patient while hospitalized. CSW informed [employer] Smart CSW, who was working with the patient, about the situation. CSW also informed Liborio NixonPatrice White, Diplomatic Services operational officerdischarging RN. These staff informed me that Pt was discharged to the lobby, along with samples of medications, to [facility]. Per staff and as observed by this writer, Pt was visibly anxious and continued to request that she be discharged 15 min prior to arrival time of the driver. CSW notified Malva LimesLinsey Strader, [facility] on duty about the incident as well. CSW provided Linsey with corresponding Safety Zone submission number.  CSW consulted Gretta CoolZackary Brooks, ChiropodistAssistant Director of Social Work as well.   Vernie ShanksLauren Tiyon Sanor, LCSW Clinical Social Work [phone]

## 2016-06-08 NOTE — Progress Notes (Signed)
Patient awakened several times after 0330. Patient states "I went right to sleep last night around eleven, this is the most I have slept since I've been here". Patient states she has racing thoughts and that it is difficult to stay in her room. Patient states "I am getting anxious about discharging". Patient requesting the dayroom to be opened early "so that I don't wake up my roommate". Patient medicated with PRNs as needed and informed day room would open at 0530. Patient requested ginger ale and complained of a "dry mouth". Patient stated her PRN medications "have help calmed me". Patient currently interacting in the dayroom with peers.

## 2016-06-08 NOTE — [facility] Suicide Risk Assessment (Signed)
[facility]   Principal Problem: MDD (major depressive disorder), recurrent severe, without psychosis (HCC) Discharge Diagnoses:  Patient Active Problem List   Diagnosis Date Noted  . Opioid use disorder, moderate, dependence (HCC) [F11.20] 06/06/2016  . Sedative, hypnotic or anxiolytic use disorder, severe, dependence (HCC) [F13.20] 06/06/2016  . Cocaine use disorder, severe, dependence (HCC) [F14.20] 06/06/2016  . Cannabis use disorder, severe, dependence (HCC) [F12.20] 06/06/2016  . MDD (major depressive disorder), recurrent severe, without psychosis (HCC) [F33.2] 06/03/2016  . Bipolar I disorder, most recent episode depressed (HCC) [F31.30] 07/04/2015  . Overdose [T50.901A] 07/02/2015  . Lactic acidosis [E87.2] 07/02/2015  . Acute respiratory failure (HCC) [J96.00] 07/02/2015  . Sepsis (HCC) [A41.9] 07/02/2015  . AKI (acute kidney injury) (HCC) [N17.9]   . Altered mental status [R41.82]   . Pyrexia [R50.9]   . Abdominal pain, epigastric [R10.13] 06/17/2015  . Hepatitis C antibody test positive [R76.8] 06/17/2015  . Peptic ulcer disease [K27.9] 06/17/2015  . Epigastric pain [R10.13]   . Transaminitis [R74.0] 06/16/2015  . History of substance abuse [Z87.898] 06/16/2015  . Bipolar affective disorder, current episode manic with psychotic symptoms (HCC) [F31.2] 03/03/2014    Total Time spent with patient: 30 minutes  Musculoskeletal: Strength & Muscle Tone: within normal limits Gait & Station: normal Patient leans: N/A  Psychiatric Specialty Exam: Review of Systems  Psychiatric/Behavioral: Positive for hallucinations (able to cope ) and substance abuse. Negative for depression and suicidal ideas.  All other systems reviewed and are negative.   Blood pressure 120/73, pulse (!) 118, temperature 98.5 F (36.9 C), temperature source Oral, resp. rate 18, height 5\' 2"  (1.575 m), weight 61 kg (134 lb 7.7 oz), last menstrual period 05/15/2016.Body mass  index is 24.6 kg/m.  General Appearance: Casual  Eye Contact::  Fair  Speech:  Clear and Coherent409  Volume:  Normal  Mood:  Euthymic  Affect:  Appropriate  Thought Process:  Goal Directed and Descriptions of Associations: Intact  Orientation:  Full (Time, Place, and Person)  Thought Content:  Logical  Suicidal Thoughts:  No  Homicidal Thoughts:  No  Memory:  Immediate;   Fair Recent;   Fair Remote;   Fair  Judgement:  Fair  Insight:  Fair  Psychomotor Activity:  Normal  Concentration:  Fair  Recall:  FiservFair  Fund of Knowledge:Fair  Language: Fair  Akathisia:  No  Handed:  Right  AIMS (if indicated):     Assets:  Communication Skills Desire for Improvement  Sleep:  Number of Hours: 4  Cognition: WNL  ADL's:  Intact   Mental Status Per Nursing Assessment::   On Admission:     Demographic Factors:  Caucasian  Loss Factors: NA  Historical Factors: Impulsivity  Risk Reduction Factors:   Positive social support and Positive therapeutic relationship  Continued Clinical Symptoms:  Alcohol/Substance Abuse/Dependencies Previous Psychiatric Diagnoses and Treatments  Cognitive Features That Contribute To Risk:  None    Suicide Risk:  Minimal: No identifiable suicidal ideation.  Patients presenting with no risk factors but with morbid ruminations; may be classified as minimal risk based on the severity of the depressive symptoms  Follow-up Information    [facility] Follow up on 06/08/2016.   Why:  You have been accepted for treatment on this date at 10:30AM. Please make sure that you have 21 day supply of all medications. Thank you.  Contact information: [address] [place], [place] [address] Phone: [phone] Fax: [phone]  Plan Of Care/Follow-up recommendations:  Activity:  no restrictions Diet:  regular Other:  none  [name], MD 06/08/2016, 9:30 AM

## 2016-10-07 ENCOUNTER — Encounter (HOSPITAL_COMMUNITY): Payer: Self-pay | Admitting: Emergency Medicine

## 2016-10-07 ENCOUNTER — Emergency Department (HOSPITAL_COMMUNITY): Admission: EM | Admit: 2016-10-07 | Discharge: 2016-10-09 | Payer: No Typology Code available for payment source

## 2016-10-07 DIAGNOSIS — Z79899 Other long term (current) drug therapy: Secondary | ICD-10-CM | POA: Insufficient documentation

## 2016-10-07 DIAGNOSIS — F1290 Cannabis use, unspecified, uncomplicated: Secondary | ICD-10-CM | POA: Insufficient documentation

## 2016-10-07 DIAGNOSIS — T50901A Poisoning by unspecified drugs, medicaments and biological substances, accidental (unintentional), initial encounter: Secondary | ICD-10-CM

## 2016-10-07 DIAGNOSIS — F1490 Cocaine use, unspecified, uncomplicated: Secondary | ICD-10-CM | POA: Insufficient documentation

## 2016-10-07 DIAGNOSIS — T401X1A Poisoning by heroin, accidental (unintentional), initial encounter: Secondary | ICD-10-CM | POA: Insufficient documentation

## 2016-10-07 DIAGNOSIS — F17210 Nicotine dependence, cigarettes, uncomplicated: Secondary | ICD-10-CM | POA: Insufficient documentation

## 2016-10-07 LAB — COMPREHENSIVE METABOLIC PANEL
ALT: 71 U/L — ABNORMAL HIGH (ref 14–54)
ANION GAP: 5 (ref 5–15)
AST: 68 U/L — ABNORMAL HIGH (ref 15–41)
Albumin: 3.2 g/dL — ABNORMAL LOW (ref 3.5–5.0)
Alkaline Phosphatase: 81 U/L (ref 38–126)
BUN: 7 mg/dL (ref 6–20)
CHLORIDE: 107 mmol/L (ref 101–111)
CO2: 26 mmol/L (ref 22–32)
Calcium: 8.2 mg/dL — ABNORMAL LOW (ref 8.9–10.3)
Creatinine, Ser: 0.61 mg/dL (ref 0.44–1.00)
GFR calc non Af Amer: >60 mL/min (ref >60)
Glucose, Bld: 118 mg/dL — ABNORMAL HIGH (ref 65–99)
Potassium: 3.6 mmol/L (ref 3.5–5.1)
SODIUM: 138 mmol/L (ref 135–145)
Total Bilirubin: 0.5 mg/dL (ref 0.3–1.2)
Total Protein: 6.2 g/dL — ABNORMAL LOW (ref 6.5–8.1)

## 2016-10-07 LAB — CBC WITH DIFFERENTIAL/PLATELET
Basophils Absolute: 0 10*3/uL (ref 0.0–0.1)
Basophils Relative: 0 %
EOS ABS: 0.3 10*3/uL (ref 0.0–0.7)
EOS PCT: 2 %
HCT: 34.3 % — ABNORMAL LOW (ref 36.0–46.0)
Hemoglobin: 11.5 g/dL — ABNORMAL LOW (ref 12.0–15.0)
LYMPHS ABS: 2.8 10*3/uL (ref 0.7–4.0)
Lymphocytes Relative: 27 %
MCH: 30.3 pg (ref 26.0–34.0)
MCHC: 33.5 g/dL (ref 30.0–36.0)
MCV: 90.5 fL (ref 78.0–100.0)
MONO ABS: 0.4 10*3/uL (ref 0.1–1.0)
MONOS PCT: 4 %
Neutro Abs: 7 10*3/uL (ref 1.7–7.7)
Neutrophils Relative %: 67 %
PLATELETS: 286 10*3/uL (ref 150–400)
RBC: 3.79 MIL/uL — ABNORMAL LOW (ref 3.87–5.11)
RDW: 14.1 % (ref 11.5–15.5)
WBC: 10.5 10*3/uL (ref 4.0–10.5)

## 2016-10-07 LAB — ETHANOL

## 2016-10-07 LAB — CBG MONITORING, ED: Glucose-Capillary: 91 mg/dL (ref 65–99)

## 2016-10-07 MED ORDER — NALOXONE HCL 0.4 MG/ML IJ SOLN
0.4000 mg | Freq: Once | INTRAMUSCULAR | Status: AC
  Administered 2016-10-07: 0.4 mg via INTRAVENOUS

## 2016-10-07 MED ORDER — NALOXONE HCL 0.4 MG/ML IJ SOLN
INTRAMUSCULAR | Status: AC
  Filled 2016-10-07: qty 1

## 2016-10-07 MED ORDER — SODIUM CHLORIDE 0.9 % IV BOLUS (SEPSIS)
1000.0000 mL | Freq: Once | INTRAVENOUS | Status: AC
  Administered 2016-10-07: 1000 mL via INTRAVENOUS

## 2016-10-07 MED ORDER — NALOXONE HCL 0.4 MG/ML IJ SOLN
0.4000 mg | Freq: Once | INTRAMUSCULAR | Status: AC
  Administered 2016-10-07: 0.4 mg via INTRAVENOUS
  Filled 2016-10-07: qty 1

## 2016-10-07 NOTE — BH Assessment (Addendum)
Tele Assessment Note   Becky [name] is an 26 y.o. female was brought into the [facility] tonight by EMS after a 911 call from her boyfriend stating she had possibly overdosed on heroin. Once in the ED, pt injected more heroin in the bathroom per EDP. Per EDP, pt became erratic and agitated after being delayed from being discharged and EDP decided to IVC pt. Pt denies SI, HI, SHI and AVH. Per pt record, pt has been previously diagnosed with Bipolar I D/O, PTSD, GAD and OCD. Pt was a poor historian due to sleepiness and irritability. Information for this assessment obtained from pt, EDP and pt record/hx. Pt denies SI and per hx, does not have a hx of SI. Pt denies AVH but, pt hx indicates that during other ED visits this year pt has been observed to be responding to internal stimuli and delusional with paranoia. Per pt record, pt has stated that she has AVH when she has "uppers" and/or cocaine. Pt sts she used today/tonight Heroin, Vicodin, Valium, Xanax, Cannabis and Cocaine. Per pt record, pt also has used methamphetamine and suboxone this year. Pt sts she does not drinks alcohol due to her ulcer.  Pt sts that she currently has a psychiatrist but could not remember the name. Per pt record, pt has been a pt at [facility] but had not been seen there in months. Pt sts she also has a therapist whose name she cannot remember.   Pt has been hospitalized psychiatrically or for drug treatment in the past year. Pt has been treated at [facility] and [facility] in 2018 and [facility] in 2017. Pt sts that she has been psychiatrically hospitalized multiple times but not further details are available. Pt has been previously daignosed with PTSD but no details are available. Per pt hx, pt's brother committed suicide and pt's brother, mother and father have substances use issues. Per pt hx, pt has stated that she has a hx of "violent arguments with her mom." Pt sts she does not have access to guns or weapons.   Pt was dressed in scrubs and lying on  her hospital bed sleeping or drowsey. Pt was drowsy or falling asleep throughout the assessment and had to be continually woken by a nurse at bedside. Pt was irritable and uncooperative. Pt kept poor eye contact, spoke in a slurred tone and at an excessively slow pace. Pt moved in a slow manner when moving. Pt's thought process was coherent and relevant when she answered questions. Judgement, insight and self-control was impaired.  No indication of delusional thinking or response to internal stimuli. Pt's mood was stated as depressed but not anxious and her blunted, irritable affect was congruent.  Pt was oriented x 3, to person, place and situation.   Diagnosis: Bipolar 1 D/O by hx; GAD by hx; PTSD by hx; OCG by hx; Opioid Use D/O, Severe; Cocaine Use D/O, Severe; Cannabis Use D/O, Severe; Sedative, Hypnotic, Anxiolytic Use D/O, Severe  Past Medical History:  Past Medical History:  Diagnosis Date  . Bipolar 1 disorder (HCC)   . Bronchitis   . GAD (generalized anxiety disorder)   . Hepatitis C antibody test positive 06/17/2015  . History of substance abuse 06/16/2015  . OCD (obsessive compulsive disorder)   . Peptic ulcer   . Polysubstance abuse   . PTSD (post-traumatic stress disorder)     Past Surgical History:  Procedure Laterality Date  . OTHER SURGICAL HISTORY     scar tissue removed from right ovary   .  OTHER SURGICAL HISTORY     fallopian tube repair  . WISDOM TOOTH EXTRACTION      Family History:  Family History  Problem Relation Age of Onset  . Drug abuse Brother   . Suicidality Brother   . Cancer Other   . Depression Mother   . Drug abuse Mother   . Alcoholism Father   . Bipolar disorder Cousin     Social History:  reports that she has been smoking Cigarettes.  She has a 10.00 pack-year smoking history. She has never used smokeless tobacco. She reports that she drinks alcohol. She reports that she uses drugs, including Marijuana, Methamphetamines, MDMA (Ecstacy),  Oxycodone, Benzodiazepines, and Cocaine.  Additional Social History:  Alcohol / Drug Use Prescriptions: SEE MAR- STS PRESCRIBED LATUDA History of alcohol / drug use?: Yes Longest period of sobriety (when/how long): UNKNOWN Substance #1 Name of Substance 1: OPIOIDS- HEROIN, VICODIN 1 - Age of First Use: UNK 1 - Amount (size/oz): UNK 1 - Frequency: DAILY 1 - Duration: ONGOING 1 - Last Use / Amount: 10/07/16 Substance #2 Name of Substance 2: VALIUM 2 - Age of First Use: UNK 2 - Amount (size/oz): UNK 2 - Frequency: DAILY 2 - Duration: ONGOING 2 - Last Use / Amount: 10/07/16 Substance #3 Name of Substance 3: CANNABIS 3 - Age of First Use: UNK 3 - Amount (size/oz): UNK 3 - Frequency: DAILY 3 - Duration: ONGOING 3 - Last Use / Amount: 10/07/16 Substance #4 Name of Substance 4: COCAINE 4 - Age of First Use: UNK 4 - Amount (size/oz): UNK 4 - Frequency: "EVERY COUPLE OF WEEKS" 4 - Duration: ONGOING 4 - Last Use / Amount: 10/07/16  CIWA: CIWA-Ar BP: 116/79 Pulse Rate: 87 COWS: Clinical Opiate Withdrawal Scale (COWS) Resting Pulse Rate: Pulse Rate 80 or below Sweating: No report of chills or flushing Restlessness: Able to sit still Bone or Joint Aches: Not present Runny Nose or Tearing: Not present GI Upset: No GI symptoms Tremor: No tremor Anxiety or Irritability: Patient obviously irritable/anxious Gooseflesh Skin: Skin is smooth  PATIENT STRENGTHS: (choose at least two) Average or above average intelligence Communication skills Supportive family/friends  Allergies:  Allergies  Allergen Reactions  . Azithromycin Anaphylaxis  . Nsaids Other (See Comments)    Peptic ulcers     Home Medications:  (Not in a hospital admission)  OB/GYN Status:  No LMP recorded.  General Assessment Data Location of Assessment: [facility] TTS Assessment: In system Is this a Tele or Face-to-Face Assessment?: Tele Assessment Is this an Initial Assessment or a Re-assessment for this  encounter?: Initial Assessment Marital status: Long term relationship Is patient pregnant?: Unknown Pregnancy Status: Unknown Living Arrangements: Spouse/significant other Can pt return to current living arrangement?: Yes Admission Status: Involuntary Is patient capable of signing voluntary admission?: Yes Referral Source: Self/Family/Friend Insurance type:  (NONE)     Crisis Care Plan Living Arrangements: Spouse/significant other Name of Psychiatrist:  (PT STS HAS A DR BUT5 CANNOT REMEMBER NAME-[facility] PER HX) Name of Therapist:  (STS HAS A THERAPIST BUT CANNOT REMEMBER NAME)  Education Status Is patient currently in school?:  (UTA) Highest grade of school patient has completed:  (UTA)  Risk to self with the past 6 months Suicidal Ideation: No (DENIES) Has patient been a risk to self within the past 6 months prior to admission? : Yes (DRUG USE-NO SI NOTED) Suicidal Intent: No Has patient had any suicidal intent within the past 6 months prior to admission? : No Is patient  at risk for suicide?: No Suicidal Plan?: No (DENIES) Has patient had any suicidal plan within the past 6 months prior to admission? : No (PER PT RECORD/HX) Access to Means: No (STS NO ACCESS TO GUNS, WEAPONS) What has been your use of drugs/alcohol within the last 12 months?:  (DAILY USE OF MULTIPLE SUBSTANCES) Previous Attempts/Gestures: No (DENIES) How many times?:  (0) Other Self Harm Risks:  (POLYSUBSTANCE OD-STS NOT INTENTIONAL OD) Triggers for Past Attempts: Unpredictable Intentional Self Injurious Behavior: None Family Suicide History: Yes (PER HX, BROTHER) Recent stressful life event(s): Financial Problems (PER HX) Persecutory voices/beliefs?:  (UTA) Depression: Yes Depression Symptoms: Fatigue, Feeling worthless/self pity, Feeling angry/irritable Substance abuse history and/or treatment for substance abuse?: Yes Suicide prevention information given to non-admitted patients: Not applicable  Risk  to Others within the past 6 months Homicidal Ideation: No (DENIES) Does patient have any lifetime risk of violence toward others beyond the six months prior to admission? :  (PER HX, "VIOLENT ARGUEMENTS WITH HER MOM") Thoughts of Harm to Others: No (DENIES) Current Homicidal Intent: No Current Homicidal Plan: No Access to Homicidal Means: No Identified Victim:  (NONE REPORTED) History of harm to others?: No Assessment of Violence: In distant past Violent Behavior Description:  ("VIOLENT ARGUEMENTS WITH MOM" PER PT RECORD) Does patient have access to weapons?: No (DENIES) Criminal Charges Pending?:  (UTA) Does patient have a court date:  (UTA) Is patient on probation?:  (UTA)  Psychosis Hallucinations: None noted (DENIES) Delusions: None noted  Mental Status Report Appearance/Hygiene: Disheveled Eye Contact: Poor Motor Activity: Freedom of movement Speech: Logical/coherent, Soft, Slurred Level of Consciousness: Restless, Drowsy, Irritable Mood: Depressed, Angry Affect: Angry, Blunted, Depressed Anxiety Level: None Thought Processes: Coherent, Relevant Judgement: Impaired Orientation: Person, Place, Situation Obsessive Compulsive Thoughts/Behaviors: Unable to Assess  Cognitive Functioning Concentration: Decreased Memory: Recent Impaired, Remote Impaired IQ: Average Insight: Poor Impulse Control: Poor Appetite:  (UTA) Sleep:  (UTA) Vegetative Symptoms: Unable to Assess  ADLScreening [facility] Assessment Services) Patient's cognitive ability adequate to safely complete daily activities?: Yes Patient able to express need for assistance with ADLs?: Yes Independently performs ADLs?: Yes (appropriate for developmental age) (NO BARRIERS REPORTED)  Prior Inpatient Therapy Prior Inpatient Therapy: Yes Prior Therapy Dates:  (2017, 2018) Prior Therapy Facilty/Provider(s):  ([facility] PER HX) Reason for Treatment:  (POLYSUBSTANCES ABUSE/DEP)  Prior Outpatient Therapy Prior  Outpatient Therapy: Yes Prior Therapy Dates:  Loreli Slot) Prior Therapy Facilty/Provider(s):  (PT COULD NOT NAME-[facility] PER HX) Reason for Treatment:  (SA, BIPOLAR) Does patient have an ACCT team?: No Does patient have Intensive In-House Services?  : No Does patient have [facility] services? : No Does patient have P4CC services?: No  ADL Screening (condition at time of admission) Patient's cognitive ability adequate to safely complete daily activities?: Yes Patient able to express need for assistance with ADLs?: Yes Independently performs ADLs?: Yes (appropriate for developmental age) (NO BARRIERS REPORTED)       Abuse/Neglect Assessment (Assessment to be complete while patient is alone) Physical Abuse:  (UTA) Verbal Abuse:  (UTA) Sexual Abuse:  (UTA) Exploitation of patient/patient's resources:  Industrial/product designer) Self-Neglect:  (UTA)     Advance Directives (For Healthcare) Does Patient Have a Medical Advance Directive?:  (UTA) Would patient like information on creating a medical advance directive?: No - Patient declined    Additional Information 1:1 In Past 12 Months?: No CIRT Risk: Yes Elopement Risk: Yes Does patient have medical clearance?: Yes     Disposition:  Disposition Initial Assessment Completed for this  Encounter: Yes Disposition of Patient: Other dispositions Other disposition(s): Other (Comment) (PENDING REVIEW W [facility] EXTENDER)  Per Nira ConnJason Berry, NP- Recommend monitoring overnight & re-evaluate in the morning.  Spoke with Dr. Adriana Simasook, EDP at [facility] and advised of recommendation. He stated he agreed.   Beryle FlockMary Lindley Hiney, MS, CRC, [facility] Triage Specialist [facility] Tan[facility]ah Boulter T 10/07/2016 11:13 PM

## 2016-10-07 NOTE — ED Notes (Signed)
Pt given Narcan for unresponsiveness. Pt awake and wanting to go home. Agitated that she cannot leave. Explained to pt that she had to stay awake and alert in order to be discharged.

## 2016-10-07 NOTE — ED Notes (Addendum)
This nurse went to speak to the patient who was hollering out, upset that she couldn't leave.  The patient admitted that when she went to the bathroom after she first arrived here, "I did some more heroin"  And the patient handed me a syringe that she had hidden in her bra.  There was no needle on the syringe and the patient states "I didn't shoot it, I snorted it"    Dr Adriana Simasook made aware of same and is speaking with the patient's "boyfriend".   Addendum:  After [employer] Officer looked at the syringe more closely, there WAS noted to be an insulin needle on the syringe that was bent over made to look like no needle was present.

## 2016-10-07 NOTE — Discharge Instructions (Addendum)
Recommend counseling for your addiction problem.  Phone number given.

## 2016-10-07 NOTE — ED Triage Notes (Signed)
Pt reports using $50.00 worth of heroin about an hour ago.  EMS reports pt's boyfriend called 911.  Pt lethargic, but arouse able on arrival.

## 2016-10-07 NOTE — ED Provider Notes (Addendum)
[facility] Provider Note   CSN: [id number] Arrival date & time: 10/07/16  1725     History   Chief Complaint Chief Complaint  Patient presents with  . Drug Overdose    HPI Becky [name] is a 26 y.o. female.  Level V caveat for urgent need for intervention. Patient allegedly injected heroin a brief time ago. She was found by her significant other acting lethargically.  She has a history of intravenous drug abuse, polysubstance abuse, bipolar, PTSD. She was able to ambulate to the ambulance. No Narcan was administered.      Past Medical History:  Diagnosis Date  . Bipolar 1 disorder (HCC)   . Bronchitis   . GAD (generalized anxiety disorder)   . Hepatitis C antibody test positive 06/17/2015  . History of substance abuse 06/16/2015  . OCD (obsessive compulsive disorder)   . Peptic ulcer   . Polysubstance abuse   . PTSD (post-traumatic stress disorder)     Patient Active Problem List   Diagnosis Date Noted  . Opioid use disorder, moderate, dependence (HCC) 06/06/2016  . 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 Cocaine use disorder, severe, dependence (HCC) 06/06/2016  . Cannabis use disorder, severe, dependence (HCC) 06/06/2016  . 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 Bipolar I disorder, most recent episode depressed (HCC) 07/04/2015  . Overdose 07/02/2015  . Lactic acidosis 07/02/2015  . Acute respiratory failure (HCC) 07/02/2015  . Sepsis (HCC) 07/02/2015  . AKI (acute kidney injury) (HCC)   . Altered mental status   . Pyrexia   . Abdominal pain, epigastric 06/17/2015  . Hepatitis C antibody test positive 06/17/2015  . Peptic ulcer disease 06/17/2015  . Epigastric pain   . Transaminitis 06/16/2015  . History of substance abuse 06/16/2015  . Bipolar affective disorder, current episode manic with psychotic symptoms (HCC) 03/03/2014    Past Surgical  History:  Procedure Laterality Date  . OTHER SURGICAL HISTORY     scar tissue removed from right ovary   . OTHER SURGICAL HISTORY     fallopian tube repair  . WISDOM TOOTH EXTRACTION      OB History    Gravida Para Term Preterm AB Living   2       1 1    SAB TAB Ectopic Multiple Live Births                   Home Medications    Prior to Admission medications   Medication Sig Start Date End Date Taking? Authorizing Provider  benztropine (COGENTIN) 1 MG tablet Take 1 tablet (1 mg total) by mouth at bedtime. Patient not taking: Reported on 10/07/2016 06/08/16   Adonis Brook, NP  carbamazepine (TEGRETOL) 200 MG tablet Take 200 mg by mouth 3 (three) times daily. 09/07/16   [provider]  citalopram (CELEXA) 20 MG tablet Take 20 mg by mouth daily. 09/23/16   [provider]  doxepin (SINEQUAN) 25 MG capsule Take 1 capsule (25 mg total) by mouth at bedtime. Patient not taking: Reported on 10/07/2016 06/08/16   Adonis Brook, NP  gabapentin (NEURONTIN) 300 MG capsule Take 2 capsules (600 mg total) by mouth 3 (three) times daily. Patient not taking: Reported on 10/07/2016 06/08/16   Adonis Brook, NP  hydrOXYzine (ATARAX/VISTARIL) 50 MG tablet Take 1 tablet (50 mg total) by mouth every 6 (six) hours as needed for anxiety (CIWA LESS THAN 10). Patient not taking: Reported on 10/07/2016  06/08/16   Adonis Brook, NP  levothyroxine (SYNTHROID, LEVOTHROID) 50 MCG tablet Take 1 tablet (50 mcg total) by mouth every morning. Patient not taking: Reported on 10/07/2016 06/08/16   Adonis Brook, NP  mirtazapine (REMERON) 30 MG tablet Take 1 tablet (30 mg total) by mouth at bedtime. Patient not taking: Reported on 10/07/2016 06/08/16   Adonis Brook, NP  nicotine polacrilex (NICORETTE) 2 MG gum Take 1 each (2 mg total) by mouth as needed for smoking cessation. Patient not taking: Reported on 10/07/2016 06/08/16   Adonis Brook, NP  pantoprazole (PROTONIX) 20 MG tablet Take 1 tablet  (20 mg total) by mouth daily. Patient not taking: Reported on 10/07/2016 06/09/16   Adonis Brook, NP  QUEtiapine (SEROQUEL) 300 MG tablet Take 1 tablet (300 mg total) by mouth 2 (two) times daily at 7 am and 12 noon. Patient not taking: Reported on 10/07/2016 06/08/16   Adonis Brook, NP  QUEtiapine (SEROQUEL) 400 MG tablet Take 1 tablet (400 mg total) by mouth at bedtime. Patient not taking: Reported on 10/07/2016 06/08/16   Adonis Brook, NP    Family History Family History  Problem Relation Age of Onset  . Drug abuse Brother   . Suicidality Brother   . Cancer Other   . Depression Mother   . Drug abuse Mother   . Alcoholism Father   . Bipolar disorder Cousin     Social History Social History  Substance Use Topics  . Smoking status: Current Every Day Smoker    Packs/day: 1.00    Years: 10.00    Types: Cigarettes  . Smokeless tobacco: Never Used  . Alcohol use Yes     Comment: occ     Allergies   Azithromycin and Nsaids   Review of Systems Review of Systems  Reason unable to perform ROS: Urgent need for intervention.     Physical Exam Updated Vital Signs BP 116/79   Pulse 87   Temp 97.6 F (36.4 C) (Oral)   Resp 17   Ht 5\' 7"  (1.702 m)   Wt 68 kg (150 lb)   SpO2 99%   BMI 23.49 kg/m   Physical Exam  Constitutional:  Lethargic  HENT:  Head: Normocephalic and atraumatic.  Eyes: Conjunctivae are normal.  Neck: Neck supple.  Cardiovascular: Normal rate and regular rhythm.   Pulmonary/Chest: Effort normal and breath sounds normal.  Abdominal: Soft. Bowel sounds are normal.  Musculoskeletal:  Unable  Neurological:  Lethargic  Skin: Skin is warm and dry.  Psychiatric:  Flat affect  Nursing note and vitals reviewed.    ED Treatments / Results  Labs (all labs ordered are listed, but only abnormal results are displayed) Labs Reviewed  CBC WITH DIFFERENTIAL/PLATELET - Abnormal; Notable for the following:       Result Value   RBC 3.79 (*)     Hemoglobin 11.5 (*)    HCT 34.3 (*)    All other components within normal limits  COMPREHENSIVE METABOLIC PANEL - Abnormal; Notable for the following:    Glucose, Bld 118 (*)    Calcium 8.2 (*)    Total Protein 6.2 (*)    Albumin 3.2 (*)    AST 68 (*)    ALT 71 (*)    All other components within normal limits  ETHANOL  RAPID URINE DRUG SCREEN, HOSP PERFORMED  CBG MONITORING, ED    EKG  EKG Interpretation  Date/Time:  Friday Oct 07 2016 17:26:55 EDT Ventricular Rate:  63 PR Interval:  QRS Duration: 96 QT Interval:  443 QTC Calculation: 454 R Axis:   99 Text Interpretation:  Sinus rhythm Short PR interval Borderline right axis deviation Baseline wander in lead(s) V2 Confirmed by Loany Neuroth  MD, Tayjon Halladay ([id number]) on 10/07/2016 6:52:55 PM       Radiology No results found.  Procedures Procedures (including critical care time)  Medications Ordered in ED Medications  naloxone [facility](NARCAN) injection 0.4 mg (0.4 mg Intravenous Given 10/07/16 1733)  sodium chloride 0.9 % bolus 1,000 mL (0 mLs Intravenous Stopped 10/07/16 1910)  naloxone [facility](NARCAN) injection 0.4 mg (0.4 mg Intravenous Given 10/07/16 1908)  naloxone [facility](NARCAN) injection 0.4 mg (0.4 mg Intravenous Given 10/07/16 2120)  naloxone [facility](NARCAN) injection 0.4 mg (0.4 mg Intravenous Given 10/07/16 2239)     Initial Impression / Assessment and Plan / ED Course  I have reviewed the triage vital signs and the nursing notes.  Pertinent labs & imaging results that were available during my care of the patient were reviewed by me and considered in my medical decision making (see chart for details).    CRITICAL CARE Performed by: Donnetta HutchingOOK,Ferrell Flam  ?  Total critical care time: 30 minutes  Critical care time was exclusive of separately billable procedures and treating other patients.  Critical care was necessary to treat or prevent imminent or life-threatening deterioration.  Critical care was time spent personally by me on the following  activities: development of treatment plan with patient and/or surrogate as well as nursing, discussions with consultants, evaluation of patient's response to treatment, examination of patient, obtaining history from patient or surrogate, ordering and performing treatments and interventions, ordering and review of laboratory studies, ordering and review of radiographic studies, pulse oximetry and re-evaluation of patient's condition.   Patient was obviously impaired upon original presentation. She was very lethargic. Her airway was protected.  Glucose was within normal limits. Narcan 0.4 mg was administered. Patient became more alert. I discussed with her and her significant other the seriousness of her condition.   2045:  Patient apparently shot up heroin in the restroom. She was belligerent and acting erratically. I decided to involuntarily commit her secondary to potential "harmful to self" concept.  2345:  Discussed with Dr. Blinda LeatherwoodPollina at change of shift  Final Clinical Impressions(s) / ED Diagnoses   Final diagnoses:  Accidental drug overdose, initial encounter    New Prescriptions New Prescriptions   No medications on file     Donnetta Hutchingook, Verner Kopischke, MD 10/07/16 Emelda [id number]    Donnetta Hutchingook, Jinger Middlesworth, MD 10/07/16 2322

## 2016-10-07 NOTE — ED Notes (Signed)
Pt very drowsy. Opens eyes to verbal stimuli but is unable to leave them open. TTS in process but having difficulty obtaining information from pt d/t drowsiness. Dr. Adriana Simasook notified and orders given for Narcan to be given. Narcan administered as ordered but had little effect on pt. Will continue to monitor.

## 2016-10-08 DIAGNOSIS — Z811 Family history of alcohol abuse and dependence: Secondary | ICD-10-CM | POA: Diagnosis not present

## 2016-10-08 DIAGNOSIS — Z818 Family history of other mental and behavioral disorders: Secondary | ICD-10-CM | POA: Diagnosis not present

## 2016-10-08 DIAGNOSIS — F1290 Cannabis use, unspecified, uncomplicated: Secondary | ICD-10-CM

## 2016-10-08 DIAGNOSIS — T401X4A Poisoning by heroin, undetermined, initial encounter: Secondary | ICD-10-CM

## 2016-10-08 DIAGNOSIS — Z813 Family history of other psychoactive substance abuse and dependence: Secondary | ICD-10-CM

## 2016-10-08 DIAGNOSIS — F1190 Opioid use, unspecified, uncomplicated: Secondary | ICD-10-CM

## 2016-10-08 DIAGNOSIS — F1590 Other stimulant use, unspecified, uncomplicated: Secondary | ICD-10-CM

## 2016-10-08 DIAGNOSIS — F1390 Sedative, hypnotic, or anxiolytic use, unspecified, uncomplicated: Secondary | ICD-10-CM

## 2016-10-08 DIAGNOSIS — F1490 Cocaine use, unspecified, uncomplicated: Secondary | ICD-10-CM

## 2016-10-08 DIAGNOSIS — F17210 Nicotine dependence, cigarettes, uncomplicated: Secondary | ICD-10-CM

## 2016-10-08 LAB — RAPID URINE DRUG SCREEN, HOSP PERFORMED
AMPHETAMINES: NOT DETECTED
BENZODIAZEPINES: POSITIVE — AB
Barbiturates: NOT DETECTED
Cocaine: POSITIVE — AB
Opiates: POSITIVE — AB
TETRAHYDROCANNABINOL: POSITIVE — AB

## 2016-10-08 MED ORDER — LOPERAMIDE HCL 2 MG PO CAPS
2.0000 mg | ORAL_CAPSULE | ORAL | Status: DC | PRN
  Administered 2016-10-09: 2 mg via ORAL
  Filled 2016-10-08: qty 1

## 2016-10-08 MED ORDER — ONDANSETRON HCL 4 MG PO TABS
4.0000 mg | ORAL_TABLET | Freq: Three times a day (TID) | ORAL | Status: DC | PRN
  Administered 2016-10-09: 4 mg via ORAL
  Filled 2016-10-08: qty 1

## 2016-10-08 MED ORDER — THIAMINE HCL 100 MG/ML IJ SOLN: 100.0000 mg | Freq: Every day | INTRAMUSCULAR | Status: DC

## 2016-10-08 MED ORDER — PANTOPRAZOLE SODIUM 40 MG PO TBEC
40.0000 mg | DELAYED_RELEASE_TABLET | Freq: Every day | ORAL | Status: DC
Start: 2016-10-08 — End: 2016-10-09
  Administered 2016-10-08: 40 mg via ORAL
  Filled 2016-10-08: qty 1

## 2016-10-08 MED ORDER — LORAZEPAM 1 MG PO TABS: 0.0000 mg | ORAL_TABLET | Freq: Two times a day (BID) | ORAL | Status: DC

## 2016-10-08 MED ORDER — QUETIAPINE FUMARATE 100 MG PO TABS
400.0000 mg | ORAL_TABLET | Freq: Every day | ORAL | Status: DC
  Administered 2016-10-08: 400 mg via ORAL
  Filled 2016-10-08: qty 4

## 2016-10-08 MED ORDER — ACETAMINOPHEN 325 MG PO TABS: 650.0000 mg | ORAL_TABLET | ORAL | Status: DC | PRN

## 2016-10-08 MED ORDER — BENZTROPINE MESYLATE 1 MG PO TABS
1.0000 mg | ORAL_TABLET | Freq: Every day | ORAL | Status: DC
  Administered 2016-10-08: 1 mg via ORAL
  Filled 2016-10-08: qty 1

## 2016-10-08 MED ORDER — DICYCLOMINE HCL 10 MG PO CAPS
20.0000 mg | ORAL_CAPSULE | Freq: Four times a day (QID) | ORAL | Status: DC | PRN
  Administered 2016-10-08 – 2016-10-09 (×4): 20 mg via ORAL
  Filled 2016-10-08 (×3): qty 2

## 2016-10-08 MED ORDER — CLONIDINE HCL 0.1 MG PO TABS
0.1000 mg | ORAL_TABLET | Freq: Four times a day (QID) | ORAL | Status: DC
  Administered 2016-10-08 – 2016-10-09 (×5): 0.1 mg via ORAL
  Filled 2016-10-08 (×5): qty 1

## 2016-10-08 MED ORDER — CARBAMAZEPINE 200 MG PO TABS
200.0000 mg | ORAL_TABLET | Freq: Three times a day (TID) | ORAL | Status: DC
  Administered 2016-10-08 – 2016-10-09 (×4): 200 mg via ORAL
  Filled 2016-10-08 (×5): qty 1

## 2016-10-08 MED ORDER — LORAZEPAM 2 MG/ML IJ SOLN: 0.0000 mg | Freq: Four times a day (QID) | INTRAMUSCULAR | Status: DC

## 2016-10-08 MED ORDER — MIRTAZAPINE 30 MG PO TABS
30.0000 mg | ORAL_TABLET | Freq: Every day | ORAL | Status: DC
  Administered 2016-10-08: 30 mg via ORAL
  Filled 2016-10-08 (×2): qty 1

## 2016-10-08 MED ORDER — MIRTAZAPINE 30 MG PO TABS
ORAL_TABLET | ORAL | Status: AC
  Filled 2016-10-08: qty 1

## 2016-10-08 MED ORDER — METHOCARBAMOL 500 MG PO TABS
500.0000 mg | ORAL_TABLET | Freq: Three times a day (TID) | ORAL | Status: DC | PRN
  Administered 2016-10-08 – 2016-10-09 (×3): 500 mg via ORAL
  Filled 2016-10-08 (×3): qty 1

## 2016-10-08 MED ORDER — QUETIAPINE FUMARATE 100 MG PO TABS
300.0000 mg | ORAL_TABLET | ORAL | Status: DC
  Administered 2016-10-08 – 2016-10-09 (×2): 300 mg via ORAL
  Filled 2016-10-08 (×2): qty 3

## 2016-10-08 MED ORDER — CLONIDINE HCL 0.1 MG PO TABS: 0.1000 mg | ORAL_TABLET | Freq: Every day | ORAL | Status: DC

## 2016-10-08 MED ORDER — NICOTINE 21 MG/24HR TD PT24
21.0000 mg | MEDICATED_PATCH | Freq: Every day | TRANSDERMAL | Status: DC
  Administered 2016-10-08: 21 mg via TRANSDERMAL
  Filled 2016-10-08: qty 1

## 2016-10-08 MED ORDER — LORAZEPAM 2 MG/ML IJ SOLN: 0.0000 mg | Freq: Two times a day (BID) | INTRAMUSCULAR | Status: DC

## 2016-10-08 MED ORDER — VITAMIN B-1 100 MG PO TABS
100.0000 mg | ORAL_TABLET | Freq: Every day | ORAL | Status: DC
  Administered 2016-10-08 – 2016-10-09 (×2): 100 mg via ORAL
  Filled 2016-10-08 (×2): qty 1

## 2016-10-08 MED ORDER — LEVOTHYROXINE SODIUM 50 MCG PO TABS
50.0000 ug | ORAL_TABLET | Freq: Every morning | ORAL | Status: DC
  Administered 2016-10-08: 50 ug via ORAL
  Filled 2016-10-08: qty 1

## 2016-10-08 MED ORDER — CLONIDINE HCL 0.1 MG PO TABS: 0.1000 mg | ORAL_TABLET | Freq: Two times a day (BID) | ORAL | Status: DC

## 2016-10-08 MED ORDER — LORAZEPAM 1 MG PO TABS
0.0000 mg | ORAL_TABLET | Freq: Four times a day (QID) | ORAL | Status: DC
  Administered 2016-10-08: 1 mg via ORAL
  Filled 2016-10-08 (×2): qty 1

## 2016-10-08 MED ORDER — HYDROXYZINE HCL 25 MG PO TABS
50.0000 mg | ORAL_TABLET | Freq: Four times a day (QID) | ORAL | Status: DC | PRN
Start: 2016-10-08 — End: 2016-10-09
  Administered 2016-10-09: 50 mg via ORAL
  Filled 2016-10-08: qty 2

## 2016-10-08 MED ORDER — GABAPENTIN 300 MG PO CAPS
600.0000 mg | ORAL_CAPSULE | Freq: Three times a day (TID) | ORAL | Status: DC
Start: 2016-10-08 — End: 2016-10-09
  Administered 2016-10-08 – 2016-10-09 (×4): 600 mg via ORAL
  Filled 2016-10-08 (×4): qty 2

## 2016-10-08 NOTE — Consult Note (Signed)
Telepsych Consultation   Reason for Consult:  Accidental drug overdose Referring Physician:  EDP Patient Identification: Becky [name] MRN:  [id number] Principal Diagnosis: <principal problem not specified> Diagnosis:   Patient Active Problem List   Diagnosis Date Noted  . Bipolar affective disorder, current episode manic with psychotic symptoms ([place]) [F31.2] 03/03/2014    Priority: High  . Opioid use disorder, moderate, dependence ([place]) [F11.20] 06/06/2016  . Sedative, hypnotic or anxiolytic use disorder, severe, dependence ([place]) [F13.20] 06/06/2016  . Cocaine use disorder, severe, dependence ([place]) [F14.20] 06/06/2016  . Cannabis use disorder, severe, dependence ([place]) [F12.20] 06/06/2016  . MDD (major depressive disorder), recurrent severe, without psychosis ([place]) [F33.2] 06/03/2016  . Bipolar I disorder, most recent episode depressed ([place]) [F31.30] 07/04/2015  . Overdose [T50.901A] 07/02/2015  . Lactic acidosis [E87.2] 07/02/2015  . Acute respiratory failure ([place]) [J96.00] 07/02/2015  . Sepsis ([place]) [A41.9] 07/02/2015  . AKI (acute kidney injury) ([place]) [N17.9]   . Altered mental status [R41.82]   . Pyrexia [R50.9]   . Abdominal pain, epigastric [R10.13] 06/17/2015  . Hepatitis C antibody test positive [R76.8] 06/17/2015  . Peptic ulcer disease [K27.9] 06/17/2015  . Epigastric pain [R10.13]   . Transaminitis [R74.0] 06/16/2015  . History of substance abuse [Z87.898] 06/16/2015    Total Time spent with patient: 30 minutes  Subjective:   Becky [name] is a 26 y.o. female patient admitted with heroin overdose.  HPI:  Per note on chart written by Faylene Kurtz, [facility] Counselor:  Becky [name] is an 26 y.o. female was [facility] into the [facility] tonight by EMS after a 911 call from her boyfriend stating she had possibly overdosed on heroin. Once in the ED, pt injected more heroin in the bathroom per EDP. Per EDP, pt became erratic and agitated after being delayed from being  discharged and EDP decided to IVC pt. Pt denies SI, HI, SHI and AVH. Per pt record, pt has been previously diagnosed with Bipolar I D/O, PTSD, GAD and OCD. Pt was a poor historian due to sleepiness and irritability. Information for this assessment obtained from pt, EDP and pt record/hx. Pt denies SI and per hx, does not have a hx of SI. Pt denies AVH but, pt hx indicates that during other ED visits this year pt has been observed to be responding to internal stimuli and delusional with paranoia. Per pt record, pt has stated that she has AVH when she has "uppers" and/or cocaine. Pt sts she used today/tonight Heroin, Vicodin, Valium, Xanax, Cannabis and Cocaine. Per pt record, pt also has used methamphetamine and suboxone this year. Pt sts she does not drinks alcohol due to her ulcer.  Pt sts that she currently has a psychiatrist but could not remember the name. Per pt record, pt has been a pt at [facility] but had not been seen there in months. Pt sts she also has a therapist whose name she cannot remember.   Pt has been hospitalized psychiatrically or for drug treatment in the past year. Pt has been [facility] at [facility] and [facility] in 2017. Pt sts that she has been psychiatrically hospitalized multiple times but not further details are available. Pt has been previously daignosed with PTSD but no details are available. Per pt hx, pt's brother committed suicide and pt's brother, mother and father have substances use issues. Per pt hx, pt has stated that she has a hx of "violent arguments with her mom." Pt sts she does not have access to guns or weapons.  Pt was dressed in scrubs and lying on her hospital bed sleeping or drowsey. Pt was drowsy or falling asleep throughout the assessment and had to be continually woken by a nurse at bedside. Pt was irritable and uncooperative. Pt kept poor eye contact, spoke in a slurred tone and at an excessively slow pace. Pt moved in a slow manner when moving. Pt's thought  process was coherent and relevant when she answered questions. Judgement, insight and self-control was impaired.  No indication of delusional thinking or response to internal stimuli. Pt's mood was stated as depressed but not anxious and her blunted, irritable affect was congruent.  Pt was oriented x 3, to person, place and situation.   Diagnosis: Bipolar 1 D/O by hx; GAD by hx; PTSD by hx; OCG by hx; Opioid Use D/O, Severe; Cocaine Use D/O, Severe; Cannabis Use D/O, Severe; Sedative, Hypnotic, Anxiolytic Use D/O, Severe  Today during tele psych consult:  I have reviewed and concur with HPI elements above, modified as follows:   Becky [name] is a 26 year old female who presented to the [facility]  via EMS after overdosing on heroin. Pt denies that this was a suicide attempt but once she got to the hospital she ambulated to the bathroom and injected more Heroin. Pt denies suicidal ideation, homicidal ideation and admitted auditory and visual hallucinations when she is "high on uppers." Pt was inpatient at [facility] in January 2018 for 5 days and has had multiple ED admissions  in the past 11/2 years. Pt was extremely sleepy and  kept nodding off during assessment. Pt was able to answer questions but her speech was slow and slurred. Pt has been observed responding to internal stimuli in the past. Pt has been off her anti-psychotics for several months and stated that she lost her insurance and could not afford them. Pt has been seen in the past by [facility] but stated she has not been there in several months.   Pt's risk factors are severe polysubstance abuse, Bipolar 1 disorder, MDD, and injecting heroin in the emergency room bathroom. Pt's brother committed suicide and mother, father, and brother have substance abuse issues. Pt stated she lives with her mother and her aunt but was at her boyfriend's house when she overdosed and he called 911. Pt would benefit from inpatient psychiatric admission for crisis  stabilization and treatment of her many mental health issues.   Past Psychiatric History: Bipolar 1 disorder, MDD, PTSD, GAD, Severe Polysubstance Abuse  Risk to Self: Suicidal Ideation: No (DENIES) Suicidal Intent: No Is patient at risk for suicide?: No Suicidal Plan?: No (DENIES) Access to Means: No (STS NO ACCESS TO GUNS, WEAPONS) What has been your use of drugs/alcohol within the last 12 months?:  (DAILY USE OF MULTIPLE SUBSTANCES) How many times?:  (0) Other Self Harm Risks:  (POLYSUBSTANCE OD-STS NOT INTENTIONAL OD) Triggers for Past Attempts: Unpredictable Intentional Self Injurious Behavior: None Risk to Others: Homicidal Ideation: No (DENIES) Thoughts of Harm to Others: No (DENIES) Current Homicidal Intent: No Current Homicidal Plan: No Access to Homicidal Means: No Identified Victim:  (NONE REPORTED) History of harm to others?: No Assessment of Violence: In distant past Violent Behavior Description:  ("VIOLENT ARGUEMENTS WITH MOM" PER PT RECORD) Does patient have access to weapons?: No (DENIES) Criminal Charges Pending?:  (UTA) Does patient have a court date:  (UTA) Prior Inpatient Therapy: Prior Inpatient Therapy: Yes Prior Therapy Dates:  (2017, 2018) Prior Therapy Facilty/Provider(s):  ([facility] PER HX) Reason for  Treatment:  (POLYSUBSTANCES ABUSE/DEP) Prior Outpatient Therapy: Prior Outpatient Therapy: Yes Prior Therapy Dates:  Tomasita Crumble) Prior Therapy Facilty/Provider(s):  (PT COULD NOT NAME-[facility] PER HX) Reason for Treatment:  (SA, BIPOLAR) Does patient have an ACCT team?: No Does patient have Intensive In-House Services?  : No Does patient have [facility] services? : No Does patient have P4CC services?: No  Past Medical History:  Past Medical History:  Diagnosis Date  . Bipolar 1 disorder ([place])   . Bronchitis   . GAD (generalized anxiety disorder)   . Hepatitis C antibody test positive 06/17/2015  . History of substance abuse 06/16/2015  . OCD (obsessive  compulsive disorder)   . Peptic ulcer   . Polysubstance abuse   . PTSD (post-traumatic stress disorder)     Past Surgical History:  Procedure Laterality Date  . OTHER SURGICAL HISTORY     scar tissue removed from right ovary   . OTHER SURGICAL HISTORY     fallopian tube repair  . WISDOM TOOTH EXTRACTION     Family History:  Family History  Problem Relation Age of Onset  . Drug abuse Brother   . Suicidality Brother   . Cancer Other   . Depression Mother   . Drug abuse Mother   . Alcoholism Father   . Bipolar disorder Cousin    Family Psychiatric  History: Substance abuse issues and sibling committed suicide Social History:  History  Alcohol Use  . Yes    Comment: occ     History  Drug Use  . Types: Marijuana, Methamphetamines, MDMA (Ecstacy), Oxycodone, Benzodiazepines, Cocaine    Social History   Social History  . Marital status: Legally Separated    Spouse name: N/A  . Number of children: N/A  . Years of education: N/A   Social History Main Topics  . Smoking status: Current Every Day Smoker    Packs/day: 1.00    Years: 10.00    Types: Cigarettes  . Smokeless tobacco: Never Used  . Alcohol use Yes     Comment: occ  . Drug use: Yes    Types: Marijuana, Methamphetamines, MDMA (Ecstacy), Oxycodone, Benzodiazepines, Cocaine  . Sexual activity: Yes    Birth control/ protection: None   Other Topics Concern  . None   Social History Narrative  . None   Additional Social History:    Allergies:   Allergies  Allergen Reactions  . Azithromycin Anaphylaxis  . Nsaids Other (See Comments)    Peptic ulcers     Labs:  Results for orders placed or performed during the hospital encounter of 10/07/16 (from the past 48 hour(s))  POC CBG, ED     Status: None   Collection Time: 10/07/16  5:32 PM  Result Value Ref Range   Glucose-Capillary 91 65 - 99 mg/dL  CBC with Differential     Status: Abnormal   Collection Time: 10/07/16  8:34 PM  Result Value Ref Range    WBC 10.5 4.0 - 10.5 K/uL   RBC 3.79 (L) 3.87 - 5.11 MIL/uL   Hemoglobin 11.5 (L) 12.0 - 15.0 g/dL   HCT 34.3 (L) 36.0 - 46.0 %   MCV 90.5 78.0 - 100.0 fL   MCH 30.3 26.0 - 34.0 pg   MCHC 33.5 30.0 - 36.0 g/dL   RDW 14.1 11.5 - 15.5 %   Platelets 286 150 - 400 K/uL   Neutrophils Relative % 67 %   Neutro Abs 7.0 1.7 - 7.7 K/uL   Lymphocytes Relative 27 %  Lymphs Abs 2.8 0.7 - 4.0 K/uL   Monocytes Relative 4 %   Monocytes Absolute 0.4 0.1 - 1.0 K/uL   Eosinophils Relative 2 %   Eosinophils Absolute 0.3 0.0 - 0.7 K/uL   Basophils Relative 0 %   Basophils Absolute 0.0 0.0 - 0.1 K/uL  Comprehensive metabolic panel     Status: Abnormal   Collection Time: 10/07/16  8:34 PM  Result Value Ref Range   Sodium 138 135 - 145 mmol/L   Potassium 3.6 3.5 - 5.1 mmol/L   Chloride 107 101 - 111 mmol/L   CO2 26 22 - 32 mmol/L   Glucose, Bld 118 (H) 65 - 99 mg/dL   BUN 7 6 - 20 mg/dL   Creatinine, Ser 0.61 0.44 - 1.00 mg/dL   Calcium 8.2 (L) 8.9 - 10.3 mg/dL   Total Protein 6.2 (L) 6.5 - 8.1 g/dL   Albumin 3.2 (L) 3.5 - 5.0 g/dL   AST 68 (H) 15 - 41 U/L   ALT 71 (H) 14 - 54 U/L   Alkaline Phosphatase 81 38 - 126 U/L   Total Bilirubin 0.5 0.3 - 1.2 mg/dL   GFR calc non Af Amer >60 >60 mL/min   GFR calc Af Amer >60 >60 mL/min    Comment: (NOTE) The eGFR has been calculated using the CKD EPI equation. This calculation has not been validated in all clinical situations. eGFR's persistently <60 mL/min signify possible Chronic Kidney Disease.    Anion gap 5 5 - 15  Ethanol     Status: None   Collection Time: 10/07/16  8:34 PM  Result Value Ref Range   Alcohol, Ethyl (B) <5 <5 mg/dL    Comment:        LOWEST DETECTABLE LIMIT FOR SERUM ALCOHOL IS 5 mg/dL FOR MEDICAL PURPOSES ONLY   Urine rapid drug screen (hosp performed)     Status: Abnormal   Collection Time: 10/08/16  1:05 AM  Result Value Ref Range   Opiates POSITIVE (A) NONE DETECTED   Cocaine POSITIVE (A) NONE DETECTED    Benzodiazepines POSITIVE (A) NONE DETECTED   Amphetamines NONE DETECTED NONE DETECTED   Tetrahydrocannabinol POSITIVE (A) NONE DETECTED   Barbiturates NONE DETECTED NONE DETECTED    Comment:        DRUG SCREEN FOR MEDICAL PURPOSES ONLY.  IF CONFIRMATION IS NEEDED FOR ANY PURPOSE, NOTIFY LAB WITHIN 5 DAYS.        LOWEST DETECTABLE LIMITS FOR URINE DRUG SCREEN Drug Class       Cutoff (ng/mL) Amphetamine      1000 Barbiturate      200 Benzodiazepine   740 Tricyclics       814 Opiates          300 Cocaine          300 THC              50     Current Facility-Administered Medications  Medication Dose Route Frequency Provider Last Rate Last Dose  . acetaminophen (TYLENOL) tablet 650 mg  650 mg Oral Q4H PRN Sherwood Gambler, MD      . benztropine (COGENTIN) tablet 1 mg  1 mg Oral QHS Sherwood Gambler, MD      . carbamazepine (TEGRETOL) tablet 200 mg  200 mg Oral TID Sherwood Gambler, MD   200 mg at 10/08/16 4818  . cloNIDine (CATAPRES) tablet 0.1 mg  0.1 mg Oral QID Sherwood Gambler, MD   0.1 mg at 10/08/16 1009  Followed by  . [START ON 10/10/2016] cloNIDine (CATAPRES) tablet 0.1 mg  0.1 mg Oral BID Sherwood Gambler, MD       Followed by  . [START ON 10/12/2016] cloNIDine (CATAPRES) tablet 0.1 mg  0.1 mg Oral Daily Sherwood Gambler, MD      . dicyclomine (BENTYL) capsule 20 mg  20 mg Oral Q6H PRN Sherwood Gambler, MD      . gabapentin (NEURONTIN) capsule 600 mg  600 mg Oral TID Sherwood Gambler, MD   600 mg at 10/08/16 0350  . hydrOXYzine (ATARAX/VISTARIL) tablet 50 mg  50 mg Oral Q6H PRN Sherwood Gambler, MD      . levothyroxine (SYNTHROID, LEVOTHROID) tablet 50 mcg  50 mcg Oral q morning - 10a Sherwood Gambler, MD   50 mcg at 10/08/16 [phone]  . loperamide (IMODIUM) capsule 2-4 mg  2-4 mg Oral PRN Sherwood Gambler, MD      . LORazepam (ATIVAN) injection 0-4 mg  0-4 mg Intravenous Q6H Sherwood Gambler, MD       Or  . LORazepam (ATIVAN) tablet 0-4 mg  0-4 mg Oral Q6H Sherwood Gambler, MD   1 mg at  10/08/16 0939  . [START ON 10/10/2016] LORazepam (ATIVAN) injection 0-4 mg  0-4 mg Intravenous Q12H Sherwood Gambler, MD       Or  . Derrill Memo ON 10/10/2016] LORazepam (ATIVAN) tablet 0-4 mg  0-4 mg Oral Q12H Sherwood Gambler, MD      . methocarbamol (ROBAXIN) tablet 500 mg  500 mg Oral Q8H PRN Sherwood Gambler, MD      . mirtazapine (REMERON) tablet 30 mg  30 mg Oral QHS Sherwood Gambler, MD      . nicotine (NICODERM CQ - dosed in mg/24 hours) patch 21 mg  21 mg Transdermal Daily Sherwood Gambler, MD   21 mg at 10/08/16 1829  . ondansetron (ZOFRAN) tablet 4 mg  4 mg Oral Q8H PRN Sherwood Gambler, MD      . pantoprazole (PROTONIX) EC tablet 40 mg  40 mg Oral Daily Sherwood Gambler, MD   40 mg at 10/08/16 0939  . QUEtiapine (SEROQUEL) tablet 300 mg  300 mg Oral [id number] Sherwood Gambler, MD   300 mg at 10/08/16 1008  . QUEtiapine (SEROQUEL) tablet 400 mg  400 mg Oral QHS Sherwood Gambler, MD      . thiamine (VITAMIN B-1) tablet 100 mg  100 mg Oral Daily Sherwood Gambler, MD   100 mg at 10/08/16 8938   Or  . thiamine (B-1) injection 100 mg  100 mg Intravenous Daily Sherwood Gambler, MD       Current Outpatient Prescriptions  Medication Sig Dispense Refill  . benztropine (COGENTIN) 1 MG tablet Take 1 tablet (1 mg total) by mouth at bedtime. (Patient not taking: Reported on 10/07/2016) 30 tablet 0  . carbamazepine (TEGRETOL) 200 MG tablet Take 200 mg by mouth 3 (three) times daily.    . citalopram (CELEXA) 20 MG tablet Take 20 mg by mouth daily.  1  . doxepin (SINEQUAN) 25 MG capsule Take 1 capsule (25 mg total) by mouth at bedtime. (Patient not taking: Reported on 10/07/2016) 30 capsule 0  . gabapentin (NEURONTIN) 300 MG capsule Take 2 capsules (600 mg total) by mouth 3 (three) times daily. (Patient not taking: Reported on 10/07/2016) 180 capsule 0  . hydrOXYzine (ATARAX/VISTARIL) 50 MG tablet Take 1 tablet (50 mg total) by mouth every 6 (six) hours as needed for anxiety (CIWA LESS THAN 10). (Patient not taking:  Reported  on 10/07/2016) 30 tablet 0  . levothyroxine (SYNTHROID, LEVOTHROID) 50 MCG tablet Take 1 tablet (50 mcg total) by mouth every morning. (Patient not taking: Reported on 10/07/2016) 30 tablet 0  . mirtazapine (REMERON) 30 MG tablet Take 1 tablet (30 mg total) by mouth at bedtime. (Patient not taking: Reported on 10/07/2016) 30 tablet 0  . nicotine polacrilex (NICORETTE) 2 MG gum Take 1 each (2 mg total) by mouth as needed for smoking cessation. (Patient not taking: Reported on 10/07/2016) 100 tablet 0  . pantoprazole (PROTONIX) 20 MG tablet Take 1 tablet (20 mg total) by mouth daily. (Patient not taking: Reported on 10/07/2016) 30 tablet 0  . QUEtiapine (SEROQUEL) 300 MG tablet Take 1 tablet (300 mg total) by mouth 2 (two) times daily at 7 am and 12 noon. (Patient not taking: Reported on 10/07/2016) 30 tablet 0  . QUEtiapine (SEROQUEL) 400 MG tablet Take 1 tablet (400 mg total) by mouth at bedtime. (Patient not taking: Reported on 10/07/2016) 30 tablet 0    Musculoskeletal: Unable to assess: camera  Psychiatric Specialty Exam: Physical Exam  Review of Systems  Psychiatric/Behavioral: Positive for depression, hallucinations (history of), substance abuse and suicidal ideas (intermittent and passive). Negative for memory loss. The patient is nervous/anxious. The patient does not have insomnia.   All other systems reviewed and are negative.   Blood pressure 101/62, pulse 65, temperature 97.6 F (36.4 C), temperature source Oral, resp. rate 12, height '5\' 7"'  (1.702 m), weight 68 kg (150 lb), SpO2 96 %.Body mass index is 23.49 kg/m.  General Appearance: Disheveled  Eye Contact:  Poor  Speech:  Slow, Slurred and and coherent  Volume:  Decreased  Mood:  Depressed and and in withdrawals  Affect:  Congruent, Depressed and Flat  Thought Process:  Coherent  Orientation:  Full (Time, Place, and Person)  Thought Content:  Logical  Suicidal Thoughts:  No, denies today but di inject heroin in the ED  bathroom  Homicidal Thoughts:  No  Memory:  Immediate;   Fair Recent;   Fair Remote;   Fair  Judgement:  Poor  Insight:  Lacking  Psychomotor Activity:  Decreased  Concentration:  Concentration: Poor and Attention Span: Poor  Recall:  Poor  Fund of Knowledge:  Fair  Language:  Good  Akathisia:  No  Handed:  Right  AIMS (if indicated):     Assets:  Communication Skills Resilience  ADL's:  Intact  Cognition:  WNL  Sleep:        Treatment Plan Summary: Daily contact with patient to assess and evaluate symptoms and progress in treatment and Medication management  Disposition: Recommend psychiatric Inpatient admission when medically cleared.  Ethelene Hal, NP 10/08/2016 10:18 AM

## 2016-10-08 NOTE — ED Notes (Signed)
Pt removed her heplock in restroom, MD informed.

## 2016-10-08 NOTE — Progress Notes (Signed)
Patient has been referred to the following inpatient treatment facilities: [facility], [facility], [facility], [facility], [facility], [facility].   At [facility]: [facility], [facility], [facility], [address] [facility], [facility], and [facility].  CSW in disposition will continue to seek placement.  [place] Abtsatia Adali Pennings, LCSWA Disposition staff 10/08/2016 4:41 PM

## 2016-10-08 NOTE — ED Provider Notes (Signed)
Patient is medically cleared for transfer to a psychiatric facility.   Donnetta Hutchingook, Laporcha Marchesi, MD 10/08/16 [phone]

## 2016-10-08 NOTE — ED Provider Notes (Signed)
Patient is currently awaiting reevaluation by psychiatry. She is asking for me to prescribe her Suboxone or phenobarbital could she is concerned about narcotic withdrawal given that she was given Narcan yesterday. I discussed that we cannot prescribe these or treat her with these here but I will try our other detox protocol while she is still in the ER. She is also on CIWA due to history of benzo abuse.   Pricilla LovelessGoldston, Stefany Starace, MD 10/08/16 [phone]

## 2016-10-08 NOTE — ED Notes (Signed)
Pt removed telemetry monitor.  MD informed.

## 2016-10-08 NOTE — ED Notes (Signed)
Pt given pad and mesh underwear upon request

## 2016-10-09 ENCOUNTER — Inpatient Hospital Stay (HOSPITAL_COMMUNITY)
Admission: AD | Admit: 2016-10-09 | Discharge: 2016-10-18 | DRG: 897 | Disposition: A | Discharge status: Home / Self Care | Payer: No Typology Code available for payment source | Attending: Psychiatry | Admitting: Psychiatry

## 2016-10-09 ENCOUNTER — Encounter (HOSPITAL_COMMUNITY): Payer: Self-pay | Admitting: *Deleted

## 2016-10-09 DIAGNOSIS — F3163 Bipolar disorder, current episode mixed, severe, without psychotic features: Secondary | ICD-10-CM | POA: Diagnosis not present

## 2016-10-09 DIAGNOSIS — Z79899 Other long term (current) drug therapy: Secondary | ICD-10-CM

## 2016-10-09 DIAGNOSIS — F4310 Post-traumatic stress disorder, unspecified: Secondary | ICD-10-CM | POA: Diagnosis present

## 2016-10-09 DIAGNOSIS — F1114 Opioid abuse with opioid-induced mood disorder: Secondary | ICD-10-CM | POA: Diagnosis present

## 2016-10-09 DIAGNOSIS — T401X1D Poisoning by heroin, accidental (unintentional), subsequent encounter: Secondary | ICD-10-CM

## 2016-10-09 DIAGNOSIS — F411 Generalized anxiety disorder: Secondary | ICD-10-CM | POA: Diagnosis present

## 2016-10-09 DIAGNOSIS — Z23 Encounter for immunization: Secondary | ICD-10-CM

## 2016-10-09 DIAGNOSIS — F332 Major depressive disorder, recurrent severe without psychotic features: Secondary | ICD-10-CM | POA: Diagnosis present

## 2016-10-09 DIAGNOSIS — F1911 Other psychoactive substance abuse, in remission: Secondary | ICD-10-CM

## 2016-10-09 DIAGNOSIS — Z818 Family history of other mental and behavioral disorders: Secondary | ICD-10-CM | POA: Diagnosis not present

## 2016-10-09 DIAGNOSIS — F429 Obsessive-compulsive disorder, unspecified: Secondary | ICD-10-CM | POA: Diagnosis present

## 2016-10-09 DIAGNOSIS — F17210 Nicotine dependence, cigarettes, uncomplicated: Secondary | ICD-10-CM | POA: Diagnosis present

## 2016-10-09 DIAGNOSIS — Z813 Family history of other psychoactive substance abuse and dependence: Secondary | ICD-10-CM | POA: Diagnosis not present

## 2016-10-09 DIAGNOSIS — F1120 Opioid dependence, uncomplicated: Secondary | ICD-10-CM | POA: Diagnosis present

## 2016-10-09 DIAGNOSIS — Z811 Family history of alcohol abuse and dependence: Secondary | ICD-10-CM | POA: Diagnosis not present

## 2016-10-09 MED ORDER — CLONIDINE HCL 0.1 MG PO TABS
ORAL_TABLET | ORAL | Status: AC
  Administered 2016-10-09: 0.1 mg via ORAL
  Filled 2016-10-09: qty 1

## 2016-10-09 MED ORDER — QUETIAPINE FUMARATE 300 MG PO TABS
300.0000 mg | ORAL_TABLET | ORAL | Status: DC
  Administered 2016-10-10: 300 mg via ORAL
  Filled 2016-10-09 (×6): qty 1

## 2016-10-09 MED ORDER — TRAZODONE HCL 50 MG PO TABS
50.0000 mg | ORAL_TABLET | Freq: Every evening | ORAL | Status: DC | PRN
  Filled 2016-10-09: qty 1

## 2016-10-09 MED ORDER — LEVOTHYROXINE SODIUM 50 MCG PO TABS
ORAL_TABLET | ORAL | Status: AC
  Administered 2016-10-09: 100 ug
  Filled 2016-10-09: qty 1

## 2016-10-09 MED ORDER — PANTOPRAZOLE SODIUM 40 MG PO TBEC
DELAYED_RELEASE_TABLET | ORAL | Status: AC
  Administered 2016-10-09: 40 mg
  Filled 2016-10-09: qty 1

## 2016-10-09 MED ORDER — HALOPERIDOL 5 MG PO TABS: 10.0000 mg | ORAL_TABLET | Freq: Two times a day (BID) | ORAL | Status: DC

## 2016-10-09 MED ORDER — MIRTAZAPINE 30 MG PO TABS
30.0000 mg | ORAL_TABLET | Freq: Every day | ORAL | Status: DC
  Administered 2016-10-10 – 2016-10-13 (×4): 30 mg via ORAL
  Filled 2016-10-09 (×7): qty 1

## 2016-10-09 MED ORDER — BENZTROPINE MESYLATE 1 MG PO TABS
ORAL_TABLET | ORAL | Status: AC
  Administered 2016-10-09: 1 mg
  Filled 2016-10-09: qty 1

## 2016-10-09 MED ORDER — METHOCARBAMOL 500 MG PO TABS
500.0000 mg | ORAL_TABLET | Freq: Three times a day (TID) | ORAL | Status: AC | PRN
  Administered 2016-10-09 – 2016-10-14 (×11): 500 mg via ORAL
  Filled 2016-10-09 (×12): qty 1

## 2016-10-09 MED ORDER — QUETIAPINE FUMARATE 400 MG PO TABS
400.0000 mg | ORAL_TABLET | Freq: Every day | ORAL | Status: DC
  Administered 2016-10-09 – 2016-10-17 (×9): 400 mg via ORAL
  Filled 2016-10-09 (×10): qty 1
  Filled 2016-10-09: qty 2
  Filled 2016-10-09: qty 1

## 2016-10-09 MED ORDER — MAGNESIUM HYDROXIDE 400 MG/5ML PO SUSP: 30.0000 mL | Freq: Every day | ORAL | Status: DC | PRN

## 2016-10-09 MED ORDER — BENZTROPINE MESYLATE 1 MG PO TABS
1.0000 mg | ORAL_TABLET | Freq: Every day | ORAL | Status: DC
  Filled 2016-10-09 (×3): qty 1

## 2016-10-09 MED ORDER — NAPROXEN 500 MG PO TABS
500.0000 mg | ORAL_TABLET | Freq: Two times a day (BID) | ORAL | Status: AC | PRN
Start: 2016-10-09 — End: 2016-10-14
  Administered 2016-10-10 – 2016-10-14 (×6): 500 mg via ORAL
  Filled 2016-10-09 (×6): qty 1

## 2016-10-09 MED ORDER — LOPERAMIDE HCL 2 MG PO CAPS
2.0000 mg | ORAL_CAPSULE | ORAL | Status: AC | PRN
  Administered 2016-10-10: 4 mg via ORAL
  Administered 2016-10-11 – 2016-10-14 (×6): 2 mg via ORAL
  Administered 2016-10-14: 4 mg via ORAL
  Filled 2016-10-09 (×3): qty 1
  Filled 2016-10-09: qty 2
  Filled 2016-10-09: qty 1
  Filled 2016-10-09 (×2): qty 2
  Filled 2016-10-09: qty 1

## 2016-10-09 MED ORDER — ALUM & MAG HYDROXIDE-SIMETH 200-200-20 MG/5ML PO SUSP
30.0000 mL | ORAL | Status: DC | PRN
  Administered 2016-10-12 – 2016-10-15 (×6): 30 mL via ORAL
  Filled 2016-10-09 (×6): qty 30

## 2016-10-09 MED ORDER — HYDROXYZINE HCL 50 MG PO TABS
50.0000 mg | ORAL_TABLET | Freq: Four times a day (QID) | ORAL | Status: DC | PRN
  Administered 2016-10-09 – 2016-10-17 (×13): 50 mg via ORAL
  Filled 2016-10-09 (×13): qty 1

## 2016-10-09 MED ORDER — CLONIDINE HCL 0.1 MG PO TABS
0.1000 mg | ORAL_TABLET | Freq: Four times a day (QID) | ORAL | Status: AC
  Administered 2016-10-09 – 2016-10-12 (×9): 0.1 mg via ORAL
  Filled 2016-10-09 (×10): qty 1

## 2016-10-09 MED ORDER — CLONIDINE HCL 0.1 MG PO TABS
0.1000 mg | ORAL_TABLET | ORAL | Status: AC
  Administered 2016-10-12 – 2016-10-14 (×4): 0.1 mg via ORAL
  Filled 2016-10-09 (×5): qty 1

## 2016-10-09 MED ORDER — CLONIDINE HCL 0.1 MG PO TABS
0.1000 mg | ORAL_TABLET | Freq: Every day | ORAL | Status: DC
  Filled 2016-10-09 (×2): qty 1

## 2016-10-09 MED ORDER — ACETAMINOPHEN 325 MG PO TABS
650.0000 mg | ORAL_TABLET | Freq: Four times a day (QID) | ORAL | Status: DC | PRN
  Administered 2016-10-12 – 2016-10-18 (×9): 650 mg via ORAL
  Filled 2016-10-09 (×9): qty 2

## 2016-10-09 MED ORDER — ONDANSETRON 4 MG PO TBDP
4.0000 mg | ORAL_TABLET | Freq: Four times a day (QID) | ORAL | Status: AC | PRN
  Administered 2016-10-09 – 2016-10-14 (×8): 4 mg via ORAL
  Filled 2016-10-09 (×8): qty 1

## 2016-10-09 MED ORDER — NICOTINE 21 MG/24HR TD PT24: 21.0000 mg | MEDICATED_PATCH | Freq: Every day | TRANSDERMAL | Status: DC

## 2016-10-09 MED ORDER — QUETIAPINE FUMARATE 200 MG PO TABS
ORAL_TABLET | ORAL | Status: AC
  Filled 2016-10-09: qty 2

## 2016-10-09 MED ORDER — CARBAMAZEPINE 200 MG PO TABS
200.0000 mg | ORAL_TABLET | Freq: Three times a day (TID) | ORAL | Status: DC
  Administered 2016-10-10 – 2016-10-18 (×26): 200 mg via ORAL
  Filled 2016-10-09 (×12): qty 1
  Filled 2016-10-09: qty 21
  Filled 2016-10-09 (×5): qty 1
  Filled 2016-10-09: qty 21
  Filled 2016-10-09 (×8): qty 1
  Filled 2016-10-09: qty 21
  Filled 2016-10-09 (×5): qty 1

## 2016-10-09 MED ORDER — GABAPENTIN 300 MG PO CAPS
ORAL_CAPSULE | ORAL | Status: AC
  Filled 2016-10-09: qty 2

## 2016-10-09 MED ORDER — GABAPENTIN 300 MG PO CAPS
600.0000 mg | ORAL_CAPSULE | Freq: Three times a day (TID) | ORAL | Status: DC
  Administered 2016-10-09 – 2016-10-14 (×14): 600 mg via ORAL
  Filled 2016-10-09 (×20): qty 2

## 2016-10-09 MED ORDER — NICOTINE 21 MG/24HR TD PT24
21.0000 mg | MEDICATED_PATCH | Freq: Every day | TRANSDERMAL | Status: DC
  Filled 2016-10-09: qty 1

## 2016-10-09 MED ORDER — PNEUMOCOCCAL VAC POLYVALENT 25 MCG/0.5ML IJ INJ
0.5000 mL | INJECTION | INTRAMUSCULAR | Status: AC
  Administered 2016-10-11: 0.5 mL via INTRAMUSCULAR

## 2016-10-09 MED ORDER — DICYCLOMINE HCL 20 MG PO TABS
20.0000 mg | ORAL_TABLET | Freq: Four times a day (QID) | ORAL | Status: AC | PRN
  Administered 2016-10-09 – 2016-10-14 (×15): 20 mg via ORAL
  Filled 2016-10-09 (×15): qty 1

## 2016-10-09 MED ORDER — LEVOTHYROXINE SODIUM 50 MCG PO TABS
50.0000 ug | ORAL_TABLET | Freq: Every day | ORAL | Status: DC
  Administered 2016-10-10 – 2016-10-18 (×9): 50 ug via ORAL
  Filled 2016-10-09 (×6): qty 1
  Filled 2016-10-09: qty 7
  Filled 2016-10-09: qty 2
  Filled 2016-10-09 (×4): qty 1

## 2016-10-09 MED ORDER — DICYCLOMINE HCL 10 MG PO CAPS
ORAL_CAPSULE | ORAL | Status: AC
  Filled 2016-10-09: qty 1

## 2016-10-09 MED ORDER — MIRTAZAPINE 30 MG PO TABS
ORAL_TABLET | ORAL | Status: AC
  Administered 2016-10-09: 30 mg
  Filled 2016-10-09: qty 1

## 2016-10-09 MED ORDER — GABAPENTIN 300 MG PO CAPS: 600.0000 mg | ORAL_CAPSULE | Freq: Once | ORAL | Status: AC

## 2016-10-09 NOTE — Progress Notes (Signed)
[facility], to Dr. Jama Flavorsobos, to room 403-1. ETA 19:00. [facility]  [place] Abtsatia Willie Loy, LCSWA Disposition staff 10/09/2016 1:29 PM

## 2016-10-09 NOTE — ED Notes (Signed)
[facility] to rm 403-1- She is expected there after 1900. Dr Adela Glimpseabos is attending with Dr Arville [facility]

## 2016-10-09 NOTE — ED Notes (Signed)
TTS at bedside. 

## 2016-10-09 NOTE — Tx Team (Signed)
Initial Treatment Plan 10/09/2016 11:58 PM Becky PancoastDanielle Nilsson ZOX:[id number]    PATIENT STRESSORS: Marital or family conflict Substance abuse   PATIENT STRENGTHS: Ability for insight Average or above average intelligence Capable of independent living General fund of knowledge Motivation for treatment/growth   PATIENT IDENTIFIED PROBLEMS: Substance Abuse Depression Suicidal thoughts "Get detoxed and I want to be able to talk to someone about my depression and anxiety"                     DISCHARGE CRITERIA:  Ability to meet basic life and health needs Improved stabilization in mood, thinking, and/or behavior Verbal commitment to aftercare and medication compliance Withdrawal symptoms are absent or subacute and managed without 24-hour nursing intervention  PRELIMINARY DISCHARGE PLAN: Attend aftercare/continuing care group Return to previous living arrangement  PATIENT/FAMILY INVOLVEMENT: This treatment plan has been presented to and reviewed with the patient, Becky PancoastDanielle Ord, and/or family member, .  The patient and family have been given the opportunity to ask questions and make suggestions.  Krystle Polcyn, [place], [place] 10/09/2016, 11:58 PM

## 2016-10-09 NOTE — [facility] Counselor (Addendum)
Counselor consulted with Patient.  Patient reported feelings of "withdrawal," after the use of Heroin and Opiates. Patient reported feeling "drowsy" during assessment.  Patient denies SI/HI/AVH.  [facility].  RN reports that Patient was asked for medication to sleep several times and wanders around when she is awake.  Elmore GuiseJaniah Ajay Strubel, LPCA, LCAS Therapeutic Triage Specialist

## 2016-10-09 NOTE — ED Notes (Signed)
Pt call to desk- when RN when in, pt was awakened and asked for crackers- she was shown crackers at bedside- pt request nabs and none available- pt informed

## 2016-10-09 NOTE — ED Notes (Signed)
Hold haldol per Dr Clarene DukeMcManus

## 2016-10-09 NOTE — ED Notes (Addendum)
Avasys is now in affect.  Sitter has been relieved.

## 2016-10-09 NOTE — ED Notes (Signed)
Report received 

## 2016-10-09 NOTE — ED Notes (Signed)
Pt reports multiple detox, longest clean 128 days, last NA meeting last month  Reports that she cannot afford her prescribed bipolar meds  Multiple fam members with psych and addiction px  She is conversant appropriate

## 2016-10-09 NOTE — Progress Notes (Signed)
Psychoeducational Group Note  Date:  10/09/2016 Time:  2354  Group Topic/Focus:  Wrap-Up Group:   The focus of this group is to help patients review their daily goal of treatment and discuss progress on daily workbooks.  Participation Level: Did Not Attend  Participation Quality:  Not Applicable  Affect:  Not Applicable  Cognitive:  Not Applicable  Insight:  Not Applicable  Engagement in Group: Not Applicable  Additional Comments:  The patient did not attend group since she was admitted to the hallway after the group concluded.   Hazle CocaGOODMAN, Neziah Vogelgesang S 10/09/2016, 11:54 PM

## 2016-10-09 NOTE — ED Notes (Signed)
Meal provided- pt is concerned regarding dental redness and swelling to her upper right canine/premolar teeth and gum area- she has redness to her gum and is rubbing it- dental caries are present

## 2016-10-09 NOTE — ED Notes (Signed)
[facility] Pt ambulatory with NAD.

## 2016-10-09 NOTE — ED Notes (Addendum)
Pt continues to ask for medications.  Performed CIWA and COWS on pt.  She states that she needs something for withdrawal and anxiety.  Explained to pt that she did not have any physical symptoms ie goosebumps, sweats, tremors, vomiting, or loose stools.  Pt stated that maybe she is alright due to taking a bunch of xanax before arriving to the ED.  Vital signs are within normal limits.  Pt is asking for an additional breakfast tray.

## 2016-10-09 NOTE — ED Notes (Signed)
Report To Lamount Crankerhris Judge, RN

## 2016-10-09 NOTE — ED Notes (Signed)
Resting quietly- pt has been informed of rm acceptance, when she will be transported

## 2016-10-10 DIAGNOSIS — R45851 Suicidal ideations: Secondary | ICD-10-CM

## 2016-10-10 DIAGNOSIS — F1490 Cocaine use, unspecified, uncomplicated: Secondary | ICD-10-CM

## 2016-10-10 MED ORDER — CITALOPRAM HYDROBROMIDE 10 MG PO TABS
ORAL_TABLET | ORAL | Status: AC
  Filled 2016-10-10: qty 1

## 2016-10-10 MED ORDER — CITALOPRAM HYDROBROMIDE 10 MG PO TABS
10.0000 mg | ORAL_TABLET | Freq: Every day | ORAL | Status: DC
  Administered 2016-10-10 – 2016-10-12 (×3): 10 mg via ORAL
  Filled 2016-10-10 (×4): qty 1

## 2016-10-10 MED ORDER — NICOTINE POLACRILEX 2 MG MT GUM
2.0000 mg | CHEWING_GUM | OROMUCOSAL | Status: DC | PRN
  Administered 2016-10-10 – 2016-10-18 (×37): 2 mg via ORAL
  Filled 2016-10-10 (×6): qty 1
  Filled 2016-10-10: qty 9
  Filled 2016-10-10 (×4): qty 1
  Filled 2016-10-10: qty 4
  Filled 2016-10-10 (×4): qty 1

## 2016-10-10 NOTE — Tx Team (Signed)
Interdisciplinary Treatment and Diagnostic Plan Update 10/10/2016 Time of Session: 9:30am  Becky [name]  MRN: [id number]  Principal Diagnosis: Bipolar 1 disorder, mixed, severe ([place])  Secondary Diagnoses: Principal Problem:   Bipolar 1 disorder, mixed, severe ([place])   Current Medications:  Current Facility-Administered Medications  Medication Dose Route Frequency Provider Last Rate Last Dose  . acetaminophen (TYLENOL) tablet 650 mg  650 mg Oral Q6H PRN Ethelene Hal, NP      . alum & mag hydroxide-simeth (MAALOX/MYLANTA) 200-200-20 MG/5ML suspension 30 mL  30 mL Oral Q4H PRN Ethelene Hal, NP      . carbamazepine (TEGRETOL) tablet 200 mg  200 mg Oral TID Ethelene Hal, NP   200 mg at 10/10/16 1200  . citalopram (CELEXA) tablet 10 mg  10 mg Oral Daily Derrill Center, NP   10 mg at 10/10/16 1204  . cloNIDine (CATAPRES) tablet 0.1 mg  0.1 mg Oral QID Ethelene Hal, NP   0.1 mg at 10/10/16 1159   Followed by  . [START ON 10/12/2016] cloNIDine (CATAPRES) tablet 0.1 mg  0.1 mg Oral BH-qamhs Ethelene Hal, NP       Followed by  . [START ON 10/15/2016] cloNIDine (CATAPRES) tablet 0.1 mg  0.1 mg Oral QAC breakfast Ethelene Hal, NP      . dicyclomine (BENTYL) tablet 20 mg  20 mg Oral Q6H PRN Ethelene Hal, NP   20 mg at 10/10/16 1159  . gabapentin (NEURONTIN) capsule 600 mg  600 mg Oral TID Ethelene Hal, NP   600 mg at 10/10/16 1200  . hydrOXYzine (ATARAX/VISTARIL) tablet 50 mg  50 mg Oral Q6H PRN Ethelene Hal, NP   50 mg at 10/10/16 1159  . levothyroxine (SYNTHROID, LEVOTHROID) tablet 50 mcg  50 mcg Oral QAC breakfast Ethelene Hal, NP   50 mcg at 10/10/16 1751  . loperamide (IMODIUM) capsule 2-4 mg  2-4 mg Oral PRN Ethelene Hal, NP   4 mg at 10/10/16 0535  . magnesium hydroxide (MILK OF MAGNESIA) suspension 30 mL  30 mL Oral Daily PRN Ethelene Hal, NP      . methocarbamol (ROBAXIN) tablet 500  mg  500 mg Oral Q8H PRN Ethelene Hal, NP   500 mg at 10/10/16 1159  . mirtazapine (REMERON) tablet 30 mg  30 mg Oral QHS Ethelene Hal, NP      . naproxen (NAPROSYN) tablet 500 mg  500 mg Oral BID PRN Ethelene Hal, NP   500 mg at 10/10/16 0258  . nicotine polacrilex (NICORETTE) gum 2 mg  2 mg Oral PRN Cobos, Myer Peer, MD   2 mg at 10/10/16 0852  . ondansetron (ZOFRAN-ODT) disintegrating tablet 4 mg  4 mg Oral Q6H PRN Ethelene Hal, NP   4 mg at 10/10/16 1158  . pneumococcal 23 valent vaccine (PNU-IMMUNE) injection 0.5 mL  0.5 mL Intramuscular Tomorrow-1000 Cobos, Fernando A, MD      . QUEtiapine (SEROQUEL) tablet 400 mg  400 mg Oral QHS Ethelene Hal, NP   400 mg at 10/09/16 2320    PTA Medications: Prescriptions Prior to Admission  Medication Sig Dispense Refill Last Dose  . benztropine (COGENTIN) 1 MG tablet Take 1 tablet (1 mg total) by mouth at bedtime. (Patient not taking: Reported on 10/07/2016) 30 tablet 0 Not Taking at Unknown time  . carbamazepine (TEGRETOL) 200 MG tablet Take 200 mg by mouth 3 (three) times daily.  Not Taking at Unknown time  . citalopram (CELEXA) 20 MG tablet Take 20 mg by mouth daily.  1 Not Taking at Unknown time  . doxepin (SINEQUAN) 25 MG capsule Take 1 capsule (25 mg total) by mouth at bedtime. (Patient not taking: Reported on 10/07/2016) 30 capsule 0 Not Taking at Unknown time  . gabapentin (NEURONTIN) 300 MG capsule Take 2 capsules (600 mg total) by mouth 3 (three) times daily. (Patient not taking: Reported on 10/07/2016) 180 capsule 0 Not Taking at Unknown time  . hydrOXYzine (ATARAX/VISTARIL) 50 MG tablet Take 1 tablet (50 mg total) by mouth every 6 (six) hours as needed for anxiety (CIWA LESS THAN 10). (Patient not taking: Reported on 10/07/2016) 30 tablet 0 Not Taking at Unknown time  . levothyroxine (SYNTHROID, LEVOTHROID) 50 MCG tablet Take 1 tablet (50 mcg total) by mouth every morning. (Patient not taking: Reported  on 10/07/2016) 30 tablet 0 Not Taking at Unknown time  . mirtazapine (REMERON) 30 MG tablet Take 1 tablet (30 mg total) by mouth at bedtime. (Patient not taking: Reported on 10/07/2016) 30 tablet 0 Not Taking at Unknown time  . nicotine polacrilex (NICORETTE) 2 MG gum Take 1 each (2 mg total) by mouth as needed for smoking cessation. (Patient not taking: Reported on 10/07/2016) 100 tablet 0 Not Taking at Unknown time  . pantoprazole (PROTONIX) 20 MG tablet Take 1 tablet (20 mg total) by mouth daily. (Patient not taking: Reported on 10/07/2016) 30 tablet 0 Not Taking at Unknown time  . QUEtiapine (SEROQUEL) 300 MG tablet Take 1 tablet (300 mg total) by mouth 2 (two) times daily at 7 am and 12 noon. (Patient not taking: Reported on 10/07/2016) 30 tablet 0 Not Taking at Unknown time  . QUEtiapine (SEROQUEL) 400 MG tablet Take 1 tablet (400 mg total) by mouth at bedtime. (Patient not taking: Reported on 10/07/2016) 30 tablet 0 Not Taking at Unknown time    Treatment Modalities: Medication Management, Group therapy, Case management,  1 to 1 session with clinician, Psychoeducation, Recreational therapy.  Patient Stressors: Marital or family conflict Substance abuse Patient Strengths: Ability for insight Average or above average intelligence Capable of independent living General fund of knowledge Motivation for treatment/growth  Physician Treatment Plan for Primary Diagnosis: Bipolar 1 disorder, mixed, severe ([place]) Long Term Goal(s): Improvement in symptoms so as ready for discharge Short Term Goals: Ability to identify changes in lifestyle to reduce recurrence of condition will improve Ability to verbalize feelings will improve Ability to identify and develop effective coping behaviors will improve Compliance with prescribed medications will improve Ability to identify triggers associated with substance abuse/mental health issues will improve Ability to verbalize feelings will improve Ability to  identify and develop effective coping behaviors will improve Ability to maintain clinical measurements within normal limits will improve  Medication Management: Evaluate patient's response, side effects, and tolerance of medication regimen.  Therapeutic Interventions: 1 to 1 sessions, Unit Group sessions and Medication administration.  Evaluation of Outcomes: Not Met  Physician Treatment Plan for Secondary Diagnosis: Principal Problem:   Bipolar 1 disorder, mixed, severe ([place])  Long Term Goal(s): Improvement in symptoms so as ready for discharge  Short Term Goals: Ability to identify changes in lifestyle to reduce recurrence of condition will improve Ability to verbalize feelings will improve Ability to identify and develop effective coping behaviors will improve Compliance with prescribed medications will improve Ability to identify triggers associated with substance abuse/mental health issues will improve Ability to verbalize feelings will improve Ability  to identify and develop effective coping behaviors will improve Ability to maintain clinical measurements within normal limits will improve  Medication Management: Evaluate patient's response, side effects, and tolerance of medication regimen.  Therapeutic Interventions: 1 to 1 sessions, Unit Group sessions and Medication administration.  Evaluation of Outcomes: Not Met  RN Treatment Plan for Primary Diagnosis: Bipolar 1 disorder, mixed, severe ([place]) Long Term Goal(s): Knowledge of disease and therapeutic regimen to maintain health will improve  Short Term Goals: Compliance with prescribed medications will improve  Medication Management: RN will administer medications as ordered by provider, will assess and evaluate patient's response and provide education to patient for prescribed medication. RN will report any adverse and/or side effects to prescribing provider.  Therapeutic Interventions: 1 on 1 counseling sessions,  Psychoeducation, Medication administration, Evaluate responses to treatment, Monitor vital signs and CBGs as ordered, Perform/monitor CIWA, COWS, AIMS and Fall Risk screenings as ordered, Perform wound care treatments as ordered.  Evaluation of Outcomes: Not Met  LCSW Treatment Plan for Primary Diagnosis: Bipolar 1 disorder, mixed, severe ([place]) Long Term Goal(s): Safe transition to appropriate next level of care at discharge, Engage patient in therapeutic group addressing interpersonal concerns. Short Term Goals: Engage patient in aftercare planning with referrals and resources, Facilitate patient progression through stages of change regarding substance use diagnoses and concerns, Identify triggers associated with mental health/substance abuse issues and Increase skills for wellness and recovery  Therapeutic Interventions: Assess for all discharge needs, 1 to 1 time with Social worker, Explore available resources and support systems, Assess for adequacy in community support network, Educate family and significant other(s) on suicide prevention, Complete Psychosocial Assessment, Interpersonal group therapy.  Evaluation of Outcomes: Not Met  Progress in Treatment: Attending groups: Pt is new to milieu, continuing to assess  Participating in groups: Pt is new to milieu, continuing to assess  Taking medication as prescribed: Yes, MD continues to assess for medication changes as needed Toleration medication: Yes, no side effects reported at this time Family/Significant other contact made: No, CSW assessing for appropriate contact Patient understands diagnosis: Continuing to assess Discussing patient identified problems/goals with staff: Yes Medical problems stabilized or resolved: Yes Denies suicidal/homicidal ideation: No, pt recently admitted for SI. Issues/concerns per patient self-inventory: None Other: N/A  New problem(s) identified: None identified at this time.   New Short Term/Long Term  Goal(s): None identified at this time.   Discharge Plan or Barriers: Pt will return home and follow-up outpatient with [facility].  Reason for Continuation of Hospitalization:   Depression Medication stabilization Suicidal ideation Withdrawal symptoms  Estimated Length of Stay: 3-5 days  Attendees: Patient: 10/10/2016 2:45 PM  Physician: Dr. Sanjuana Letters 10/10/2016 2:45 PM  Nursing: Trinna Post, RN 10/10/2016 2:45 PM  RN Care Manager:  10/10/2016 2:45 PM  Social Worker: Matthew Saras, [place] 10/10/2016 2:45 PM  Recreational Therapist:  10/10/2016 2:45 PM  Other: Lindell Spar, NP 10/10/2016 2:45 PM  Other:  10/10/2016 2:45 PM  Other: 10/10/2016 2:45 PM   Scribe for Treatment Team: Georga Kaufmann, MSW,LCSWA 10/10/2016 2:45 PM

## 2016-10-10 NOTE — Progress Notes (Signed)
[facility] Group Notes:  (Nursing/MHT/Case Management/Adjunct)  Date:  10/10/2016  Time:  11:51 PM  Type of Therapy:  Psychoeducational Skills  Participation Level:  Minimal  Participation Quality:  Drowsy  Affect:  Flat  Cognitive:  Appropriate  Insight:  Improving  Engagement in Group:  Limited  Modes of Intervention:  Education  Summary of Progress/Problems: The patient stated that she slept a great deal today but did not offer any additional details about her day. In terms of the theme for the day, her wellness strategy will be to avoid fattening foods and stop smoking.   Becky [name], [name] S 10/10/2016, 11:51 PM

## 2016-10-10 NOTE — Plan of Care (Signed)
Problem: Activity: Goal: Interest or engagement in activities will improve Outcome: Progressing Intermittently participative, patient is experiencing withdrawal symptoms but is present in milieu when symptoms subside.  Problem: Coping: Goal: Ability to verbalize frustrations and anger appropriately will improve Outcome: Progressing Appropriate interaction with staff but seeks meds for all discomforts  Problem: Education: Goal: Knowledge of disease or condition will improve Outcome: Progressing Patient has some experience with recovery programs and support groups. Considering long term drug treatment.
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
## 2016-10-10 NOTE — Progress Notes (Signed)
26 year old female pt admitted on involuntary basis. Becky [name] reports that she did in fact take heroin but denied that it was a suicide attempt. She reports that she has been using daily for the past couple of years and has been using steadily for the past 14 years. On admission, pt appears restless, anxious and appears to be going through withdrawal during this time. Becky [name] reports that she will either go home after discharge of that she may want to go to a long term treatment facility. Becky [name] denied any SI and is able to contract for safety while on the unit. Becky [name] was oriented to the unit and safety maintained.

## 2016-10-10 NOTE — H&P (Signed)
Psychiatric Admission Assessment Adult  Patient Identification: Becky [name] MRN:  [id number] Date of Evaluation:  10/10/2016 Chief Complaint:  Bipolar disorder MDD,sev Polysubstance Abuse Positive for Benzo, opiates, cocaine, and thc Principal Diagnosis: Bipolar 1 disorder, mixed, severe (HCC) Diagnosis:   Patient Active Problem List   Diagnosis Date Noted  . Bipolar 1 disorder, mixed, severe (HCC) [F31.63] 10/09/2016  . Opioid use disorder, moderate, dependence (HCC) [F11.20] 06/06/2016  . Sedative, hypnotic or anxiolytic use disorder, severe, dependence (HCC) [F13.20] 06/06/2016  . Cocaine use disorder, severe, dependence (HCC) [F14.20] 06/06/2016  . Cannabis use disorder, severe, dependence (HCC) [F12.20] 06/06/2016  . MDD (major depressive disorder), recurrent severe, without psychosis (HCC) [F33.2] 06/03/2016  . Bipolar I disorder, most recent episode depressed (HCC) [F31.30] 07/04/2015  . Overdose [T50.901A] 07/02/2015  . Lactic acidosis [E87.2] 07/02/2015  . Acute respiratory failure (HCC) [J96.00] 07/02/2015  . Sepsis (HCC) [A41.9] 07/02/2015  . AKI (acute kidney injury) (HCC) [N17.9]   . Altered mental status [R41.82]   . Pyrexia [R50.9]   . Abdominal pain, epigastric [R10.13] 06/17/2015  . Hepatitis C antibody test positive [R76.8] 06/17/2015  . Peptic ulcer disease [K27.9] 06/17/2015  . Epigastric pain [R10.13]   . Transaminitis [R74.0] 06/16/2015  . History of substance abuse [Z87.898] 06/16/2015  . Bipolar affective disorder, current episode manic with psychotic symptoms (HCC) [F31.2] 03/03/2014   History of Present Illness:Per [facility] Assessment note-Per note on chart written by Beryle Flock, [facility] Counselor:-Becky Harrelsonis an 26 y.o.femalewas brought into the [facility] tonight by [facility] after a 911 call from her boyfriend stating she had possibly overdosed on heroin. Once in the ED, pt injected more heroin in the bathroom per EDP. Per EDP, pt became erratic and  agitated after being delayed from being discharged and EDP decided to IVC pt. Pt denies SI, HI, SHI and AVH. Per pt record, pt has been previously diagnosed with Bipolar I D/O, PTSD, GAD and OCD. Pt was a poor historian due to sleepiness and irritability. Information for this assessment obtained from pt, EDP and pt record/hx. Pt denies SI and per hx, does not have a hx of SI. Pt denies AVH but, pt hx indicates that during other ED visits this year pt has been observed to be responding to internal stimuli and delusional with paranoia. Per pt record, pt has stated that she has AVH when she has "uppers" and/or cocaine. Pt sts she used today/tonight Heroin, Vicodin, Valium, Xanax, Cannabis and Cocaine. Per pt record, pt also has used methamphetamine and suboxone this year. Pt sts she does not drinks alcohol due to her ulcer. Pt sts that she currently has a psychiatrist but could not remember the name. Per pt record, pt has been a pt at [facility] but had not been seen there in months. Pt sts she also has a therapist whose name she cannot remember.   Pt has been hospitalized psychiatrically or for drug treatment in the past year. Pt has been treated at [facility] 2018 and [facility] in 2017. Pt sts that she has been psychiatrically hospitalized multiple times but not further details are available. Pt has been previously daignosed with PTSD but no details are available. Per pt hx, pt's brother committed suicide and pt's brother, mother and father have substances use issues. Per pt hx, pt has stated that she has a hx of "violent arguments with her mom." Pt sts she does not have access to guns or weapons.  Pt was dressed in scrubs and lyingon herhospital bed  sleeping or drowsey. Pt was drowsy or falling asleep throughout the assessment and had to be continually woken by a nurse at bedside. Pt was irritable and uncooperative. Pt kept pooreye contact, spoke in a slurredtone and at an excessively slowpace. Pt moved in a  slowmanner when moving. Pt's thought process was coherent and relevant when she answered questions. Judgement, insight and self-controlwas impaired. No indication of delusional thinking or response to internal stimuli. Pt's mood was stated as depressed but not anxious and herblunted, irritableaffect was congruent. Pt was oriented x 3, to person, place and situation.   On Evaluation: Becky [name] is awake, alert and oriented *3.  Denies suicidal or homicidal ideation. Denies auditory or visual hallucination and does not appear to be responding to internal stimuli. Patient reports previous inpatient admission.  Patient is requesting longer term inpatient admission. Support, encouragement and reassurance was provided.   Associated Signs/Symptoms: Depression Symptoms:  depressed mood, difficulty concentrating, hopelessness, anxiety, (Hypo) Manic Symptoms:  Impulsivity, Labiality of Mood, Anxiety Symptoms:  Social Anxiety, Psychotic Symptoms:  Hallucinations: None PTSD Symptoms: Avoidance:  Decreased Interest/Participation Total Time spent with patient: 30 minutes  Past Psychiatric History:  Is the patient at risk to self? Yes.    Has the patient been a risk to self in the past 6 months? Yes.    Has the patient been a risk to self within the distant past? Yes.    Is the patient a risk to others? No.  Has the patient been a risk to others in the past 6 months? No.  Has the patient been a risk to others within the distant past? No.   Prior Inpatient Therapy:   Prior Outpatient Therapy:    Alcohol Screening: 1. How often do you have a drink containing alcohol?: Never 9. Have you or someone else been injured as a result of your drinking?: No 10. Has a relative or friend or a doctor or another health worker been concerned about your drinking or suggested you cut down?: No Alcohol Use Disorder Identification Test Final Score (AUDIT): 0 Brief Intervention: AUDIT score less than 7 or  less-screening does not suggest unhealthy drinking-brief intervention not indicated Substance Abuse History in the last 12 months:  Yes.   Consequences of Substance Abuse: Withdrawal Symptoms:   Cramps Headaches Nausea Previous Psychotropic Medications: YES Psychological Evaluations: YES Past Medical History:  Past Medical History:  Diagnosis Date  . Bipolar 1 disorder (HCC)   . Bronchitis   . GAD (generalized anxiety disorder)   . Hepatitis C antibody test positive 06/17/2015  . History of substance abuse 06/16/2015  . OCD (obsessive compulsive disorder)   . Peptic ulcer   . Polysubstance abuse   . PTSD (post-traumatic stress disorder)     Past Surgical History:  Procedure Laterality Date  . OTHER SURGICAL HISTORY     scar tissue removed from right ovary   . OTHER SURGICAL HISTORY     fallopian tube repair  . WISDOM TOOTH EXTRACTION     Family History:  Family History  Problem Relation Age of Onset  . Drug abuse Brother   . Suicidality Brother   . Cancer Other   . Depression Mother   . Drug abuse Mother   . Alcoholism Father   . Bipolar disorder Cousin    Family Psychiatric  History:  Tobacco Screening: Have you used any form of tobacco in the last 30 days? (Cigarettes, Smokeless Tobacco, Cigars, and/or Pipes): Yes Tobacco use, Select all that apply:  5 or more cigarettes per day Are you interested in Tobacco Cessation Medications?: Yes, will notify MD for an order Counseled patient on smoking cessation including recognizing danger situations, developing coping skills and basic information about quitting provided: Refused/Declined practical counseling Social History:  History  Alcohol Use  . Yes    Comment: occ     History  Drug Use  . Types: Marijuana, Methamphetamines, MDMA (Ecstacy), Oxycodone, Benzodiazepines, Cocaine    Additional Social History:                           Allergies:   Allergies  Allergen Reactions  . Azithromycin Anaphylaxis   . Nsaids Other (See Comments)    Peptic ulcers    Lab Results: No results found for this or any previous visit (from the past 48 hour(s)).  Blood Alcohol level:  Lab Results  Component Value Date   ETH <5 10/07/2016   ETH <5 05/31/2016    Metabolic Disorder Labs:  Lab Results  Component Value Date   HGBA1C 4.9 06/07/2016   MPG 94 06/07/2016   MPG 117 (H) 03/05/2014   Lab Results  Component Value Date   PROLACTIN 49.9 (H) 06/07/2016   Lab Results  Component Value Date   CHOL 173 06/07/2016   TRIG 152 (H) 06/07/2016   HDL 67 06/07/2016   CHOLHDL 2.6 06/07/2016   VLDL 30 06/07/2016   LDLCALC 76 06/07/2016   LDLCALC 130 (H) 03/05/2014    Current Medications: Current Facility-Administered Medications  Medication Dose Route Frequency Provider Last Rate Last Dose  . acetaminophen (TYLENOL) tablet 650 mg  650 mg Oral Q6H PRN Laveda AbbeParks, Laurie Britton, NP      . alum & mag hydroxide-simeth (MAALOX/MYLANTA) 200-200-20 MG/5ML suspension 30 mL  30 mL Oral Q4H PRN Laveda AbbeParks, Laurie Britton, NP      . benztropine (COGENTIN) tablet 1 mg  1 mg Oral QHS Laveda AbbeParks, Laurie Britton, NP      . carbamazepine (TEGRETOL) tablet 200 mg  200 mg Oral TID Laveda AbbeParks, Laurie Britton, NP   200 mg at 10/10/16 0849  . cloNIDine (CATAPRES) tablet 0.1 mg  0.1 mg Oral QID Laveda AbbeParks, Laurie Britton, NP   0.1 mg at 10/09/16 2320   Followed by  . [START ON 10/12/2016] cloNIDine (CATAPRES) tablet 0.1 mg  0.1 mg Oral BH-qamhs Laveda AbbeParks, Laurie Britton, NP       Followed by  . [START ON 10/15/2016] cloNIDine (CATAPRES) tablet 0.1 mg  0.1 mg Oral QAC breakfast Laveda AbbeParks, Laurie Britton, NP      . dicyclomine (BENTYL) tablet 20 mg  20 mg Oral Q6H PRN Laveda AbbeParks, Laurie Britton, NP   20 mg at 10/10/16 0536  . gabapentin (NEURONTIN) capsule 600 mg  600 mg Oral TID Laveda AbbeParks, Laurie Britton, NP   600 mg at 10/10/16 0849  . hydrOXYzine (ATARAX/VISTARIL) tablet 50 mg  50 mg Oral Q6H PRN Laveda AbbeParks, Laurie Britton, NP   50 mg at 10/10/16 0536  .  levothyroxine (SYNTHROID, LEVOTHROID) tablet 50 mcg  50 mcg Oral QAC breakfast Laveda AbbeParks, Laurie Britton, NP   50 mcg at 10/10/16 [id number]  . loperamide (IMODIUM) capsule 2-4 mg  2-4 mg Oral PRN Laveda AbbeParks, Laurie Britton, NP   4 mg at 10/10/16 0535  . magnesium hydroxide (MILK OF MAGNESIA) suspension 30 mL  30 mL Oral Daily PRN Laveda AbbeParks, Laurie Britton, NP      . methocarbamol (ROBAXIN) tablet 500 mg  500 mg Oral Q8H PRN  Laveda Abbe, NP   500 mg at 10/09/16 2320  . mirtazapine (REMERON) tablet 30 mg  30 mg Oral QHS Laveda Abbe, NP      . naproxen (NAPROSYN) tablet 500 mg  500 mg Oral BID PRN Laveda Abbe, NP   500 mg at 10/10/16 1610  . nicotine polacrilex (NICORETTE) gum 2 mg  2 mg Oral PRN Cobos, Rockey Situ, MD   2 mg at 10/10/16 0852  . ondansetron (ZOFRAN-ODT) disintegrating tablet 4 mg  4 mg Oral Q6H PRN Laveda Abbe, NP   4 mg at 10/10/16 0536  . pneumococcal 23 valent vaccine (PNU-IMMUNE) injection 0.5 mL  0.5 mL Intramuscular Tomorrow-1000 Cobos, Fernando A, MD      . QUEtiapine (SEROQUEL) tablet 300 mg  300 mg Oral [id number] Laveda Abbe, NP   300 mg at 10/10/16 8119  . QUEtiapine (SEROQUEL) tablet 400 mg  400 mg Oral QHS Laveda Abbe, NP   400 mg at 10/09/16 2320  . traZODone (DESYREL) tablet 50 mg  50 mg Oral QHS PRN Laveda Abbe, NP       PTA Medications: Prescriptions Prior to Admission  Medication Sig Dispense Refill Last Dose  . benztropine (COGENTIN) 1 MG tablet Take 1 tablet (1 mg total) by mouth at bedtime. (Patient not taking: Reported on 10/07/2016) 30 tablet 0 Not Taking at Unknown time  . carbamazepine (TEGRETOL) 200 MG tablet Take 200 mg by mouth 3 (three) times daily.   Not Taking at Unknown time  . citalopram (CELEXA) 20 MG tablet Take 20 mg by mouth daily.  1 Not Taking at Unknown time  . doxepin (SINEQUAN) 25 MG capsule Take 1 capsule (25 mg total) by mouth at bedtime. (Patient not taking: Reported on 10/07/2016) 30 capsule  0 Not Taking at Unknown time  . gabapentin (NEURONTIN) 300 MG capsule Take 2 capsules (600 mg total) by mouth 3 (three) times daily. (Patient not taking: Reported on 10/07/2016) 180 capsule 0 Not Taking at Unknown time  . hydrOXYzine (ATARAX/VISTARIL) 50 MG tablet Take 1 tablet (50 mg total) by mouth every 6 (six) hours as needed for anxiety (CIWA LESS THAN 10). (Patient not taking: Reported on 10/07/2016) 30 tablet 0 Not Taking at Unknown time  . levothyroxine (SYNTHROID, LEVOTHROID) 50 MCG tablet Take 1 tablet (50 mcg total) by mouth every morning. (Patient not taking: Reported on 10/07/2016) 30 tablet 0 Not Taking at Unknown time  . mirtazapine (REMERON) 30 MG tablet Take 1 tablet (30 mg total) by mouth at bedtime. (Patient not taking: Reported on 10/07/2016) 30 tablet 0 Not Taking at Unknown time  . nicotine polacrilex (NICORETTE) 2 MG gum Take 1 each (2 mg total) by mouth as needed for smoking cessation. (Patient not taking: Reported on 10/07/2016) 100 tablet 0 Not Taking at Unknown time  . pantoprazole (PROTONIX) 20 MG tablet Take 1 tablet (20 mg total) by mouth daily. (Patient not taking: Reported on 10/07/2016) 30 tablet 0 Not Taking at Unknown time  . QUEtiapine (SEROQUEL) 300 MG tablet Take 1 tablet (300 mg total) by mouth 2 (two) times daily at 7 am and 12 noon. (Patient not taking: Reported on 10/07/2016) 30 tablet 0 Not Taking at Unknown time  . QUEtiapine (SEROQUEL) 400 MG tablet Take 1 tablet (400 mg total) by mouth at bedtime. (Patient not taking: Reported on 10/07/2016) 30 tablet 0 Not Taking at Unknown time    Musculoskeletal: Strength & Muscle Tone: within normal limits Gait &  Station: normal Patient leans: N/A  Psychiatric Specialty Exam: Physical Exam  Nursing note and vitals reviewed. Constitutional: She is oriented to person, place, and time.  Cardiovascular: Normal rate and regular rhythm.   Neurological: She is alert and oriented to person, place, and time.  Psychiatric: She  has a normal mood and affect. Her behavior is normal.   Review of Systems  Psychiatric/Behavioral: Positive for depression, substance abuse and suicidal ideas. The patient is nervous/anxious.     Blood pressure 111/75, pulse 100, temperature 97.8 F (36.6 C), temperature source Oral, resp. rate 18, height 5\' 3"  (1.6 m), weight 65.3 kg (144 lb).Body mass index is 25.51 kg/m.  General Appearance: Disheveled and Guarded  Eye Contact:  Fair  Speech:  Clear and Coherent  Volume:  Normal  Mood:  Anxious and Depressed  Affect:  Congruent  Thought Process:  Coherent  Orientation:  Full (Time, Place, and Person)  Thought Content:  Hallucinations: None  Suicidal Thoughts:  Yes.  with intent/plan  Homicidal Thoughts:  No  Memory:  Immediate;   Fair Recent;   Fair Remote;   Fair  Judgement:  Intact  Insight:  Present  Psychomotor Activity:  Restlessness  Concentration:  Concentration: Fair  Recall:  [employer] of Knowledge:  Fair  Language:  Fair  Akathisia:  No  Handed:  Right  AIMS (if indicated):     Assets:  Communication Skills Desire for Improvement Social Support Talents/Skills  ADL's:  Intact  Cognition:  WNL  Sleep:  Number of Hours: 5.5     I agree with current treatment plan on 10/10/2016, Patient seen face-to-face for psychiatric evaluation follow-up, chart reviewed and discussed with MD Izediuno. Reviewed the information documented and agree with the treatment plan.   Treatment Plan Summary: Daily contact with patient to assess and evaluate symptoms and progress in treatment and Medication management   - STI testing was order- pending results  Continue with Celexa 10 mg, Tegretol 200mg  TID, Seroquel 300 mg, Neurontin 600 mg  for mood stabilization. Continue with Remeron 15 mg for insomnia Started on COWS/ ClonidineProtocol Will continue to monitor vitals ,medication compliance and treatment side effects while patient is here.  Reviewed labs BAL -, UDS - pos for  cocaine, thc, opiates and benzodizpines.  CSW will start working on disposition.  Patient to participate in therapeutic milieu  Observation Level/Precautions:  15 minute checks  Laboratory:  CBC Chemistry Profile UDS UA  Psychotherapy:  Individual and group session  Medications:  See above  Consultations:  Psychiatry  Discharge Concerns: Safety, stabilization, and risk of access to medication and medication stabilization    Estimated LOS:5-7 days  Other:     Physician Treatment Plan for Primary Diagnosis: Bipolar 1 disorder, mixed, severe (HCC) Long Term Goal(s): Improvement in symptoms so as ready for discharge  Short Term Goals: Ability to identify changes in lifestyle to reduce recurrence of condition will improve, Ability to verbalize feelings will improve, Ability to identify and develop effective coping behaviors will improve, Compliance with prescribed medications will improve and Ability to identify triggers associated with substance abuse/mental health issues will improve  Physician Treatment Plan for Secondary Diagnosis: Principal Problem:   Bipolar 1 disorder, mixed, severe (HCC)  Long Term Goal(s): Improvement in symptoms so as ready for discharge  Short Term Goals: Ability to verbalize feelings will improve, Ability to identify and develop effective coping behaviors will improve and Ability to maintain clinical measurements within normal limits will improve  I certify  that inpatient services furnished can reasonably be expected to improve the patient's condition.    Oneta Rack, NP 5/28/201811:56 AM

## 2016-10-10 NOTE — [facility] Group Notes (Signed)
[facility]   10/10/2016  8:45 AM  Participation Quality: Pt invited. Did not attend.  Jonathon JordanLynn B Mercadez Heitman, MSW, LCSWA 10/10/2016 1:08 PM

## 2016-10-10 NOTE — Progress Notes (Signed)
Recreation Therapy Notes  Date: 10/10/16 Time: 0930 Location: 300 Hall Dayroom  Group Topic: Stress Management  Goal Area(s) Addresses:  Patient will verbalize importance of using healthy stress management.  Patient will identify positive emotions associated with healthy stress management.   Intervention: Stress Management  Activity :  Guided Imagery.  LRT introduced the stress management concept of guided imagery.  LRT read [name] script to allow patients to engage in guided imagery.  Patients were to follow along to fully participate in the activity.  Education:  Stress Management, Discharge Planning.   Education Outcome: Acknowledges edcuation/In group clarification offered/Needs additional education  Clinical Observations/Feedback: Pt did not attend group.   Becky [name], LRT/CTRS         Becky [name] 10/10/2016 11:55 AM 

## 2016-10-10 NOTE — [facility] Suicide Risk Assessment (Signed)
[facility] Admission Suicide Risk Assessment   Nursing information obtained from:    Demographic factors:    Current Mental Status:    Loss Factors:    Historical Factors:    Risk Reduction Factors:     Total Time spent with patient: 45 minutes Principal Problem: Bipolar 1 disorder, mixed, severe (HCC) Diagnosis:   Patient Active Problem List   Diagnosis Date Noted  . Bipolar 1 disorder, mixed, severe (HCC) [F31.63] 10/09/2016  . Opioid use disorder, moderate, dependence (HCC) [F11.20] 06/06/2016  . Sedative, hypnotic or anxiolytic use disorder, severe, dependence (HCC) [F13.20] 06/06/2016  . Cocaine use disorder, severe, dependence (HCC) [F14.20] 06/06/2016  . Cannabis use disorder, severe, dependence (HCC) [F12.20] 06/06/2016  . MDD (major depressive disorder), recurrent severe, without psychosis (HCC) [F33.2] 06/03/2016  . Bipolar I disorder, most recent episode depressed (HCC) [F31.30] 07/04/2015  . Overdose [T50.901A] 07/02/2015  . Lactic acidosis [E87.2] 07/02/2015  . Acute respiratory failure (HCC) [J96.00] 07/02/2015  . Sepsis (HCC) [A41.9] 07/02/2015  . AKI (acute kidney injury) (HCC) [N17.9]   . Altered mental status [R41.82]   . Pyrexia [R50.9]   . Abdominal pain, epigastric [R10.13] 06/17/2015  . Hepatitis C antibody test positive [R76.8] 06/17/2015  . Peptic ulcer disease [K27.9] 06/17/2015  . Epigastric pain [R10.13]   . Transaminitis [R74.0] 06/16/2015  . History of substance abuse [Z87.898] 06/16/2015  . Bipolar affective disorder, current episode manic with psychotic symptoms (HCC) [F31.2] 03/03/2014   Subjective Data:  26 yo Caucasian female. Background history of SUD and Mood Disorder. Repeated presentation here. Repeated suicidal behavior. Presented via EMS. Overdosed on heroine. Required treatment with Narcan. Lethargic and sleepy. Was not able to engage.  Continued Clinical Symptoms:  Alcohol Use Disorder Identification Test Final Score (AUDIT): 0 The  "Alcohol Use Disorders Identification Test", Guidelines for Use in Primary Care, Second Edition.  World Science writer [facility]). Score between 0-7:  no or low risk or alcohol related problems. Score between 8-15:  moderate risk of alcohol related problems. Score between 16-19:  high risk of alcohol related problems. Score 20 or above:  warrants further diagnostic evaluation for alcohol dependence and treatment.   CLINICAL FACTORS:  Substance Use Mood Disorder   Musculoskeletal: Strength & Muscle Tone: Unable to assess at this time.  Gait & Station: Unable to assess at this time.  Patient leans: Unable to assess at this time.   Psychiatric Specialty Exam: Physical Exam Unable to assess at this time.   ROS  Blood pressure 111/75, pulse 100, temperature 97.8 F (36.6 C), temperature source Oral, resp. rate 18, height 5\' 3"  (1.6 m), weight 65.3 kg (144 lb).Body mass index is 25.51 kg/m.  General Appearance: sleeping quietly  Eye Contact:  Unable to assess at this time.   Speech:  Unable to assess at this time.   Volume:  Unable to assess at this time.   Mood:  Unable to assess at this time.   Affect:  Unable to assess at this time.   Thought Process:  Unable to assess at this time.   Orientation:  Unable to assess at this time.   Thought Content:  Unable to assess at this time.   Suicidal Thoughts:  Unable to assess at this time.   Homicidal Thoughts:  viral upper respiratory infection  Memory:  Unable to assess at this time.   Judgement:  Unable to assess at this time.   Insight:  Unable to assess at this time.   Psychomotor Activity:  Unable  to assess at this time.   Concentration:  Unable to assess at this time.   Recall:  Unable to assess at this time.   Fund of Knowledge:  Unable to assess at this time.   Language:  Unable to assess at this time.   Akathisia:  Unable to assess at this time.   Handed:  Unable to assess at this time.   AIMS (if indicated):     Assets:   Unable to assess at this time.   ADL's:  Unable to assess at this time.   Cognition:  Unable to assess at this time.   Sleep:  Number of Hours: 5.5      COGNITIVE FEATURES THAT CONTRIBUTE TO RISK:  Unable to assess at this time.     SUICIDE RISK:   Severe:  Frequent, intense, and enduring suicidal ideation, specific plan, no subjective intent, but some objective markers of intent (i.e., choice of lethal method), the method is accessible, some limited preparatory behavior, evidence of impaired self-control, severe dysphoria/symptomatology, multiple risk factors present, and few if any protective factors, particularly a lack of social support.  PLAN OF CARE:  1. Suicide precautions 2. Continue home medications  I certify that inpatient services furnished can reasonably be expected to improve the patient's condition.   Georgiann CockerVincent A Ezreal Turay, MD 10/10/2016, 4:14 PM

## 2016-10-11 DIAGNOSIS — F1390 Sedative, hypnotic, or anxiolytic use, unspecified, uncomplicated: Secondary | ICD-10-CM

## 2016-10-11 DIAGNOSIS — Z811 Family history of alcohol abuse and dependence: Secondary | ICD-10-CM

## 2016-10-11 DIAGNOSIS — F3163 Bipolar disorder, current episode mixed, severe, without psychotic features: Secondary | ICD-10-CM

## 2016-10-11 DIAGNOSIS — F1290 Cannabis use, unspecified, uncomplicated: Secondary | ICD-10-CM

## 2016-10-11 DIAGNOSIS — Z813 Family history of other psychoactive substance abuse and dependence: Secondary | ICD-10-CM

## 2016-10-11 DIAGNOSIS — F17210 Nicotine dependence, cigarettes, uncomplicated: Secondary | ICD-10-CM

## 2016-10-11 DIAGNOSIS — Z818 Family history of other mental and behavioral disorders: Secondary | ICD-10-CM

## 2016-10-11 DIAGNOSIS — F1190 Opioid use, unspecified, uncomplicated: Secondary | ICD-10-CM

## 2016-10-11 LAB — PREGNANCY, URINE: Preg Test, Ur: NEGATIVE

## 2016-10-11 LAB — RPR: RPR Ser Ql: NONREACTIVE

## 2016-10-11 LAB — GC/CHLAMYDIA PROBE AMP (~~LOC~~) NOT AT ARMC
Chlamydia: NEGATIVE
Neisseria Gonorrhea: NEGATIVE

## 2016-10-11 LAB — HIV ANTIBODY (ROUTINE TESTING W REFLEX): HIV Screen 4th Generation wRfx: NONREACTIVE

## 2016-10-11 NOTE — Plan of Care (Signed)
Problem: Education: Goal: Mental status will improve Outcome: Progressing Patient remains anxious, however, states it is better since admission.

## 2016-10-11 NOTE — Progress Notes (Signed)
Recreation Therapy Notes  Animal-Assisted Activity (AAA) Program Checklist/Progress Notes Patient Eligibility Criteria Checklist & Daily Group note for Rec TxIntervention  Date: 10/11/2016 Time: 2:55pm Location: 400 hall dayroom   AAA/T Program Assumption of Risk Form signed by Patient/ or Parent Legal Guardian Yes  Patient is free of allergies or sever asthma Yes  Patient reports no fear of animals Yes  Patient reports no history of cruelty to animals Yes  Patient understands his/her participation is voluntary Yes  Patient washes hands before animal contact Yes  Patient washes hands after animal contact Yes  Behavioral Response: Appropriate  Education:Hand Washing, Appropriate Animal Interaction   Education Outcome: Acknowledges education.   Clinical Observations/Feedback: Patient attended session and interacted appropriately with therapy dog and peers.   Becky Fullerachel Geovannie Vilar, Recreational Therapy Intern        Becky [name] 10/11/2016 3:20 PM

## 2016-10-11 NOTE — Progress Notes (Signed)
D: Patient is upset because her seroquel has been discontinued during the day.  Patient states, "I haven't even seen a doctor since I've been here.  I want that put back in."  Explained to patient that MD will have to order the medication, however, at the moment it is discontinued.  She complains of severe withdrawal symptoms and cravings.  She reports tremors, diarrhea, cravings, agitation, cramping, nausea and irritability.  Patient rates her depression as a 7; hopelessness as a 8; anxiety as a 10.  She denies any thoughts of self harm.  Patient reports body aches and poor sleep.  She is observed in the day room interacting with her peers.  Patient received prn medications for withdrawal symptoms.   A: Continue to monitor medication management and MD orders.  Safety checks completed every 15 minutes per protocol.  Offer support and encouragement as needed. R: Patient is receptive to staff; her behavior is appropriate.

## 2016-10-11 NOTE — [facility] Group Notes (Signed)
[facility] LCSW Group Therapy  10/11/2016 1:15pm  Type of Therapy:  Group Therapy vercoming Obstacles  Pt did not attend, declined invitation.   Vernie ShanksLauren Abagail Limb, LCSW 10/11/2016 4:20 PM

## 2016-10-11 NOTE — Progress Notes (Addendum)
Patient fell in the cafeteria at 1815.  Patient has been ambulatory without any assistance.  she has been in no physical distress.  Patient was in line and appeared to accidentally fell onto right knee.  She denies any pain or discomfort.  Patient's fall risk changed due to incident.  Fall safety education completed with patient. [facility].

## 2016-10-11 NOTE — Progress Notes (Signed)
Pt has been up to the med window numerous times this evening.  She has received almost every prn medication that is available to her.  She presents lethargic with slurred speech.  Pt denies SI/HI/AVH.  Pt makes her needs known to staff.  She has been cooperative and redirectable.  Pt is interested in further rehab treatment for her addictions.  Support and encouragement offered.  Medicated as ordered.  Discharge plans are in process.  Safety maintained with q15 minute checks.

## 2016-10-11 NOTE — Progress Notes (Signed)
Nursing Progress Note: 7p-7a D: Pt currently presents with a anxious/depressed/slow/lethargic affect and behavior. Pt states "I really need my Seroquel tonight. I'm going to be mad if I don't get it. I have taken it for years." Interacting fair with milieu. Pt reports ok sleep with current medication regimen.   A: Pt provided with medications per providers orders. Pt's labs and vitals were monitored throughout the night. Pt supported emotionally and encouraged to express concerns and questions. Pt educated on medications.  R: Pt's safety ensured with 15 minute and environmental checks. Pt currently denies SI/HI/Self Harm and AVH. Pt verbally contracts to seek staff if SI/HI or A/VH occurs and to consult with staff before acting on any harmful thoughts. Will continue to monitor.

## 2016-10-11 NOTE — Progress Notes (Signed)
[facility]  10/11/2016 3:46 PM Becky PancoastDanielle [name]  MRN:  [id number]   Subjective:  Patient states that she did heroin to get "fucked up." Objective:  Becky HeckDanielle was admitted after she overdosed on heroin and a couple of pain pills.  [facility], day she was discharged she overdosed on Seroquel.  She was in room, somnolent, looks "high".  She states, "I want to get back on my Seroquel, I was on 300 in the morning.  300 in the afternoon and 400 at night."  Per nursing, she is not disrupting milieu and is asking her medications.    Principal Problem: Bipolar 1 disorder, mixed, severe (HCC) Diagnosis:   Patient Active Problem List   Diagnosis Date Noted  . Bipolar 1 disorder, mixed, severe (HCC) [F31.63] 10/09/2016  . Opioid use disorder, moderate, dependence (HCC) [F11.20] 06/06/2016  . Sedative, hypnotic or anxiolytic use disorder, severe, dependence (HCC) [F13.20] 06/06/2016  . Cocaine use disorder, severe, dependence (HCC) [F14.20] 06/06/2016  . Cannabis use disorder, severe, dependence (HCC) [F12.20] 06/06/2016  . MDD (major depressive disorder), recurrent severe, without psychosis (HCC) [F33.2] 06/03/2016  . Bipolar I disorder, most recent episode depressed (HCC) [F31.30] 07/04/2015  . Overdose [T50.901A] 07/02/2015  . Lactic acidosis [E87.2] 07/02/2015  . Acute respiratory failure (HCC) [J96.00] 07/02/2015  . Sepsis (HCC) [A41.9] 07/02/2015  . AKI (acute kidney injury) (HCC) [N17.9]   . Altered mental status [R41.82]   . Pyrexia [R50.9]   . Abdominal pain, epigastric [R10.13] 06/17/2015  . Hepatitis C antibody test positive [R76.8] 06/17/2015  . Peptic ulcer disease [K27.9] 06/17/2015  . Epigastric pain [R10.13]   . Transaminitis [R74.0] 06/16/2015  . History of substance abuse [Z87.898] 06/16/2015  . Bipolar affective disorder, current episode manic with psychotic symptoms (HCC) [F31.2] 03/03/2014   Total Time spent with patient: 30  minutes  Past Psychiatric History: see HPI  Past Medical History:  Past Medical History:  Diagnosis Date  . Bipolar 1 disorder (HCC)   . Bronchitis   . GAD (generalized anxiety disorder)   . Hepatitis C antibody test positive 06/17/2015  . History of substance abuse 06/16/2015  . OCD (obsessive compulsive disorder)   . Peptic ulcer   . Polysubstance abuse   . PTSD (post-traumatic stress disorder)     Past Surgical History:  Procedure Laterality Date  . OTHER SURGICAL HISTORY     scar tissue removed from right ovary   . OTHER SURGICAL HISTORY     fallopian tube repair  . WISDOM TOOTH EXTRACTION     Family History:  Family History  Problem Relation Age of Onset  . Drug abuse Brother   . Suicidality Brother   . Cancer Other   . Depression Mother   . Drug abuse Mother   . Alcoholism Father   . Bipolar disorder Cousin    Family Psychiatric  History: see HPI Social History:  History  Alcohol Use  . Yes    Comment: occ     History  Drug Use  . Types: Marijuana, Methamphetamines, MDMA (Ecstacy), Oxycodone, Benzodiazepines, Cocaine    Social History   Social History  . Marital status: Legally Separated    Spouse name: N/A  . Number of children: N/A  . Years of education: N/A   Social History Main Topics  . Smoking status: Current Every Day Smoker    Packs/day: 1.00    Years: 10.00    Types: Cigarettes  . Smokeless tobacco:  Never Used  . Alcohol use Yes     Comment: occ  . Drug use: Yes    Types: Marijuana, Methamphetamines, MDMA (Ecstacy), Oxycodone, Benzodiazepines, Cocaine  . Sexual activity: Yes    Birth control/ protection: None   Other Topics Concern  . None   Social History Narrative  . None   Additional Social History:   Sleep: Good  Appetite:  Good  Current Medications: Current Facility-Administered Medications  Medication Dose Route Frequency Provider Last Rate Last Dose  . acetaminophen (TYLENOL) tablet 650 mg  650 mg Oral Q6H PRN  Laveda Abbe, NP      . alum & mag hydroxide-simeth (MAALOX/MYLANTA) 200-200-20 MG/5ML suspension 30 mL  30 mL Oral Q4H PRN Laveda Abbe, NP      . carbamazepine (TEGRETOL) tablet 200 mg  200 mg Oral TID Laveda Abbe, NP   200 mg at 10/11/16 1209  . citalopram (CELEXA) tablet 10 mg  10 mg Oral Daily Oneta Rack, NP   10 mg at 10/11/16 0815  . cloNIDine (CATAPRES) tablet 0.1 mg  0.1 mg Oral QID Laveda Abbe, NP   0.1 mg at 10/11/16 1209   Followed by  . [START ON 10/12/2016] cloNIDine (CATAPRES) tablet 0.1 mg  0.1 mg Oral BH-qamhs Laveda Abbe, NP       Followed by  . [START ON 10/15/2016] cloNIDine (CATAPRES) tablet 0.1 mg  0.1 mg Oral QAC breakfast Laveda Abbe, NP      . dicyclomine (BENTYL) tablet 20 mg  20 mg Oral Q6H PRN Laveda Abbe, NP   20 mg at 10/11/16 1610  . gabapentin (NEURONTIN) capsule 600 mg  600 mg Oral TID Laveda Abbe, NP   600 mg at 10/11/16 1209  . hydrOXYzine (ATARAX/VISTARIL) tablet 50 mg  50 mg Oral Q6H PRN Laveda Abbe, NP   50 mg at 10/11/16 0050  . levothyroxine (SYNTHROID, LEVOTHROID) tablet 50 mcg  50 mcg Oral QAC breakfast Laveda Abbe, NP   50 mcg at 10/11/16 9604  . loperamide (IMODIUM) capsule 2-4 mg  2-4 mg Oral PRN Laveda Abbe, NP   2 mg at 10/11/16 0945  . magnesium hydroxide (MILK OF MAGNESIA) suspension 30 mL  30 mL Oral Daily PRN Laveda Abbe, NP      . methocarbamol (ROBAXIN) tablet 500 mg  500 mg Oral Q8H PRN Laveda Abbe, NP   500 mg at 10/11/16 5409  . mirtazapine (REMERON) tablet 30 mg  30 mg Oral QHS Laveda Abbe, NP   30 mg at 10/10/16 2124  . naproxen (NAPROSYN) tablet 500 mg  500 mg Oral BID PRN Laveda Abbe, NP   500 mg at 10/11/16 0050  . nicotine polacrilex (NICORETTE) gum 2 mg  2 mg Oral PRN Cobos, Rockey Situ, MD   2 mg at 10/11/16 0834  . ondansetron (ZOFRAN-ODT) disintegrating tablet 4 mg  4 mg Oral Q6H PRN  Laveda Abbe, NP   4 mg at 10/11/16 0050  . QUEtiapine (SEROQUEL) tablet 400 mg  400 mg Oral QHS Laveda Abbe, NP   400 mg at 10/10/16 2125    Lab Results:  Results for orders placed or performed during the hospital encounter of 10/09/16 (from the past 48 hour(s))  GC/Chlamydia probe amp ([place])not at [facility]     Status: None   Collection Time: 10/10/16 12:00 AM  Result Value Ref Range   Chlamydia Negative  Comment: Normal Reference Range - Negative   Neisseria gonorrhea Negative     Comment: Normal Reference Range - Negative  HIV antibody     Status: None   Collection Time: 10/10/16  6:20 PM  Result Value Ref Range   HIV Screen 4th Generation wRfx Non Reactive Non Reactive    Comment: (NOTE) Performed At: [facility] [address] [place], [place] [id number] Mila Homer MD [id number] Performed at [facility], [address], [place], [place] [address]   RPR     Status: None   Collection Time: 10/10/16  6:20 PM  Result Value Ref Range   RPR Ser Ql Non Reactive Non Reactive    Comment: (NOTE) Performed At: [facility] [address] [place], [place] [id number] Mila Homer MD [id number] Performed at [facility], [address], [place], [place] [address]     Blood Alcohol level:  Lab Results  Component Value Date   [facility] <5 10/07/2016   ETH <5 05/31/2016    Metabolic Disorder Labs: Lab Results  Component Value Date   HGBA1C 4.9 06/07/2016   MPG 94 06/07/2016   MPG 117 (H) 03/05/2014   Lab Results  Component Value Date   PROLACTIN 49.9 (H) 06/07/2016   Lab Results  Component Value Date   CHOL 173 06/07/2016   TRIG 152 (H) 06/07/2016   HDL 67 06/07/2016   CHOLHDL 2.6 06/07/2016   VLDL 30 06/07/2016   LDLCALC 76 06/07/2016   LDLCALC 130 (H) 03/05/2014    Physical Findings: AIMS: Facial and Oral Movements Muscles of Facial Expression: None, normal Lips and  Perioral Area: None, normal Jaw: None, normal Tongue: None, normal,Extremity Movements Upper (arms, wrists, hands, fingers): None, normal Lower (legs, knees, ankles, toes): None, normal, Trunk Movements Neck, shoulders, hips: None, normal, Overall Severity Severity of abnormal movements (highest score from questions above): None, normal Incapacitation due to abnormal movements: None, normal Patient's awareness of abnormal movements (rate only patient's report): No Awareness, Dental Status Current problems with teeth and/or dentures?: No Does patient usually wear dentures?: No  CIWA:    COWS:  COWS Total Score: 6  Musculoskeletal: Strength & Muscle Tone: within normal limits Gait & Station: normal Patient leans: N/A  Psychiatric Specialty Exam: Physical Exam  Vitals reviewed. Constitutional: She appears lethargic.  Neurological: She appears lethargic.  Psychiatric: Her speech is slurred. She is slowed and withdrawn. Thought content is not paranoid. She expresses no homicidal and no suicidal ideation.    Review of Systems  Psychiatric/Behavioral: Positive for substance abuse.    Blood pressure 103/75, pulse 93, temperature 98.2 F (36.8 C), temperature source Oral, resp. rate 18, height 5\' 3"  (1.6 m), weight 65.3 kg (144 lb), SpO2 100 %.Body mass index is 25.51 kg/m.  General Appearance: Disheveled  Eye Contact:  Fair  Speech:  Garbled and Slurred  Volume:  Normal  Mood:  Depressed  Affect:  Flat  Thought Process:  Coherent  Orientation:  Full (Time, Place, and Person)  Thought Content:  Rumination  Suicidal Thoughts:  No  Homicidal Thoughts:  No  Memory:  Immediate;   Fair Recent;   Fair Remote;   Fair  Judgement:  Poor  Insight:  Lacking  Psychomotor Activity:  Decreased  Concentration:  Concentration: Fair and Attention Span: Fair  Recall:  [employer] of Knowledge:  Fair  Language:  Negative  Akathisia:  No  Handed:  Right  AIMS (if indicated):  Assets:   Resilience  ADL's:  Intact  Cognition:  WNL  Sleep:  Number of Hours: 2   Treatment Plan Summary: Review of chart, vital signs, medications, and notes.  1-Individual and group therapy  2-Medication management for depression and anxiety: Medications reviewed with the patient and she stated no untoward effects, unchanged.  3-Coping skills for depression, anxiety  4-Continue crisis stabilization and management  5-Address health issues--monitoring vital signs, stable  6-Treatment plan in progress to prevent relapse of depression and anxiety  Lindwood Qua, NP [facility] 10/11/2016, 3:46 PM

## 2016-10-11 NOTE — Progress Notes (Signed)
Adult Psychoeducational Group Note  Date:  10/11/2016 Time:  9:51 PM  Group Topic/Focus:  Wrap-Up Group:   The focus of this group is to help patients review their daily goal of treatment and discuss progress on daily workbooks.  Participation Level:  Minimal  Participation Quality:  Inattentive  Affect:  Flat and Resistant  Cognitive:  Lacking  Insight: None  Engagement in Group:  Engaged  Modes of Intervention:  Discussion  Additional Comments:  Pt came to group, however left during the middle to get a blanket and returned to group. When writer prompt pt for her goal and how her day was, pt refused to participated. Pt was encouraged to make her needs known to staff.  [name], Abbe Bula C 10/11/2016, 9:51 PM

## 2016-10-12 DIAGNOSIS — G4700 Insomnia, unspecified: Secondary | ICD-10-CM

## 2016-10-12 DIAGNOSIS — N390 Urinary tract infection, site not specified: Secondary | ICD-10-CM

## 2016-10-12 DIAGNOSIS — E039 Hypothyroidism, unspecified: Secondary | ICD-10-CM

## 2016-10-12 DIAGNOSIS — F419 Anxiety disorder, unspecified: Secondary | ICD-10-CM

## 2016-10-12 LAB — HSV 2 ANTIBODY, IGG: HSV 2 Glycoprotein G Ab, IgG: 10.8 index — ABNORMAL HIGH (ref 0.00–0.90)

## 2016-10-12 MED ORDER — CEPHALEXIN 500 MG PO CAPS
500.0000 mg | ORAL_CAPSULE | Freq: Two times a day (BID) | ORAL | Status: DC
  Administered 2016-10-12 – 2016-10-18 (×13): 500 mg via ORAL
  Filled 2016-10-12 (×8): qty 1
  Filled 2016-10-12: qty 2
  Filled 2016-10-12 (×2): qty 1
  Filled 2016-10-12: qty 2
  Filled 2016-10-12 (×4): qty 1

## 2016-10-12 MED ORDER — SERTRALINE HCL 25 MG PO TABS
25.0000 mg | ORAL_TABLET | Freq: Every day | ORAL | Status: DC
  Administered 2016-10-12 – 2016-10-14 (×3): 25 mg via ORAL
  Filled 2016-10-12 (×5): qty 1

## 2016-10-12 NOTE — Progress Notes (Signed)
Adult Psychoeducational Group Note  Date:  10/12/2016 Time:  9:26 PM  Group Topic/Focus:  Wrap-Up Group:   The focus of this group is to help patients review their daily goal of treatment and discuss progress on daily workbooks.  Participation Level:  Did not attend  Additional Comments:  Pt was invited, however did not come into group until group was over. Pt was out walking in the hallway during group.  [name], Rakeya Glab C 10/12/2016, 9:26 PM

## 2016-10-12 NOTE — Progress Notes (Signed)
Recreation Therapy Notes  Date: 10/12/16 Time: 0930 Location: 300 Hall Dayroom  Group Topic: Stress Management  Goal Area(s) Addresses:  Patient will verbalize importance of using healthy stress management.  Patient will identify positive emotions associated with healthy stress management.   Intervention: Stress Management  Activity :  Body Scan Meditation.  LRT introduced the stress management technique of meditation.  LRT played a meditation to allow patients to take inventory of the sensations they are feeling in their body.  Patients were to follow along as the meditation was played to fully engage in the technique.  Education:  Stress Management, Discharge Planning.   Education Outcome: Acknowledges edcuation/In group clarification offered/Needs additional education  Clinical Observations/Feedback: Pt did not attend group.   Becky RancherMarjette Rose-Marie [name], LRT/CTRS         Becky RancherLindsay, Taijon Vink A 10/12/2016 11:36 AM

## 2016-10-12 NOTE — Progress Notes (Signed)
DAR NOTE: Pt present with flat affect and depressed mood. Pt has been observed in the dayroom interacting with peers. Pt complained of stomach pain and acid reflux, took all her meds as scheduled. As per self inventory, pt had a good night sleep, good appetite, normal energy, and good concentration. Pt rate depression at 6, hopeless ness at 8, and anxiety at 9. Pt's safety ensured with 15 minute and environmental checks. Pt currently denies SI/HI and A/V hallucinations. Pt verbally agrees to seek staff if SI/HI or A/VH occurs and to consult with staff before acting on these thoughts. Will continue POC.

## 2016-10-12 NOTE — [facility] Group Notes (Signed)
[facility] LCSW Group Therapy 10/12/2016 1:15 PM  Type of Therapy: Group Therapy- Emotion Regulation  Participation Level: Active   Participation Quality:  Appropriate  Affect: Appropriate  Cognitive: Alert and Oriented   Insight:  Developing/Improving  Engagement in Therapy: Developing/Improving and Engaged   Modes of Intervention: Clarification, Confrontation, Discussion, Education, Exploration, Limit-setting, Orientation, Problem-solving, Rapport Building, Dance movement psychotherapisteality Testing, Socialization and Support  Summary of Progress/Problems: The topic for group today was emotional regulation. This group focused on both positive and negative emotion identification and allowed group members to process ways to identify feelings, regulate negative emotions, and find healthy ways to manage internal/external emotions. Group members were asked to reflect on a time when their reaction to an emotion led to a negative outcome and explored how alternative responses using emotion regulation would have benefited them. Group members were also asked to discuss a time when emotion regulation was utilized when a negative emotion was experienced.   Becky ShanksLauren Esley Brooking, LCSW 10/12/2016 4:02 PM

## 2016-10-12 NOTE — Progress Notes (Signed)
[facility]  10/12/2016 10:26 AM Ambrose PancoastDanielle Slevin  MRN:  [id number]   Subjective:  "I'm doing really well, kinda depressed but not suicidal today."  Objective: Pt seen and chart reviewed. Pt is alert/oriented x4, calm, cooperative, and appropriate to situation. Pt denies suicidal/homicidal ideation and psychosis and does not appear to be responding to internal stimuli. Pt is minimizing her overdose and has a history of doing so. Pt is also asking for numerous medication changes. I reassured her that we must make changes carefully and slowly so that we know what medications are having which effects in her body. Pt understanding but asking for more Seroquel. Informed pt that we will not change that medicine at this time due to her overdose.   Principal Problem: Bipolar 1 disorder, mixed, severe (HCC) Diagnosis:   Patient Active Problem List   Diagnosis Date Noted  . Bipolar 1 disorder, mixed, severe (HCC) [F31.63] 10/09/2016  . Opioid use disorder, moderate, dependence (HCC) [F11.20] 06/06/2016  . Sedative, hypnotic or anxiolytic use disorder, severe, dependence (HCC) [F13.20] 06/06/2016  . Cocaine use disorder, severe, dependence (HCC) [F14.20] 06/06/2016  . Cannabis use disorder, severe, dependence (HCC) [F12.20] 06/06/2016  . MDD (major depressive disorder), recurrent severe, without psychosis (HCC) [F33.2] 06/03/2016  . Bipolar I disorder, most recent episode depressed (HCC) [F31.30] 07/04/2015  . Overdose [T50.901A] 07/02/2015  . Lactic acidosis [E87.2] 07/02/2015  . Acute respiratory failure (HCC) [J96.00] 07/02/2015  . Sepsis (HCC) [A41.9] 07/02/2015  . AKI (acute kidney injury) (HCC) [N17.9]   . Altered mental status [R41.82]   . Pyrexia [R50.9]   . Abdominal pain, epigastric [R10.13] 06/17/2015  . Hepatitis C antibody test positive [R76.8] 06/17/2015  . Peptic ulcer disease [K27.9] 06/17/2015  . Epigastric pain [R10.13]   . Transaminitis [R74.0] 06/16/2015  . History  of substance abuse [Z87.898] 06/16/2015  . Bipolar affective disorder, current episode manic with psychotic symptoms (HCC) [F31.2] 03/03/2014   Total Time spent with patient: 30 minutes  Past Psychiatric History: see HPI  Past Medical History:  Past Medical History:  Diagnosis Date  . Bipolar 1 disorder (HCC)   . Bronchitis   . GAD (generalized anxiety disorder)   . Hepatitis C antibody test positive 06/17/2015  . History of substance abuse 06/16/2015  . OCD (obsessive compulsive disorder)   . Peptic ulcer   . Polysubstance abuse   . PTSD (post-traumatic stress disorder)     Past Surgical History:  Procedure Laterality Date  . OTHER SURGICAL HISTORY     scar tissue removed from right ovary   . OTHER SURGICAL HISTORY     fallopian tube repair  . WISDOM TOOTH EXTRACTION     Family History:  Family History  Problem Relation Age of Onset  . Drug abuse Brother   . Suicidality Brother   . Cancer Other   . Depression Mother   . Drug abuse Mother   . Alcoholism Father   . Bipolar disorder Cousin    Family Psychiatric  History: see HPI Social History:  History  Alcohol Use  . Yes    Comment: occ     History  Drug Use  . Types: Marijuana, Methamphetamines, MDMA (Ecstacy), Oxycodone, Benzodiazepines, Cocaine    Social History   Social History  . Marital status: Legally Separated    Spouse name: N/A  . Number of children: N/A  . Years of education: N/A   Social History Main Topics  . Smoking status: Current Every Day Smoker  Packs/day: 1.00    Years: 10.00    Types: Cigarettes  . Smokeless tobacco: Never Used  . Alcohol use Yes     Comment: occ  . Drug use: Yes    Types: Marijuana, Methamphetamines, MDMA (Ecstacy), Oxycodone, Benzodiazepines, Cocaine  . Sexual activity: Yes    Birth control/ protection: None   Other Topics Concern  . None   Social History Narrative  . None   Additional Social History:   Sleep: Good  Appetite:  Good  Current  Medications: Current Facility-Administered Medications  Medication Dose Route Frequency Provider Last Rate Last Dose  . acetaminophen (TYLENOL) tablet 650 mg  650 mg Oral Q6H PRN Laveda Abbe, NP   650 mg at 10/12/16 0421  . alum & mag hydroxide-simeth (MAALOX/MYLANTA) 200-200-20 MG/5ML suspension 30 mL  30 mL Oral Q4H PRN Laveda Abbe, NP   30 mL at 10/12/16 0811  . carbamazepine (TEGRETOL) tablet 200 mg  200 mg Oral TID Laveda Abbe, NP   200 mg at 10/12/16 0809  . citalopram (CELEXA) tablet 10 mg  10 mg Oral Daily Oneta Rack, NP   10 mg at 10/12/16 0809  . cloNIDine (CATAPRES) tablet 0.1 mg  0.1 mg Oral BH-qamhs Laveda Abbe, NP       Followed by  . [START ON 10/15/2016] cloNIDine (CATAPRES) tablet 0.1 mg  0.1 mg Oral QAC breakfast Laveda Abbe, NP      . dicyclomine (BENTYL) tablet 20 mg  20 mg Oral Q6H PRN Laveda Abbe, NP   20 mg at 10/12/16 [phone]  . gabapentin (NEURONTIN) capsule 600 mg  600 mg Oral TID Laveda Abbe, NP   600 mg at 10/12/16 0810  . hydrOXYzine (ATARAX/VISTARIL) tablet 50 mg  50 mg Oral Q6H PRN Laveda Abbe, NP   50 mg at 10/11/16 2358  . levothyroxine (SYNTHROID, LEVOTHROID) tablet 50 mcg  50 mcg Oral QAC breakfast Laveda Abbe, NP   50 mcg at 10/12/16 [phone]  . loperamide (IMODIUM) capsule 2-4 mg  2-4 mg Oral PRN Laveda Abbe, NP   2 mg at 10/11/16 2006  . magnesium hydroxide (MILK OF MAGNESIA) suspension 30 mL  30 mL Oral Daily PRN Laveda Abbe, NP      . methocarbamol (ROBAXIN) tablet 500 mg  500 mg Oral Q8H PRN Laveda Abbe, NP   500 mg at 10/11/16 2358  . mirtazapine (REMERON) tablet 30 mg  30 mg Oral QHS Laveda Abbe, NP   30 mg at 10/11/16 2358  . naproxen (NAPROSYN) tablet 500 mg  500 mg Oral BID PRN Laveda Abbe, NP   500 mg at 10/11/16 2358  . nicotine polacrilex (NICORETTE) gum 2 mg  2 mg Oral PRN Cobos, Rockey Situ, MD   2 mg at 10/12/16  0617  . ondansetron (ZOFRAN-ODT) disintegrating tablet 4 mg  4 mg Oral Q6H PRN Laveda Abbe, NP   4 mg at 10/11/16 0050  . QUEtiapine (SEROQUEL) tablet 400 mg  400 mg Oral QHS Laveda Abbe, NP   400 mg at 10/11/16 2358    Lab Results:  Results for orders placed or performed during the hospital encounter of 10/09/16 (from the past 48 hour(s))  HSV 2 antibody, IgG     Status: Abnormal   Collection Time: 10/10/16  6:20 PM  Result Value Ref Range   HSV 2 Glycoprotein G Ab, IgG 10.80 (H) 0.00 - 0.90 index  Comment: (NOTE)                                 Negative        <0.91                                 Equivocal 0.91 - 1.09                                 Positive        >1.09 Note: Negative indicates no antibodies detected to HSV-2. Equivocal may suggest early infection.  If clinically appropriate, retest at later date. 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 Performed At: [facility] [address] [place], [place] [id number] Mila Homer MD [id number] Performed at [facility], [address], [place], [place] [address]   HIV antibody     Status: None   Collection Time: 10/10/16  6:20 PM  Result Value Ref Range   HIV Screen 4th Generation wRfx Non Reactive Non Reactive    Comment: (NOTE) Performed At: [facility] [address] [place], [place] [id number] Mila Homer MD [id number] Performed at [facility], [address], [place], [place] [address]   RPR     Status: None   Collection Time: 10/10/16  6:20 PM  Result Value Ref Range   RPR Ser Ql Non Reactive Non Reactive    Comment: (NOTE) Performed At: [facility] [address] [place], [place] [id number] Mila Homer MD [id number] Performed at [facility], [address], [place], [place] [address]   Urine culture     Status: Abnormal (Preliminary result)   Collection Time:  10/10/16  6:35 PM  Result Value Ref Range   Specimen Description      URINE, RANDOM Performed at [facility], [address] [place], [place] [address]    Special Requests      NONE Performed at [facility], [address] [place], [place] [address]    Culture >=100,000 COLONIES/mL GRAM NEGATIVE RODS (A)    Report Status PENDING   Pregnancy, urine     Status: None   Collection Time: 10/11/16  3:07 PM  Result Value Ref Range   Preg Test, Ur NEGATIVE NEGATIVE    Comment:        THE SENSITIVITY OF THIS METHODOLOGY IS >20 mIU/mL. Performed at [facility], [address], [place], [place] [address]     Blood Alcohol level:  Lab Results  Component Value Date   [facility] <5 10/07/2016   ETH <5 05/31/2016    Metabolic Disorder Labs: Lab Results  Component Value Date   HGBA1C 4.9 06/07/2016   MPG 94 06/07/2016   MPG 117 (H) 03/05/2014   Lab Results  Component Value Date   PROLACTIN 49.9 (H) 06/07/2016   Lab Results  Component Value Date   CHOL 173 06/07/2016   TRIG 152 (H) 06/07/2016   HDL 67 06/07/2016   CHOLHDL 2.6 06/07/2016   VLDL 30 06/07/2016   LDLCALC 76 06/07/2016   LDLCALC 130 (H) 03/05/2014    Physical Findings: AIMS: Facial and Oral Movements Muscles of Facial Expression: None, normal Lips and Perioral Area: None, normal Jaw: None, normal Tongue: None, normal,Extremity  Movements Upper (arms, wrists, hands, fingers): None, normal Lower (legs, knees, ankles, toes): None, normal, Trunk Movements Neck, shoulders, hips: None, normal, Overall Severity Severity of abnormal movements (highest score from questions above): None, normal Incapacitation due to abnormal movements: None, normal Patient's awareness of abnormal movements (rate only patient's report): No Awareness, Dental Status Current problems with teeth and/or dentures?: No Does patient usually wear dentures?: No  CIWA:    COWS:  COWS Total  Score: 6  Musculoskeletal: Strength & Muscle Tone: within normal limits Gait & Station: normal Patient leans: N/A  Psychiatric Specialty Exam: Physical Exam  Vitals reviewed. Constitutional: She appears lethargic.  Neurological: She appears lethargic.    Review of Systems  Psychiatric/Behavioral: Positive for depression and substance abuse. Negative for hallucinations and suicidal ideas. The patient is nervous/anxious and has insomnia.     Blood pressure 130/73, pulse (!) 105, temperature 97.8 F (36.6 C), temperature source Oral, resp. rate 16, height 5\' 3"  (1.6 m), weight 65.3 kg (144 lb), SpO2 100 %.Body mass index is 25.51 kg/m.  General Appearance: Casual and Fairly Groomed  Eye Contact:  Good  Speech:  Clear and Coherent and Normal Rate  Volume:  Normal  Mood:  Anxious and Depressed  Affect:  Appropriate, Congruent and Depressed  Thought Process:  Coherent  Orientation:  Full (Time, Place, and Person)  Thought Content:  Focused on medication changes  Suicidal Thoughts:  No  Homicidal Thoughts:  No  Memory:  Immediate;   Fair Recent;   Fair Remote;   Fair  Judgement:  Poor  Insight:  Lacking  Psychomotor Activity:  Decreased  Concentration:  Concentration: Fair and Attention Span: Fair  Recall:  [employer] of Knowledge:  Fair  Language:  Negative  Akathisia:  No  Handed:  Right  AIMS (if indicated):     Assets:  Resilience  ADL's:  Intact  Cognition:  WNL  Sleep:  Number of Hours: 6   Medications:  -Continue tegretol 200mg  po tid for mood stabilization  -Start Keflex 500mg  po bid x 7 days for UTI (culture back gram neg rods)  -Discontinue home Celexa (pt reports this has not worked for months) -Start Zoloft 25mg  po daily for depression with plan to titrate to 50mg  if tolerated -Continue Clonidine/COWS opiate detox plan  -Continue home Synthroid for hypothyroidism qam  -Continue Seroquel 400mg  po qhs for mood lability  -Continue Nicotine patch   -Continue Remeron 30mg  po qhs for insomnia/depression adjuvant therapy  -Continue Neurontin 600mg  po tid for anxiety   Beau Fanny, FNP [facility] 10/12/2016, 10:26 AM   Reviewed the information documented and agree with the treatment plan.  Carlee Vonderhaar 10/13/2016 11:26 AM

## 2016-10-13 LAB — URINE CULTURE: Culture: >=100000 — AB

## 2016-10-13 MED ORDER — TRAZODONE HCL 100 MG PO TABS
100.0000 mg | ORAL_TABLET | Freq: Every evening | ORAL | Status: DC | PRN
  Administered 2016-10-13: 100 mg via ORAL
  Filled 2016-10-13: qty 1

## 2016-10-13 MED ORDER — QUETIAPINE FUMARATE ER 50 MG PO TB24
100.0000 mg | ORAL_TABLET | Freq: Two times a day (BID) | ORAL | Status: DC
  Administered 2016-10-13 – 2016-10-14 (×2): 100 mg via ORAL
  Filled 2016-10-13 (×4): qty 2

## 2016-10-13 NOTE — Progress Notes (Signed)
DAR NOTE: Patient presents with irritable affect and anxious mood.  Mood remained labile at times.  Denies auditory and visual hallucinations.  Reports withdrawal symptoms of tremors, diarrhea, cravings, agitation, nausea, and irritability on self inventory form.  Described energy level as normal and concentration as poor.  Rates depression at 7, hopelessness at 6, and anxiety at .  Maintained on routine safety checks.  Medications given as prescribed.  Support and encouragement offered as needed.  Attended group and participated.  States goal for today is "getting medication straight."  Patient remained withdrawn and isolative most of this shift.  minimal interaction with staff and peers.  Patient on antibiotic therapy for UTI.  Fluid intake encouraged and offered.  Robaxin and Bentyl given for muscle cramps and abdominal discomfort with good effect.

## 2016-10-13 NOTE — [facility] Group Notes (Signed)
[facility] LCSW Group Therapy 10/13/2016 1:15pm  Type of Therapy: Group Therapy- Balance in Life  Participation Level: Active   Description of the Group:  The topic for group was balance in life. Today's group focused on defining balance in one's own words, identifying things that can knock one off balance, and exploring healthy ways to maintain balance in life. Group members were asked to provide an example of a time when they felt off balance, describe how they handled that situation,and process healthier ways to regain balance in the future. Group members were asked to share the most important tool for maintaining balance that they [facility] and how they plan to apply this method after discharge.   Therapeutic Modalities:   Cognitive Behavioral Therapy Solution-Focused Therapy Assertiveness Training   Becky JordanLynn B Gunhild Bautch, MSW, LCSWA 10/13/2016 4:06 PM

## 2016-10-13 NOTE — Progress Notes (Signed)
[facility]  10/13/2016 2:33 PM Becky PancoastDanielle [name]  MRN:  [id number] Subjective:  I'm still struggling with withdrawal symptoms, Seroquel has helped me in the past but I am only getting it at night. I still have some queasiness.  Patient is a 26 year old female transferred from [facility] for heroin overdose, has a diagnosis of PTSD, bipolar disorder, mixed, severe.  Patient reports that she continues to be anxious, depressed, struggles with substance use. She adds that she wants to get better, feels she needs long-term treatment for her addiction. She states that she is unable to keep herself clean and knows that she has a problem and needs help.  Patient states that she's not sure if she can keep herself safe if discharged as she continues to struggle with her mood, addiction, feels she needs help. Ex  Patient also reports that she struggling with her sleep, needs trazodone to fall asleep at night. She states that she is working on her coping skills, trying to attend NA meetings, knows that she needs to turn her life around. Principal Problem: Bipolar 1 disorder, mixed, severe (HCC) Diagnosis:   Patient Active Problem List   Diagnosis Date Noted  . Bipolar 1 disorder, mixed, severe (HCC) [F31.63] 10/09/2016  . Opioid use disorder, moderate, dependence (HCC) [F11.20] 06/06/2016  . Sedative, hypnotic or anxiolytic use disorder, severe, dependence (HCC) [F13.20] 06/06/2016  . Cocaine use disorder, severe, dependence (HCC) [F14.20] 06/06/2016  . Cannabis use disorder, severe, dependence (HCC) [F12.20] 06/06/2016  . MDD (major depressive disorder), recurrent severe, without psychosis (HCC) [F33.2] 06/03/2016  . Bipolar I disorder, most recent episode depressed (HCC) [F31.30] 07/04/2015  . Overdose [T50.901A] 07/02/2015  . Lactic acidosis [E87.2] 07/02/2015  . Acute respiratory failure (HCC) [J96.00] 07/02/2015  . Sepsis (HCC) [A41.9] 07/02/2015  . AKI (acute kidney injury)  (HCC) [N17.9]   . Altered mental status [R41.82]   . Pyrexia [R50.9]   . Abdominal pain, epigastric [R10.13] 06/17/2015  . Hepatitis C antibody test positive [R76.8] 06/17/2015  . Peptic ulcer disease [K27.9] 06/17/2015  . Epigastric pain [R10.13]   . Transaminitis [R74.0] 06/16/2015  . History of substance abuse [Z87.898] 06/16/2015  . Bipolar affective disorder, current episode manic with psychotic symptoms (HCC) [F31.2] 03/03/2014   Total Time spent with patient: 30 minutes  Past Psychiatric History: see H&P , unchanged  Past Medical History:  Past Medical History:  Diagnosis Date  . Bipolar 1 disorder (HCC)   . Bronchitis   . GAD (generalized anxiety disorder)   . Hepatitis C antibody test positive 06/17/2015  . History of substance abuse 06/16/2015  . OCD (obsessive compulsive disorder)   . Peptic ulcer   . Polysubstance abuse   . PTSD (post-traumatic stress disorder)     Past Surgical History:  Procedure Laterality Date  . OTHER SURGICAL HISTORY     scar tissue removed from right ovary   . OTHER SURGICAL HISTORY     fallopian tube repair  . WISDOM TOOTH EXTRACTION     Family History:  Family History  Problem Relation Age of Onset  . Drug abuse Brother   . Suicidality Brother   . Cancer Other   . Depression Mother   . Drug abuse Mother   . Alcoholism Father   . Bipolar disorder Cousin    Family Psychiatric  History: see H&P, unchanged Social History:  History  Alcohol Use  . Yes    Comment: occ     History  Drug Use  .  Types: Marijuana, Methamphetamines, MDMA (Ecstacy), Oxycodone, Benzodiazepines, Cocaine    Social History   Social History  . Marital status: Legally Separated    Spouse name: N/A  . Number of children: N/A  . Years of education: N/A   Social History Main Topics  . Smoking status: Current Every Day Smoker    Packs/day: 1.00    Years: 10.00    Types: Cigarettes  . Smokeless tobacco: Never Used  . Alcohol use Yes     Comment: occ   . Drug use: Yes    Types: Marijuana, Methamphetamines, MDMA (Ecstacy), Oxycodone, Benzodiazepines, Cocaine  . Sexual activity: Yes    Birth control/ protection: None   Other Topics Concern  . None   Social History Narrative  . None   Additional Social History:                         Sleep: Poor  Appetite:  Fair  Current Medications: Current Facility-Administered Medications  Medication Dose Route Frequency Provider Last Rate Last Dose  . acetaminophen (TYLENOL) tablet 650 mg  650 mg Oral Q6H PRN Laveda Abbe, NP   650 mg at 10/12/16 0421  . alum & mag hydroxide-simeth (MAALOX/MYLANTA) 200-200-20 MG/5ML suspension 30 mL  30 mL Oral Q4H PRN Laveda Abbe, NP   30 mL at 10/13/16 0417  . carbamazepine (TEGRETOL) tablet 200 mg  200 mg Oral TID Laveda Abbe, NP   200 mg at 10/13/16 1203  . cephALEXin (KEFLEX) capsule 500 mg  500 mg Oral Q12H Withrow, John C, FNP   500 mg at 10/13/16 0830  . cloNIDine (CATAPRES) tablet 0.1 mg  0.1 mg Oral BH-qamhs Laveda Abbe, NP   0.1 mg at 10/13/16 0830   Followed by  . [START ON 10/15/2016] cloNIDine (CATAPRES) tablet 0.1 mg  0.1 mg Oral QAC breakfast Laveda Abbe, NP      . dicyclomine (BENTYL) tablet 20 mg  20 mg Oral Q6H PRN Laveda Abbe, NP   20 mg at 10/13/16 1207  . gabapentin (NEURONTIN) capsule 600 mg  600 mg Oral TID Laveda Abbe, NP   600 mg at 10/13/16 1203  . hydrOXYzine (ATARAX/VISTARIL) tablet 50 mg  50 mg Oral Q6H PRN Laveda Abbe, NP   50 mg at 10/12/16 2128  . levothyroxine (SYNTHROID, LEVOTHROID) tablet 50 mcg  50 mcg Oral QAC breakfast Laveda Abbe, NP   50 mcg at 10/13/16 6962  . loperamide (IMODIUM) capsule 2-4 mg  2-4 mg Oral PRN Laveda Abbe, NP   2 mg at 10/13/16 1203  . magnesium hydroxide (MILK OF MAGNESIA) suspension 30 mL  30 mL Oral Daily PRN Laveda Abbe, NP      . methocarbamol (ROBAXIN) tablet 500 mg  500 mg Oral  Q8H PRN Laveda Abbe, NP   500 mg at 10/13/16 9528  . mirtazapine (REMERON) tablet 30 mg  30 mg Oral QHS Laveda Abbe, NP   30 mg at 10/12/16 2126  . naproxen (NAPROSYN) tablet 500 mg  500 mg Oral BID PRN Laveda Abbe, NP   500 mg at 10/12/16 2130  . nicotine polacrilex (NICORETTE) gum 2 mg  2 mg Oral PRN Cobos, Rockey Situ, MD   2 mg at 10/13/16 1401  . ondansetron (ZOFRAN-ODT) disintegrating tablet 4 mg  4 mg Oral Q6H PRN Laveda Abbe, NP   4 mg at 10/12/16 2131  . QUEtiapine (  SEROQUEL XR) 24 hr tablet 100 mg  100 mg Oral BID Nelly Rout, MD      . QUEtiapine (SEROQUEL) tablet 400 mg  400 mg Oral QHS Laveda Abbe, NP   400 mg at 10/12/16 2126  . sertraline (ZOLOFT) tablet 25 mg  25 mg Oral Daily Withrow, Everardo All, FNP   25 mg at 10/13/16 0830    Lab Results:  Results for orders placed or performed during the hospital encounter of 10/09/16 (from the past 48 hour(s))  Pregnancy, urine     Status: None   Collection Time: 10/11/16  3:07 PM  Result Value Ref Range   Preg Test, Ur NEGATIVE NEGATIVE    Comment:        THE SENSITIVITY OF THIS METHODOLOGY IS >20 mIU/mL. Performed at [facility], [address] [place], [place] [address]     Blood Alcohol level:  Lab Results  Component Value Date   [facility] <5 10/07/2016   ETH <5 05/31/2016    Metabolic Disorder Labs: Lab Results  Component Value Date   HGBA1C 4.9 06/07/2016   MPG 94 06/07/2016   MPG 117 (H) 03/05/2014   Lab Results  Component Value Date   PROLACTIN 49.9 (H) 06/07/2016   Lab Results  Component Value Date   CHOL 173 06/07/2016   TRIG 152 (H) 06/07/2016   HDL 67 06/07/2016   CHOLHDL 2.6 06/07/2016   VLDL 30 06/07/2016   LDLCALC 76 06/07/2016   LDLCALC 130 (H) 03/05/2014    Physical Findings: AIMS: Facial and Oral Movements Muscles of Facial Expression: None, normal Lips and Perioral Area: None, normal Jaw: None, normal Tongue: None,  normal,Extremity Movements Upper (arms, wrists, hands, fingers): None, normal Lower (legs, knees, ankles, toes): None, normal, Trunk Movements Neck, shoulders, hips: None, normal, Overall Severity Severity of abnormal movements (highest score from questions above): None, normal Incapacitation due to abnormal movements: None, normal Patient's awareness of abnormal movements (rate only patient's report): No Awareness, Dental Status Current problems with teeth and/or dentures?: No Does patient usually wear dentures?: No  CIWA:    COWS:  COWS Total Score: 5  Musculoskeletal: Strength & Muscle Tone: within normal limits Gait & Station: normal Patient leans: N/A  Psychiatric Specialty Exam: Physical Exam  Review of Systems  Constitutional: Negative.  Negative for fever, malaise/fatigue and weight loss.  HENT: Negative.  Negative for congestion, hearing loss and sore throat.   Eyes: Negative.  Negative for blurred vision and discharge.  Respiratory: Negative.  Negative for cough, shortness of breath and wheezing.   Cardiovascular: Negative.  Negative for chest pain and palpitations.  Gastrointestinal: Negative.  Negative for abdominal pain, heartburn, nausea and vomiting.  Genitourinary: Negative.  Negative for dysuria.  Musculoskeletal: Negative.  Negative for falls and myalgias.  Skin: Negative.  Negative for rash.  Neurological: Negative.  Negative for dizziness, seizures and loss of consciousness.  Endo/Heme/Allergies: Negative.  Negative for environmental allergies.  Psychiatric/Behavioral: Positive for depression, substance abuse and suicidal ideas. Negative for hallucinations and memory loss. The patient is nervous/anxious. The patient does not have insomnia.     Blood pressure 116/64, pulse 83, temperature 97.8 F (36.6 C), temperature source Oral, resp. rate 16, height 5\' 3"  (1.6 m), weight 65.3 kg (144 lb), SpO2 100 %.Body mass index is 25.51 kg/m.  General Appearance: Casual   Eye Contact:  Fair  Speech:  Clear and Coherent and Normal Rate  Volume:  Normal  Mood:  Anxious, Depressed and Dysphoric  Affect:  Congruent and Labile  Thought Process:  Coherent, Goal Directed and Descriptions of Associations: Intact  Orientation:  Full (Time, Place, and Person)  Thought Content:  Logical  Suicidal Thoughts:  Yes.  without intent/plan, if discharged as she cannot keep herself safe outside the hospital  Homicidal Thoughts:  No  Memory:  Immediate;   Fair Recent;   Fair Remote;   Fair  Judgement:  Impaired  Insight:  Present  Psychomotor Activity:  Mannerisms  Concentration:  Concentration: Fair and Attention Span: Fair  Recall:  [employer] of Knowledge:  Fair  Language:  Fair  Akathisia:  No  Handed:  Right  AIMS (if indicated):     Assets:  Desire for Improvement  ADL's:  Intact  Cognition:  WNL  Sleep:  Number of Hours: 6     Treatment Plan Summary: Plan To increase Seroquel to Seroquel XL 100 mg 1 in the morning, 1 at 4 PM and to continue 400 mg at bedtime  To restart trazodone 100 mg at bedtime as needed to help with sleep Continue every 15 minute checks. ContinueTegretol 200 mg 3 times a day for mood stabilization. Continue Neurontin 600 mg 3 times a day for mood stabilization Continue nicotine patch Continue Synthroid 50 g for hypothyroidism 1 every morning Continue Remeron 30 mg 1 at bedtime for depression Continue Zoloft 25 mg daily for depression. Continue clonidine/cows opiate detox plan  Continue to participate in therapeutic milieu  Nelly Rout, MD 10/13/2016, 2:33 PM

## 2016-10-13 NOTE — Progress Notes (Signed)
Pt did not attend wrap up karaoke group. Pt stayed in her room in bed [name], Elige Shouse C, [place] 10/13/16  9:57 PM

## 2016-10-13 NOTE — Progress Notes (Signed)
Pt has been observed out in the milieu and in the dayroom with minimal interaction with peers.  She continues to focus on medications and requests prns frequently.  Her speech is not as slurred as a couple of days ago when writer was assigned to her, but she is still somewhat lethargic.  Her COWs is decreasing.  She has been ordered Keflex for a UTI after a culture was done, but has not c/o any symptoms.  Pt was upset this evening that Trazodone was not scheduled for her.  Pt denies SI/HI/AVH.  She has been cooperative with staff and has not been irritable this evening.  Support and encouragement offered.  Discharge plans are in process.  Safety maintained with q15 minute checks.

## 2016-10-13 NOTE — Progress Notes (Signed)
Adult Psychoeducational Group Note  Date:  10/13/2016 Time:  10:00AM Group Topic/Focus:  Making Healthy Choices:   The focus of this group is to help patients identify negative/unhealthy choices they were using prior to admission and identify positive/healthier coping strategies to replace them upon discharge.  Participation Level:  Did Not Attend    Becky [name] 10/13/2016, 3:30 PM

## 2016-10-14 DIAGNOSIS — F1590 Other stimulant use, unspecified, uncomplicated: Secondary | ICD-10-CM

## 2016-10-14 MED ORDER — SERTRALINE HCL 50 MG PO TABS
50.0000 mg | ORAL_TABLET | Freq: Every day | ORAL | Status: DC
Start: 2016-10-15 — End: 2016-10-18
  Administered 2016-10-15 – 2016-10-18 (×4): 50 mg via ORAL
  Filled 2016-10-14 (×4): qty 1
  Filled 2016-10-14: qty 7
  Filled 2016-10-14: qty 1

## 2016-10-14 MED ORDER — PRAZOSIN HCL 2 MG PO CAPS
2.0000 mg | ORAL_CAPSULE | Freq: Every day | ORAL | Status: DC
  Administered 2016-10-14 – 2016-10-17 (×4): 2 mg via ORAL
  Filled 2016-10-14 (×3): qty 1
  Filled 2016-10-14: qty 7
  Filled 2016-10-14 (×2): qty 1

## 2016-10-14 MED ORDER — CLONIDINE HCL 0.1 MG PO TABS
0.0500 mg | ORAL_TABLET | Freq: Three times a day (TID) | ORAL | Status: DC | PRN
  Administered 2016-10-14 – 2016-10-15 (×3): 0.05 mg via ORAL
  Filled 2016-10-14 (×2): qty 1

## 2016-10-14 MED ORDER — QUETIAPINE FUMARATE 200 MG PO TABS: 200.0000 mg | ORAL_TABLET | Freq: Two times a day (BID) | ORAL | Status: DC

## 2016-10-14 MED ORDER — QUETIAPINE FUMARATE 200 MG PO TABS
200.0000 mg | ORAL_TABLET | Freq: Two times a day (BID) | ORAL | Status: DC
  Administered 2016-10-14 – 2016-10-17 (×8): 200 mg via ORAL
  Filled 2016-10-14 (×12): qty 1

## 2016-10-14 MED ORDER — GABAPENTIN 300 MG PO CAPS
900.0000 mg | ORAL_CAPSULE | Freq: Three times a day (TID) | ORAL | Status: DC
  Administered 2016-10-14 – 2016-10-17 (×10): 900 mg via ORAL
  Filled 2016-10-14 (×17): qty 3

## 2016-10-14 MED ORDER — MIRTAZAPINE 15 MG PO TABS
45.0000 mg | ORAL_TABLET | Freq: Every day | ORAL | Status: DC
  Administered 2016-10-14 – 2016-10-17 (×4): 45 mg via ORAL
  Filled 2016-10-14: qty 1
  Filled 2016-10-14: qty 3
  Filled 2016-10-14 (×4): qty 1
  Filled 2016-10-14: qty 21

## 2016-10-14 NOTE — Plan of Care (Signed)
Problem: Medication: Goal: Compliance with prescribed medication regimen will improve Outcome: Progressing Pt taking medications

## 2016-10-14 NOTE — Progress Notes (Signed)
Recreation Therapy Notes  Date:  10/14/16 Time: 0930 Location: [address] Dayroom  Group Topic: Stress Management  Goal Area(s) Addresses:  Patient will verbalize importance of using healthy stress management.  Patient will identify positive emotions associated with healthy stress management.   Behavioral Response: Engaged  Intervention: Stress Management  Activity :  Guided Imagery.  LRT introduced the stress management technique of guided imagery.  LRT read a script to allow patients to engage in the activity.  Patients were to follow along with as LRT read the script to participate in the activity.  Education:  Stress Management, Discharge Planning.   Education Outcome: Acknowledges edcuation/In group clarification offered/Needs additional education  Clinical Observations/Feedback: Pt attended group.   Doratha Mcswain, LRT/CTRS         Marvie Calender A 10/14/2016 11:23 AM 

## 2016-10-14 NOTE — Progress Notes (Signed)
Patient ID: Becky PancoastDanielle [name], female   DOB: 12/20/1990, 26 y.o.   MRN: [id number]  Pt currently presents with a flat affect and labile behavior. Pt frequently asks for medications. Pt states "I cannot live withy my mom when I leave here, she takes more medications than I do." Pt preoccupied with drug use, ruminates about heroin use. Pt reports poor sleep with current medication regimen. Pt states "I was asleep all day today, I think I am going through withdrawal now." Reports withdrawal symptoms including abdominal cramping, diarrhea, night sweats and general body aching.  Pt provided with medications per providers orders. Pt's labs and vitals were monitored throughout the night. Pt given a 1:1 about emotional and mental status. Pt supported and encouraged to express concerns and questions. Pt educated on medications and assertiveness techniques.   Pt's safety ensured with 15 minute and environmental checks. Pt currently denies SI/HI and A/V hallucinations. Pt verbally agrees to seek staff if SI/HI or A/VH occurs and to consult with staff before acting on any harmful thoughts. Pt focuses on blaming others, specifically staff tonight. Encouraged to speak openly with staff about concerns. Will continue POC.

## 2016-10-14 NOTE — Progress Notes (Signed)
[facility] MD Progress Note  10/14/2016 10:44 AM Arnett Galindez  MRN:  [id number] Subjective:  [name] reports that she feels the XR seroquel doesn't kick in fast enough.  She used to be on 300 mg BID, and 400 mg qhs. We agreed to 200 mg BID and continue 400 mg qhs.  She reports trazodone has been poor for sleep, continues nightmares. Will restart prazosin 2 mg qhs which has been beneficial in the past.  Continues with withdrawal nausea, GI upset, anxiety.  She is eager to learn about naltrexone and vivtrol and prefers to consider this over anything that has any opiate activity.   Principal Problem: Bipolar 1 disorder, mixed, severe (HCC) Diagnosis:   Patient Active Problem List   Diagnosis Date Noted  . Bipolar 1 disorder, mixed, severe (HCC) [F31.63] 10/09/2016  . Opioid use disorder, moderate, dependence (HCC) [F11.20] 06/06/2016  . Sedative, hypnotic or anxiolytic use disorder, severe, dependence (HCC) [F13.20] 06/06/2016  . Cocaine use disorder, severe, dependence (HCC) [F14.20] 06/06/2016  . Cannabis use disorder, severe, dependence (HCC) [F12.20] 06/06/2016  . MDD (major depressive disorder), recurrent severe, without psychosis (HCC) [F33.2] 06/03/2016  . Bipolar I disorder, most recent episode depressed (HCC) [F31.30] 07/04/2015  . Overdose [T50.901A] 07/02/2015  . Lactic acidosis [E87.2] 07/02/2015  . Acute respiratory failure (HCC) [J96.00] 07/02/2015  . Sepsis (HCC) [A41.9] 07/02/2015  . AKI (acute kidney injury) (HCC) [N17.9]   . Altered mental status [R41.82]   . Pyrexia [R50.9]   . Abdominal pain, epigastric [R10.13] 06/17/2015  . Hepatitis C antibody test positive [R76.8] 06/17/2015  . Peptic ulcer disease [K27.9] 06/17/2015  . Epigastric pain [R10.13]   . Transaminitis [R74.0] 06/16/2015  . History of substance abuse [Z87.898] 06/16/2015  . Bipolar affective disorder, current episode manic with psychotic symptoms (HCC) [F31.2] 03/03/2014   Total Time spent with  patient: 30 minutes  Past Psychiatric History: see H&P , unchanged  Past Medical History:  Past Medical History:  Diagnosis Date  . Bipolar 1 disorder (HCC)   . Bronchitis   . GAD (generalized anxiety disorder)   . Hepatitis C antibody test positive 06/17/2015  . History of substance abuse 06/16/2015  . OCD (obsessive compulsive disorder)   . Peptic ulcer   . Polysubstance abuse   . PTSD (post-traumatic stress disorder)     Past Surgical History:  Procedure Laterality Date  . OTHER SURGICAL HISTORY     scar tissue removed from right ovary   . OTHER SURGICAL HISTORY     fallopian tube repair  . WISDOM TOOTH EXTRACTION     Family History:  Family History  Problem Relation Age of Onset  . Drug abuse Brother   . Suicidality Brother   . Cancer Other   . Depression Mother   . Drug abuse Mother   . Alcoholism Father   . Bipolar disorder Cousin    Family Psychiatric  History: see H&P, unchanged Social History:  History  Alcohol Use  . Yes    Comment: occ     History  Drug Use  . Types: Marijuana, Methamphetamines, MDMA (Ecstacy), Oxycodone, Benzodiazepines, Cocaine    Social History   Social History  . Marital status: Legally Separated    Spouse name: N/A  . Number of children: N/A  . Years of education: N/A   Social History Main Topics  . Smoking status: Current Every Day Smoker    Packs/day: 1.00    Years: 10.00    Types: Cigarettes  . Smokeless tobacco:  Never Used  . Alcohol use Yes     Comment: occ  . Drug use: Yes    Types: Marijuana, Methamphetamines, MDMA (Ecstacy), Oxycodone, Benzodiazepines, Cocaine  . Sexual activity: Yes    Birth control/ protection: None   Other Topics Concern  . None   Social History Narrative  . None   Additional Social History:                         Sleep: Poor  Appetite:  Fair  Current Medications: Current Facility-Administered Medications  Medication Dose Route Frequency Provider Last Rate Last Dose   . acetaminophen (TYLENOL) tablet 650 mg  650 mg Oral Q6H PRN Laveda AbbeParks, Laurie Britton, NP   650 mg at 10/12/16 0421  . alum & mag hydroxide-simeth (MAALOX/MYLANTA) 200-200-20 MG/5ML suspension 30 mL  30 mL Oral Q4H PRN Laveda AbbeParks, Laurie Britton, NP   30 mL at 10/13/16 2142  . carbamazepine (TEGRETOL) tablet 200 mg  200 mg Oral TID Laveda AbbeParks, Laurie Britton, NP   200 mg at 10/14/16 0800  . cephALEXin (KEFLEX) capsule 500 mg  500 mg Oral Q12H Withrow, John C, FNP   500 mg at 10/14/16 0800  . [START ON 10/15/2016] cloNIDine (CATAPRES) tablet 0.1 mg  0.1 mg Oral QAC breakfast Laveda AbbeParks, Laurie Britton, NP      . dicyclomine (BENTYL) tablet 20 mg  20 mg Oral Q6H PRN Laveda AbbeParks, Laurie Britton, NP   20 mg at 10/14/16 0518  . gabapentin (NEURONTIN) capsule 900 mg  900 mg Oral TID Burnard LeighEksir, Alexander Arya, MD      . hydrOXYzine (ATARAX/VISTARIL) tablet 50 mg  50 mg Oral Q6H PRN Laveda AbbeParks, Laurie Britton, NP   50 mg at 10/14/16 0802  . levothyroxine (SYNTHROID, LEVOTHROID) tablet 50 mcg  50 mcg Oral QAC breakfast Laveda AbbeParks, Laurie Britton, NP   50 mcg at 10/14/16 0554  . loperamide (IMODIUM) capsule 2-4 mg  2-4 mg Oral PRN Laveda AbbeParks, Laurie Britton, NP   2 mg at 10/14/16 0850  . magnesium hydroxide (MILK OF MAGNESIA) suspension 30 mL  30 mL Oral Daily PRN Laveda AbbeParks, Laurie Britton, NP      . methocarbamol (ROBAXIN) tablet 500 mg  500 mg Oral Q8H PRN Laveda AbbeParks, Laurie Britton, NP   500 mg at 10/14/16 0228  . mirtazapine (REMERON) tablet 45 mg  45 mg Oral QHS Burnard LeighEksir, Alexander Arya, MD      . naproxen (NAPROSYN) tablet 500 mg  500 mg Oral BID PRN Laveda AbbeParks, Laurie Britton, NP   500 mg at 10/12/16 2130  . nicotine polacrilex (NICORETTE) gum 2 mg  2 mg Oral PRN Cobos, Rockey SituFernando A, MD   2 mg at 10/14/16 0803  . ondansetron (ZOFRAN-ODT) disintegrating tablet 4 mg  4 mg Oral Q6H PRN Laveda AbbeParks, Laurie Britton, NP   4 mg at 10/14/16 0850  . prazosin (MINIPRESS) capsule 2 mg  2 mg Oral QHS Eksir, Bo McclintockAlexander Arya, MD      . QUEtiapine (SEROQUEL) tablet 200 mg  200 mg Oral  BID Burnard LeighEksir, Alexander Arya, MD      . QUEtiapine (SEROQUEL) tablet 400 mg  400 mg Oral QHS Laveda AbbeParks, Laurie Britton, NP   400 mg at 10/13/16 2104  . [START ON 10/15/2016] sertraline (ZOLOFT) tablet 50 mg  50 mg Oral Daily Eksir, Bo McclintockAlexander Arya, MD        Lab Results:  No results found for this or any previous visit (from the past 48 hour(s)).  Blood Alcohol level:  Lab Results  Component Value Date   ETH <5 10/07/2016   ETH <5 05/31/2016    Metabolic Disorder Labs: Lab Results  Component Value Date   HGBA1C 4.9 06/07/2016   MPG 94 06/07/2016   MPG 117 (H) 03/05/2014   Lab Results  Component Value Date   PROLACTIN 49.9 (H) 06/07/2016   Lab Results  Component Value Date   CHOL 173 06/07/2016   TRIG 152 (H) 06/07/2016   HDL 67 06/07/2016   CHOLHDL 2.6 06/07/2016   VLDL 30 06/07/2016   LDLCALC 76 06/07/2016   LDLCALC 130 (H) 03/05/2014    Physical Findings: AIMS: Facial and Oral Movements Muscles of Facial Expression: None, normal Lips and Perioral Area: None, normal Jaw: None, normal Tongue: None, normal,Extremity Movements Upper (arms, wrists, hands, fingers): None, normal Lower (legs, knees, ankles, toes): None, normal, Trunk Movements Neck, shoulders, hips: None, normal, Overall Severity Severity of abnormal movements (highest score from questions above): None, normal Incapacitation due to abnormal movements: None, normal Patient's awareness of abnormal movements (rate only patient's report): No Awareness, Dental Status Current problems with teeth and/or dentures?: No Does patient usually wear dentures?: No  CIWA:    COWS:  COWS Total Score: 4  Musculoskeletal: Strength & Muscle Tone: within normal limits Gait & Station: normal Patient leans: N/A  Psychiatric Specialty Exam: Physical Exam  Review of Systems  Constitutional: Negative.  Negative for fever, malaise/fatigue and weight loss.  HENT: Negative.  Negative for congestion, hearing loss and sore throat.    Eyes: Negative.  Negative for blurred vision and discharge.  Respiratory: Negative.  Negative for cough, shortness of breath and wheezing.   Cardiovascular: Negative.  Negative for chest pain and palpitations.  Gastrointestinal: Negative.  Negative for abdominal pain, heartburn, nausea and vomiting.  Genitourinary: Negative.  Negative for dysuria.  Musculoskeletal: Negative.  Negative for falls and myalgias.  Skin: Negative.  Negative for rash.  Neurological: Negative.  Negative for dizziness, seizures and loss of consciousness.  Endo/Heme/Allergies: Negative.  Negative for environmental allergies.  Psychiatric/Behavioral: Positive for depression, substance abuse and suicidal ideas. Negative for hallucinations and memory loss. The patient is nervous/anxious. The patient does not have insomnia.     Blood pressure 117/70, pulse (!) 108, temperature 98 F (36.7 C), temperature source Oral, resp. rate 18, height 5\' 3"  (1.6 m), weight 65.3 kg (144 lb), SpO2 100 %.Body mass index is 25.51 kg/m.  General Appearance: Casual  Eye Contact:  Fair  Speech:  Clear and Coherent and Normal Rate  Volume:  Normal  Mood:  Anxious, Depressed and Dysphoric  Affect:  Congruent and Labile  Thought Process:  Coherent, Goal Directed and Descriptions of Associations: Intact  Orientation:  Full (Time, Place, and Person)  Thought Content:  Logical  Suicidal Thoughts:  Yes.  without intent/plan, if discharged as she cannot keep herself safe outside the hospital  Homicidal Thoughts:  No  Memory:  Immediate;   Fair Recent;   Fair Remote;   Fair  Judgement:  Impaired  Insight:  Present  Psychomotor Activity:  Mannerisms  Concentration:  Concentration: Fair and Attention Span: Fair  Recall:  [employer] of Knowledge:  Fair  Language:  Fair  Akathisia:  No  Handed:  Right  AIMS (if indicated):     Assets:  Desire for Improvement  ADL's:  Intact  Cognition:  WNL  Sleep:  Number of Hours: 5.75    Treatment  Plan Summary: Seroquel 200 mg qam, 200 mg qlunch, and  400 mg qhs Trazodone dc in favor of prazosin, trazodone caused nightmares Prazosin 2 mg qhs Continue every 15 minute checks. ContinueTegretol 200 mg 3 times a day for mood stabilization. Continue Neurontin 900 mg 3 times a day for mood stabilization Continue nicotine patch Continue Synthroid 50 g for hypothyroidism 1 every morning Increase Remeron 45 mg 1 at bedtime for depression Increase Zoloft 50 mg daily for depression Continue clonidine/cows opiate detox plan Continue to participate in therapeutic milieu  Burnard Leigh, MD 10/14/2016, 10:44 AM

## 2016-10-14 NOTE — Tx Team (Signed)
Interdisciplinary Treatment and Diagnostic Plan Update 10/14/2016 Time of Session: 9:30am  Becky [name]  MRN: [id number]  Principal Diagnosis: Bipolar 1 disorder, mixed, severe (HCC)  Secondary Diagnoses: Principal Problem:   Bipolar 1 disorder, mixed, severe (HCC)   Current Medications:  Current Facility-Administered Medications  Medication Dose Route Frequency Provider Last Rate Last Dose  . acetaminophen (TYLENOL) tablet 650 mg  650 mg Oral Q6H PRN Laveda Abbe, NP   650 mg at 10/14/16 1512  . alum & mag hydroxide-simeth (MAALOX/MYLANTA) 200-200-20 MG/5ML suspension 30 mL  30 mL Oral Q4H PRN Laveda Abbe, NP   30 mL at 10/13/16 2142  . carbamazepine (TEGRETOL) tablet 200 mg  200 mg Oral TID Laveda Abbe, NP   200 mg at 10/14/16 1157  . cephALEXin (KEFLEX) capsule 500 mg  500 mg Oral Q12H Withrow, John C, FNP   500 mg at 10/14/16 0800  . cloNIDine (CATAPRES) tablet 0.05 mg  0.05 mg Oral TID PRN Burnard Leigh, MD   0.05 mg at 10/14/16 1515  . dicyclomine (BENTYL) tablet 20 mg  20 mg Oral Q6H PRN Laveda Abbe, NP   20 mg at 10/14/16 1204  . gabapentin (NEURONTIN) capsule 900 mg  900 mg Oral TID Burnard Leigh, MD   900 mg at 10/14/16 1157  . hydrOXYzine (ATARAX/VISTARIL) tablet 50 mg  50 mg Oral Q6H PRN Laveda Abbe, NP   50 mg at 10/14/16 0802  . levothyroxine (SYNTHROID, LEVOTHROID) tablet 50 mcg  50 mcg Oral QAC breakfast Laveda Abbe, NP   50 mcg at 10/14/16 0554  . loperamide (IMODIUM) capsule 2-4 mg  2-4 mg Oral PRN Laveda Abbe, NP   2 mg at 10/14/16 0850  . magnesium hydroxide (MILK OF MAGNESIA) suspension 30 mL  30 mL Oral Daily PRN Laveda Abbe, NP      . methocarbamol (ROBAXIN) tablet 500 mg  500 mg Oral Q8H PRN Laveda Abbe, NP   500 mg at 10/14/16 1204  . mirtazapine (REMERON) tablet 45 mg  45 mg Oral QHS Burnard Leigh, MD      . naproxen (NAPROSYN) tablet 500 mg  500  mg Oral BID PRN Laveda Abbe, NP   500 mg at 10/12/16 2130  . nicotine polacrilex (NICORETTE) gum 2 mg  2 mg Oral PRN Cobos, Rockey Situ, MD   2 mg at 10/14/16 1511  . ondansetron (ZOFRAN-ODT) disintegrating tablet 4 mg  4 mg Oral Q6H PRN Laveda Abbe, NP   4 mg at 10/14/16 0850  . prazosin (MINIPRESS) capsule 2 mg  2 mg Oral QHS Burnard Leigh, MD      . QUEtiapine (SEROQUEL) tablet 200 mg  200 mg Oral BID Burnard Leigh, MD   200 mg at 10/14/16 1200  . QUEtiapine (SEROQUEL) tablet 400 mg  400 mg Oral QHS Laveda Abbe, NP   400 mg at 10/13/16 2104  . [START ON 10/15/2016] sertraline (ZOLOFT) tablet 50 mg  50 mg Oral Daily Eksir, Bo Mcclintock, MD        PTA Medications: Prescriptions Prior to Admission  Medication Sig Dispense Refill Last Dose  . benztropine (COGENTIN) 1 MG tablet Take 1 tablet (1 mg total) by mouth at bedtime. (Patient not taking: Reported on 10/07/2016) 30 tablet 0 Not Taking at Unknown time  . carbamazepine (TEGRETOL) 200 MG tablet Take 200 mg by mouth 3 (three) times daily.   Not Taking at Unknown time  .  citalopram (CELEXA) 20 MG tablet Take 20 mg by mouth daily.  1 Not Taking at Unknown time  . doxepin (SINEQUAN) 25 MG capsule Take 1 capsule (25 mg total) by mouth at bedtime. (Patient not taking: Reported on 10/07/2016) 30 capsule 0 Not Taking at Unknown time  . gabapentin (NEURONTIN) 300 MG capsule Take 2 capsules (600 mg total) by mouth 3 (three) times daily. (Patient not taking: Reported on 10/07/2016) 180 capsule 0 Not Taking at Unknown time  . hydrOXYzine (ATARAX/VISTARIL) 50 MG tablet Take 1 tablet (50 mg total) by mouth every 6 (six) hours as needed for anxiety (CIWA LESS THAN 10). (Patient not taking: Reported on 10/07/2016) 30 tablet 0 Not Taking at Unknown time  . levothyroxine (SYNTHROID, LEVOTHROID) 50 MCG tablet Take 1 tablet (50 mcg total) by mouth every morning. (Patient not taking: Reported on 10/07/2016) 30 tablet 0 Not  Taking at Unknown time  . mirtazapine (REMERON) 30 MG tablet Take 1 tablet (30 mg total) by mouth at bedtime. (Patient not taking: Reported on 10/07/2016) 30 tablet 0 Not Taking at Unknown time  . nicotine polacrilex (NICORETTE) 2 MG gum Take 1 each (2 mg total) by mouth as needed for smoking cessation. (Patient not taking: Reported on 10/07/2016) 100 tablet 0 Not Taking at Unknown time  . pantoprazole (PROTONIX) 20 MG tablet Take 1 tablet (20 mg total) by mouth daily. (Patient not taking: Reported on 10/07/2016) 30 tablet 0 Not Taking at Unknown time  . QUEtiapine (SEROQUEL) 300 MG tablet Take 1 tablet (300 mg total) by mouth 2 (two) times daily at 7 am and 12 noon. (Patient not taking: Reported on 10/07/2016) 30 tablet 0 Not Taking at Unknown time  . QUEtiapine (SEROQUEL) 400 MG tablet Take 1 tablet (400 mg total) by mouth at bedtime. (Patient not taking: Reported on 10/07/2016) 30 tablet 0 Not Taking at Unknown time    Treatment Modalities: Medication Management, Group therapy, Case management,  1 to 1 session with clinician, Psychoeducation, Recreational therapy.  Patient Stressors: Marital or family conflict Substance abuse Patient Strengths: Ability for insight Average or above average intelligence Capable of independent living General fund of knowledge Motivation for treatment/growth  Physician Treatment Plan for Primary Diagnosis: Bipolar 1 disorder, mixed, severe (HCC) Long Term Goal(s): Improvement in symptoms so as ready for discharge Short Term Goals: Ability to identify changes in lifestyle to reduce recurrence of condition will improve Ability to verbalize feelings will improve Ability to identify and develop effective coping behaviors will improve Compliance with prescribed medications will improve Ability to identify triggers associated with substance abuse/mental health issues will improve Ability to verbalize feelings will improve Ability to identify and develop effective  coping behaviors will improve Ability to maintain clinical measurements within normal limits will improve  Medication Management: Evaluate patient's response, side effects, and tolerance of medication regimen.  Therapeutic Interventions: 1 to 1 sessions, Unit Group sessions and Medication administration.  Evaluation of Outcomes: Progressing  Physician Treatment Plan for Secondary Diagnosis: Principal Problem:   Bipolar 1 disorder, mixed, severe (HCC)  Long Term Goal(s): Improvement in symptoms so as ready for discharge  Short Term Goals: Ability to identify changes in lifestyle to reduce recurrence of condition will improve Ability to verbalize feelings will improve Ability to identify and develop effective coping behaviors will improve Compliance with prescribed medications will improve Ability to identify triggers associated with substance abuse/mental health issues will improve Ability to verbalize feelings will improve Ability to identify and develop effective coping behaviors will  improve Ability to maintain clinical measurements within normal limits will improve  Medication Management: Evaluate patient's response, side effects, and tolerance of medication regimen.  Therapeutic Interventions: 1 to 1 sessions, Unit Group sessions and Medication administration.  Evaluation of Outcomes: Progressing  RN Treatment Plan for Primary Diagnosis: Bipolar 1 disorder, mixed, severe (HCC) Long Term Goal(s): Knowledge of disease and therapeutic regimen to maintain health will improve  Short Term Goals: Compliance with prescribed medications will improve  Medication Management: RN will administer medications as ordered by provider, will assess and evaluate patient's response and provide education to patient for prescribed medication. RN will report any adverse and/or side effects to prescribing provider.  Therapeutic Interventions: 1 on 1 counseling sessions, Psychoeducation, Medication  administration, Evaluate responses to treatment, Monitor vital signs and CBGs as ordered, Perform/monitor CIWA, COWS, AIMS and Fall Risk screenings as ordered, Perform wound care treatments as ordered.  Evaluation of Outcomes: Progressing  LCSW Treatment Plan for Primary Diagnosis: Bipolar 1 disorder, mixed, severe (HCC) Long Term Goal(s): Safe transition to appropriate next level of care at discharge, Engage patient in therapeutic group addressing interpersonal concerns. Short Term Goals: Engage patient in aftercare planning with referrals and resources, Facilitate patient progression through stages of change regarding substance use diagnoses and concerns, Identify triggers associated with mental health/substance abuse issues and Increase skills for wellness and recovery  Therapeutic Interventions: Assess for all discharge needs, 1 to 1 time with Social worker, Explore available resources and support systems, Assess for adequacy in community support network, Educate family and significant other(s) on suicide prevention, Complete Psychosocial Assessment, Interpersonal group therapy.  Evaluation of Outcomes: Progressing  Progress in Treatment: Attending groups: Intermittently Participating in groups: Yes  Taking medication as prescribed: Yes, MD continues to assess for medication changes as needed Toleration medication: Yes, no side effects reported at this time Family/Significant other contact made: No, CSW attempting to make contact with aunt or mother Patient understands diagnosis: Continuing to assess Discussing patient identified problems/goals with staff: Yes Medical problems stabilized or resolved: Yes Denies suicidal/homicidal ideation: Yes Issues/concerns per patient self-inventory: None Other: N/A  New problem(s) identified: None identified at this time.   New Short Term/Long Term Goal(s): None identified at this time.   Discharge Plan or Barriers: Pt will return home and  follow-up outpatient with [facility].  Reason for Continuation of Hospitalization:   Depression Medication stabilization Withdrawal symptoms  Estimated Length of Stay: 2-3 days  Attendees: Patient: 10/14/2016 4:16 PM  Physician: Dr. Rene KocherEksir, RN 10/14/2016 4:16 PM  Nursing: Boyd KerbsPenny, RN; Pen ArgylPatty, RN 10/14/2016 4:16 PM  RN Care Manager:  10/14/2016 4:16 PM  Social Worker: Donnelly StagerLynn Bryant, LCSWA 10/14/2016 4:16 PM  Recreational Therapist:  10/14/2016 4:16 PM  Other: Armandina StammerAgnes Nwoko, NP; Hillery Jacksanika Lewis, NP 10/14/2016 4:16 PM  Other:  10/14/2016 4:16 PM  Other: 10/14/2016 4:16 PM   Scribe for Treatment Team: Vernie ShanksLauren Inetta Dicke, LCSW Clinical Social Work [phone]

## 2016-10-14 NOTE — Progress Notes (Signed)
D: Pt denies SI/HI/AVH. Pt is very needy, pt presents with med seeking behaviors. Pt continues to complain that she is having withdrawal Sx. Pt stated she was tired of being on drugs, but her conversation is not consistent with someone wanting to stop using. Pt continues to have excuses for why she can't stop.   A: Pt was offered support and encouragement. Pt was given scheduled medications. Pt was encourage to attend groups. Q 15 minute checks were done for safety.   R:Pt attends groups and interacts well with peers and staff. Pt is taking medication. Pt has no complaints.Pt receptive to treatment and safety maintained on unit.

## 2016-10-14 NOTE — [facility] Group Notes (Signed)
[facility] LCSW Group Therapy 10/14/2016 1:15pm  Type of Therapy: Group Therapy- Feelings Around Relapse and Recovery  Participation Level: Pt invited. Did not attend.  Jonathon JordanLynn B Dilara Navarrete, MSW, Theresia MajorsLCSWA [phone] 10/14/2016 4:05 PM

## 2016-10-14 NOTE — Progress Notes (Signed)
Data. Patient denies SI/HI/AVH. Patient has made multiple medication requests, often coming to the nurse, "To see if I have anything I can have now." Patient continues to report withdrawal symptoms, "I think I took so much, that it is just now getting out of my system." Patient C/O nausea, abdominal cramping. Diarrhea, generalized pain, anxiety, headache and Restlessness, at different times through out the day.  Affect is flat when talking to nurse, but on observation when she is not interacting with staff, patients affect is bright and she has been observed laughing with peers at times. On her self assessment she reports 7/210 for depression, 3/10 for hopelessness and 8/10 for anxiety. Action. Emotional support and encouragement offered. Education provided on medication, indications and side effect. Q 15 minute checks done for safety. Response. Safety on the unit maintained through 15 minute checks.  Medications taken as prescribed. Attended groups. Remained calm and appropriate through out shift.

## 2016-10-15 MED ORDER — CLONIDINE HCL 0.1 MG PO TABS
0.1000 mg | ORAL_TABLET | Freq: Three times a day (TID) | ORAL | Status: DC | PRN
  Administered 2016-10-15 – 2016-10-18 (×9): 0.1 mg via ORAL
  Filled 2016-10-15 (×9): qty 1

## 2016-10-15 MED ORDER — METHOCARBAMOL 500 MG PO TABS
500.0000 mg | ORAL_TABLET | Freq: Three times a day (TID) | ORAL | Status: DC | PRN
  Administered 2016-10-15 – 2016-10-18 (×8): 500 mg via ORAL
  Filled 2016-10-15 (×8): qty 1

## 2016-10-15 MED ORDER — DICYCLOMINE HCL 20 MG PO TABS
20.0000 mg | ORAL_TABLET | Freq: Four times a day (QID) | ORAL | Status: DC | PRN
  Administered 2016-10-15 – 2016-10-18 (×9): 20 mg via ORAL
  Filled 2016-10-15 (×8): qty 1

## 2016-10-15 MED ORDER — ONDANSETRON 4 MG PO TBDP: 4.0000 mg | ORAL_TABLET | Freq: Four times a day (QID) | ORAL | Status: DC | PRN

## 2016-10-15 MED ORDER — METHOCARBAMOL 500 MG PO TABS
ORAL_TABLET | ORAL | Status: AC
  Filled 2016-10-15: qty 1

## 2016-10-15 MED ORDER — LOPERAMIDE HCL 2 MG PO CAPS
ORAL_CAPSULE | ORAL | Status: AC
  Administered 2016-10-15: 2 mg
  Filled 2016-10-15: qty 1

## 2016-10-15 MED ORDER — DICYCLOMINE HCL 20 MG PO TABS
ORAL_TABLET | ORAL | Status: AC
  Administered 2016-10-15: 20 mg via ORAL
  Filled 2016-10-15: qty 1

## 2016-10-15 MED ORDER — LOPERAMIDE HCL 2 MG PO CAPS
2.0000 mg | ORAL_CAPSULE | ORAL | Status: DC | PRN
  Administered 2016-10-16 (×2): 2 mg via ORAL
  Administered 2016-10-17: 4 mg via ORAL
  Administered 2016-10-17: 2 mg via ORAL
  Filled 2016-10-15: qty 2
  Filled 2016-10-15 (×3): qty 1

## 2016-10-15 MED ORDER — NAPROXEN 500 MG PO TABS: 500.0000 mg | ORAL_TABLET | Freq: Two times a day (BID) | ORAL | Status: DC | PRN

## 2016-10-15 NOTE — Progress Notes (Signed)
Patient attended group and said that her day was a 7. She was excited because she was able to speak with her physician and he adjustments  her medications.

## 2016-10-15 NOTE — Progress Notes (Addendum)
Patient attended group and said that her day was a 7.   She coping skills for today were watching TV and socializing.

## 2016-10-15 NOTE — Progress Notes (Signed)
Patient ID: Becky [name], female   DOB: 05/09/1991, 26 y.o.   MRN: [id number]  Pt currently presents with a depressed affect and less anxious behavior. Pt attends groups and interacts positively with peers.  Pt states "I am working on coping skills." Pt reports good sleep with current medication regimen.   Pt provided with medications per providers orders. Pt's labs and vitals were monitored throughout the night. Pt given a 1:1 about emotional and mental status. Pt supported and encouraged to express concerns and questions. Pt educated on medications.  Pt's safety ensured with 15 minute and environmental checks. Pt currently denies SI/HI and A/V hallucinations. Pt verbally agrees to seek staff if SI/HI or A/VH occurs and to consult with staff before acting on any harmful thoughts. Will continue POC.

## 2016-10-15 NOTE — [facility] Group Notes (Signed)
Adult Therapy Group Note (Clinical Social Work)  Date:  10/15/2016  Time:  10:00-11:00AM  Group Topic/Focus:  HEALTHY COPING SKILLS  Today's group focused on identifying healthy coping skills already in use by each patient, as well as healthy coping skills they would like to learn.  There was much sharing, support, psychoeducation, and encouragement provided.  Participation Level:  Active  Participation Quality:  Attentive and Sharing  Affect:  Blunted  Cognitive:  Appropriate  Insight: Improving  Engagement in Group:  Developing/Improving  Modes of Intervention:  Discussion, Support and Processing  Additional Comments:  The patient shared that healthy coping already used is not really something she can share, but that she has developed a lot of skills, has a lot of "tools in the toolbox" but forgets to use them or does not think of them at the exact moment they are needed.  She knows she needs to stay on her medications and stop self-medicating.  She stated she has had 128 days of sobriety in 12 years.  Carloyn JaegerMareida J Grossman-Orr 10/15/2016, 1:28 PM

## 2016-10-15 NOTE — Progress Notes (Signed)
[facility] MD Progress Note  10/15/2016 1:34 PM Taliana Mersereau  MRN:  [id number] Subjective:  Becky [name] states she had a good day. She is inquiring about her clonidine being cut in half.   Per nursing:  Patient denies SI/HI/AVH. Patient has made multiple medication requests, often coming to the nurse, "To see if I have anything I can have now." Patient continues to report withdrawal symptoms, "I think I took so much, that it is just now getting out of my system." Patient C/O nausea, abdominal cramping. Diarrhea, generalized pain, anxiety, headache and Restlessness, at different times through out the day.  Affect is flat when talking to nurse, but on observation when she is not interacting with staff, patients affect is bright and she has been observed laughing with peers at times. On her self assessment she reports 7/210 for depression, 3/10 for hopelessness and 8/10 for anxiety.  Objective: On evaluation the patient reported: Patient states that she feels better despite yesterday being so anxious. States that she is eating/sleeping without difficulty; tolerating medications without adverse reactions. She is noting that she continues to withdrawal at this time, and is requesting that her detox protocol be restarted. She is very appreciative about the care she is requesting.  Reports that she continues to attend/participate in group which is helping her learn to communicate better.  States her goal for today is to figure out discharge plan, and looking into the future. At this time patient denies suicidal/self harming thoughts an psychosis. She continues to report compliance with medication at this time. Continues with withdrawal nausea, GI upset, anxiety.  She is eager to learn about naltrexone and vivtrol and prefers to consider this over anything that has any opiate activity.   Principal Problem: Bipolar 1 disorder, mixed, severe (HCC) Diagnosis:   Patient Active Problem List   Diagnosis Date  Noted  . Bipolar 1 disorder, mixed, severe (HCC) [F31.63] 10/09/2016  . Opioid use disorder, moderate, dependence (HCC) [F11.20] 06/06/2016  . Sedative, hypnotic or anxiolytic use disorder, severe, dependence (HCC) [F13.20] 06/06/2016  . Cocaine use disorder, severe, dependence (HCC) [F14.20] 06/06/2016  . Cannabis use disorder, severe, dependence (HCC) [F12.20] 06/06/2016  . MDD (major depressive disorder), recurrent severe, without psychosis (HCC) [F33.2] 06/03/2016  . Bipolar I disorder, most recent episode depressed (HCC) [F31.30] 07/04/2015  . Overdose [T50.901A] 07/02/2015  . Lactic acidosis [E87.2] 07/02/2015  . Acute respiratory failure (HCC) [J96.00] 07/02/2015  . Sepsis (HCC) [A41.9] 07/02/2015  . AKI (acute kidney injury) (HCC) [N17.9]   . Altered mental status [R41.82]   . Pyrexia [R50.9]   . Abdominal pain, epigastric [R10.13] 06/17/2015  . Hepatitis C antibody test positive [R76.8] 06/17/2015  . Peptic ulcer disease [K27.9] 06/17/2015  . Epigastric pain [R10.13]   . Transaminitis [R74.0] 06/16/2015  . History of substance abuse [Z87.898] 06/16/2015  . Bipolar affective disorder, current episode manic with psychotic symptoms (HCC) [F31.2] 03/03/2014   Total Time spent with patient: 30 minutes  Past Psychiatric History: see H&P , unchanged  Past Medical History:  Past Medical History:  Diagnosis Date  . Bipolar 1 disorder (HCC)   . Bronchitis   . GAD (generalized anxiety disorder)   . Hepatitis C antibody test positive 06/17/2015  . History of substance abuse 06/16/2015  . OCD (obsessive compulsive disorder)   . Peptic ulcer   . Polysubstance abuse   . PTSD (post-traumatic stress disorder)     Past Surgical History:  Procedure Laterality Date  . OTHER SURGICAL HISTORY  scar tissue removed from right ovary   . OTHER SURGICAL HISTORY     fallopian tube repair  . WISDOM TOOTH EXTRACTION     Family History:  Family History  Problem Relation Age of Onset  .  Drug abuse Brother   . Suicidality Brother   . Cancer Other   . Depression Mother   . Drug abuse Mother   . Alcoholism Father   . Bipolar disorder Cousin    Family Psychiatric  History: see H&P, unchanged Social History:  History  Alcohol Use  . Yes    Comment: occ     History  Drug Use  . Types: Marijuana, Methamphetamines, MDMA (Ecstacy), Oxycodone, Benzodiazepines, Cocaine    Social History   Social History  . Marital status: Legally Separated    Spouse name: N/A  . Number of children: N/A  . Years of education: N/A   Social History Main Topics  . Smoking status: Current Every Day Smoker    Packs/day: 1.00    Years: 10.00    Types: Cigarettes  . Smokeless tobacco: Never Used  . Alcohol use Yes     Comment: occ  . Drug use: Yes    Types: Marijuana, Methamphetamines, MDMA (Ecstacy), Oxycodone, Benzodiazepines, Cocaine  . Sexual activity: Yes    Birth control/ protection: None   Other Topics Concern  . None   Social History Narrative  . None   Additional Social History:                         Sleep: Poor  Appetite:  Fair  Current Medications: Current Facility-Administered Medications  Medication Dose Route Frequency Provider Last Rate Last Dose  . acetaminophen (TYLENOL) tablet 650 mg  650 mg Oral Q6H PRN Laveda Abbe, NP   650 mg at 10/15/16 1203  . alum & mag hydroxide-simeth (MAALOX/MYLANTA) 200-200-20 MG/5ML suspension 30 mL  30 mL Oral Q4H PRN Laveda Abbe, NP   30 mL at 10/15/16 0904  . carbamazepine (TEGRETOL) tablet 200 mg  200 mg Oral TID Laveda Abbe, NP   200 mg at 10/15/16 1202  . cephALEXin (KEFLEX) capsule 500 mg  500 mg Oral Q12H Withrow, John C, FNP   500 mg at 10/15/16 0755  . cloNIDine (CATAPRES) tablet 0.1 mg  0.1 mg Oral TID PRN Truman Hayward, FNP   0.1 mg at 10/15/16 1031  . dicyclomine (BENTYL) tablet 20 mg  20 mg Oral Q6H PRN Truman Hayward, FNP   20 mg at 10/15/16 1031  . gabapentin  (NEURONTIN) capsule 900 mg  900 mg Oral TID Burnard Leigh, MD   900 mg at 10/15/16 1202  . hydrOXYzine (ATARAX/VISTARIL) tablet 50 mg  50 mg Oral Q6H PRN Laveda Abbe, NP   50 mg at 10/15/16 0802  . levothyroxine (SYNTHROID, LEVOTHROID) tablet 50 mcg  50 mcg Oral QAC breakfast Laveda Abbe, NP   50 mcg at 10/15/16 1610  . loperamide (IMODIUM) capsule 2-4 mg  2-4 mg Oral PRN Starkes, Takia S, FNP      . magnesium hydroxide (MILK OF MAGNESIA) suspension 30 mL  30 mL Oral Daily PRN Laveda Abbe, NP      . methocarbamol (ROBAXIN) 500 MG tablet           . methocarbamol (ROBAXIN) tablet 500 mg  500 mg Oral Q8H PRN Truman Hayward, FNP   500 mg at 10/15/16 0956  .  mirtazapine (REMERON) tablet 45 mg  45 mg Oral QHS Burnard Leigh, MD   45 mg at 10/14/16 2129  . nicotine polacrilex (NICORETTE) gum 2 mg  2 mg Oral PRN Cobos, Rockey Situ, MD   2 mg at 10/15/16 1034  . ondansetron (ZOFRAN-ODT) disintegrating tablet 4 mg  4 mg Oral Q6H PRN Darcella Gasman, Takia S, FNP      . prazosin (MINIPRESS) capsule 2 mg  2 mg Oral QHS Burnard Leigh, MD   2 mg at 10/14/16 2129  . QUEtiapine (SEROQUEL) tablet 200 mg  200 mg Oral BID Burnard Leigh, MD   200 mg at 10/15/16 1204  . QUEtiapine (SEROQUEL) tablet 400 mg  400 mg Oral QHS Laveda Abbe, NP   400 mg at 10/14/16 2129  . sertraline (ZOLOFT) tablet 50 mg  50 mg Oral Daily Burnard Leigh, MD   50 mg at 10/15/16 1610    Lab Results:  No results found for this or any previous visit (from the past 48 hour(s)).  Blood Alcohol level:  Lab Results  Component Value Date   ETH <5 10/07/2016   ETH <5 05/31/2016    Metabolic Disorder Labs: Lab Results  Component Value Date   HGBA1C 4.9 06/07/2016   MPG 94 06/07/2016   MPG 117 (H) 03/05/2014   Lab Results  Component Value Date   PROLACTIN 49.9 (H) 06/07/2016   Lab Results  Component Value Date   CHOL 173 06/07/2016   TRIG 152 (H) 06/07/2016    HDL 67 06/07/2016   CHOLHDL 2.6 06/07/2016   VLDL 30 06/07/2016   LDLCALC 76 06/07/2016   LDLCALC 130 (H) 03/05/2014    Physical Findings: AIMS: Facial and Oral Movements Muscles of Facial Expression: None, normal Lips and Perioral Area: None, normal Jaw: None, normal Tongue: None, normal,Extremity Movements Upper (arms, wrists, hands, fingers): None, normal Lower (legs, knees, ankles, toes): None, normal, Trunk Movements Neck, shoulders, hips: None, normal, Overall Severity Severity of abnormal movements (highest score from questions above): None, normal Incapacitation due to abnormal movements: None, normal Patient's awareness of abnormal movements (rate only patient's report): No Awareness, Dental Status Current problems with teeth and/or dentures?: No Does patient usually wear dentures?: No  CIWA:    COWS:  COWS Total Score: 10  Musculoskeletal: Strength & Muscle Tone: within normal limits Gait & Station: normal Patient leans: N/A  Psychiatric Specialty Exam: Physical Exam   Review of Systems  Constitutional: Negative.  Negative for fever, malaise/fatigue and weight loss.  HENT: Negative.  Negative for congestion, hearing loss and sore throat.   Eyes: Negative.  Negative for blurred vision and discharge.  Respiratory: Negative.  Negative for cough, shortness of breath and wheezing.   Cardiovascular: Negative.  Negative for chest pain and palpitations.  Gastrointestinal: Negative.  Negative for abdominal pain, heartburn, nausea and vomiting.  Genitourinary: Negative.  Negative for dysuria.  Musculoskeletal: Negative.  Negative for falls and myalgias.  Skin: Negative.  Negative for rash.  Neurological: Negative.  Negative for dizziness, seizures and loss of consciousness.  Endo/Heme/Allergies: Negative.  Negative for environmental allergies.  Psychiatric/Behavioral: Positive for depression, substance abuse and suicidal ideas. Negative for hallucinations and memory loss.  The patient is nervous/anxious. The patient does not have insomnia.     Blood pressure (!) 112/91, pulse 95, temperature 98 F (36.7 C), temperature source Oral, resp. rate 18, height 5\' 3"  (1.6 m), weight 65.3 kg (144 lb), SpO2 100 %.Body mass index is 25.51 kg/m.  General Appearance: Casual and Guarded  Eye Contact:  Fair  Speech:  Clear and Coherent and Normal Rate  Volume:  Normal  Mood:  Anxious, Depressed and Dysphoric  Affect:  Congruent, Depressed and Labile  Thought Process:  Coherent, Goal Directed, Linear and Descriptions of Associations: Tangential  Orientation:  Full (Time, Place, and Person)  Thought Content:  Logical  Suicidal Thoughts:  No,  Homicidal Thoughts:  No  Memory:  Immediate;   Fair Recent;   Fair Remote;   Fair  Judgement:  Impaired  Insight:  Present  Psychomotor Activity:  Mannerisms  Concentration:  Concentration: Fair and Attention Span: Fair  Recall:  [employer]ervFair  Fund of Knowledge:  Fair  Language:  Fair  Akathisia:  No  Handed:  Right  AIMS (if indicated):     Assets:  Desire for Improvement  ADL's:  Intact  Cognition:  WNL  Sleep:  Number of Hours: 4.25    Treatment Plan Summary: Seroquel 200 mg qam, 200 mg qlunch, and 400 mg qhs Trazodone dc in favor of prazosin, trazodone caused nightmares Prazosin 2 mg qhs Continue every 15 minute checks. ContinueTegretol 200 mg 3 times a day for mood stabilization. Continue Neurontin 900 mg 3 times a day for mood stabilization Continue nicotine patch Continue Synthroid 50 g for hypothyroidism 1 every morning Increase Remeron 45 mg 1 at bedtime for depression Increase Zoloft 50 mg daily for depression Continue clonidine/cows opiate detox plan Continue to participate in therapeutic milieu  Truman Haywardakia S Starkes, FNP 10/15/2016, 1:34 PM

## 2016-10-15 NOTE — [facility] Group Notes (Signed)
        Idnetifying Needs   Date:  10/15/2016  Time:  1300  Type of Therapy:  Nurse Education / identifying Needs : The group focuses on teaching patients how to identify their needs and then hwo to develop skills needed to get them met.   Participation Level:  Active  Participation Quality:  Attentive  Affect:  Depressed  Cognitive:  Lacking  Insight:  Limited  Engagement in Group:  Lacking  Modes of Intervention:  Education  Summary of Progress/Problems:  Lauralyn Primes 10/15/2016, 5:44 PM

## 2016-10-16 DIAGNOSIS — F411 Generalized anxiety disorder: Secondary | ICD-10-CM | POA: Diagnosis present

## 2016-10-16 LAB — COMPREHENSIVE METABOLIC PANEL
ALBUMIN: 4.3 g/dL (ref 3.5–5.0)
ALT: 140 U/L — AB (ref 14–54)
AST: 86 U/L — ABNORMAL HIGH (ref 15–41)
Alkaline Phosphatase: 104 U/L (ref 38–126)
Anion gap: 8 (ref 5–15)
BUN: 11 mg/dL (ref 6–20)
CHLORIDE: 96 mmol/L — AB (ref 101–111)
CO2: 26 mmol/L (ref 22–32)
CREATININE: 0.7 mg/dL (ref 0.44–1.00)
Calcium: 9.4 mg/dL (ref 8.9–10.3)
GFR calc non Af Amer: >60 mL/min (ref >60)
GLUCOSE: 89 mg/dL (ref 65–99)
Potassium: 4.7 mmol/L (ref 3.5–5.1)
SODIUM: 130 mmol/L — AB (ref 135–145)
Total Bilirubin: 0.6 mg/dL (ref 0.3–1.2)
Total Protein: 8 g/dL (ref 6.5–8.1)

## 2016-10-16 LAB — LIPID PANEL
Cholesterol: 207 mg/dL — ABNORMAL HIGH (ref 0–200)
HDL: 104 mg/dL (ref >40)
LDL Cholesterol: 75 mg/dL (ref 0–99)
Total CHOL/HDL Ratio: 2 RATIO
Triglycerides: 140 mg/dL (ref <150)
VLDL: 28 mg/dL (ref 0–40)

## 2016-10-16 LAB — TSH: TSH: 0.711 u[IU]/mL (ref 0.350–4.500)

## 2016-10-16 NOTE — [facility] Group Notes (Signed)
[facility] Group Notes:  (Clinical Social Work)   10/16/2016    1:15-2:15PM  Summary of Progress/Problems:   The main focus of today's process group was to   1)  discuss the importance of adding supports  2)  define health supports versus unhealthy supports  4)  identify the patient's current healthy supports and  4)  plan what healthy supports to add.  An emphasis was placed on using counselor, doctor, therapy groups, 12-step groups, and problem-specific support groups to expand supports.    The patient expressed full comprehension of the concepts presented, and agreed that there is a need to add more supports.   She stated that the only people who can help her are those who have used drugs like she does, because if they do not know what that was like, they cannot relate or understand her or be helpful.  She did not indicate any change in her viewpoint after others shared why they thought that it was important for people to want to understand, rather than to have experienced the same circumstances.  The patient stated her mother is both a healthy and unhealthy support for her because she is encouraging but also an enabler.  She plans to get a sponsor.  She said that she has to have the willpower herself to be sober, and did not respond when the idea of willpower being enough was challenged.  She kept leaning forward and even got out of her seat several times to see the clock, making it very obvious what she was doing.  She was not engaged, indicated she does not feel she needs accountability to anyone but herself, saying that she knows what to do, knows what to say, and only needs to do it.  Others tried to address this with her, but she did not verbally indicate whether she felt she could agree with that or even think about it.  Type of Therapy:  Process Group with Motivational Interviewing  Participation Level:  Minimal  Participation Quality:  Resistant  Affect:  Blunted and Irritable  Cognitive:   Appropriate  Insight:  Poor  Engagement in Therapy:  Poor  Modes of Intervention:   Education, Support and Processing  Ambrose MantleMareida Grossman-Orr, LCSW 10/16/2016    4:38 PM

## 2016-10-16 NOTE — Progress Notes (Signed)
[facility] MD Progress Note  10/16/2016 10:35 AM Becky [name]  MRN:  [id number] Subjective:  Becky [name] is concerned about her peers and states that she called her a bitch. She is requesting that the peer be discharged. She is inquiring about Vivitrol and discussed this with the previous withdrawal. Im still anxious I know Im on a lot of medications. I want to stop chasing the drugs. Once you shoot it up you will never snort it again. Im not saying I want ever smoke weed but I am saying I wont do heroin.   Per nursing: Pt currently presents with a depressed affect and less anxious behavior. Pt attends groups and interacts positively with peers.  Pt states "I am working on coping skills." Pt reports good sleep with current medication regimen. Pt provided with medications per providers orders. Pt's labs and vitals were monitored throughout the night. Pt given a 1:1 about emotional and mental status. Pt supported and encouraged to express concerns and questions. Pt educated on medications. Pt's safety ensured with 15 minute and environmental checks. Pt currently denies SI/HI and A/V hallucinations. Pt verbally agrees to seek staff if SI/HI or A/VH occurs and to consult with staff before acting on any harmful thoughts. Will continue POC.  Objective: On evaluation the patient reported: Patient states that she feels worse and that her detox has never been this bad. She continues to ask about increasing Clonidine to higher dose or frequency. Discussed with patient about  Clonidine and blood pressure lowering effects, and will be unable to increase CLonidine at this time.  States that she is eating/sleeping without difficulty; tolerating medications without adverse reactions. Her detox protocol was started yesterday however she continues to ruminate about her symptoms. She continues to be very receptive of advise and recommendations from medical staff. She is showing some insight at this and verbalizes the fact  she needs help.  Reports that she continues to attend/participate in group which is not helping her. Discussed with patient about grouping on her detox hall, and she has agreed.  States her goal for today is to figure out discharge plan, and looking into the future. At this time patient denies suicidal/self harming thoughts an psychosis. She continues to report compliance with medication at this time. Continues to endorse lots of abdominal pain, restlessness, diarrhea, tremors, muscle aches and anxiety.  She is eager to learn about naltrexone and vivtrol and prefers to consider this over anything that has any opiate activity. She is continues to ruminate about medication and withdrawal symptoms in which  Principal Problem: Bipolar 1 disorder, mixed, severe ([place]) Diagnosis:   Patient Active Problem List   Diagnosis Date Noted  . Bipolar 1 disorder, mixed, severe ([place]) [F31.63] 10/09/2016  . Opioid use disorder, moderate, dependence ([place]) [F11.20] 06/06/2016  . Sedative, hypnotic or anxiolytic use disorder, severe, dependence ([place]) [F13.20] 06/06/2016  . Cocaine use disorder, severe, dependence ([place]) [F14.20] 06/06/2016  . Cannabis use disorder, severe, dependence ([place]) [F12.20] 06/06/2016  . MDD (major depressive disorder), recurrent severe, without psychosis ([place]) [F33.2] 06/03/2016  . Bipolar I disorder, most recent episode depressed ([place]) [F31.30] 07/04/2015  . Overdose [T50.901A] 07/02/2015  . Lactic acidosis [E87.2] 07/02/2015  . Acute respiratory failure ([place]) [J96.00] 07/02/2015  . Sepsis ([place]) [A41.9] 07/02/2015  . AKI (acute kidney injury) ([place]) [N17.9]   . Altered mental status [R41.82]   . Pyrexia [R50.9]   . Abdominal pain, epigastric [R10.13] 06/17/2015  . Hepatitis C antibody test positive [R76.8] 06/17/2015  .  Peptic ulcer disease [K27.9] 06/17/2015  . Epigastric pain [R10.13]   . Transaminitis [R74.0] 06/16/2015  . History of substance abuse [Z87.898] 06/16/2015  . Bipolar  affective disorder, current episode manic with psychotic symptoms ([place]) [F31.2] 03/03/2014   Total Time spent with patient: 30 minutes  Past Psychiatric History: see H&P , unchanged  Past Medical History:  Past Medical History:  Diagnosis Date  . Bipolar 1 disorder ([place])   . Bronchitis   . GAD (generalized anxiety disorder)   . Hepatitis C antibody test positive 06/17/2015  . History of substance abuse 06/16/2015  . OCD (obsessive compulsive disorder)   . Peptic ulcer   . Polysubstance abuse   . PTSD (post-traumatic stress disorder)     Past Surgical History:  Procedure Laterality Date  . OTHER SURGICAL HISTORY     scar tissue removed from right ovary   . OTHER SURGICAL HISTORY     fallopian tube repair  . WISDOM TOOTH EXTRACTION     Family History:  Family History  Problem Relation Age of Onset  . Drug abuse Brother   . Suicidality Brother   . Cancer Other   . Depression Mother   . Drug abuse Mother   . Alcoholism Father   . Bipolar disorder Cousin    Family Psychiatric  History: see H&P, unchanged Social History:  History  Alcohol Use  . Yes    Comment: occ     History  Drug Use  . Types: Marijuana, Methamphetamines, MDMA (Ecstacy), Oxycodone, Benzodiazepines, Cocaine    Social History   Social History  . Marital status: Legally Separated    Spouse name: N/A  . Number of children: N/A  . Years of education: N/A   Social History Main Topics  . Smoking status: Current Every Day Smoker    Packs/day: 1.00    Years: 10.00    Types: Cigarettes  . Smokeless tobacco: Never Used  . Alcohol use Yes     Comment: occ  . Drug use: Yes    Types: Marijuana, Methamphetamines, MDMA (Ecstacy), Oxycodone, Benzodiazepines, Cocaine  . Sexual activity: Yes    Birth control/ protection: None   Other Topics Concern  . None   Social History Narrative  . None   Additional Social History:                         Sleep: Fair  Appetite:  Fair  Current  Medications: Current Facility-Administered Medications  Medication Dose Route Frequency Provider Last Rate Last Dose  . acetaminophen (TYLENOL) tablet 650 mg  650 mg Oral Q6H PRN Ethelene Hal, NP   650 mg at 10/16/16 3382  . alum & mag hydroxide-simeth (MAALOX/MYLANTA) 200-200-20 MG/5ML suspension 30 mL  30 mL Oral Q4H PRN Ethelene Hal, NP   30 mL at 10/15/16 1719  . carbamazepine (TEGRETOL) tablet 200 mg  200 mg Oral TID Ethelene Hal, NP   200 mg at 10/16/16 0749  . cephALEXin (KEFLEX) capsule 500 mg  500 mg Oral Q12H Withrow, John C, FNP   500 mg at 10/16/16 0749  . cloNIDine (CATAPRES) tablet 0.1 mg  0.1 mg Oral TID PRN Nanci Pina, FNP   0.1 mg at 10/16/16 0612  . dicyclomine (BENTYL) tablet 20 mg  20 mg Oral Q6H PRN Nanci Pina, FNP   20 mg at 10/16/16 0751  . gabapentin (NEURONTIN) capsule 900 mg  900 mg Oral TID Aundra Dubin, MD  900 mg at 10/16/16 0749  . hydrOXYzine (ATARAX/VISTARIL) tablet 50 mg  50 mg Oral Q6H PRN Ethelene Hal, NP   50 mg at 10/15/16 0802  . levothyroxine (SYNTHROID, LEVOTHROID) tablet 50 mcg  50 mcg Oral QAC breakfast Ethelene Hal, NP   50 mcg at 10/16/16 1448  . loperamide (IMODIUM) capsule 2-4 mg  2-4 mg Oral PRN Nanci Pina, FNP   2 mg at 10/16/16 0845  . magnesium hydroxide (MILK OF MAGNESIA) suspension 30 mL  30 mL Oral Daily PRN Ethelene Hal, NP      . methocarbamol (ROBAXIN) tablet 500 mg  500 mg Oral Q8H PRN Nanci Pina, FNP   500 mg at 10/16/16 0751  . mirtazapine (REMERON) tablet 45 mg  45 mg Oral QHS Aundra Dubin, MD   45 mg at 10/15/16 2107  . nicotine polacrilex (NICORETTE) gum 2 mg  2 mg Oral PRN Cobos, Myer Peer, MD   2 mg at 10/16/16 0751  . ondansetron (ZOFRAN-ODT) disintegrating tablet 4 mg  4 mg Oral Q6H PRN Lavina Hamman, Jonah Gingras S, FNP      . prazosin (MINIPRESS) capsule 2 mg  2 mg Oral QHS Aundra Dubin, MD   2 mg at 10/15/16 2107  . QUEtiapine (SEROQUEL)  tablet 200 mg  200 mg Oral BID Aundra Dubin, MD   200 mg at 10/16/16 0749  . QUEtiapine (SEROQUEL) tablet 400 mg  400 mg Oral QHS Ethelene Hal, NP   400 mg at 10/15/16 2107  . sertraline (ZOLOFT) tablet 50 mg  50 mg Oral Daily Aundra Dubin, MD   50 mg at 10/16/16 1856    Lab Results:  Results for orders placed or performed during the hospital encounter of 10/09/16 (from the past 48 hour(s))  Comprehensive metabolic panel     Status: Abnormal   Collection Time: 10/16/16  6:09 AM  Result Value Ref Range   Sodium 130 (L) 135 - 145 mmol/L   Potassium 4.7 3.5 - 5.1 mmol/L   Chloride 96 (L) 101 - 111 mmol/L   CO2 26 22 - 32 mmol/L   Glucose, Bld 89 65 - 99 mg/dL   BUN 11 6 - 20 mg/dL   Creatinine, Ser 0.70 0.44 - 1.00 mg/dL   Calcium 9.4 8.9 - 10.3 mg/dL   Total Protein 8.0 6.5 - 8.1 g/dL   Albumin 4.3 3.5 - 5.0 g/dL   AST 86 (H) 15 - 41 U/L   ALT 140 (H) 14 - 54 U/L   Alkaline Phosphatase 104 38 - 126 U/L   Total Bilirubin 0.6 0.3 - 1.2 mg/dL   GFR calc non Af Amer >60 >60 mL/min   GFR calc Af Amer >60 >60 mL/min    Comment: (NOTE) The eGFR has been calculated using the CKD EPI equation. This calculation has not been validated in all clinical situations. eGFR's persistently <60 mL/min signify possible Chronic Kidney Disease.    Anion gap 8 5 - 15    Comment: Performed at [facility], [place] [address] [place], [place] [address]  TSH     Status: None   Collection Time: 10/16/16  6:09 AM  Result Value Ref Range   TSH 0.711 0.350 - 4.500 uIU/mL    Comment: Performed by a 3rd Generation assay with a functional sensitivity of <=0.01 uIU/mL. Performed at [facility], [place] [address], [place], [place] [address]     Blood Alcohol level:  Lab Results  Component Value Date   ETH <5 10/07/2016   ETH <5 35/32/9924    Metabolic Disorder Labs: Lab Results  Component Value Date   HGBA1C 4.9 06/07/2016   MPG 94  06/07/2016   MPG 117 (H) 03/05/2014   Lab Results  Component Value Date   PROLACTIN 49.9 (H) 06/07/2016   Lab Results  Component Value Date   CHOL 173 06/07/2016   TRIG 152 (H) 06/07/2016   HDL 67 06/07/2016   CHOLHDL 2.6 06/07/2016   VLDL 30 06/07/2016   LDLCALC 76 06/07/2016   LDLCALC 130 (H) 03/05/2014    Physical Findings: AIMS: Facial and Oral Movements Muscles of Facial Expression: None, normal Lips and Perioral Area: None, normal Jaw: None, normal Tongue: None, normal,Extremity Movements Upper (arms, wrists, hands, fingers): None, normal Lower (legs, knees, ankles, toes): None, normal, Trunk Movements Neck, shoulders, hips: None, normal, Overall Severity Severity of abnormal movements (highest score from questions above): None, normal Incapacitation due to abnormal movements: None, normal Patient's awareness of abnormal movements (rate only patient's report): No Awareness, Dental Status Current problems with teeth and/or dentures?: No Does patient usually wear dentures?: No  CIWA:    COWS:  COWS Total Score: 6  Musculoskeletal: Strength & Muscle Tone: within normal limits Gait & Station: normal Patient leans: N/A  Psychiatric Specialty Exam: Physical Exam   Review of Systems  Constitutional: Negative.  Negative for fever, malaise/fatigue and weight loss.  HENT: Negative.  Negative for congestion, hearing loss and sore throat.   Eyes: Negative.  Negative for blurred vision and discharge.  Respiratory: Negative.  Negative for cough, shortness of breath and wheezing.   Cardiovascular: Negative.  Negative for chest pain and palpitations.  Gastrointestinal: Negative.  Negative for abdominal pain, heartburn, nausea and vomiting.  Genitourinary: Negative.  Negative for dysuria.  Musculoskeletal: Negative.  Negative for falls and myalgias.  Skin: Negative.  Negative for rash.  Neurological: Negative.  Negative for dizziness, seizures and loss of consciousness.   Endo/Heme/Allergies: Negative.  Negative for environmental allergies.  Psychiatric/Behavioral: Positive for depression, substance abuse and suicidal ideas. Negative for hallucinations and memory loss. The patient is nervous/anxious. The patient does not have insomnia.     Blood pressure 128/76, pulse 100, temperature 97.5 F (36.4 C), resp. rate 16, height '5\' 3"'  (1.6 m), weight 65.3 kg (144 lb), SpO2 100 %.Body mass index is 25.51 kg/m.  General Appearance: Casual and Guarded  Eye Contact:  Fair  Speech:  Clear and Coherent and Normal Rate  Volume:  Normal  Mood:  Anxious, Depressed and Dysphoric  Affect:  Congruent, Depressed and Labile  Thought Process:  Coherent, Goal Directed, Linear and Descriptions of Associations: Tangential  Orientation:  Full (Time, Place, and Person)  Thought Content:  Logical  Suicidal Thoughts:  No,  Homicidal Thoughts:  No  Memory:  Immediate;   Fair Recent;   Fair Remote;   Fair  Judgement:  Impaired  Insight:  Present  Psychomotor Activity:  Mannerisms  Concentration:  Concentration: Fair and Attention Span: Fair  Recall:  [employer] of Knowledge:  Fair  Language:  Fair  Akathisia:  No  Handed:  Right  AIMS (if indicated):     Assets:  Desire for Improvement  ADL's:  Intact  Cognition:  WNL  Sleep:  Number of Hours: 6    Treatment Plan Summary: Seroquel 200 mg qam, 200 mg qlunch, and 400 mg qhs Continue Tegretol 231m po TID for mood stabilization.  Prazosin  2 mg qhs Continue every 15 minute checks. Continue Neurontin 900 mg 3 times a day for mood stabilization Continue nicotine patch Continue Synthroid 50 g for hypothyroidism 1 every morning Increase Remeron 45 mg 1 at bedtime for depression Increase Zoloft 50 mg daily for depression Continue clonidine/cows opiate detox plan Continue to participate in therapeutic milieu  Nanci Pina, FNP 10/16/2016, 10:35 AM

## 2016-10-16 NOTE — Plan of Care (Signed)
Problem: Self-Concept: Goal: Level of anxiety will decrease Outcome: Not Progressing Pt rates anxiety 8/10 and remains medication-focused.

## 2016-10-16 NOTE — Progress Notes (Signed)
D: Becky [name] denied SI, HI, and AVH this a.m. Her mood has shifted a few times this morning. At first meeting she was joking with peers. Within an hour, she appeared somber and was complaining of a headache. She has since appeared sad, almost tearful at times. She has sought PRNs after multiple intervals, inquiring about when she could have them. Then, about 10 or 15 minutes before that time, she will ask for this writer and ask for her meds to be given early. On her self inventory form, she reported good sleep, good appetite, normal energy level, and good concentration. She rated her depression 4/10, hopelessness 4/10, and anxiety 8/10. She reported a headache that was unrelieved by Tylenol.   A: Meds given as ordered, including several PRNs (see MAR). Q15 safety checks maintained. Support/encouragement offered. Attempted to encourage her to call on non-pharmacological coping skills.  R: Pt remains free from harm and continues with treatment. Will continue to monitor for needs/safety.

## 2016-10-16 NOTE — Progress Notes (Signed)
Patient attended group and said that her day was a 7.  Her coping skills for today were Socializing, reading and walking.

## 2016-10-17 DIAGNOSIS — F411 Generalized anxiety disorder: Secondary | ICD-10-CM

## 2016-10-17 DIAGNOSIS — F1123 Opioid dependence with withdrawal: Secondary | ICD-10-CM

## 2016-10-17 LAB — PROLACTIN: Prolactin: 8.8 ng/mL (ref 4.8–23.3)

## 2016-10-17 LAB — HEMOGLOBIN A1C
Hgb A1c MFr Bld: 5.3 % (ref 4.8–5.6)
MEAN PLASMA GLUCOSE: 105 mg/dL

## 2016-10-17 MED ORDER — QUETIAPINE FUMARATE 200 MG PO TABS
200.0000 mg | ORAL_TABLET | Freq: Two times a day (BID) | ORAL | Status: DC
  Administered 2016-10-17 – 2016-10-18 (×2): 200 mg via ORAL
  Filled 2016-10-17: qty 1
  Filled 2016-10-17: qty 28
  Filled 2016-10-17: qty 1
  Filled 2016-10-17: qty 28
  Filled 2016-10-17 (×3): qty 1
  Filled 2016-10-17: qty 28

## 2016-10-17 MED ORDER — QUETIAPINE FUMARATE 200 MG PO TABS
200.0000 mg | ORAL_TABLET | ORAL | Status: DC
  Filled 2016-10-17 (×2): qty 1

## 2016-10-17 MED ORDER — HYDROXYZINE HCL 25 MG PO TABS
25.0000 mg | ORAL_TABLET | Freq: Four times a day (QID) | ORAL | Status: DC | PRN
  Filled 2016-10-17: qty 10

## 2016-10-17 MED ORDER — GABAPENTIN 300 MG PO CAPS
600.0000 mg | ORAL_CAPSULE | Freq: Three times a day (TID) | ORAL | Status: DC
  Administered 2016-10-17 – 2016-10-18 (×3): 600 mg via ORAL
  Filled 2016-10-17: qty 42
  Filled 2016-10-17 (×5): qty 2
  Filled 2016-10-17 (×2): qty 42

## 2016-10-17 NOTE — Progress Notes (Signed)
Recreation Therapy Notes  Date: 10/17/16 Time: 0930 Location: [address] Group Room  Group Topic: Stress Management  Goal Area(s) Addresses:  Patient will verbalize importance of using healthy stress management.  Patient will identify positive emotions associated with healthy stress management.   Behavioral Response: Engaged  Intervention: Stress Management  Activity :  LRT introduced the stress management technique of meditation.  LRT played a meditation on gratitude to allow patients to engage in the meditation.  Patients were to follow along as the meditation played to fully participate in the activity.   Education:  Stress Management, Discharge Planning.   Education Outcome: Acknowledges edcuation/In group clarification offered/Needs additional education  Clinical Observations/Feedback: Pt attended group.   Caroll RancherMarjette Tannisha Kenning[place], LRT/CTRS         Caroll RancherLindsay, Reeder Brisby A 10/17/2016 11:34 AM

## 2016-10-17 NOTE — Progress Notes (Addendum)
[facility] MD Progress Note  10/17/2016 12:05 PM Becky [name]  MRN:  [id number] Subjective:  Patient reports she feels partially improved compared to admission, but states she feels she is still experiencing symptoms of opiate WDL, mainly vague malaise, cramps, nausea . Denies vomiting . She denies suicidal ideations . Denies medication side effects .   Objective:  I have discussed case with treatment team and have met with patient. Patient is a 26 year old female, admitted on 5/28 related to opiate ( heroin) overdose. Reports history of mood disorder and of anxiety. At this time patient acknowledges feeling better than on admission, but focuses on residual, ongoing symptoms of opiate withdrawal- in particular reports nausea, no vomiting, vague aches, abdominal discomfort. She has been experiencing cravings . As discussed with staff, patient has been medication seeking /focused at times. At this time presents vaguely anxious, slightly irritable, denies SI, no psychotic symptoms.  Denies any active SI at this time, and speaks about potential disposition plans . Denies medication side effects .    Principal Problem: Bipolar 1 disorder, mixed, severe ([place]) Diagnosis:   Patient Active Problem List   Diagnosis Date Noted  . GAD (generalized anxiety disorder) [F41.1] 10/16/2016  . Bipolar 1 disorder, mixed, severe ([place]) [F31.63] 10/09/2016  . Opioid use disorder, moderate, dependence ([place]) [F11.20] 06/06/2016  . Sedative, hypnotic or anxiolytic use disorder, severe, dependence ([place]) [F13.20] 06/06/2016  . Cocaine use disorder, severe, dependence ([place]) [F14.20] 06/06/2016  . Cannabis use disorder, severe, dependence ([place]) [F12.20] 06/06/2016  . MDD (major depressive disorder), recurrent severe, without psychosis ([place]) [F33.2] 06/03/2016  . Bipolar I disorder, most recent episode depressed ([place]) [F31.30] 07/04/2015  . Overdose [T50.901A] 07/02/2015  . Lactic acidosis [E87.2] 07/02/2015  . Acute  respiratory failure ([place]) [J96.00] 07/02/2015  . Sepsis ([place]) [A41.9] 07/02/2015  . AKI (acute kidney injury) ([place]) [N17.9]   . Altered mental status [R41.82]   . Pyrexia [R50.9]   . Abdominal pain, epigastric [R10.13] 06/17/2015  . Hepatitis C antibody test positive [R76.8] 06/17/2015  . Peptic ulcer disease [K27.9] 06/17/2015  . Epigastric pain [R10.13]   . Transaminitis [R74.0] 06/16/2015  . History of substance abuse [Z87.898] 06/16/2015  . Bipolar affective disorder, current episode manic with psychotic symptoms ([place]) [F31.2] 03/03/2014   Total Time spent with patient: 20 minutes  Past Psychiatric History: see H&P , unchanged  Past Medical History:  Past Medical History:  Diagnosis Date  . Bipolar 1 disorder ([place])   . Bronchitis   . GAD (generalized anxiety disorder)   . Hepatitis C antibody test positive 06/17/2015  . History of substance abuse 06/16/2015  . OCD (obsessive compulsive disorder)   . Peptic ulcer   . Polysubstance abuse   . PTSD (post-traumatic stress disorder)     Past Surgical History:  Procedure Laterality Date  . OTHER SURGICAL HISTORY     scar tissue removed from right ovary   . OTHER SURGICAL HISTORY     fallopian tube repair  . WISDOM TOOTH EXTRACTION     Family History:  Family History  Problem Relation Age of Onset  . Drug abuse Brother   . Suicidality Brother   . Cancer Other   . Depression Mother   . Drug abuse Mother   . Alcoholism Father   . Bipolar disorder Cousin    Family Psychiatric  History: see H&P, unchanged Social History:  History  Alcohol Use  . Yes    Comment: occ     History  Drug  Use  . Types: Marijuana, Methamphetamines, MDMA (Ecstacy), Oxycodone, Benzodiazepines, Cocaine    Social History   Social History  . Marital status: Legally Separated    Spouse name: N/A  . Number of children: N/A  . Years of education: N/A   Social History Main Topics  . Smoking status: Current Every Day Smoker    Packs/day:  1.00    Years: 10.00    Types: Cigarettes  . Smokeless tobacco: Never Used  . Alcohol use Yes     Comment: occ  . Drug use: Yes    Types: Marijuana, Methamphetamines, MDMA (Ecstacy), Oxycodone, Benzodiazepines, Cocaine  . Sexual activity: Yes    Birth control/ protection: None   Other Topics Concern  . None   Social History Narrative  . None   Additional Social History:   Sleep: improving   Appetite:  Fair  Current Medications: Current Facility-Administered Medications  Medication Dose Route Frequency Provider Last Rate Last Dose  . acetaminophen (TYLENOL) tablet 650 mg  650 mg Oral Q6H PRN Ethelene Hal, NP   650 mg at 10/16/16 2030  . alum & mag hydroxide-simeth (MAALOX/MYLANTA) 200-200-20 MG/5ML suspension 30 mL  30 mL Oral Q4H PRN Ethelene Hal, NP   30 mL at 10/15/16 1719  . carbamazepine (TEGRETOL) tablet 200 mg  200 mg Oral TID Ethelene Hal, NP   200 mg at 10/17/16 1200  . cephALEXin (KEFLEX) capsule 500 mg  500 mg Oral Q12H Benjamine Mola, FNP   500 mg at 10/17/16 9179  . cloNIDine (CATAPRES) tablet 0.1 mg  0.1 mg Oral TID PRN Nanci Pina, FNP   0.1 mg at 10/17/16 0552  . dicyclomine (BENTYL) tablet 20 mg  20 mg Oral Q6H PRN Nanci Pina, FNP   20 mg at 10/17/16 0552  . gabapentin (NEURONTIN) capsule 900 mg  900 mg Oral TID Aundra Dubin, MD   900 mg at 10/17/16 1200  . hydrOXYzine (ATARAX/VISTARIL) tablet 50 mg  50 mg Oral Q6H PRN Ethelene Hal, NP   50 mg at 10/17/16 1120  . levothyroxine (SYNTHROID, LEVOTHROID) tablet 50 mcg  50 mcg Oral QAC breakfast Ethelene Hal, NP   50 mcg at 10/17/16 [phone]  . loperamide (IMODIUM) capsule 2-4 mg  2-4 mg Oral PRN Nanci Pina, FNP   2 mg at 10/17/16 0552  . magnesium hydroxide (MILK OF MAGNESIA) suspension 30 mL  30 mL Oral Daily PRN Ethelene Hal, NP      . methocarbamol (ROBAXIN) tablet 500 mg  500 mg Oral Q8H PRN Nanci Pina, FNP   500 mg at 10/17/16 0552   . mirtazapine (REMERON) tablet 45 mg  45 mg Oral QHS Aundra Dubin, MD   45 mg at 10/16/16 2141  . nicotine polacrilex (NICORETTE) gum 2 mg  2 mg Oral PRN Keimon Basaldua, Myer Peer, MD   2 mg at 10/17/16 0552  . ondansetron (ZOFRAN-ODT) disintegrating tablet 4 mg  4 mg Oral Q6H PRN Lavina Hamman, Takia S, FNP      . prazosin (MINIPRESS) capsule 2 mg  2 mg Oral QHS Aundra Dubin, MD   2 mg at 10/16/16 2142  . QUEtiapine (SEROQUEL) tablet 200 mg  200 mg Oral BID Aundra Dubin, MD   200 mg at 10/17/16 1200  . QUEtiapine (SEROQUEL) tablet 400 mg  400 mg Oral QHS Ethelene Hal, NP   400 mg at 10/16/16 2142  . sertraline (ZOLOFT) tablet 50  mg  50 mg Oral Daily Aundra Dubin, MD   50 mg at 10/17/16 1025    Lab Results:  Results for orders placed or performed during the hospital encounter of 10/09/16 (from the past 48 hour(s))  Comprehensive metabolic panel     Status: Abnormal   Collection Time: 10/16/16  6:09 AM  Result Value Ref Range   Sodium 130 (L) 135 - 145 mmol/L   Potassium 4.7 3.5 - 5.1 mmol/L   Chloride 96 (L) 101 - 111 mmol/L   CO2 26 22 - 32 mmol/L   Glucose, Bld 89 65 - 99 mg/dL   BUN 11 6 - 20 mg/dL   Creatinine, Ser 0.70 0.44 - 1.00 mg/dL   Calcium 9.4 8.9 - 10.3 mg/dL   Total Protein 8.0 6.5 - 8.1 g/dL   Albumin 4.3 3.5 - 5.0 g/dL   AST 86 (H) 15 - 41 U/L   ALT 140 (H) 14 - 54 U/L   Alkaline Phosphatase 104 38 - 126 U/L   Total Bilirubin 0.6 0.3 - 1.2 mg/dL   GFR calc non Af Amer >60 >60 mL/min   GFR calc Af Amer >60 >60 mL/min    Comment: (NOTE) The eGFR has been calculated using the CKD EPI equation. This calculation has not been validated in all clinical situations. eGFR's persistently <60 mL/min signify possible Chronic Kidney Disease.    Anion gap 8 5 - 15    Comment: Performed at [facility], [place] [address], [place], [place] [address]  TSH     Status: None   Collection Time: 10/16/16  6:09 AM  Result Value Ref  Range   TSH 0.711 0.350 - 4.500 uIU/mL    Comment: Performed by a 3rd Generation assay with a functional sensitivity of <=0.01 uIU/mL. Performed at [facility], [place] [address] [place], [place] [address]   Hemoglobin A1c     Status: None   Collection Time: 10/16/16  6:09 AM  Result Value Ref Range   Hgb A1c MFr Bld 5.3 4.8 - 5.6 %    Comment: (NOTE)         Pre-diabetes: 5.7 - 6.4         Diabetes: >6.4         Glycemic control for adults with diabetes: <7.0    Mean Plasma Glucose 105 mg/dL    Comment: (NOTE) Performed At: [facility] [place], [place] [id number] Lindon Romp MD [id number] Performed at [facility], [place] [address], [place], [place] [address]   Prolactin     Status: None   Collection Time: 10/16/16  6:09 AM  Result Value Ref Range   Prolactin 8.8 4.8 - 23.3 ng/mL    Comment: (NOTE) Performed At: [facility] [place], [place] [id number] Lindon Romp MD [id number] Performed at [facility], [place] [address] [place], [place] [address]   Lipid panel     Status: Abnormal   Collection Time: 10/16/16  6:09 AM  Result Value Ref Range   Cholesterol 207 (H) 0 - 200 mg/dL   Triglycerides 140 <150 mg/dL   HDL 104 >40 mg/dL   Total CHOL/HDL Ratio 2.0 RATIO   VLDL 28 0 - 40 mg/dL   LDL Cholesterol 75 0 - 99 mg/dL    Comment:        Total Cholesterol/HDL:CHD Risk Coronary Heart Disease Risk Table  Men   Women  1/2 Average Risk   3.4   3.3  Average Risk       5.0   4.4  2 X Average Risk   9.6   7.1  3 X Average Risk  23.4   11.0        Use the calculated Patient Ratio above and the CHD Risk Table to determine the patient's CHD Risk.        ATP III CLASSIFICATION (LDL):  <100     mg/dL   Optimal  100-129  mg/dL   Near or Above                    Optimal  130-159  mg/dL   Borderline  160-189  mg/dL   High  >190     mg/dL    Very High Performed at [place] [address], [place], [place] [address]     Blood Alcohol level:  Lab Results  Component Value Date   [facility] <5 10/07/2016   ETH <5 36/64/4034    Metabolic Disorder Labs: Lab Results  Component Value Date   HGBA1C 5.3 10/16/2016   MPG 105 10/16/2016   MPG 94 06/07/2016   Lab Results  Component Value Date   PROLACTIN 8.8 10/16/2016   PROLACTIN 49.9 (H) 06/07/2016   Lab Results  Component Value Date   CHOL 207 (H) 10/16/2016   TRIG 140 10/16/2016   HDL 104 10/16/2016   CHOLHDL 2.0 10/16/2016   VLDL 28 10/16/2016   LDLCALC 75 10/16/2016   LDLCALC 76 06/07/2016    Physical Findings: AIMS: Facial and Oral Movements Muscles of Facial Expression: None, normal Lips and Perioral Area: None, normal Jaw: None, normal Tongue: None, normal,Extremity Movements Upper (arms, wrists, hands, fingers): None, normal Lower (legs, knees, ankles, toes): None, normal, Trunk Movements Neck, shoulders, hips: None, normal, Overall Severity Severity of abnormal movements (highest score from questions above): None, normal Incapacitation due to abnormal movements: None, normal Patient's awareness of abnormal movements (rate only patient's report): No Awareness, Dental Status Current problems with teeth and/or dentures?: No Does patient usually wear dentures?: No  CIWA:    COWS:  COWS Total Score: 0  Musculoskeletal: Strength & Muscle Tone: within normal limits- does not appear to be in any acute distress , no tremors, no diaphoresis  Gait & Station: normal Patient leans: N/A  Psychiatric Specialty Exam: Physical Exam  Review of Systems  Constitutional: Negative.  Negative for fever, malaise/fatigue and weight loss.  HENT: Negative.  Negative for congestion, hearing loss and sore throat.   Eyes: Negative.  Negative for blurred vision and discharge.  Respiratory: Negative.  Negative for cough, shortness of breath and wheezing.    Cardiovascular: Negative.  Negative for chest pain and palpitations.  Gastrointestinal: Negative.  Negative for abdominal pain, heartburn, nausea and vomiting.  Genitourinary: Negative.  Negative for dysuria.  Musculoskeletal: Negative.  Negative for falls and myalgias.  Skin: Negative.  Negative for rash.  Neurological: Negative.  Negative for dizziness, seizures and loss of consciousness.  Endo/Heme/Allergies: Negative.  Negative for environmental allergies.  Psychiatric/Behavioral: Positive for depression, substance abuse and suicidal ideas. Negative for hallucinations and memory loss. The patient is nervous/anxious. The patient does not have insomnia.   describes diffuse aches, cramps, nausea, which she attributes to persistent  opiate WDL symptoms  Blood pressure 119/68, pulse (!) 105, temperature 97.9 F (36.6 C), temperature source Oral, resp. rate 16, height _0  (1.6  m), weight 65.3 kg (144 lb), SpO2 100 %.Body mass index is 25.51 kg/m.  General Appearance: Fairly Groomed  Eye Contact:  Good  Speech:  Normal Rate  Volume:  Normal  Mood:  Anxious and Depressed  Affect:  anxious, congruent   Thought Process:  Linear and Descriptions of Associations: Intact  Orientation:  Full (Time, Place, and Person)  Thought Content:  linear, associations intact  Suicidal Thoughts:  No, denies any active suicidal or self injurious ideations at this time  Homicidal Thoughts:  No  Memory: recent and remote grossly intact   Judgement:  Fair- improving   Insight:  Fair improving   Psychomotor Activity:  Normal no tremors or restlessness   Concentration:  Concentration: Good and Attention Span: Good  Recall:  [place] of Knowledge:  Good  Language:  Good  Akathisia:  Negative  Handed:  Right  AIMS (if indicated):     Assets:  Desire for Improvement Resilience  ADL's:  Intact  Cognition:  WNL  Sleep:  Number of Hours: 6.75   Assessment - patient presents partially improved compared to  admission. She does continue to report anxiety and some depression, but denies any suicidal ideations at present. She reports some lingering symptoms of opiate WDL, and is concerned about discontinuing clonidine PRNs, as she feels these are working for her.  Denies medication side effects.  Treatment Plan Summary: Treatment plan reviewed today as below 6/4  Continue to encourage group and milieu participation to work on coping skills and symptom reduction Continue to encourage efforts to work on early recovery and relapse prevention Treatment team working on disposition planning options , I have encouraged patient to consider rehab referral if feasible  Decrease Seroquel to 200 mg qam and 400 mg qhs Continue Tegretol 230m tid or mood stabilization.  Continue Prazosin 2 mg qhs for nightmares  Decrease  Neurontin to 600 mg tid for pain and anxiety Continue Synthroid 50 g for hypothyroidism 1 every morning Continue  Remeron 45 mg qhs  for depression Continue  Zoloft 50 mg daily for depression Continue clonidine PRNs for opiate withdrawal symptoms Check BMP- ( to monitor Na+) and Carbamazepine serum level in AM  FJenne Campus MD 10/17/2016, 12:05 PM   Patient ID: Becky [name] female   DOB: [birth year] 26y.o.   MRN: [id number]

## 2016-10-17 NOTE — Progress Notes (Signed)
Patient ID: Becky PancoastDanielle [name], female   DOB: 12/03/1990, 26 y.o.   MRN: [id number]  Pt currently presents with an improved affect and less anxious behavior. Pt reports that she feels her medications are helping even though she has a "bad day today." Pt preoccupied with medications. Pt reports good sleep with current medication regimen.   Pt provided with medications per providers orders. Pt's labs and vitals were monitored throughout the night. Pt given a 1:1 about emotional and mental status. Pt supported and encouraged to express concerns and questions. Pt educated on medications.  Pt's safety ensured with 15 minute and environmental checks. Pt currently denies SI/HI and A/V hallucinations. Pt verbally agrees to seek staff if SI/HI or A/VH occurs and to consult with staff before acting on any harmful thoughts. Will continue POC.

## 2016-10-17 NOTE — [facility] Group Notes (Signed)
[facility] LCSW Group Therapy  10/17/2016 1:15pm  Type of Therapy:  Group Therapy vercoming Obstacles  Participation Level:  Active  Participation Quality:  Appropriate   Affect:  Appropriate  Cognitive:  Appropriate and Oriented  Insight:  Developing/Improving and Improving  Engagement in Therapy:  Improving  Modes of Intervention:  Discussion, Exploration, Problem-solving and Support  Description of Group:   In this group patients will be encouraged to explore what they see as obstacles to their own wellness and recovery. They will be guided to discuss their thoughts, feelings, and behaviors related to these obstacles. The group will process together ways to cope with barriers, with attention given to specific choices patients can make. Each patient will be challenged to identify changes they are motivated to make in order to overcome their obstacles. This group will be process-oriented, with patients participating in exploration of their own experiences as well as giving and receiving support and challenge from other group members.  Summary of Patient Progress: Pt participated appropriately in group discussion and is hopeful that she can remain clean in recovery.    Therapeutic Modalities:   Cognitive Behavioral Therapy Solution Focused Therapy Motivational Interviewing Relapse Prevention Therapy   Vernie ShanksLauren Haylen Bellotti, LCSW 10/17/2016 4:28 PM

## 2016-10-17 NOTE — [facility] Group Notes (Signed)
[facility]   10/17/2016 4:19 PM  Participation Quality:  appropriate  Mood/Affect:  Appropriate  Plan for Discharge/Comments:  [facility] "that once/month shot".  CSW will assess for resources; however, patient has also been [facility] for continued care.  Provider accepts patients without insurance coverage  Transportation Means: friends  Supports: "I need to distance myself from negative people"; feels like she is triggered by family members who are not supportive  Becky [name]

## 2016-10-17 NOTE — Progress Notes (Addendum)
D: Patient's COWS protocol complete.  She continues to report withdrawal symptoms, however, feels she is ready for discharge.  She rates her depression as a 6; hopelessness as a 3; anxiety as a 9. She denies any thoughts of self harm.  She is less anxious and her affect is brighter.  She is sleeping and eating well.  She reports increased energy (hyper) and good concentration.  She states she "wants to talk to the doctor and discharge."  During groups it was noted she was disengaged and resistant to participation.  She continues to exhibit med seeking behavior and requests several prns. A: Continue to monitor medication management and MD orders.  Safety checks completed every 15 minutes per protocol.  Offer support and encouragement as needed. R: Patient has minimal interaction with staff, unless it is medication related.

## 2016-10-17 NOTE — Progress Notes (Signed)
Adult Psychoeducational Group Note  Date:  10/17/2016 Time:  9:44 PM  Group Topic/Focus:  Wrap-Up Group:   The focus of this group is to help patients review their daily goal of treatment and discuss progress on daily workbooks.  Participation Level:  Active  Participation Quality:  Appropriate  Affect:  Appropriate  Cognitive:  Alert  Insight: Appropriate  Engagement in Group:  Engaged  Modes of Intervention:  Discussion  Additional Comments:  Patient stated having a pretty good day.   [name] L Oz Gammel 10/17/2016, 9:44 PM

## 2016-10-18 LAB — BASIC METABOLIC PANEL
Anion gap: 9 (ref 5–15)
BUN: 17 mg/dL (ref 6–20)
CALCIUM: 9.7 mg/dL (ref 8.9–10.3)
CO2: 28 mmol/L (ref 22–32)
Chloride: 95 mmol/L — ABNORMAL LOW (ref 101–111)
Creatinine, Ser: 0.86 mg/dL (ref 0.44–1.00)
GFR calc Af Amer: >60 mL/min (ref >60)
GLUCOSE: 89 mg/dL (ref 65–99)
Potassium: 3.9 mmol/L (ref 3.5–5.1)
Sodium: 132 mmol/L — ABNORMAL LOW (ref 135–145)

## 2016-10-18 MED ORDER — CEPHALEXIN 500 MG PO CAPS: 500.0000 mg | ORAL_CAPSULE | Freq: Two times a day (BID) | ORAL | 0 refills | Status: DC

## 2016-10-18 MED ORDER — NICOTINE POLACRILEX 2 MG MT GUM: 2.0000 mg | CHEWING_GUM | OROMUCOSAL | 0 refills | Status: DC | PRN

## 2016-10-18 MED ORDER — MIRTAZAPINE 45 MG PO TABS: 45.0000 mg | ORAL_TABLET | Freq: Every day | ORAL | 0 refills | Status: DC

## 2016-10-18 MED ORDER — QUETIAPINE FUMARATE 400 MG PO TABS: 400.0000 mg | ORAL_TABLET | Freq: Every day | ORAL | 0 refills | Status: DC

## 2016-10-18 MED ORDER — PRAZOSIN HCL 2 MG PO CAPS: 2.0000 mg | ORAL_CAPSULE | Freq: Every day | ORAL | 0 refills | Status: DC

## 2016-10-18 MED ORDER — SERTRALINE HCL 50 MG PO TABS: 50.0000 mg | ORAL_TABLET | Freq: Every day | ORAL | 0 refills | Status: DC

## 2016-10-18 MED ORDER — GABAPENTIN 300 MG PO CAPS: 600.0000 mg | ORAL_CAPSULE | Freq: Three times a day (TID) | ORAL | 0 refills | Status: DC

## 2016-10-18 MED ORDER — QUETIAPINE FUMARATE 200 MG PO TABS: 200.0000 mg | ORAL_TABLET | Freq: Two times a day (BID) | ORAL | 0 refills | Status: DC

## 2016-10-18 MED ORDER — LEVOTHYROXINE SODIUM 50 MCG PO TABS: 50.0000 ug | ORAL_TABLET | Freq: Every day | ORAL | 0 refills | Status: DC

## 2016-10-18 MED ORDER — CARBAMAZEPINE 200 MG PO TABS: 200.0000 mg | ORAL_TABLET | Freq: Three times a day (TID) | ORAL | 0 refills | Status: DC

## 2016-10-18 MED ORDER — HYDROXYZINE HCL 25 MG PO TABS: ORAL_TABLET | ORAL | 0 refills | Status: DC

## 2016-10-18 NOTE — [facility] Suicide Risk Assessment (Addendum)
[facility]   Principal Problem: Bipolar 1 disorder, mixed, severe (HCC) Discharge Diagnoses:  Patient Active Problem List   Diagnosis Date Noted  . GAD (generalized anxiety disorder) [F41.1] 10/16/2016  . Bipolar 1 disorder, mixed, severe (HCC) [F31.63] 10/09/2016  . Opioid use disorder, moderate, dependence (HCC) [F11.20] 06/06/2016  . Sedative, hypnotic or anxiolytic use disorder, severe, dependence (HCC) [F13.20] 06/06/2016  . Cocaine use disorder, severe, dependence (HCC) [F14.20] 06/06/2016  . Cannabis use disorder, severe, dependence (HCC) [F12.20] 06/06/2016  . MDD (major depressive disorder), recurrent severe, without psychosis (HCC) [F33.2] 06/03/2016  . Bipolar I disorder, most recent episode depressed (HCC) [F31.30] 07/04/2015  . Overdose [T50.901A] 07/02/2015  . Lactic acidosis [E87.2] 07/02/2015  . Acute respiratory failure (HCC) [J96.00] 07/02/2015  . Sepsis (HCC) [A41.9] 07/02/2015  . AKI (acute kidney injury) (HCC) [N17.9]   . Altered mental status [R41.82]   . Pyrexia [R50.9]   . Abdominal pain, epigastric [R10.13] 06/17/2015  . Hepatitis C antibody test positive [R76.8] 06/17/2015  . Peptic ulcer disease [K27.9] 06/17/2015  . Epigastric pain [R10.13]   . Transaminitis [R74.0] 06/16/2015  . History of substance abuse [Z87.898] 06/16/2015  . Bipolar affective disorder, current episode manic with psychotic symptoms (HCC) [F31.2] 03/03/2014    Total Time spent with patient: 30 minutes  Musculoskeletal: Strength & Muscle Tone: within normal limits Gait & Station: normal Patient leans: N/A  Psychiatric Specialty Exam: ROS mild headache, no chest pain, no dyspnea, no vomiting , no diarrhea   Blood pressure 119/76, pulse (!) 101, temperature 97.8 F (36.6 C), temperature source Oral, resp. rate 16, height 5\' 3"  (1.6 m), weight 65.3 kg (144 lb), SpO2 100 %.Body mass index is 25.51 kg/m.  General Appearance: Well Groomed  Eye Contact::   Good  Speech:  Normal Rate409  Volume:  Normal  Mood:  reports her mood is " kind of better".   Affect:  anxious, but reactive   Thought Process:  Linear and Descriptions of Associations: Intact  Orientation:  Other:  fully alert and attentive   Thought Content:  no hallucinations, no delusions, not internally preoccupied   Suicidal Thoughts:  No denies suicidal or self injurious ideations, no homicidal or violent ideations   Homicidal Thoughts:  No  Memory:  recent and remote grossly intact   Judgement:  Other:  improving   Insight:  fair- improving   Psychomotor Activity:  Normal  Concentration:  Good  Recall:  Good  Fund of Knowledge:Good  Language: Good  Akathisia:  No  Handed:  Right  AIMS (if indicated):     Assets:  Desire for Improvement Resilience Social Support  Sleep:  Number of Hours: 6.25  Cognition: WNL  ADL's:  Intact   Mental Status Per Nursing Assessment::   On Admission:     Demographic Factors:  26 year old separated female, has one child, who is in foster care, patient plans to live with a friend. Patient is employed   Loss Factors: Increased drug abuse, recent accidental overdose   Historical Factors: History of substance dependence- identifies opiates as substance of choice , history of depression, history of PTSD symptoms   Risk Reduction Factors:   Employed, Living with another person, especially a relative and Positive coping skills or problem solving skills  Continued Clinical Symptoms:  At this time patient is alert, attentive, improved grooming, reports she is feeling better, states mood is "OK", affect is anxious, but reactive , . No thought disorder, no suicidal or  self injurious ideations, no homicidal or violent ideations, no psychotic symptoms. Reports minimal ongoing symptoms of WDL, and does not present in any acute distress or discomfort at this time. She states she has found out that her mother is currently in hospital and that a  friend of hers is medically as well, so that she feels they need her support. She is requesting discharge today. Medication side effects reviewed- we have also reviewed possible teratogenic side effects of carbamazepine .   Cognitive Features That Contribute To Risk:  No gross cognitive deficits noted upon discharge. Is alert , attentive, and oriented x 3   Suicide Risk:  Mild:  Suicidal ideation of limited frequency, intensity, duration, and specificity.  There are no identifiable plans, no associated intent, mild dysphoria and related symptoms, good self-control (both objective and subjective assessment), few other risk factors, and identifiable protective factors, including available and accessible social support.  Follow-up Information    [facility].   Specialty:  Behavioral Health Why:  Please walk in for your initial assessment for suboxone treatments. Walk-in hours are from 12:00pm-3:00pm Mondays, Wednesdays, and Fridays. Contact information: [address] [place] [address] [phone]        [facility] up on 10/20/2016.   Why:  Hospital follow-up appointment @ 8am. Please bring proof of income if you have it. Contact information: [address] [place] [address] [phone]           Plan Of Care/Follow-up recommendations:  Activity:  as tolerated  Diet:  Regular Tests:  NA Other:  See below  Patient is requesting discharge and there are no current grounds for involuntary commitment  She is planning to follow up as above  Encouraged to avoid people, places and things she associates with drug use in order to decrease triggers for relapse, and to attend 12 step meetings regularly  Patient encouraged to follow up with PCP for medical management and follow up as needed .Marland Kitchen  Craige Cotta, MD 10/18/2016, 10:52 AM

## 2016-10-18 NOTE — Progress Notes (Signed)
Patient ID: [name], female   DOB: 06/16/1990, 26 y.o.   MRN: [id number] Patient discharged to home/self care in the company of her employer.  Patient stated that she was ready to return home and return to work.  Patient denies SI, HI, and AVH and plans to continue drug treatment in the community. Patient acknowledged understanding of all discharge instructions and receipt of all of her belongings.

## 2016-10-18 NOTE — [facility] Group Notes (Signed)

## 2016-10-18 NOTE — Progress Notes (Signed)
Becky [name]. Becky [name] had been up and visible in milieu this evening, she voiced her frustrations about her medications as well as her discharge plan and spoke about needing to get to a treatment facility or she will relapse if she goes back home because there are drugs all around her. She has endorsed anxiety throughout the evening and did request and receive medications without incident. A. Support and encouragement provided. R. Safety maintained, will continue to monitor.

## 2016-10-18 NOTE — [facility] Suicide Risk Assessment (Signed)
[facility] INPATIENT:  Family/Significant Other Suicide Prevention Education  Suicide Prevention Education:  Education Completed; Burnis KingfisherVickie Martin (aunt [phone]), has been identified by the patient as the family member/significant other with whom the patient will be residing, and identified as the person(s) who will aid the patient in the event of a mental health crisis (suicidal ideations/suicide attempt).  With written consent from the patient, the family member/significant other has been provided the following suicide prevention education, prior to the and/or following the discharge of the patient.  The suicide prevention education provided includes the following:  Suicide risk factors  Suicide prevention and interventions  National Suicide Hotline telephone number  [facility] assessment telephone number  [facility] Emergency Assistance 911  [facility] and/or Residential Mobile Crisis Unit telephone number  Request made of family/significant other to:  Remove weapons (e.g., guns, rifles, knives), all items previously/currently identified as safety concern.    Remove drugs/medications (over-the-counter, prescriptions, illicit drugs), all items previously/currently identified as a safety concern.  The family member/significant other verbalizes understanding of the suicide prevention education information provided.  The family member/significant other agrees to remove the items of safety concern listed above.  Jonathon JordanLynn B Royale Swamy, MSW, LCSWA 10/18/2016, 10:41 AM

## 2016-10-18 NOTE — Discharge Summary (Signed)
Physician Discharge Summary Note  Patient:  Becky [name] is an 26 y.o., female MRN:  [id number] DOB:  1990/05/21 Patient phone:  [phone] (home)  Patient address:   [address] [place] [address],  Total Time spent with patient: Greater than 30 minutes  Date of Admission:  10/09/2016 Date of Discharge: 10/18/2016   Reason for Admission: Heroin Overdose & Erratic behavior.    Principal Problem: Bipolar 1 disorder, mixed, severe [facility])  Discharge Diagnoses: Patient Active Problem List   Diagnosis Date Noted  . GAD (generalized anxiety disorder) [F41.1] 10/16/2016  . Bipolar 1 disorder, mixed, severe (HCC) [F31.63] 10/09/2016  . Opioid use disorder, moderate, dependence (HCC) [F11.20] 06/06/2016  . Sedative, hypnotic or anxiolytic use disorder, severe, dependence (HCC) [F13.20] 06/06/2016  . Cocaine use disorder, severe, dependence (HCC) [F14.20] 06/06/2016  . Cannabis use disorder, severe, dependence (HCC) [F12.20] 06/06/2016  . MDD (major depressive disorder), recurrent severe, without psychosis (HCC) [F33.2] 06/03/2016  . Bipolar I disorder, most recent episode depressed (HCC) [F31.30] 07/04/2015  . Overdose [T50.901A] 07/02/2015  . Lactic acidosis [E87.2] 07/02/2015  . Acute respiratory failure (HCC) [J96.00] 07/02/2015  . Sepsis (HCC) [A41.9] 07/02/2015  . AKI (acute kidney injury) (HCC) [N17.9]   . Altered mental status [R41.82]   . Pyrexia [R50.9]   . Abdominal pain, epigastric [R10.13] 06/17/2015  . Hepatitis C antibody test positive [R76.8] 06/17/2015  . Peptic ulcer disease [K27.9] 06/17/2015  . Epigastric pain [R10.13]   . Transaminitis [R74.0] 06/16/2015  . History of substance abuse [Z87.898] 06/16/2015  . Bipolar affective disorder, current episode manic with psychotic symptoms (HCC) [F31.2] 03/03/2014   Past Psychiatric History: GAD, Cocaine use disorder, Cannabis use disorder, BP  Past Medical History:  Past Medical History:  Diagnosis Date  .  Bipolar 1 disorder (HCC)   . Bronchitis   . GAD (generalized anxiety disorder)   . Hepatitis C antibody test positive 06/17/2015  . History of substance abuse 06/16/2015  . OCD (obsessive compulsive disorder)   . Peptic ulcer   . Polysubstance abuse   . PTSD (post-traumatic stress disorder)     Past Surgical History:  Procedure Laterality Date  . OTHER SURGICAL HISTORY     scar tissue removed from right ovary   . OTHER SURGICAL HISTORY     fallopian tube repair  . WISDOM TOOTH EXTRACTION     Family History:  Family History  Problem Relation Age of Onset  . Drug abuse Brother   . Suicidality Brother   . Cancer Other   . Depression Mother   . Drug abuse Mother   . Alcoholism Father   . Bipolar disorder Cousin    Family Psychiatric  History: See H&P  Social History:  History  Alcohol Use  . Yes    Comment: occ     History  Drug Use  . Types: Marijuana, Methamphetamines, MDMA (Ecstacy), Oxycodone, Benzodiazepines, Cocaine    Social History   Social History  . Marital status: Legally Separated    Spouse name: N/A  . Number of children: N/A  . Years of education: N/A   Social History Main Topics  . Smoking status: Current Every Day Smoker    Packs/day: 1.00    Years: 10.00    Types: Cigarettes  . Smokeless tobacco: Never Used  . Alcohol use Yes     Comment: occ  . Drug use: Yes    Types: Marijuana, Methamphetamines, MDMA (Ecstacy), Oxycodone, Benzodiazepines, Cocaine  . Sexual activity: Yes  Birth control/ protection: None   Other Topics Concern  . None   Social History Narrative  . None   Hospital Course: Becky [name] is a 26 y.o.female,was brought into the [facility] tonight by EMS after a 911 call from her boyfriend stating she had possibly overdosed on heroin. Once in the ED, pt injected more heroin in the bathroom per EDP. Per EDP, pt became erratic and agitated after being delayed from being discharged and EDP decided to IVC pt. Pt denies SI, HI, SHI and AVH.  Per pt record, pt has been previously diagnosed with Bipolar I D/O, PTSD, GAD and OCD. Pt was a poor historian due to sleepiness and irritability. Information for this assessment obtained from pt, EDP and pt record/hx. Pt denies SI and per hx, does not have a hx of SI. Pt denies AVH but, pt hx indicates that during other ED visits this year pt has been observed to be responding to internal stimuli and delusional with paranoia. Per pt record, pt has stated that she has AVH when she has "uppers" and/or cocaine. Pt sts she used today/tonight Heroin, Vicodin, Valium, Xanax, Cannabis and Cocaine.   After evaluation of her presenting symptoms, Becky [name] received Clonidine detox protocols for opioid detoxification treatment. She was also medicated & discharged on; Tegretol 200 mg for mood stabilization, Keflex 500 mg for infection, Hydroxyzine 25 mg prn for anxiety, Mirtazapine 45 mg for depression/anxiety, Nicorette gum for smoking cessation, Prazosin 2 mg for nightmares, Seroquel 200 mg & 400 mg respectively for mood control. Becky [name] was enrolled in the group counseling sessions being offered & held on this unit. She learned coping skills. She received other pertinent medications for the other medical issues presented. She tolerated her treatment regimen without any adverse effects or reactions reported.  Becky [name] has completed detoxification treatment & her mood is stable. She is seen today by her attending psychiatrist for discharge. She has normal anxiety about going into home. She is not overwhelmed by this. She is looking forward to working on her addiction. Not expressing any delusions today. No hallucination. Feels in control of herself. No passivity of thought. No passivity of will. No fantasy about suicide lately. No suicidal thoughts. No thoughts of violence. No craving for drugs. Does not feel depressed. No evidence of mania.  Nursing staff reports that patient has been appropriate on the unit. Patient  has been interacting well with peers. No behavioral issues. Patient has not voiced any suicidal thoughts. Patient has not been observed to be internally stimulated or preoccupied. Patient has been adherent with treatment recommendations. Patient has been tolerating her medication well. No reported adverse effects or reactions.   Patient was discussed at the treatment team meeting this morning. Team members feels that patient is back to her baseline level of function. Team agrees with plan to discharge patient today to continue mental health care on an outpatient basis as noted below. Becky [name] was provided with a 7 days worth, supply samples of her [facility] discharge medications. She left [facility] with all personal belongings in no apparent distress. Transportation her arrangement.    Physical Findings: AIMS: Facial and Oral Movements Muscles of Facial Expression: None, normal Lips and Perioral Area: None, normal Jaw: None, normal Tongue: None, normal,Extremity Movements Upper (arms, wrists, hands, fingers): None, normal Lower (legs, knees, ankles, toes): None, normal, Trunk Movements Neck, shoulders, hips: None, normal, Overall Severity Severity of abnormal movements (highest score from questions above): None, normal Incapacitation due to abnormal movements: None, normal Patient's awareness of  abnormal movements (rate only patient's report): No Awareness, Dental Status Current problems with teeth and/or dentures?: No Does patient usually wear dentures?: No  CIWA:    COWS:  COWS Total Score: 0  Musculoskeletal: Strength & Muscle Tone: within normal limits Gait & Station: normal Patient leans: N/A  Psychiatric Specialty Exam: Physical Exam  Nursing note and vitals reviewed. Constitutional: She is oriented to person, place, and time. She appears well-developed.  HENT:  Head: Normocephalic.  Eyes: Pupils are equal, round, and reactive to light.  Neck: Normal range of motion.  Cardiovascular:  Normal rate.   Respiratory: Effort normal.  GI: Soft.  Genitourinary:  Genitourinary Comments: Deferred  Musculoskeletal: Normal range of motion.  Neurological: She is alert and oriented to person, place, and time.  Skin: Skin is warm and dry.  Psychiatric: She has a normal mood and affect. Her speech is normal and behavior is normal. Thought content normal.    Review of Systems  Constitutional: Negative.   HENT: Negative.   Eyes: Negative.   Respiratory: Negative.   Cardiovascular: Negative.   Gastrointestinal: Negative.   Skin: Negative.   Neurological: Negative.   Psychiatric/Behavioral: Positive for depression (Stable) and substance abuse (Hx. Polysubstance use disorder). Negative for hallucinations, memory loss and suicidal ideas. The patient has insomnia (Stable). The patient is not nervous/anxious.     Blood pressure 119/76, pulse (!) 101, temperature 97.8 F (36.6 C), temperature source Oral, resp. rate 16, height 5\' 3"  (1.6 m), weight 65.3 kg (144 lb), SpO2 100 %.Body mass index is 25.51 kg/m.  See Md's SRA.  Have you used any form of tobacco in the last 30 days? (Cigarettes, Smokeless Tobacco, Cigars, and/or Pipes): Yes  Has this patient used any form of tobacco in the last 30 days? (Cigarettes, Smokeless Tobacco, Cigars, and/or Pipes): Yes, provided with a nicotine prescription upon discharge for smoking cessation.  Blood Alcohol level:  Lab Results  Component Value Date   ETH <5 10/07/2016   ETH <5 05/31/2016   Metabolic Disorder Labs:  Lab Results  Component Value Date   HGBA1C 5.3 10/16/2016   MPG 105 10/16/2016   MPG 94 06/07/2016   Lab Results  Component Value Date   PROLACTIN 8.8 10/16/2016   PROLACTIN 49.9 (H) 06/07/2016   Lab Results  Component Value Date   CHOL 207 (H) 10/16/2016   TRIG 140 10/16/2016   HDL 104 10/16/2016   CHOLHDL 2.0 10/16/2016   VLDL 28 10/16/2016   LDLCALC 75 10/16/2016   LDLCALC 76 06/07/2016   See Psychiatric  Specialty Exam and Suicide Risk Assessment completed by Attending Physician prior to discharge.  Discharge destination:  Home  Is patient on multiple antipsychotic therapies at discharge:  No   Has Patient had three or more failed trials of antipsychotic monotherapy by history:  No  Recommended Plan for Multiple Antipsychotic Therapies: NA  Allergies as of 10/18/2016      Reactions   Azithromycin Anaphylaxis   Nsaids Other (See Comments)   Peptic ulcers      Medication List    STOP taking these medications   benztropine 1 MG tablet Commonly known as:  COGENTIN   citalopram 20 MG tablet Commonly known as:  CELEXA   doxepin 25 MG capsule Commonly known as:  SINEQUAN   pantoprazole 20 MG tablet Commonly known as:  PROTONIX     TAKE these medications     Indication  carbamazepine 200 MG tablet Commonly known as:  TEGRETOL Take 1 tablet (  200 mg total) by mouth 3 (three) times daily. For mood stabilizatyion What changed:  additional instructions  Indication:  Mood stabilization   cephALEXin 500 MG capsule Commonly known as:  KEFLEX Take 1 capsule (500 mg total) by mouth every 12 (twelve) hours. For infection  Indication:  Infection   gabapentin 300 MG capsule Commonly known as:  NEURONTIN Take 2 capsules (600 mg total) by mouth 3 (three) times daily. For agitation What changed:  additional instructions  Indication:  Agitation   hydrOXYzine 25 MG tablet Commonly known as:  ATARAX/VISTARIL Take 1 tablet (25 mg) three times daily as needed: for anxiety What changed:  medication strength  how much to take  how to take this  when to take this  reasons to take this  additional instructions  Indication:  Anxiety Neurosis   levothyroxine 50 MCG tablet Commonly known as:  SYNTHROID, LEVOTHROID Take 1 tablet (50 mcg total) by mouth daily before breakfast. For low thyroid Hormone Start taking on:  10/19/2016 What changed:  when to take this  additional  instructions  Indication:  Underactive Thyroid   mirtazapine 45 MG tablet Commonly known as:  REMERON Take 1 tablet (45 mg total) by mouth at bedtime. For depression/sleep What changed:  medication strength  how much to take  additional instructions  Indication:  Trouble Sleeping, Major Depressive Disorder   nicotine polacrilex 2 MG gum Commonly known as:  NICORETTE Take 1 each (2 mg total) by mouth as needed for smoking cessation.  Indication:  Nicotine Addiction   prazosin 2 MG capsule Commonly known as:  MINIPRESS Take 1 capsule (2 mg total) by mouth at bedtime. For nightmares  Indication:  PTSD related nightmares   QUEtiapine 400 MG tablet Commonly known as:  SEROQUEL Take 1 tablet (400 mg total) by mouth at bedtime. For mood control What changed:  additional instructions  Indication:  Mood control   QUEtiapine 200 MG tablet Commonly known as:  SEROQUEL Take 1 tablet (200 mg total) by mouth 2 (two) times daily. For agitation/mood control What changed:  medication strength  how much to take  when to take this  additional instructions  Indication:  Agitation/mood control   sertraline 50 MG tablet Commonly known as:  ZOLOFT Take 1 tablet (50 mg total) by mouth daily. For depression Start taking on:  10/19/2016  Indication:  Major Depressive Disorder      Follow-up Information    [facility], [facility] Follow up.   Specialty:  Behavioral Health Why:  Please walk in for your initial assessment for suboxone treatments. Walk-in hours are from 12:00pm-3:00pm Mondays, Wednesdays, and Fridays. Contact information: [address] [place] [address] 361 [phone]        [facility] Follow up on 10/20/2016.   Why:  Hospital follow-up appointment @ 8am. Please bring proof of income if you have it. If you need to cancel or reschedule your appointment, please call the [facility] office to do so. Contact information: [address] [place] [phone]        [facility] Follow up.   Why:  If you would not like to [facility], you can [facility] at [facility] as a walk-in. Walk-in hours are Mon 9a-12p, Tues 12p-3p, and Thurs 8a-11a.  Contact information: [address] [place] [address] [phone]          Follow-up recommendations: Activity:  As tolerated Diet: As recommended by your primary care doctor.  Keep all scheduled follow-up appointments as recommended.   Comments: Patient is instructed prior to discharge to: Take all medications as prescribed by his/her mental healthcare provider. Report any adverse effects and or reactions from the medicines to his/her outpatient provider promptly. Patient has been instructed & cautioned: To not engage in alcohol and or illegal drug use while on prescription medicines. In the event of worsening symptoms, patient is instructed to call the crisis hotline, 911 and or go to the nearest ED for appropriate evaluation and treatment of symptoms. To follow-up with his/her primary care provider for your other medical issues, concerns and or health care needs.    Signed: Armandina Stammer I, NP PMHNP-BC, FNP-BC 10/18/2016, 11:30 AM

## 2016-10-18 NOTE — Tx Team (Signed)
Interdisciplinary Treatment and Diagnostic Plan Update 10/18/2016 Time of Session: 9:30am  Becky [name]  MRN: [id number]  Principal Diagnosis: Bipolar 1 disorder, mixed, severe (HCC)  Secondary Diagnoses: Principal Problem:   Bipolar 1 disorder, mixed, severe (HCC) Active Problems:   History of substance abuse   MDD (major depressive disorder), recurrent severe, without psychosis (HCC)   Opioid use disorder, moderate, dependence (HCC)   GAD (generalized anxiety disorder)   Current Medications:  Current Facility-Administered Medications  Medication Dose Route Frequency Provider Last Rate Last Dose  . acetaminophen (TYLENOL) tablet 650 mg  650 mg Oral Q6H PRN Laveda Abbe, NP   650 mg at 10/18/16 0926  . alum & mag hydroxide-simeth (MAALOX/MYLANTA) 200-200-20 MG/5ML suspension 30 mL  30 mL Oral Q4H PRN Laveda Abbe, NP   30 mL at 10/15/16 1719  . carbamazepine (TEGRETOL) tablet 200 mg  200 mg Oral TID Laveda Abbe, NP   200 mg at 10/18/16 4098  . cephALEXin (KEFLEX) capsule 500 mg  500 mg Oral Q12H Withrow, John C, FNP   500 mg at 10/18/16 1191  . cloNIDine (CATAPRES) tablet 0.1 mg  0.1 mg Oral TID PRN Truman Hayward, FNP   0.1 mg at 10/18/16 0756  . dicyclomine (BENTYL) tablet 20 mg  20 mg Oral Q6H PRN Truman Hayward, FNP   20 mg at 10/18/16 0756  . gabapentin (NEURONTIN) capsule 600 mg  600 mg Oral TID Cobos, Rockey Situ, MD   600 mg at 10/18/16 4782  . hydrOXYzine (ATARAX/VISTARIL) tablet 25 mg  25 mg Oral Q6H PRN Cobos, Rockey Situ, MD      . levothyroxine (SYNTHROID, LEVOTHROID) tablet 50 mcg  50 mcg Oral QAC breakfast Laveda Abbe, NP   50 mcg at 10/18/16 0610  . loperamide (IMODIUM) capsule 2-4 mg  2-4 mg Oral PRN Truman Hayward, FNP   4 mg at 10/17/16 1934  . magnesium hydroxide (MILK OF MAGNESIA) suspension 30 mL  30 mL Oral Daily PRN Laveda Abbe, NP      . methocarbamol (ROBAXIN) tablet 500 mg  500 mg Oral Q8H PRN Truman Hayward, FNP   500 mg at 10/18/16 0756  . mirtazapine (REMERON) tablet 45 mg  45 mg Oral QHS Burnard Leigh, MD   45 mg at 10/17/16 2019  . nicotine polacrilex (NICORETTE) gum 2 mg  2 mg Oral PRN Cobos, Rockey Situ, MD   2 mg at 10/18/16 0808  . ondansetron (ZOFRAN-ODT) disintegrating tablet 4 mg  4 mg Oral Q6H PRN Darcella Gasman, Takia S, FNP      . prazosin (MINIPRESS) capsule 2 mg  2 mg Oral QHS Burnard Leigh, MD   2 mg at 10/17/16 2020  . QUEtiapine (SEROQUEL) tablet 200 mg  200 mg Oral BID Cobos, Rockey Situ, MD   200 mg at 10/18/16 0752  . QUEtiapine (SEROQUEL) tablet 400 mg  400 mg Oral QHS Laveda Abbe, NP   400 mg at 10/17/16 2019  . sertraline (ZOLOFT) tablet 50 mg  50 mg Oral Daily Burnard Leigh, MD   50 mg at 10/18/16 9562    PTA Medications: Prescriptions Prior to Admission  Medication Sig Dispense Refill Last Dose  . benztropine (COGENTIN) 1 MG tablet Take 1 tablet (1 mg total) by mouth at bedtime. (Patient not taking: Reported on 10/07/2016) 30 tablet 0 Not Taking at Unknown time  . carbamazepine (TEGRETOL) 200 MG tablet Take 200 mg by mouth 3 (three)  times daily.   Not Taking at Unknown time  . citalopram (CELEXA) 20 MG tablet Take 20 mg by mouth daily.  1 Not Taking at Unknown time  . doxepin (SINEQUAN) 25 MG capsule Take 1 capsule (25 mg total) by mouth at bedtime. (Patient not taking: Reported on 10/07/2016) 30 capsule 0 Not Taking at Unknown time  . gabapentin (NEURONTIN) 300 MG capsule Take 2 capsules (600 mg total) by mouth 3 (three) times daily. (Patient not taking: Reported on 10/07/2016) 180 capsule 0 Not Taking at Unknown time  . hydrOXYzine (ATARAX/VISTARIL) 50 MG tablet Take 1 tablet (50 mg total) by mouth every 6 (six) hours as needed for anxiety (CIWA LESS THAN 10). (Patient not taking: Reported on 10/07/2016) 30 tablet 0 Not Taking at Unknown time  . levothyroxine (SYNTHROID, LEVOTHROID) 50 MCG tablet Take 1 tablet (50 mcg total) by mouth every  morning. (Patient not taking: Reported on 10/07/2016) 30 tablet 0 Not Taking at Unknown time  . mirtazapine (REMERON) 30 MG tablet Take 1 tablet (30 mg total) by mouth at bedtime. (Patient not taking: Reported on 10/07/2016) 30 tablet 0 Not Taking at Unknown time  . nicotine polacrilex (NICORETTE) 2 MG gum Take 1 each (2 mg total) by mouth as needed for smoking cessation. (Patient not taking: Reported on 10/07/2016) 100 tablet 0 Not Taking at Unknown time  . pantoprazole (PROTONIX) 20 MG tablet Take 1 tablet (20 mg total) by mouth daily. (Patient not taking: Reported on 10/07/2016) 30 tablet 0 Not Taking at Unknown time  . QUEtiapine (SEROQUEL) 300 MG tablet Take 1 tablet (300 mg total) by mouth 2 (two) times daily at 7 am and 12 noon. (Patient not taking: Reported on 10/07/2016) 30 tablet 0 Not Taking at Unknown time  . QUEtiapine (SEROQUEL) 400 MG tablet Take 1 tablet (400 mg total) by mouth at bedtime. (Patient not taking: Reported on 10/07/2016) 30 tablet 0 Not Taking at Unknown time    Treatment Modalities: Medication Management, Group therapy, Case management,  1 to 1 session with clinician, Psychoeducation, Recreational therapy.  Patient Stressors: Marital or family conflict Substance abuse Patient Strengths: Ability for insight Average or above average intelligence Capable of independent living General fund of knowledge Motivation for treatment/growth  Physician Treatment Plan for Primary Diagnosis: Bipolar 1 disorder, mixed, severe (HCC) Long Term Goal(s): Improvement in symptoms so as ready for discharge Short Term Goals: Ability to identify changes in lifestyle to reduce recurrence of condition will improve Ability to verbalize feelings will improve Ability to identify and develop effective coping behaviors will improve Compliance with prescribed medications will improve Ability to identify triggers associated with substance abuse/mental health issues will improve Ability to verbalize  feelings will improve Ability to identify and develop effective coping behaviors will improve Ability to maintain clinical measurements within normal limits will improve  Medication Management: Evaluate patient's response, side effects, and tolerance of medication regimen.  Therapeutic Interventions: 1 to 1 sessions, Unit Group sessions and Medication administration.  Evaluation of Outcomes: Adequate for Discharge  Physician Treatment Plan for Secondary Diagnosis: Principal Problem:   Bipolar 1 disorder, mixed, severe (HCC) Active Problems:   History of substance abuse   MDD (major depressive disorder), recurrent severe, without psychosis (HCC)   Opioid use disorder, moderate, dependence (HCC)   GAD (generalized anxiety disorder)  Long Term Goal(s): Improvement in symptoms so as ready for discharge  Short Term Goals: Ability to identify changes in lifestyle to reduce recurrence of condition will improve Ability to verbalize  feelings will improve Ability to identify and develop effective coping behaviors will improve Compliance with prescribed medications will improve Ability to identify triggers associated with substance abuse/mental health issues will improve Ability to verbalize feelings will improve Ability to identify and develop effective coping behaviors will improve Ability to maintain clinical measurements within normal limits will improve  Medication Management: Evaluate patient's response, side effects, and tolerance of medication regimen.  Therapeutic Interventions: 1 to 1 sessions, Unit Group sessions and Medication administration.  Evaluation of Outcomes: Adequate for Discharge  RN Treatment Plan for Primary Diagnosis: Bipolar 1 disorder, mixed, severe (HCC) Long Term Goal(s): Knowledge of disease and therapeutic regimen to maintain health will improve  Short Term Goals: Compliance with prescribed medications will improve  Medication Management: RN will administer  medications as ordered by provider, will assess and evaluate patient's response and provide education to patient for prescribed medication. RN will report any adverse and/or side effects to prescribing provider.  Therapeutic Interventions: 1 on 1 counseling sessions, Psychoeducation, Medication administration, Evaluate responses to treatment, Monitor vital signs and CBGs as ordered, Perform/monitor CIWA, COWS, AIMS and Fall Risk screenings as ordered, Perform wound care treatments as ordered.  Evaluation of Outcomes: Adequate for Discharge  LCSW Treatment Plan for Primary Diagnosis: Bipolar 1 disorder, mixed, severe (HCC) Long Term Goal(s): Safe transition to appropriate next level of care at discharge, Engage patient in therapeutic group addressing interpersonal concerns. Short Term Goals: Engage patient in aftercare planning with referrals and resources, Facilitate patient progression through stages of change regarding substance use diagnoses and concerns, Identify triggers associated with mental health/substance abuse issues and Increase skills for wellness and recovery  Therapeutic Interventions: Assess for all discharge needs, 1 to 1 time with Social worker, Explore available resources and support systems, Assess for adequacy in community support network, Educate family and significant other(s) on suicide prevention, Complete Psychosocial Assessment, Interpersonal group therapy.  Evaluation of Outcomes: Adequate for Discharge  Progress in Treatment: Attending groups: Intermittently Participating in groups: Yes  Taking medication as prescribed: Yes, MD continues to assess for medication changes as needed Toleration medication: Yes, no side effects reported at this time Family/Significant other contact made: Yes, pt's aunt contacted. Patient understands diagnosis: Limited insight  Discussing patient identified problems/goals with staff: Yes Medical problems stabilized or resolved:  Yes Denies suicidal/homicidal ideation: Yes Issues/concerns per patient self-inventory: None Other: N/A  New problem(s) identified: None identified at this time.   New Short Term/Long Term Goal(s): None identified at this time.   Discharge Plan or Barriers: Pt will return home and follow-up outpatient with [facility].  Reason for Continuation of Hospitalization:   None identified at this time.  Estimated Length of Stay: 0 days  Attendees: Patient: 10/18/2016 10:42 AM  Physician: Dr. Jama Flavorsobos 10/18/2016 10:42 AM  Nursing: Christen Bameonnie, RN; MayfieldBeverly ,RN 10/18/2016 10:42 AM  RN Care Manager:  10/18/2016 10:42 AM  Social Worker: Donnelly StagerLynn Hagen Bohorquez, LCSWA; Vernie ShanksLauren Newton, LCSW 10/18/2016 10:42 AM  Recreational Therapist:  10/18/2016 10:42 AM  Other: Armandina StammerAgnes Nwoko, NP 10/18/2016 10:42 AM  Other:  10/18/2016 10:42 AM  Other: 10/18/2016 10:42 AM   Scribe for Treatment Team: Jonathon JordanLynn B Dainel Arcidiacono, MSW, LCSWA

## 2016-10-18 NOTE — Progress Notes (Addendum)
  [facility] Case [facility] Discharge Plan :  Will you be returning to the same living situation after discharge:  Yes,  pt returning home. At discharge, do you have transportation home?: Yes,  pt has access to transportation. Do you have the ability to pay for your medications: Yes,  prescriptions and samples provided.  Release of information consent forms completed and in the chart;  Patient's signature needed at discharge.  Patient to Follow up at: Follow-up Information    [facility].   Specialty:  Behavioral Health Why:  Please walk in for your initial assessment for suboxone treatments. Walk-in hours are from 12:00pm-3:00pm Mondays, Wednesdays, and Fridays. Contact information: [address] [place] [address] [phone]        [facility] Follow up on 10/20/2016.   Why:  Hospital follow-up appointment @ 8am. Please bring proof of income if you have it. If you need to cancel or reschedule your appointment, please call the [facility] office to do so. Contact information: [address] [place] [id number] [phone]        [facility].   Why:  If you would not like to keep your [facility] appointment, you can be [facility] at [facility] as a walk-in. Walk-in hours are Mon 9a-12p, Tues 12p-3p, and Thurs 8a-11a.  Contact information: [address] [place] [address] [phone]           Next level of care provider has access to [facility] Link:no  Safety Planning and Suicide Prevention discussed: Yes,  with pt and with pt's aunt.  Have you used any form of tobacco in the last 30 days? (Cigarettes, Smokeless Tobacco, Cigars, and/or Pipes): Yes  Has patient been referred to the Quitline?: Patient refused referral  Patient has been referred for addiction treatment: Yes  Becky JordanLynn B Asna [name], MSW, LCSWA 10/18/2016, 10:54 AM

## 2016-10-19 LAB — CARBAMAZEPINE, FREE AND TOTAL
CARBAMAZEPINE FREE: 2.9 ug/mL (ref 0.6–4.2)
CARBAMAZEPINE, TOTAL: 12 ug/mL (ref 4.0–12.0)

## 2016-10-29 ENCOUNTER — Emergency Department (HOSPITAL_COMMUNITY)
Admission: EM | Admit: 2016-10-29 | Discharge: 2016-11-01 | Disposition: A | Discharge status: Other Acute Inpatient Hospital | Payer: Self-pay | Attending: Emergency Medicine | Admitting: Emergency Medicine

## 2016-10-29 ENCOUNTER — Encounter (HOSPITAL_COMMUNITY): Payer: Self-pay

## 2016-10-29 DIAGNOSIS — F1910 Other psychoactive substance abuse, uncomplicated: Secondary | ICD-10-CM

## 2016-10-29 DIAGNOSIS — F17210 Nicotine dependence, cigarettes, uncomplicated: Secondary | ICD-10-CM | POA: Insufficient documentation

## 2016-10-29 DIAGNOSIS — F319 Bipolar disorder, unspecified: Secondary | ICD-10-CM | POA: Insufficient documentation

## 2016-10-29 DIAGNOSIS — Z79899 Other long term (current) drug therapy: Secondary | ICD-10-CM | POA: Insufficient documentation

## 2016-10-29 DIAGNOSIS — R443 Hallucinations, unspecified: Secondary | ICD-10-CM | POA: Insufficient documentation

## 2016-10-29 DIAGNOSIS — F1920 Other psychoactive substance dependence, uncomplicated: Secondary | ICD-10-CM | POA: Insufficient documentation

## 2016-10-29 DIAGNOSIS — R4182 Altered mental status, unspecified: Secondary | ICD-10-CM | POA: Insufficient documentation

## 2016-10-29 LAB — CBC WITH DIFFERENTIAL/PLATELET
BASOS ABS: 0 10*3/uL (ref 0.0–0.1)
BASOS PCT: 0 %
EOS ABS: 0.1 10*3/uL (ref 0.0–0.7)
EOS PCT: 1 %
HEMATOCRIT: 36.4 % (ref 36.0–46.0)
Hemoglobin: 12.3 g/dL (ref 12.0–15.0)
Lymphocytes Relative: 22 %
Lymphs Abs: 2 10*3/uL (ref 0.7–4.0)
MCH: 30.2 pg (ref 26.0–34.0)
MCHC: 33.8 g/dL (ref 30.0–36.0)
MCV: 89.4 fL (ref 78.0–100.0)
MONO ABS: 0.5 10*3/uL (ref 0.1–1.0)
Monocytes Relative: 6 %
Neutro Abs: 6.2 10*3/uL (ref 1.7–7.7)
Neutrophils Relative %: 71 %
PLATELETS: 307 10*3/uL (ref 150–400)
RBC: 4.07 MIL/uL (ref 3.87–5.11)
RDW: 14.7 % (ref 11.5–15.5)
WBC: 8.9 10*3/uL (ref 4.0–10.5)

## 2016-10-29 LAB — URINALYSIS, ROUTINE W REFLEX MICROSCOPIC
BILIRUBIN URINE: NEGATIVE
Glucose, UA: NEGATIVE mg/dL
HGB URINE DIPSTICK: NEGATIVE
Ketones, ur: NEGATIVE mg/dL
Nitrite: NEGATIVE
PROTEIN: NEGATIVE mg/dL
SPECIFIC GRAVITY, URINE: 1.01 (ref 1.005–1.030)
pH: 6 (ref 5.0–8.0)

## 2016-10-29 LAB — RAPID URINE DRUG SCREEN, HOSP PERFORMED
AMPHETAMINES: NOT DETECTED
Barbiturates: NOT DETECTED
Benzodiazepines: POSITIVE — AB
COCAINE: POSITIVE — AB
OPIATES: POSITIVE — AB
TETRAHYDROCANNABINOL: POSITIVE — AB

## 2016-10-29 LAB — ETHANOL

## 2016-10-29 LAB — COMPREHENSIVE METABOLIC PANEL
ALBUMIN: 3.8 g/dL (ref 3.5–5.0)
ALK PHOS: 99 U/L (ref 38–126)
ALT: 93 U/L — ABNORMAL HIGH (ref 14–54)
AST: 48 U/L — AB (ref 15–41)
Anion gap: 8 (ref 5–15)
BILIRUBIN TOTAL: 0.4 mg/dL (ref 0.3–1.2)
BUN: 9 mg/dL (ref 6–20)
CALCIUM: 9 mg/dL (ref 8.9–10.3)
CO2: 24 mmol/L (ref 22–32)
CREATININE: 0.59 mg/dL (ref 0.44–1.00)
Chloride: 106 mmol/L (ref 101–111)
GFR calc Af Amer: >60 mL/min (ref >60)
GLUCOSE: 103 mg/dL — AB (ref 65–99)
Potassium: 3.7 mmol/L (ref 3.5–5.1)
Sodium: 138 mmol/L (ref 135–145)
TOTAL PROTEIN: 7.1 g/dL (ref 6.5–8.1)

## 2016-10-29 LAB — PREGNANCY, URINE: PREG TEST UR: NEGATIVE

## 2016-10-29 LAB — CARBAMAZEPINE LEVEL, TOTAL: CARBAMAZEPINE LVL: 2.5 ug/mL — AB (ref 4.0–12.0)

## 2016-10-29 MED ORDER — SERTRALINE HCL 50 MG PO TABS
50.0000 mg | ORAL_TABLET | Freq: Every day | ORAL | Status: DC
  Administered 2016-10-29 – 2016-11-01 (×4): 50 mg via ORAL
  Filled 2016-10-29 (×4): qty 1

## 2016-10-29 MED ORDER — QUETIAPINE FUMARATE 100 MG PO TABS
400.0000 mg | ORAL_TABLET | Freq: Every day | ORAL | Status: DC
  Administered 2016-10-29 – 2016-11-01 (×4): 400 mg via ORAL
  Filled 2016-10-29 (×4): qty 4

## 2016-10-29 MED ORDER — PRAZOSIN HCL 1 MG PO CAPS
ORAL_CAPSULE | ORAL | Status: AC
  Filled 2016-10-29: qty 2

## 2016-10-29 MED ORDER — LEVOTHYROXINE SODIUM 50 MCG PO TABS
50.0000 ug | ORAL_TABLET | Freq: Every day | ORAL | Status: DC
  Administered 2016-10-30 – 2016-11-01 (×3): 50 ug via ORAL
  Filled 2016-10-29 (×3): qty 1

## 2016-10-29 MED ORDER — MIRTAZAPINE 30 MG PO TABS
ORAL_TABLET | ORAL | Status: AC
  Filled 2016-10-29: qty 2

## 2016-10-29 MED ORDER — GABAPENTIN 300 MG PO CAPS
600.0000 mg | ORAL_CAPSULE | Freq: Three times a day (TID) | ORAL | Status: DC
  Administered 2016-10-29 – 2016-11-01 (×10): 600 mg via ORAL
  Filled 2016-10-29 (×10): qty 2

## 2016-10-29 MED ORDER — CARBAMAZEPINE 200 MG PO TABS
200.0000 mg | ORAL_TABLET | Freq: Three times a day (TID) | ORAL | Status: DC
  Administered 2016-10-29 – 2016-11-01 (×10): 200 mg via ORAL
  Filled 2016-10-29 (×10): qty 1

## 2016-10-29 MED ORDER — QUETIAPINE FUMARATE 100 MG PO TABS
200.0000 mg | ORAL_TABLET | Freq: Two times a day (BID) | ORAL | Status: DC
  Administered 2016-10-29 – 2016-10-31 (×4): 200 mg via ORAL
  Filled 2016-10-29 (×4): qty 2

## 2016-10-29 MED ORDER — PRAZOSIN HCL 1 MG PO CAPS
2.0000 mg | ORAL_CAPSULE | Freq: Every day | ORAL | Status: DC
  Administered 2016-10-29 – 2016-10-31 (×3): 2 mg via ORAL
  Filled 2016-10-29 (×4): qty 1

## 2016-10-29 MED ORDER — MIRTAZAPINE 15 MG PO TABS
45.0000 mg | ORAL_TABLET | Freq: Every day | ORAL | Status: DC
  Administered 2016-10-29 – 2016-11-01 (×4): 45 mg via ORAL
  Filled 2016-10-29 (×4): qty 1

## 2016-10-29 MED ORDER — LORAZEPAM 1 MG PO TABS
1.0000 mg | ORAL_TABLET | Freq: Once | ORAL | Status: AC
  Administered 2016-10-29: 1 mg via ORAL
  Filled 2016-10-29 (×2): qty 1

## 2016-10-29 MED ORDER — HYDROXYZINE HCL 25 MG PO TABS
25.0000 mg | ORAL_TABLET | Freq: Three times a day (TID) | ORAL | Status: DC | PRN
  Administered 2016-10-30 – 2016-11-01 (×4): 25 mg via ORAL
  Filled 2016-10-29 (×5): qty 1

## 2016-10-29 NOTE — ED Provider Notes (Addendum)
[facility]-EMER[facility]Y DEPT Provider Note   CSN: [id number] Arrival date & time: 10/29/16  1414     History   Chief Complaint Chief Complaint  Patient presents with  . Hallucinations    HPI Becky [name] is a 26 y.o. female.  HPI Level 5 caveat due to psychiatric disorder or intoxication. EMS was called to patient's house for mental status changes. Reportedly was catatonic upon arrival. Family member states she has been hallucinating. 11 days ago discharged from behavioral health Hospital for substance abuse and hallucinations at bedtime. History of bipolar disorder, anxiety disorder, and polysubstance abuse. Family members reportedly told EMS she has not used any illicit substances while they were with her. Patient had been nonverbal but reportedly became somewhat verbal when they got close to the hospital. Still will not speak to me but will not yes or no to some questions. 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 Hands have both cramped up will she was still awake and looking around. Past Medical History:  Diagnosis Date  . Bipolar 1 disorder (HCC)   . Bronchitis   . GAD (generalized anxiety disorder)   . Hepatitis C antibody test positive 06/17/2015  . History of substance abuse 06/16/2015  . OCD (obsessive compulsive disorder)   . Peptic ulcer   . Polysubstance abuse   . PTSD (post-traumatic stress disorder)     Patient Active Problem List   Diagnosis Date Noted  . GAD (generalized anxiety disorder) 10/16/2016  . Bipolar 1 disorder, mixed, severe (HCC) 10/09/2016  . Opioid use disorder, moderate, dependence (HCC) 06/06/2016  . 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 Cocaine use disorder, severe, dependence (HCC) 06/06/2016  . Cannabis use disorder, severe, dependence (HCC) 06/06/2016  . 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 Bipolar I disorder, most recent episode depressed (HCC)  07/04/2015  . Overdose 07/02/2015  . Lactic acidosis 07/02/2015  . Acute respiratory failure (HCC) 07/02/2015  . Sepsis (HCC) 07/02/2015  . AKI (acute kidney injury) (HCC)   . Altered mental status   . Pyrexia   . Abdominal pain, epigastric 06/17/2015  . Hepatitis C antibody test positive 06/17/2015  . Peptic ulcer disease 06/17/2015  . Epigastric pain   . Transaminitis 06/16/2015  . History of substance abuse 06/16/2015  . Bipolar affective disorder, current episode manic with psychotic symptoms (HCC) 03/03/2014    Past Surgical History:  Procedure Laterality Date  . OTHER SURGICAL HISTORY     scar tissue removed from right ovary   . OTHER SURGICAL HISTORY     fallopian tube repair  . WISDOM TOOTH EXTRACTION      OB History    Gravida Para Term Preterm AB Living   2       1 1    SAB TAB Ectopic Multiple Live Births                   Home Medications    Prior to Admission medications   Medication Sig Start Date End Date Taking? Authorizing Provider  carbamazepine (TEGRETOL) 200 MG tablet Take 1 tablet (200 mg total) by mouth 3 (three) times daily. For mood stabilizatyion 10/18/16   Armandina Stammer I, NP  cephALEXin (KEFLEX) 500 MG capsule Take 1 capsule (500 mg total) by mouth every 12 (twelve) hours. For infection 10/18/16   Armandina Stammer I, NP  gabapentin (NEURONTIN) 300 MG capsule Take 2 capsules (600 mg total) by mouth 3 (three) times daily. For agitation  10/18/16   Armandina Stammer I, NP  hydrOXYzine (ATARAX/VISTARIL) 25 MG tablet Take 1 tablet (25 mg) three times daily as needed: for anxiety 10/18/16   Armandina Stammer I, NP  levothyroxine (SYNTHROID, LEVOTHROID) 50 MCG tablet Take 1 tablet (50 mcg total) by mouth daily before breakfast. For low thyroid Hormone 10/19/16   Armandina Stammer I, NP  mirtazapine (REMERON) 45 MG tablet Take 1 tablet (45 mg total) by mouth at bedtime. For depression/sleep 10/18/16   Armandina Stammer I, NP  nicotine polacrilex (NICORETTE) 2 MG gum Take 1 each (2 mg total)  by mouth as needed for smoking cessation. 10/18/16   Armandina Stammer I, NP  prazosin (MINIPRESS) 2 MG capsule Take 1 capsule (2 mg total) by mouth at bedtime. For nightmares 10/18/16   Armandina Stammer I, NP  QUEtiapine (SEROQUEL) 200 MG tablet Take 1 tablet (200 mg total) by mouth 2 (two) times daily. For agitation/mood control 10/18/16   Armandina Stammer I, NP  QUEtiapine (SEROQUEL) 400 MG tablet Take 1 tablet (400 mg total) by mouth at bedtime. For mood control 10/18/16   Armandina Stammer I, NP  sertraline (ZOLOFT) 50 MG tablet Take 1 tablet (50 mg total) by mouth daily. For depression 10/19/16   Sanjuana Kava, NP    Family History Family History  Problem Relation Age of Onset  . Drug abuse Brother   . Suicidality Brother   . Cancer Other   . Depression Mother   . Drug abuse Mother   . Alcoholism Father   . Bipolar disorder Cousin     Social History Social History  Substance Use Topics  . Smoking status: Current Every Day Smoker    Packs/day: 1.00    Years: 10.00    Types: Cigarettes  . Smokeless tobacco: Never Used  . Alcohol use Yes     Comment: occ     Allergies   Azithromycin and Nsaids   Review of Systems Review of Systems  Unable to perform ROS: Mental status change     Physical Exam Updated Vital Signs BP 136/90 (BP Location: Left Arm)   Pulse 100   Temp 98.1 F (36.7 C) (Oral)   Resp 14   SpO2 100%   Physical Exam  Constitutional: She appears well-developed.  HENT:  Head: Atraumatic.  Eyes:  Pupils are dilated but reactive  Neck: Neck supple.  Cardiovascular:  Mild tachycardia  Pulmonary/Chest: Effort normal.  Abdominal: Soft. There is no tenderness.  Musculoskeletal: She exhibits no edema.  Neurological: She is alert.  Patient is awake and looks to voice. Will not yes or no to some questions. Moving all extremities. However she will not speak to me.  Skin: Capillary refill takes less than 2 seconds.  Psychiatric:  Behavior is not normal. Nonverbal.     ED  Treatments / Results  Labs (all labs ordered are listed, but only abnormal results are displayed) Labs Reviewed  URINALYSIS, ROUTINE W REFLEX MICROSCOPIC - Abnormal; Notable for the following:       Result Value   Leukocytes, UA SMALL (*)    Bacteria, UA RARE (*)    Squamous Epithelial / LPF 0-5 (*)    All other components within normal limits  RAPID URINE DRUG SCREEN, HOSP PERFORMED - Abnormal; Notable for the following:    Opiates POSITIVE (*)    Cocaine POSITIVE (*)    Benzodiazepines POSITIVE (*)    Tetrahydrocannabinol POSITIVE (*)    All other components within normal limits  COMPREHENSIVE METABOLIC  PANEL - Abnormal; Notable for the following:    Glucose, Bld 103 (*)    AST 48 (*)    ALT 93 (*)    All other components within normal limits  CARBAMAZEPINE LEVEL, TOTAL - Abnormal; Notable for the following:    Carbamazepine Lvl 2.5 (*)    All other components within normal limits  ETHANOL  CBC WITH DIFFERENTIAL/PLATELET  PREGNANCY, URINE    EKG  EKG Interpretation  Date/Time:  Saturday October 29 2016 14:18:16 EDT Ventricular Rate:  97 PR Interval:    QRS Duration: 92 QT Interval:  350 QTC Calculation: 445 R Axis:   80 Text Interpretation:  Sinus rhythm Short PR interval Borderline repolarization abnormality Confirmed by Rubin PayorPICKERING  MD, Harrold DonathNATHAN [phone] on 10/29/2016 2:26:41 PM       Radiology No results found.  Procedures Procedures (including critical care time)  Medications Ordered in ED Medications - No data to display   Initial Impression / Assessment and Plan / ED Course  I have reviewed the triage vital signs and the nursing notes.  Pertinent labs & imaging results that were available during my care of the patient were reviewed by me and considered in my medical decision making (see chart for details).     Patient brought in for mental status changes. Reports of hallucinations at home. Family denies substance abuse recently but has grossly positive  urine for several substances. Labs reassuring. To be seen by TTS. Medically cleared.  Final Clinical Impressions(s) / ED Diagnoses   Final diagnoses:  Polysubstance abuse  Altered mental status, unspecified altered mental status type    New Prescriptions New Prescriptions   No medications on file     Benjiman CorePickering, Taishaun Levels, MD 10/29/16 1545  TTS and psychiatry recommended inpatient treatment. Patient became more agitated when she found this out. Required IVC.    Benjiman CorePickering, Berton Butrick, MD 10/29/16 1736

## 2016-10-29 NOTE — ED Notes (Signed)
Pt is cooperative at this time. She ambulated to BR and provided urine specimen. Pt requested to use phone and call mother

## 2016-10-29 NOTE — ED Notes (Signed)
Pt ambulated to BR with staff.

## 2016-10-29 NOTE — ED Notes (Signed)
Officer at bedside and pt's right wrist is handcuffed to bed to prevent her from leaving the facility. Pt told officer that she want to leave and had been trying to leave.
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
## 2016-10-29 NOTE — ED Notes (Signed)
Pt refused to take ativan but states she will take her regular night time meds.

## 2016-10-29 NOTE — ED Notes (Signed)
Pt walking out of room with blank stare asking if that's her mother. Attempt to redirect. Required assistance of security to get pt back in her room. Tearful at times

## 2016-10-29 NOTE — ED Notes (Addendum)
IVC papers served   [employer] talking to pt

## 2016-10-29 NOTE — ED Notes (Signed)
Pt will answer simple questions with yes, no or I dont know. Says yes when asked if she hears voices, but will not say what voices are saying. Pt is continually moving arms and hands

## 2016-10-29 NOTE — ED Triage Notes (Signed)
Pt brought in by EMS. Family called stating pt had been hearing voices. She was dropped off by boyfriend at grandmothers house yesterday . Pt was sitting on couch non verbal. She was able to walk to stretcher and allow IV to be started . Once in truck had approx 9 episodes of "cramping up body" and then relaxing. Then began jerking off all wires , leads and IV. Pt currently will shake head yes or no when asked question

## 2016-10-29 NOTE — ED Notes (Signed)
Pt will not stay in room, she keeps looking around the department when she walks out of the room. Pt redirected multiple times and it keeps getting harder to keep her in the room. Pt is ivc and [place] police called to come back to sit with pt.

## 2016-10-30 MED ORDER — MIRTAZAPINE 30 MG PO TABS
ORAL_TABLET | ORAL | Status: AC
  Filled 2016-10-30: qty 2

## 2016-10-30 MED ORDER — PRAZOSIN HCL 1 MG PO CAPS
ORAL_CAPSULE | ORAL | Status: AC
  Filled 2016-10-30: qty 2

## 2016-10-30 NOTE — BH Assessment (Signed)
[facility] at capacity. Faxed clinical information to the following facilities for placement:  [facility]   [address]Becky [name] Ellis Patsy BaltimoreWarrick Jr, [facility], [facility], [facility] Triage Specialist [phone]

## 2016-10-30 NOTE — ED Notes (Signed)
Pt given crackers and peanut butter as well as water.

## 2016-10-30 NOTE — ED Notes (Signed)
Pt given lunch tray at this time. Upon entering room pt becomes tearful and starts calling out family members names. Pt calmed down with verbal de-escalation technique. Scientist, research (life sciences)itter and officer at bedside.

## 2016-10-30 NOTE — ED Notes (Addendum)
Pt blankly staring at nursing staff with blank look on her face. Appears to be responding to some internal stimuli. Sitting in bed with sitter and [place] LEO at bedside.

## 2016-10-30 NOTE — ED Notes (Signed)
Pt ambulatory with steady and even gait at this time to restroom, Statisticianofficer and sitter at side.

## 2016-10-30 NOTE — ED Notes (Signed)
Pt provided with dinner tray.

## 2016-10-30 NOTE — ED Notes (Addendum)
Patient ambulated to bathroom-tolerated well. Patient tried to walk toward EMS bay after using the restroom. Patient yelling "Excuse me. Let me go." Patient escorted back to room by [employer]. Patient yelling "help me." Patient right arm and leg handcuffed to bed. Patient anxious. PRN medication to be given. Patient given orange juice per request once calm.

## 2016-10-30 NOTE — BH Assessment (Signed)
[facility] Assessment Progress Note   Pt was reassessed this morning. Pt appeared to be frightened and hypervigilant throughout assessment. She continuously looked around and moved her hands erratically. She endorsed AVH but was unable to elaborate. "I see all kinds of shit." She appeared to be thought blocking. CSW spoke with sitter, she reports pt has demonstrated these behaviors all morning. Pt continues to meet criteria for inpatient admission. TTS will continue to seek placement.   Becky FloroCandace L Ashtynn Berke MSW, LCSWA  10/30/2016 9:17 AM

## 2016-10-30 NOTE — ED Notes (Signed)
Patient walked to restroom, patient stick up her middle fingers at officer once returning to room. Officer spoke with patient about her behavior. Patient refused to lay down or she would be handcuffed to bed.

## 2016-10-30 NOTE — ED Notes (Signed)
Patient awake at this time making hand gestures of sticking up her middle finger at new officer on duty. Patient cussing " Why people fucking with me, so fuck it all". Reminded patient that type of behavior will not be tolerated.

## 2016-10-30 NOTE — ED Notes (Signed)
Patient has right hand cuffed to bed and shackled right leg at this time.

## 2016-10-30 NOTE — ED Notes (Signed)
Patient walked to restroom. Patient refusing to come out of the bathroom. [name] [employer] Officer spoke with patient and directed back to room.

## 2016-10-30 NOTE — ED Notes (Signed)
Patient asking to use phone at this time. Reminded patient that its 0310 am and she could use the phone later this morning.

## 2016-10-30 NOTE — ED Notes (Signed)
Patient given water to drink at this time. Patient will nap about 5 to 10 minutes.

## 2016-10-30 NOTE — ED Notes (Signed)
Pt ambulatory to restroom at this time with sitter and LEO.

## 2016-10-30 NOTE — ED Notes (Signed)
Patient tearful. Patient asked what is upsetting her. Patient states "want to talk to mom." Patient offered portable phone but unable to give number or call. Portable phone left with [employer] for patient to use. Patient now calm and relaxed on stretcher.

## 2016-10-30 NOTE — ED Notes (Signed)
Pt did not eat lunch tray stating she was not hungry, offered pt other options to which she denied. Bedside cleaning performed.

## 2016-10-31 MED ORDER — OLANZAPINE 5 MG PO TBDP
5.0000 mg | ORAL_TABLET | Freq: Once | ORAL | Status: AC
  Administered 2016-10-31: 5 mg via ORAL
  Filled 2016-10-31: qty 1

## 2016-10-31 MED ORDER — LORAZEPAM 1 MG PO TABS
1.0000 mg | ORAL_TABLET | Freq: Once | ORAL | Status: AC
  Administered 2016-10-31: 1 mg via ORAL
  Filled 2016-10-31: qty 1

## 2016-10-31 MED ORDER — QUETIAPINE FUMARATE 100 MG PO TABS
200.0000 mg | ORAL_TABLET | Freq: Two times a day (BID) | ORAL | Status: DC
  Administered 2016-10-31 – 2016-11-01 (×3): 200 mg via ORAL
  Filled 2016-10-31 (×3): qty 2

## 2016-10-31 NOTE — ED Notes (Signed)
Pt ambulated to bathroom with officer and sitter, pt eye wandering around department. Moderate paranoia noted.

## 2016-10-31 NOTE — ED Notes (Signed)
Pt pulling the green tape off the drawers and wall sockets. [place] Pd and myself tried to redirect her with no success she kept pulling the tape off. [place] Pd cuffed her right hand to the bed. Nurse aware.

## 2016-10-31 NOTE — ED Notes (Signed)
Patient ambulated to bathroom with myself and [place] PD, pt was paranoid and seeing things in bathroom.  We took her to a different bathroom to try and calm her down she was crying and pointing. Wouldn't shut the door she stated "hes here" I reassured no one was in the bathroom. I stayed in bathroom with her. While walking back to room she had to be redirected several time by myself and [place] PD. She was resisting and attempted to run. We got her back to her room and she has shackles on right leg and handcuffs on right arm. Nurse aware

## 2016-10-31 NOTE — ED Notes (Signed)
Tele psych monitor at bedside 

## 2016-10-31 NOTE — BH Assessment (Signed)
[facility] Assessment Progress Note  Pt re-assessed this morning. Pt continues to appear psychotic AEB her eyes frantically darting around the room, her primarily unintelligible speech laced with a crying, whining voice and her direct acknowledgement of hearing voices. Pt admits to being scared but doesn't know why. She responds to most questions with "I don't know". She presents as extremely afraid. Clinician assured her she was safe in the hospital, but pt did not seem convinced. Pt continues to need IP treatment.   Johny ShockSamantha M. Ladona Ridgelaylor, MS, NCC, LPCA Counselor

## 2016-10-31 NOTE — ED Notes (Addendum)
Pt BH, A copy of [facility]C paperwork [facility]. [facility] also states that repeat bp and HR needs to be completed. [facility] aware of pt tearful outburst. [facility] recommended discussing ativan taper for pt with EDP. ED secretary faxing IVC paperwork at this time.  Sitter aware updated vitals needed. Pt currently agitated. Will obtain once patient is calm.

## 2016-10-31 NOTE — ED Notes (Signed)
Consulted pharmacist regarding administration of atarax. Pharmacist reviewed pt medication list and adjusted seroquel administration. Pharmacist reported to administer seroquel dose at 1600 and then if pt still anxious to administer atarax.

## 2016-10-31 NOTE — ED Notes (Signed)
Pt ambulated to the bathroom with myself and [place] PD, pt was sobbing while walking in hallway and pointing at staff.

## 2016-10-31 NOTE — ED Notes (Signed)
Pt ambulated to bathroom. Pt tearful at this time. Pt reports "what kind of game are yall playing, I want to go." pt reports "why am I in handcuffs." discussed care plan with patient and informed pt that if behavior remained cooperative, per officer handcuffs could come off. Pt intermittently following instructions with NT and Insurance underwriter[place] officer. Pt back in room and remains tearful. RUE handcuff was removed. Pt currently has right ankle hand cuff placed. Hospital doctoritter and [place] officer at bedside.   Pt reports "I am seeing things from every where." Offered comfort measures, pt refused.

## 2016-10-31 NOTE — ED Notes (Addendum)
Pt calm until staff enter room. Pt tearful and following commands but generalized BUE trembling and outbursts noted while vital signs being obtained. Unable to obtain oral temperature at this time.

## 2016-10-31 NOTE — ED Notes (Signed)
Consulted [place] officer regarding hand cuffs on pt right ankle. [place] officer discussing plan with patient. Hand cuff removed from pt right ankle as long as patient cooperates.

## 2016-10-31 NOTE — ED Notes (Signed)
Pt has calmed down. [place] PD has took handcuffs of the right wrist. I was able to obtain her vitals.

## 2016-10-31 NOTE — ED Notes (Signed)
[facility] called and informed of pt's updated vital signs and IVC paperwork. [facility] out of handcuffs x24 hours.

## 2016-10-31 NOTE — ED Notes (Signed)
Pt escorted to bathroom by Hospital doctorsitter and [place] Officer. Pt had another tearful episode with incomprehensible speech. nad noted.

## 2016-10-31 NOTE — ED Notes (Signed)
Pt alert, tearful and BUE tremors. Unable to obtain vital signs at this time. Pt shakes head "yes" when asked are you seeing things. Pt speech uncomprehensible, tearful affect lasted approximately 10 minutes. Pt currently resting in room with meal tray at bedside, looking at sitter.

## 2016-10-31 NOTE — ED Notes (Addendum)
Consulted EDP, Dr. Adriana Simasook stated could give pt PO dose of ativan at this time but no orders given for ativan taper. Will attempt to obtain vital signs post ativan administration. Pt alert and calm at this time.

## 2016-11-01 MED ORDER — HALOPERIDOL LACTATE 5 MG/ML IJ SOLN: 5.0000 mg | Freq: Once | INTRAMUSCULAR | Status: DC

## 2016-11-01 MED ORDER — HALOPERIDOL LACTATE 5 MG/ML IJ SOLN
5.0000 mg | Freq: Once | INTRAMUSCULAR | Status: AC
  Administered 2016-11-01: 5 mg via INTRAMUSCULAR

## 2016-11-01 MED ORDER — HALOPERIDOL LACTATE 5 MG/ML IJ SOLN
INTRAMUSCULAR | Status: AC
  Administered 2016-11-01: 5 mg via INTRAMUSCULAR
  Filled 2016-11-01: qty 1

## 2016-11-01 MED ORDER — HALOPERIDOL 2 MG PO TABS
2.0000 mg | ORAL_TABLET | Freq: Two times a day (BID) | ORAL | Status: DC
  Administered 2016-11-01 (×2): 2 mg via ORAL
  Filled 2016-11-01 (×3): qty 1

## 2016-11-01 MED ORDER — ACETAMINOPHEN 325 MG PO TABS
ORAL_TABLET | ORAL | Status: AC
  Filled 2016-11-01: qty 2

## 2016-11-01 MED ORDER — IBUPROFEN 800 MG PO TABS
ORAL_TABLET | ORAL | Status: AC
  Filled 2016-11-01: qty 1

## 2016-11-01 MED ORDER — ACETAMINOPHEN 325 MG PO TABS
650.0000 mg | ORAL_TABLET | Freq: Once | ORAL | Status: AC
  Administered 2016-11-01: 650 mg via ORAL

## 2016-11-01 MED ORDER — HALOPERIDOL 5 MG PO TABS: 5.0000 mg | ORAL_TABLET | Freq: Once | ORAL | Status: DC

## 2016-11-01 MED ORDER — LORAZEPAM 1 MG PO TABS
1.0000 mg | ORAL_TABLET | Freq: Three times a day (TID) | ORAL | Status: DC | PRN
  Administered 2016-11-01: 1 mg via ORAL
  Filled 2016-11-01: qty 1

## 2016-11-01 NOTE — ED Notes (Signed)
Pt did well while visitor was at bedside. Now patient is gripping bed rails and saying "help me." She points to her groin and states, "I can smell it." Pt has had shower today and has no signs of soiled scrubs. While talking with nurse pt keeps her teeth gripped together. As nurse types this pt keeps looking at the ceiling. When asked what she is looking at she says, "the shit on the ceiling."

## 2016-11-01 NOTE — ED Notes (Signed)
Report given to Maralyn SagoSarah with [facility]

## 2016-11-01 NOTE — ED Notes (Addendum)
This nurse was called in the room by Archie Pattenonya, NT and [place] PD. Pt crying hysterically and uncontrollably. Pt pulling at tongue ring and stating "Fuck you dad." Pt is very anxious looking around the room but denies VH. Pt does admit to "hearing things inside my head" but was unable to tell this nurse what the voices were telling her. Pt also C/O of headache.

## 2016-11-01 NOTE — ED Notes (Signed)
When giving meds, pt became very tearful and paranoid. Pt was looking around the room before taking medications from this nurse. Pt never made verbal comments or eye contact with staff.

## 2016-11-01 NOTE — Progress Notes (Signed)
[facility].  Accepting is Leighton Ruffina Okonkwo, NP, attending is Dr. Elna BreslowEappen.  Call report to [phone].  Pt may be transported to arrive at 9 PM.  Carney BernJean T. Kaylyn LimSutter, MSW, LCSWA Clinical Social Work Disposition [phone]

## 2016-11-01 NOTE — ED Notes (Signed)
Pt has visitor at bedside.

## 2016-11-01 NOTE — ED Notes (Signed)
Pt now standing at glass doors staring at the chair. She needed multiple redirections to go back to her bed.

## 2016-11-01 NOTE — ED Notes (Signed)
Pt given meal tray.

## 2016-11-01 NOTE — ED Notes (Signed)
Pt walked to shower with sitter, security, and [place] Officer.

## 2016-11-01 NOTE — Progress Notes (Signed)
CSW spoke with Leotis ShamesLauren, [facility] at [facility] regarding patient's updated [facility]C paperwork. CSW provided fax number and RN confirmed that she would send updated IVC.   Baldo DaubJolan Aubre Quincy MSW, LCSWA CSW Disposition [phone]

## 2016-11-01 NOTE — ED Notes (Signed)
Pt ambulated to the bathroom. No assist needed. Pt cooperative at this time.

## 2016-11-01 NOTE — ED Notes (Signed)
[name] police has been signed off. Pt is calm and cooperative at this time. Pt has a visitor at 1700 and is aware that they will be asked to leave if she becomes agitated.   Pt given meal tray.

## 2016-11-01 NOTE — BH Assessment (Signed)
[facility] Assessment Progress Note  Pt reassessed this morning. Pt presented better than she did yesterday. She gave her name and date of birth. When asked about AVH, pt became fearful AEB the emergence of a shaky, whiny, and anxious voice. Clinician asked pt to describe the voices she was hearing. Pt replied with primarily unintelligible speech. Clinician was only able to make out "TV and me" and pt gestured toward the TV in the room. Clinician asked about SI and pt denied but said that she feels that something in her is going to kill her. Pt denied HI. Pt is still recommended for IP.  Pt's nurse, Lauren, verified that pt has been out of restraints since 1600 yesterday.   Johny ShockSamantha M. Ladona Ridgelaylor, MS, NCC, LPCA Counselor

## 2016-11-01 NOTE — ED Notes (Signed)
Pt now clinging to side rail of bed and grinding her teeth. She tells this nurse she "wants the powdery meat." When giving the patient ativan PO she stated she couldn't open her mouth. Nurse got patient to open her mouth far enough for pills. Pt has not had any of her dinner.

## 2016-11-01 NOTE — ED Notes (Signed)
Pt sitting at bedside staring at staff. She ambulated to the bathroom with no difficulties.

## 2016-11-01 NOTE — ED Notes (Signed)
Pt sleeping at this time.

## 2016-11-01 NOTE — ED Notes (Signed)
[facility]

## 2016-11-01 NOTE — ED Notes (Signed)
EDP Pickering states it is safe to given this dose of Halidol.

## 2016-11-01 NOTE — ED Notes (Signed)
[facility]   Accepting Doctor: Beryle Lathekonkwo Attending Doctor: Elna BreslowEappen  Report to [phone]

## 2016-11-02 ENCOUNTER — Encounter (HOSPITAL_COMMUNITY): Payer: Self-pay

## 2016-11-02 ENCOUNTER — Inpatient Hospital Stay (HOSPITAL_COMMUNITY)
Admission: AD | Admit: 2016-11-02 | Discharge: 2016-11-08 | DRG: 885 | Disposition: A | Discharge status: Home / Self Care | Payer: No Typology Code available for payment source | Attending: Psychiatry | Admitting: Psychiatry

## 2016-11-02 DIAGNOSIS — G4700 Insomnia, unspecified: Secondary | ICD-10-CM | POA: Diagnosis present

## 2016-11-02 DIAGNOSIS — F410 Panic disorder [episodic paroxysmal anxiety] without agoraphobia: Secondary | ICD-10-CM | POA: Diagnosis present

## 2016-11-02 DIAGNOSIS — Z881 Allergy status to other antibiotic agents status: Secondary | ICD-10-CM

## 2016-11-02 DIAGNOSIS — F515 Nightmare disorder: Secondary | ICD-10-CM

## 2016-11-02 DIAGNOSIS — F1193 Opioid use, unspecified with withdrawal: Secondary | ICD-10-CM

## 2016-11-02 DIAGNOSIS — Z8711 Personal history of peptic ulcer disease: Secondary | ICD-10-CM

## 2016-11-02 DIAGNOSIS — Z818 Family history of other mental and behavioral disorders: Secondary | ICD-10-CM

## 2016-11-02 DIAGNOSIS — F3164 Bipolar disorder, current episode mixed, severe, with psychotic features: Secondary | ICD-10-CM | POA: Diagnosis not present

## 2016-11-02 DIAGNOSIS — F1420 Cocaine dependence, uncomplicated: Secondary | ICD-10-CM | POA: Diagnosis present

## 2016-11-02 DIAGNOSIS — Z811 Family history of alcohol abuse and dependence: Secondary | ICD-10-CM | POA: Diagnosis not present

## 2016-11-02 DIAGNOSIS — R45851 Suicidal ideations: Secondary | ICD-10-CM

## 2016-11-02 DIAGNOSIS — F1320 Sedative, hypnotic or anxiolytic dependence, uncomplicated: Secondary | ICD-10-CM | POA: Diagnosis present

## 2016-11-02 DIAGNOSIS — G43909 Migraine, unspecified, not intractable, without status migrainosus: Secondary | ICD-10-CM | POA: Diagnosis present

## 2016-11-02 DIAGNOSIS — B1920 Unspecified viral hepatitis C without hepatic coma: Secondary | ICD-10-CM | POA: Diagnosis present

## 2016-11-02 DIAGNOSIS — F3163 Bipolar disorder, current episode mixed, severe, without psychotic features: Secondary | ICD-10-CM | POA: Diagnosis present

## 2016-11-02 DIAGNOSIS — Z79899 Other long term (current) drug therapy: Secondary | ICD-10-CM | POA: Diagnosis not present

## 2016-11-02 DIAGNOSIS — F411 Generalized anxiety disorder: Secondary | ICD-10-CM | POA: Diagnosis not present

## 2016-11-02 DIAGNOSIS — Z813 Family history of other psychoactive substance abuse and dependence: Secondary | ICD-10-CM

## 2016-11-02 DIAGNOSIS — Z9114 Patient's other noncompliance with medication regimen: Secondary | ICD-10-CM | POA: Diagnosis not present

## 2016-11-02 DIAGNOSIS — F4310 Post-traumatic stress disorder, unspecified: Secondary | ICD-10-CM | POA: Diagnosis not present

## 2016-11-02 DIAGNOSIS — Z888 Allergy status to other drugs, medicaments and biological substances status: Secondary | ICD-10-CM | POA: Diagnosis not present

## 2016-11-02 DIAGNOSIS — F1220 Cannabis dependence, uncomplicated: Secondary | ICD-10-CM | POA: Diagnosis present

## 2016-11-02 DIAGNOSIS — F429 Obsessive-compulsive disorder, unspecified: Secondary | ICD-10-CM | POA: Diagnosis present

## 2016-11-02 DIAGNOSIS — F17210 Nicotine dependence, cigarettes, uncomplicated: Secondary | ICD-10-CM | POA: Diagnosis present

## 2016-11-02 DIAGNOSIS — F13939 Sedative, hypnotic or anxiolytic use, unspecified with withdrawal, unspecified: Secondary | ICD-10-CM

## 2016-11-02 DIAGNOSIS — F1910 Other psychoactive substance abuse, uncomplicated: Secondary | ICD-10-CM

## 2016-11-02 MED ORDER — LORAZEPAM 1 MG PO TABS
1.0000 mg | ORAL_TABLET | Freq: Four times a day (QID) | ORAL | Status: AC | PRN
Start: 2016-11-02 — End: 2016-11-05
  Administered 2016-11-04 – 2016-11-05 (×2): 1 mg via ORAL
  Filled 2016-11-02 (×3): qty 1

## 2016-11-02 MED ORDER — HYDROXYZINE HCL 50 MG PO TABS
50.0000 mg | ORAL_TABLET | Freq: Four times a day (QID) | ORAL | Status: AC | PRN
  Administered 2016-11-03 – 2016-11-05 (×4): 50 mg via ORAL
  Filled 2016-11-02 (×5): qty 1

## 2016-11-02 MED ORDER — HYDROXYZINE HCL 25 MG PO TABS
25.0000 mg | ORAL_TABLET | Freq: Three times a day (TID) | ORAL | Status: DC | PRN
  Administered 2016-11-02: 25 mg via ORAL
  Filled 2016-11-02: qty 1

## 2016-11-02 MED ORDER — LORAZEPAM 1 MG PO TABS
ORAL_TABLET | ORAL | Status: AC
  Administered 2016-11-02: 1 mg via ORAL
  Filled 2016-11-02: qty 1

## 2016-11-02 MED ORDER — HALOPERIDOL LACTATE 5 MG/ML IJ SOLN
5.0000 mg | Freq: Two times a day (BID) | INTRAMUSCULAR | Status: DC | PRN
  Filled 2016-11-02: qty 1

## 2016-11-02 MED ORDER — NICOTINE POLACRILEX 2 MG MT GUM
2.0000 mg | CHEWING_GUM | OROMUCOSAL | Status: DC | PRN
  Administered 2016-11-02 – 2016-11-08 (×34): 2 mg via ORAL
  Filled 2016-11-02 (×23): qty 1

## 2016-11-02 MED ORDER — BENZTROPINE MESYLATE 0.5 MG PO TABS
0.5000 mg | ORAL_TABLET | Freq: Two times a day (BID) | ORAL | Status: DC | PRN
  Administered 2016-11-03 – 2016-11-07 (×5): 0.5 mg via ORAL
  Filled 2016-11-02 (×3): qty 1

## 2016-11-02 MED ORDER — CLONIDINE HCL 0.1 MG PO TABS
0.1000 mg | ORAL_TABLET | ORAL | Status: AC
  Administered 2016-11-02: 0.1 mg via ORAL

## 2016-11-02 MED ORDER — THIAMINE HCL 100 MG/ML IJ SOLN: 100.0000 mg | Freq: Once | INTRAMUSCULAR | Status: DC

## 2016-11-02 MED ORDER — LORAZEPAM 1 MG PO TABS: 1.0000 mg | ORAL_TABLET | Freq: Every day | ORAL | Status: DC

## 2016-11-02 MED ORDER — CLONIDINE HCL 0.1 MG PO TABS
ORAL_TABLET | ORAL | Status: AC
  Administered 2016-11-02: 0.1 mg via ORAL
  Filled 2016-11-02: qty 1

## 2016-11-02 MED ORDER — QUETIAPINE FUMARATE 400 MG PO TABS
400.0000 mg | ORAL_TABLET | Freq: Every day | ORAL | Status: DC
  Filled 2016-11-02: qty 1

## 2016-11-02 MED ORDER — LOPERAMIDE HCL 2 MG PO CAPS
2.0000 mg | ORAL_CAPSULE | ORAL | Status: AC | PRN
  Administered 2016-11-03: 2 mg via ORAL
  Administered 2016-11-03: 4 mg via ORAL
  Filled 2016-11-02: qty 1
  Filled 2016-11-02: qty 2

## 2016-11-02 MED ORDER — LORAZEPAM 1 MG PO TABS
1.0000 mg | ORAL_TABLET | ORAL | Status: AC
  Administered 2016-11-02: 1 mg via ORAL

## 2016-11-02 MED ORDER — LEVOTHYROXINE SODIUM 50 MCG PO TABS
50.0000 ug | ORAL_TABLET | Freq: Every day | ORAL | Status: DC
  Administered 2016-11-02 – 2016-11-08 (×7): 50 ug via ORAL
  Filled 2016-11-02 (×7): qty 1
  Filled 2016-11-02: qty 2
  Filled 2016-11-02 (×2): qty 1

## 2016-11-02 MED ORDER — CARBAMAZEPINE 200 MG PO TABS
200.0000 mg | ORAL_TABLET | Freq: Three times a day (TID) | ORAL | Status: DC
  Administered 2016-11-02: 200 mg via ORAL
  Filled 2016-11-02 (×5): qty 1

## 2016-11-02 MED ORDER — HYDROXYZINE HCL 25 MG PO TABS: 25.0000 mg | ORAL_TABLET | Freq: Four times a day (QID) | ORAL | Status: DC | PRN

## 2016-11-02 MED ORDER — LORAZEPAM 1 MG PO TABS
1.0000 mg | ORAL_TABLET | Freq: Four times a day (QID) | ORAL | Status: AC
  Administered 2016-11-02 – 2016-11-03 (×4): 1 mg via ORAL
  Filled 2016-11-02 (×4): qty 1

## 2016-11-02 MED ORDER — GABAPENTIN 300 MG PO CAPS
600.0000 mg | ORAL_CAPSULE | Freq: Three times a day (TID) | ORAL | Status: DC
  Administered 2016-11-02 – 2016-11-08 (×20): 600 mg via ORAL
  Filled 2016-11-02: qty 2
  Filled 2016-11-02: qty 42
  Filled 2016-11-02 (×7): qty 2
  Filled 2016-11-02: qty 42
  Filled 2016-11-02 (×5): qty 2
  Filled 2016-11-02: qty 42
  Filled 2016-11-02 (×12): qty 2

## 2016-11-02 MED ORDER — ACETAMINOPHEN 325 MG PO TABS
650.0000 mg | ORAL_TABLET | Freq: Four times a day (QID) | ORAL | Status: DC | PRN
Start: 2016-11-02 — End: 2016-11-08
  Administered 2016-11-05 – 2016-11-08 (×9): 650 mg via ORAL
  Filled 2016-11-02 (×9): qty 2

## 2016-11-02 MED ORDER — CLONIDINE HCL 0.1 MG PO TABS
0.1000 mg | ORAL_TABLET | Freq: Four times a day (QID) | ORAL | Status: AC
Start: 2016-11-02 — End: 2016-11-04
  Administered 2016-11-02 – 2016-11-04 (×8): 0.1 mg via ORAL
  Filled 2016-11-02 (×10): qty 1

## 2016-11-02 MED ORDER — LOPERAMIDE HCL 2 MG PO CAPS: 2.0000 mg | ORAL_CAPSULE | ORAL | Status: DC | PRN

## 2016-11-02 MED ORDER — ONDANSETRON 4 MG PO TBDP: 4.0000 mg | ORAL_TABLET | Freq: Four times a day (QID) | ORAL | Status: AC | PRN

## 2016-11-02 MED ORDER — ADULT MULTIVITAMIN W/MINERALS CH
1.0000 | ORAL_TABLET | Freq: Every day | ORAL | Status: DC
  Administered 2016-11-03 – 2016-11-08 (×6): 1 via ORAL
  Filled 2016-11-02 (×9): qty 1

## 2016-11-02 MED ORDER — VITAMIN B-1 100 MG PO TABS
100.0000 mg | ORAL_TABLET | Freq: Every day | ORAL | Status: DC
  Administered 2016-11-03 – 2016-11-08 (×6): 100 mg via ORAL
  Filled 2016-11-02 (×8): qty 1

## 2016-11-02 MED ORDER — HALOPERIDOL 2 MG PO TABS
2.5000 mg | ORAL_TABLET | Freq: Three times a day (TID) | ORAL | Status: DC
  Administered 2016-11-02 – 2016-11-04 (×6): 2.5 mg via ORAL
  Filled 2016-11-02 (×10): qty 1

## 2016-11-02 MED ORDER — ALUM & MAG HYDROXIDE-SIMETH 200-200-20 MG/5ML PO SUSP
30.0000 mL | ORAL | Status: DC | PRN
  Administered 2016-11-02 – 2016-11-07 (×7): 30 mL via ORAL
  Filled 2016-11-02 (×7): qty 30

## 2016-11-02 MED ORDER — ONDANSETRON 4 MG PO TBDP: 4.0000 mg | ORAL_TABLET | Freq: Four times a day (QID) | ORAL | Status: DC | PRN

## 2016-11-02 MED ORDER — BENZTROPINE MESYLATE 1 MG/ML IJ SOLN
0.5000 mg | Freq: Two times a day (BID) | INTRAMUSCULAR | Status: DC | PRN
  Filled 2016-11-02: qty 2

## 2016-11-02 MED ORDER — MAGNESIUM HYDROXIDE 400 MG/5ML PO SUSP: 30.0000 mL | Freq: Every day | ORAL | Status: DC | PRN

## 2016-11-02 MED ORDER — LORAZEPAM 1 MG PO TABS
1.0000 mg | ORAL_TABLET | Freq: Three times a day (TID) | ORAL | Status: AC
  Administered 2016-11-03 – 2016-11-04 (×3): 1 mg via ORAL
  Filled 2016-11-02 (×3): qty 1

## 2016-11-02 MED ORDER — CLONIDINE HCL 0.1 MG PO TABS
0.1000 mg | ORAL_TABLET | Freq: Every day | ORAL | Status: AC
  Administered 2016-11-07 – 2016-11-08 (×2): 0.1 mg via ORAL
  Filled 2016-11-02 (×3): qty 1

## 2016-11-02 MED ORDER — LORAZEPAM 1 MG PO TABS: 1.0000 mg | ORAL_TABLET | Freq: Two times a day (BID) | ORAL | Status: DC

## 2016-11-02 MED ORDER — CLONIDINE HCL 0.1 MG PO TABS
0.1000 mg | ORAL_TABLET | ORAL | Status: AC
  Administered 2016-11-04 – 2016-11-06 (×4): 0.1 mg via ORAL
  Filled 2016-11-02 (×4): qty 1

## 2016-11-02 MED ORDER — BENZTROPINE MESYLATE 0.5 MG PO TABS
0.5000 mg | ORAL_TABLET | Freq: Two times a day (BID) | ORAL | Status: DC
  Administered 2016-11-02 – 2016-11-08 (×12): 0.5 mg via ORAL
  Filled 2016-11-02 (×16): qty 1

## 2016-11-02 MED ORDER — SERTRALINE HCL 50 MG PO TABS
50.0000 mg | ORAL_TABLET | Freq: Every day | ORAL | Status: DC
  Administered 2016-11-02: 50 mg via ORAL
  Filled 2016-11-02 (×4): qty 1

## 2016-11-02 MED ORDER — METHOCARBAMOL 500 MG PO TABS
500.0000 mg | ORAL_TABLET | Freq: Three times a day (TID) | ORAL | Status: AC | PRN
  Administered 2016-11-02 – 2016-11-07 (×11): 500 mg via ORAL
  Filled 2016-11-02 (×13): qty 1

## 2016-11-02 MED ORDER — MIRTAZAPINE 30 MG PO TABS
45.0000 mg | ORAL_TABLET | Freq: Every day | ORAL | Status: DC
  Administered 2016-11-02 – 2016-11-07 (×6): 45 mg via ORAL
  Filled 2016-11-02 (×5): qty 1
  Filled 2016-11-02: qty 11
  Filled 2016-11-02 (×2): qty 1

## 2016-11-02 MED ORDER — HALOPERIDOL 5 MG PO TABS
5.0000 mg | ORAL_TABLET | Freq: Two times a day (BID) | ORAL | Status: DC | PRN
  Administered 2016-11-02 – 2016-11-04 (×4): 5 mg via ORAL
  Filled 2016-11-02 (×4): qty 1

## 2016-11-02 MED ORDER — DICYCLOMINE HCL 20 MG PO TABS
20.0000 mg | ORAL_TABLET | Freq: Four times a day (QID) | ORAL | Status: AC | PRN
  Administered 2016-11-04 – 2016-11-07 (×5): 20 mg via ORAL
  Filled 2016-11-02 (×7): qty 1

## 2016-11-02 MED ORDER — PRAZOSIN HCL 2 MG PO CAPS
2.0000 mg | ORAL_CAPSULE | Freq: Every day | ORAL | Status: DC
  Administered 2016-11-02 – 2016-11-07 (×6): 2 mg via ORAL
  Filled 2016-11-02 (×6): qty 1
  Filled 2016-11-02: qty 2
  Filled 2016-11-02: qty 1
  Filled 2016-11-02: qty 2
  Filled 2016-11-02: qty 7

## 2016-11-02 NOTE — Progress Notes (Signed)
Admission Note  D) Patient admitted to 500 hall. Patient is a 26 year old family who has been IVC'd by her family and was in no acute distress. Patient presents with flat affect and labile mood. Patient was cooperative during the admission process stating "I have been here three times". Patient states "I want to be on the 300 hall, I think substance abuse is my biggest problem". Patient does report auditory and visual hallucinations vague in nature but of "people and someone laughing at me". Patient minimizes her psychosis and has poor insight into her labile mood/reports of delusions. Patient does endorse substance abuse stating "I use benzo's, opiates and pot every day". Patient denies SI/HI or pain. While here, patient reports wanting to work on "learning about my disease, learning coping skills and getting long term treatment". Patient identified her mom as a positive support system.   A) Skin assessment was completed and unremarkable except for multiple tattoos throughout her body. Patient belongings searched with no contraband found. No belongings locked up at admission. Plan of care, unit policies and patient expectations were explained. Patient receptive to information given with no questions. Patient verbalized understanding and contracted for safety on the unit. Consents obtained. Vital signs obtained and WNL. Snacks and fluids provided. Patient oriented to the unit. Patient on standard q15 safety checks. Moderate fall risk precautions initiated and reviewed with patient; patient verbalized understanding. Report given to recieving RN.   R) Patient is in no acute distress. Patient remains safe on the unit at this time. Patient without questions or concerns at this time. Will continue to monitor.
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
## 2016-11-02 NOTE — Progress Notes (Signed)
Recreation Therapy Notes  Date: 11/02/16 Time: 1000 Location: 500 Hall Dayroom  Group Topic: Self-Esteem  Goal Area(s) Addresses:  Patient will identify positive ways to increase self-esteem. Patient will verbalize benefit of increased self-esteem.  Intervention: Markers, construction paper, scissors  Activity: Brochure About Me.  Patients were to create a brochure that highlighted the positive qualities about themselves.  Patients were to identify things that make them unique or that make them proud.  Education:  Self-Esteem, Building control surveyorDischarge Planning.   Education Outcome: Acknowledges education/In group clarification offered/Needs additional education  Clinical Observations/Feedback: Pt did not attend group.   Caroll RancherMarjette Kayl Stogdill, LRT/CTRS         Caroll RancherLindsay, Benito Lemmerman A 11/02/2016 12:23 PM

## 2016-11-02 NOTE — [facility] Group Notes (Signed)
Patient rates her day a 6 saying that is was an ok day. Patient says that she is working on finding the coping skills that she needs to help her work thru her days.

## 2016-11-02 NOTE — Progress Notes (Signed)
Patient denies SI, HI, and AVH this shift, but reports an increase in anxiety.  Patient has noted to be extremely tremulous and unable to engage on the unit.  Patient required multiple prns.   Assess patient for safety offer medications as prescribed, engage patient in 1:1 staff talks.   Patient able to contract for safety.  

## 2016-11-02 NOTE — Tx Team (Signed)
Initial Treatment Plan 11/02/2016 1:29 AM Becky Pancoastanielle Blades ZOX:[id number]    PATIENT STRESSORS: Marital or family conflict Medication change or noncompliance Substance abuse   PATIENT STRENGTHS: Capable of independent living Supportive family/friends   PATIENT IDENTIFIED PROBLEMS: "learn about my disease"  "learn coping skills for substance abuse"  "get long term treatment for substance abuse"  Substance Abuse  Psychosis             DISCHARGE CRITERIA:  Ability to meet basic life and health needs Adequate post-discharge living arrangements Improved stabilization in mood, thinking, and/or behavior Medical problems require only outpatient monitoring Motivation to continue treatment in a less acute level of care Need for constant or close observation no longer present Reduction of life-threatening or endangering symptoms to within safe limits Safe-care adequate arrangements made Verbal commitment to aftercare and medication compliance Withdrawal symptoms are absent or subacute and managed without 24-hour nursing intervention  PRELIMINARY DISCHARGE PLAN: Outpatient therapy  PATIENT/FAMILY INVOLVEMENT: This treatment plan has been presented to and reviewed with the patient, Becky [name].  The patient and family have been given the opportunity to ask questions and make suggestions.  Ferrel LoganAmanda A Deckard Stuber, RN 11/02/2016, 1:29 AM

## 2016-11-02 NOTE — [facility] Group Notes (Signed)
[facility] Group Notes:  (Counselor/Nursing/MHT/Case Management/Adjunct)  11/02/2016 1:15PM  Type of Therapy:  Group Therapy  Participation Level:  Active  Participation Quality:  Appropriate  Affect:  Flat  Cognitive:  Oriented  Insight:  Improving  Engagement in Group:  Limited  Engagement in Therapy:  Limited  Modes of Intervention:  Discussion, Exploration and Socialization  Summary of Progress/Problems: The topic for group was balance in life.  Pt participated in the discussion about when their life was in balance and out of balance and how this feels.  Pt discussed ways to get back in balance and short term goals they can work on to get where they want to be. In and out multiple times, often distracted when present.  "I'm balanced today because I am not making those jerking movements with my hands that I was earlier, and I'm not stuttering.  That's what happens when I am really anxious."  Unable to identify anything that helps her find balance, other than leaving the situation.  "I guess that's part of the problem.  I don't have good coping skills."  Unable to identify anyone else had said that she could try to employ.   Becky [name], Becky [name] 11/02/2016 4:27 PM

## 2016-11-02 NOTE — [facility] Suicide Risk Assessment (Signed)
[facility]   Nursing information obtained from:    Demographic factors:    Current Mental Status:    Loss Factors:    Historical Factors:    Risk Reduction Factors:     Total Time spent with patient: 20 minutes Principal Problem: Bipolar disorder, curr episode mixed, severe, with psychotic features (HCC) Diagnosis:   Patient Active Problem List   Diagnosis Date Noted  . Bipolar disorder, curr episode mixed, severe, with psychotic features (HCC) [F31.64] 11/02/2016  . PTSD (post-traumatic stress disorder) [F43.10] 11/02/2016  . GAD (generalized anxiety disorder) [F41.1] 10/16/2016  . Opioid use disorder, moderate, dependence (HCC) [F11.20] 06/06/2016  . Sedative, hypnotic or anxiolytic use disorder, severe, dependence (HCC) [F13.20] 06/06/2016  . Cocaine use disorder, severe, dependence (HCC) [F14.20] 06/06/2016  . Cannabis use disorder, severe, dependence (HCC) [F12.20] 06/06/2016  . MDD (major depressive disorder), recurrent severe, without psychosis (HCC) [F33.2] 06/03/2016  . Bipolar I disorder, most recent episode depressed (HCC) [F31.30] 07/04/2015  . Overdose [T50.901A] 07/02/2015  . Lactic acidosis [E87.2] 07/02/2015  . Acute respiratory failure (HCC) [J96.00] 07/02/2015  . Sepsis (HCC) [A41.9] 07/02/2015  . AKI (acute kidney injury) (HCC) [N17.9]   . Altered mental status [R41.82]   . Pyrexia [R50.9]   . Abdominal pain, epigastric [R10.13] 06/17/2015  . Hepatitis C antibody test positive [R76.8] 06/17/2015  . Peptic ulcer disease [K27.9] 06/17/2015  . Epigastric pain [R10.13]   . Transaminitis [R74.0] 06/16/2015  . History of substance abuse [Z87.898] 06/16/2015   Subjective Data: Please see H&P.   Continued Clinical Symptoms:  Alcohol Use Disorder Identification Test Final Score (AUDIT): 0 The "Alcohol Use Disorders Identification Test", Guidelines for Use in Primary Care, Second Edition.  World Science writerHealth Organization [facility](WHO). Score between  0-7:  no or low risk or alcohol related problems. Score between 8-15:  moderate risk of alcohol related problems. Score between 16-19:  high risk of alcohol related problems. Score 20 or above:  warrants further diagnostic evaluation for alcohol dependence and treatment.   CLINICAL FACTORS:   Severe Anxiety and/or Agitation Bipolar Disorder:   Mixed State Alcohol/Substance Abuse/Dependencies Currently Psychotic Unstable or Poor Therapeutic Relationship   Musculoskeletal: Strength & Muscle Tone: within normal limits Gait & Station: normal Patient leans: N/A  Psychiatric Specialty Exam: Physical Exam  ROS  Blood pressure 116/73, pulse (!) 104, temperature 98 F (36.7 C), temperature source Oral, resp. rate 16, height 5\' 3"  (1.6 m), weight 69.4 kg (153 lb), SpO2 99 %.Body mass index is 27.1 kg/m.  Please see H&P.                                                         COGNITIVE FEATURES THAT CONTRIBUTE TO RISK:  Closed-mindedness, Polarized thinking and Thought constriction (tunnel vision)    SUICIDE RISK:   Moderate:  Frequent suicidal ideation with limited intensity, and duration, some specificity in terms of plans, no associated intent, good self-control, limited dysphoria/symptomatology, some risk factors present, and identifiable protective factors, including available and accessible social support.  PLAN OF CARE: Please see H&P.   I certify that inpatient services furnished can reasonably be expected to improve the patient's condition.   Helene Bernstein, MD 11/02/2016, 2:34 PM

## 2016-11-02 NOTE — Plan of Care (Signed)
Problem: Anxiety Goal: Level of anxiety will decrease Patient will experience a decrease in anxiety  Outcome: Progressing Patient has been able to use medication management to decrease levels of anxiety this shift.  Patient was able to verbalize anxiety and fear to nurse.

## 2016-11-02 NOTE — Progress Notes (Signed)
Recreation Therapy Notes  INPATIENT RECREATION THERAPY ASSESSMENT  Patient Details Name: Becky [name] MRN: [id number] DOB: 02/19/1991 Today's Date: 11/02/2016  Patient Stressors: Friends, Other (Comment) (Home)  Pt stated she doesn't know why she is here. Pt stated her friends were stressor because they are a bad influence.  Coping Skills:   Isolate, Arguments, Substance Abuse, Avoidance, Talking, Music  Personal Challenges: Anger, Communication, Concentration, Decision-Making, Expressing Yourself, Problem-Solving, Relationships, Self-Esteem/Confidence, Social Interaction, Stress Management, Substance Abuse, Time Management, Trusting Others, Work Nutritional therapisterformance  Leisure Interests (2+):  Social - Family, Individual - TV  Awareness of Community Resources:  Yes  Community Resources:  [place], [place]  Current Use: No  If no, Barriers?: Transportation  Patient Strengths:  Good personality  Patient Identified Areas of Improvement:  "Me"  Current Recreation Participation:  Rarely  Patient Goal for Hospitalization:  "Figure out what's going on with me"  [place]  [place]  Current [place] (including self-harm):  No  Current HI:  No  Consent to Intern Participation: N/A   Caroll RancherMarjette Dany Harten, LRT/CTRS  Caroll RancherLindsay, Jaynell Castagnola A 11/02/2016, 2:24 PM

## 2016-11-03 DIAGNOSIS — F1490 Cocaine use, unspecified, uncomplicated: Secondary | ICD-10-CM

## 2016-11-03 DIAGNOSIS — F1390 Sedative, hypnotic, or anxiolytic use, unspecified, uncomplicated: Secondary | ICD-10-CM

## 2016-11-03 DIAGNOSIS — F1290 Cannabis use, unspecified, uncomplicated: Secondary | ICD-10-CM

## 2016-11-03 DIAGNOSIS — F411 Generalized anxiety disorder: Secondary | ICD-10-CM

## 2016-11-03 DIAGNOSIS — F1190 Opioid use, unspecified, uncomplicated: Secondary | ICD-10-CM

## 2016-11-03 NOTE — Progress Notes (Signed)
[facility]  11/03/2016 1:02 PM Becky PancoastDanielle [name]  MRN:  [id number]   Subjective:  Patient stated I have a problem, and Dr. Elna BreslowEappen stopped all my medication and placed on new medications." Patient also endorses using illicit drugs and looking for long-term substance abuse placement upon discharge from the hospital.  Objective: Patient seen, chart reviewed and case discussed with treatment team here Becky OsmondDaniel [name] is a 26 years old female with the bipolar disorder, posttraumatic stress disorder, generalized anxiety disorder and polysubstance abuse admitted secondary to confusion, hallucinations and intoxication with a drug of abuse. Patient was placed on detox treatment, CIWA and COWS protocol. Staff RN reported patient has been drug seeking, demanding and extremely anxious.  Patient to urine drug screen is positive for multiple illicit drugs including tetrahydrocannabinol, cocaine and prescription medication opiates and benzodiazepines as of 10/29/2016.  Principal Problem: Bipolar disorder, curr episode mixed, severe, with psychotic features (HCC) Diagnosis:   Patient Active Problem List   Diagnosis Date Noted  . Bipolar disorder, curr episode mixed, severe, with psychotic features (HCC) [F31.64] 11/02/2016  . PTSD (post-traumatic stress disorder) [F43.10] 11/02/2016  . GAD (generalized anxiety disorder) [F41.1] 10/16/2016  . Opioid use disorder, moderate, dependence (HCC) [F11.20] 06/06/2016  . Sedative, hypnotic or anxiolytic use disorder, severe, dependence (HCC) [F13.20] 06/06/2016  . Cocaine use disorder, severe, dependence (HCC) [F14.20] 06/06/2016  . Cannabis use disorder, severe, dependence (HCC) [F12.20] 06/06/2016  . MDD (major depressive disorder), recurrent severe, without psychosis (HCC) [F33.2] 06/03/2016  . Bipolar I disorder, most recent episode depressed (HCC) [F31.30] 07/04/2015  . Overdose [T50.901A] 07/02/2015  . Lactic acidosis [E87.2] 07/02/2015  . Acute  respiratory failure (HCC) [J96.00] 07/02/2015  . Sepsis (HCC) [A41.9] 07/02/2015  . AKI (acute kidney injury) (HCC) [N17.9]   . Altered mental status [R41.82]   . Pyrexia [R50.9]   . Abdominal pain, epigastric [R10.13] 06/17/2015  . Hepatitis C antibody test positive [R76.8] 06/17/2015  . Peptic ulcer disease [K27.9] 06/17/2015  . Epigastric pain [R10.13]   . Transaminitis [R74.0] 06/16/2015  . History of substance abuse [Z87.898] 06/16/2015   Total Time spent with patient: 30 minutes  Past Psychiatric History: Patient has been diagnosed with Bipolar do, PTSD, And polysubstance abuse. [facility] , [facility] and so on in the past.   Past Medical History:  Past Medical History:  Diagnosis Date  . Bipolar 1 disorder (HCC)   . Bronchitis   . GAD (generalized anxiety disorder)   . Hepatitis C antibody test positive 06/17/2015  . History of substance abuse 06/16/2015  . OCD (obsessive compulsive disorder)   . Peptic ulcer   . Polysubstance abuse   . PTSD (post-traumatic stress disorder)     Past Surgical History:  Procedure Laterality Date  . OTHER SURGICAL HISTORY     scar tissue removed from right ovary   . OTHER SURGICAL HISTORY     fallopian tube repair  . WISDOM TOOTH EXTRACTION     Family History:  Family History  Problem Relation Age of Onset  . Drug abuse Brother   . Suicidality Brother   . Cancer Other   . Depression Mother   . Drug abuse Mother   . Alcoholism Father   . Bipolar disorder Cousin    Family Psychiatric  History: Pt reports her brother hung himself in jail when she was 26 yrs old. Social History:  History  Alcohol Use  . Yes    Comment: occ  History  Drug Use  . Types: Marijuana, Methamphetamines, MDMA (Ecstacy), Oxycodone, Benzodiazepines, Cocaine    Social History   Social History  . Marital status: Legally Separated    Spouse name: N/A  . Number of children: N/A  . Years of education: N/A   Social  History Main Topics  . Smoking status: Current Every Day Smoker    Packs/day: 1.00    Years: 10.00    Types: Cigarettes  . Smokeless tobacco: Never Used  . Alcohol use Yes     Comment: occ  . Drug use: Yes    Types: Marijuana, Methamphetamines, MDMA (Ecstacy), Oxycodone, Benzodiazepines, Cocaine  . Sexual activity: Yes    Birth control/ protection: None   Other Topics Concern  . None   Social History Narrative  . None   Additional Social History:      Sleep: Fair  Appetite:  Fair  Current Medications: Current Facility-Administered Medications  Medication Dose Route Frequency Provider Last Rate Last Dose  . acetaminophen (TYLENOL) tablet 650 mg  650 mg Oral Q6H PRN Okonkwo, Justina A, NP      . alum & mag hydroxide-simeth (MAALOX/MYLANTA) 200-200-20 MG/5ML suspension 30 mL  30 mL Oral Q4H PRN Okonkwo, Justina A, NP   30 mL at 11/02/16 0503  . benztropine (COGENTIN) tablet 0.5 mg  0.5 mg Oral BID Jomarie Longs, MD   0.5 mg at 11/03/16 0816  . benztropine (COGENTIN) tablet 0.5 mg  0.5 mg Oral BID PRN Jomarie Longs, MD   0.5 mg at 11/03/16 0446   Or  . benztropine mesylate (COGENTIN) injection 0.5 mg  0.5 mg Intramuscular BID PRN Eappen, Levin Bacon, MD      . cloNIDine (CATAPRES) tablet 0.1 mg  0.1 mg Oral QID Eappen, Saramma, MD   0.1 mg at 11/03/16 1157   Followed by  . [START ON 11/04/2016] cloNIDine (CATAPRES) tablet 0.1 mg  0.1 mg Oral BH-qamhs Jomarie Longs, MD       Followed by  . [START ON 11/07/2016] cloNIDine (CATAPRES) tablet 0.1 mg  0.1 mg Oral QAC breakfast Eappen, Saramma, MD      . dicyclomine (BENTYL) tablet 20 mg  20 mg Oral Q6H PRN Eappen, Saramma, MD      . gabapentin (NEURONTIN) capsule 600 mg  600 mg Oral TID Beryle Lathe, Justina A, NP   600 mg at 11/03/16 1158  . haloperidol (HALDOL) tablet 2.5 mg  2.5 mg Oral TID Jomarie Longs, MD   2.5 mg at 11/03/16 1158  . haloperidol (HALDOL) tablet 5 mg  5 mg Oral BID PRN Jomarie Longs, MD   5 mg at 11/03/16 0446    Or  . haloperidol lactate (HALDOL) injection 5 mg  5 mg Intramuscular BID PRN Eappen, Levin Bacon, MD      . hydrOXYzine (ATARAX/VISTARIL) tablet 50 mg  50 mg Oral Q6H PRN Eappen, Saramma, MD      . levothyroxine (SYNTHROID, LEVOTHROID) tablet 50 mcg  50 mcg Oral QAC breakfast Okonkwo, Justina A, NP   50 mcg at 11/03/16 0558  . loperamide (IMODIUM) capsule 2-4 mg  2-4 mg Oral PRN Jomarie Longs, MD   4 mg at 11/03/16 1610  . LORazepam (ATIVAN) tablet 1 mg  1 mg Oral Q6H PRN Eappen, Saramma, MD      . LORazepam (ATIVAN) tablet 1 mg  1 mg Oral TID Jomarie Longs, MD   1 mg at 11/03/16 1158   Followed by  . [START ON 11/04/2016] LORazepam (ATIVAN) tablet 1 mg  1 mg Oral BID Jomarie Longs, MD       Followed by  . [START ON 11/06/2016] LORazepam (ATIVAN) tablet 1 mg  1 mg Oral Daily Eappen, Saramma, MD      . magnesium hydroxide (MILK OF MAGNESIA) suspension 30 mL  30 mL Oral Daily PRN Okonkwo, Justina A, NP      . methocarbamol (ROBAXIN) tablet 500 mg  500 mg Oral Q8H PRN Jomarie Longs, MD   500 mg at 11/03/16 1610  . mirtazapine (REMERON) tablet 45 mg  45 mg Oral QHS Okonkwo, Justina A, NP   45 mg at 11/02/16 2102  . multivitamin with minerals tablet 1 tablet  1 tablet Oral Daily Jomarie Longs, MD   1 tablet at 11/03/16 0818  . nicotine polacrilex (NICORETTE) gum 2 mg  2 mg Oral PRN Donell Sievert E, PA-C   2 mg at 11/03/16 9604  . ondansetron (ZOFRAN-ODT) disintegrating tablet 4 mg  4 mg Oral Q6H PRN Eappen, Saramma, MD      . prazosin (MINIPRESS) capsule 2 mg  2 mg Oral QHS Okonkwo, Justina A, NP   2 mg at 11/02/16 2102  . thiamine (B-1) injection 100 mg  100 mg Intramuscular Once Eappen, Saramma, MD      . thiamine (VITAMIN B-1) tablet 100 mg  100 mg Oral Daily Eappen, Saramma, MD   100 mg at 11/03/16 5409    Lab Results: No results found for this or any previous visit (from the past 48 hour(s)).  Blood Alcohol level:  Lab Results  Component Value Date   ETH <5 10/29/2016   ETH <5  10/07/2016    Metabolic Disorder Labs: Lab Results  Component Value Date   HGBA1C 5.3 10/16/2016   MPG 105 10/16/2016   MPG 94 06/07/2016   Lab Results  Component Value Date   PROLACTIN 8.8 10/16/2016   PROLACTIN 49.9 (H) 06/07/2016   Lab Results  Component Value Date   CHOL 207 (H) 10/16/2016   TRIG 140 10/16/2016   HDL 104 10/16/2016   CHOLHDL 2.0 10/16/2016   VLDL 28 10/16/2016   LDLCALC 75 10/16/2016   LDLCALC 76 06/07/2016    Physical Findings: AIMS: Facial and Oral Movements Muscles of Facial Expression: None, normal Lips and Perioral Area: None, normal Jaw: None, normal Tongue: None, normal,Extremity Movements Upper (arms, wrists, hands, fingers): None, normal Lower (legs, knees, ankles, toes): None, normal, Trunk Movements Neck, shoulders, hips: None, normal, Overall Severity Severity of abnormal movements (highest score from questions above): None, normal Incapacitation due to abnormal movements: None, normal Patient's awareness of abnormal movements (rate only patient's report): No Awareness, Dental Status Current problems with teeth and/or dentures?: No Does patient usually wear dentures?: No  CIWA:  CIWA-Ar Total: 17 COWS:  COWS Total Score: 3  Musculoskeletal: Strength & Muscle Tone: within normal limits Gait & Station: normal Patient leans: N/A  Psychiatric Specialty Exam: Physical Exam  ROS Anxiety, hallucinations, confusion, drug-seeking behavior, demanding and frequently visiting nursing station. Patient denied nausea, vomiting, abdomen pain, shortness of breath and chest pain. Patient does not have shaking, Tremors or sweating.   Blood pressure 136/86, pulse (!) 125, temperature 97.9 F (36.6 C), temperature source Oral, resp. rate 16, height 5\' 3"  (1.6 m), weight 69.4 kg (153 lb), SpO2 99 %.Body mass index is 27.1 kg/m.  General Appearance: Guarded  Eye Contact:  Good  Speech:  Clear and Coherent  Volume:  Decreased  Mood:  Anxious and  Depressed  Affect:  Labile  Thought Process:  Coherent and Goal Directed  Orientation:  Full (Time, Place, and Person)  Thought Content:  Rumination  Suicidal Thoughts:  No  Homicidal Thoughts:  No  Memory:  Immediate;   Good Recent;   Fair Remote;   Fair  Judgement:  Impaired  Insight:  Fair  Psychomotor Activity:  Restlessness  Concentration:  Concentration: Fair and Attention Span: Fair  Recall:  Fair  Fund of Knowledge:  Good  Language:  Good  Akathisia:  Negative  Handed:  Right  AIMS (if indicated):     Assets:  Communication Skills Desire for Improvement Housing Leisure Time Physical Health Resilience Social Support Transportation  ADL's:  Intact  Cognition:  WNL  Sleep:  Number of Hours: 4.75   Treatment Plan: 26 years old female with the diagnosis of bipolar disorder, posttraumatic stress disorder, polysubstance abuse presented with the polysubstance intoxication Admitted with increased psychosis, agitation, confusion and possible withdrawal of drug of hip abuse and patient is also noncompliant with her medication management. Patient will benefit from inpatient treatment and stabilization. Estimated length of stay is 5-7 days.   Patient is not stable at this time but compliant with medication and no reported adverse affects and will be monitored for withdrawal symptoms benzodiazepines and opiates.  Daily contact with patient to assess and evaluate symptoms and progress in treatment and Medication management   Treatment plan:  Continue Haldol 2.5 mg po tid for psychosis. Continue Cogentin 0.5 mg po bid for EPS. Continue Neurontin 600 mg po tid for anxiety. Continue Remeron 45 mg po qhs for affective sx. Continue Prazosin 2 mg po qhs for nightmares. Continue clonidine Detox treatment for opiates and Ativan detox treatment for benzodiazepines withdrawals CIWA/Ativan protocol for BZD withdrawal sx. COWS clonidine protocol for Opioid withdrawal sx. Will continue to  monitor vitals ,medication compliance and treatment side effects while patient is here.  Will monitor for medical issues as well as call consult as needed.   Reviewed labs AST/ALT - elevated , UDS- positive for cocaine, thc, bzd, opioids  Pending labs: tsh, lipid panel, hba1c, pl. CSW will start working on disposition. Pt is motivated to go to a substance abuse treatment program. Patient to participate in therapeutic milieu .   Leata Mouse, MD 11/03/2016, 1:02 PM
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
## 2016-11-03 NOTE — Progress Notes (Signed)
Patient denies SI, HI, and AVH this shift, but reports an increase in anxiety.  Patient has noted to be extremely tremulous and unable to engage on the unit.  Patient required multiple prns.   Assess patient for safety offer medications as prescribed, engage patient in 1:1 staff talks.   Patient able to contract for safety.

## 2016-11-03 NOTE — Progress Notes (Addendum)
Patient ID: Becky PancoastDanielle [name], female   DOB: 01/24/1991, 26 y.o.   MRN: [id number]  Pt currently presents with a flat affect and depressed behavior. Pt reports to writer that their goal is to "get back on the right medications." Pt states "I went to live with my boyfriend and started using again." This is the same goal that the patient had last admission. Pt reports good sleep with current medication regimen.   Pt provided with medications per providers orders. Pt's labs and vitals were monitored throughout the night. Pt given a 1:1 about emotional and mental status. Pt supported and encouraged to express concerns and questions. Pt educated on medication and alternative stress relief techniques.   Pt's safety ensured with 15 minute and environmental checks. Reports cravings 7/10. Pt currently endorses passive SI, no plan. Pt denies HI and A/V hallucinations. Pt verbally agrees to seek staff if SI worsens, HI or A/VH occurs and to consult with staff before acting on any harmful thoughts. Will continue POC.

## 2016-11-03 NOTE — [facility] Group Notes (Signed)
[facility]  11/03/2016 , 2:31 PM    Type of Therapy:  [facility] Presentation  Participation Level:  Active  Participation Quality:  Attentive  Affect:  Blunted  Cognitive:  Oriented  Insight:  Limited  Engagement in Therapy:  Engaged  Modes of Intervention:  Discussion, Education and Socialization  Summary of Progress/Problems:  Becky [name] from [facility] came to present his recovery story and play the guitar.  As with yesterday, came to group, stayed briefly, left, returned, left again and did not return.  Becky [name], Becky [name] 11/03/2016 , 2:31 PM
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
## 2016-11-03 NOTE — Progress Notes (Signed)
Recreation Therapy Notes  Date: 11/03/16 Time: 1000 Location: 500 Hall Dayroom  Group Topic: Communication, Team Building, Problem Solving  Goal Area(s) Addresses:  Patient will effectively work with peer towards shared goal.  Patient will identify skills used to make activity successful.  Patient will identify how skills used during activity can be used to reach post d/c goals.   Behavioral Response: Engaged  Intervention: STEM Activity  Activity: Stage managerLanding Pad. In teams patients were given 12 plastic drinking straws and a length of masking tape. Using the materials provided patients were asked to build a landing pad to catch a golf ball dropped from approximately 6 feet in the air.   Education: Pharmacist, communityocial Skills, Discharge Planning   Education Outcome: Acknowledges education/In group clarification offered/Needs additional education.   Clinical Observations/Feedback:  Pt showed some frustration with the activity but worked through it.  Pt worked well with her partner.  Pt stated the activity was hard because they had to figure out how to keep the ball from bouncing out of their landing pad.  Pt explained she learned "not to take tiny things and make them bigger" from the activity.   Caroll RancherMarjette Maiana Hennigan, LRT/CTRS         Caroll RancherLindsay, Eydan Chianese A 11/03/2016 11:48 AM
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
## 2016-11-03 NOTE — Tx Team (Signed)
Interdisciplinary Treatment and Diagnostic Plan Update  11/03/2016 Time of Session: 2:48 PM  Becky [name] MRN: [id number]  Principal Diagnosis: Bipolar disorder, curr episode mixed, severe, with psychotic features ([place])  Secondary Diagnoses: Principal Problem:   Bipolar disorder, curr episode mixed, severe, with psychotic features ([place]) Active Problems:   Sedative, hypnotic or anxiolytic use disorder, severe, dependence ([place])   Cocaine use disorder, severe, dependence ([place])   Cannabis use disorder, severe, dependence ([place])   PTSD (post-traumatic stress disorder)   Current Medications:  Current Facility-Administered Medications  Medication Dose Route Frequency Provider Last Rate Last Dose  . acetaminophen (TYLENOL) tablet 650 mg  650 mg Oral Q6H PRN Okonkwo, Justina A, NP      . alum & mag hydroxide-simeth (MAALOX/MYLANTA) 200-200-20 MG/5ML suspension 30 mL  30 mL Oral Q4H PRN Okonkwo, Justina A, NP   30 mL at 11/02/16 0503  . benztropine (COGENTIN) tablet 0.5 mg  0.5 mg Oral BID Ursula Alert, MD   0.5 mg at 11/03/16 0816  . benztropine (COGENTIN) tablet 0.5 mg  0.5 mg Oral BID PRN Ursula Alert, MD   0.5 mg at 11/03/16 1442   Or  . benztropine mesylate (COGENTIN) injection 0.5 mg  0.5 mg Intramuscular BID PRN Eappen, Ria Clock, MD      . cloNIDine (CATAPRES) tablet 0.1 mg  0.1 mg Oral QID Eappen, Saramma, MD   0.1 mg at 11/03/16 1157   Followed by  . [START ON 11/04/2016] cloNIDine (CATAPRES) tablet 0.1 mg  0.1 mg Oral BH-qamhs Ursula Alert, MD       Followed by  . [START ON 11/07/2016] cloNIDine (CATAPRES) tablet 0.1 mg  0.1 mg Oral QAC breakfast Eappen, Saramma, MD      . dicyclomine (BENTYL) tablet 20 mg  20 mg Oral Q6H PRN Eappen, Saramma, MD      . gabapentin (NEURONTIN) capsule 600 mg  600 mg Oral TID Lu Duffel, Justina A, NP   600 mg at 11/03/16 1158  . haloperidol (HALDOL) tablet 2.5 mg  2.5 mg Oral TID Ursula Alert, MD   2.5 mg at 11/03/16 1158  . haloperidol  (HALDOL) tablet 5 mg  5 mg Oral BID PRN Ursula Alert, MD   5 mg at 11/03/16 1442   Or  . haloperidol lactate (HALDOL) injection 5 mg  5 mg Intramuscular BID PRN Eappen, Ria Clock, MD      . hydrOXYzine (ATARAX/VISTARIL) tablet 50 mg  50 mg Oral Q6H PRN Eappen, Saramma, MD      . levothyroxine (SYNTHROID, LEVOTHROID) tablet 50 mcg  50 mcg Oral QAC breakfast Okonkwo, Justina A, NP   50 mcg at 11/03/16 0558  . loperamide (IMODIUM) capsule 2-4 mg  2-4 mg Oral PRN Ursula Alert, MD   4 mg at 11/03/16 1103  . LORazepam (ATIVAN) tablet 1 mg  1 mg Oral Q6H PRN Eappen, Saramma, MD      . LORazepam (ATIVAN) tablet 1 mg  1 mg Oral TID Ursula Alert, MD   1 mg at 11/03/16 1158   Followed by  . [START ON 11/04/2016] LORazepam (ATIVAN) tablet 1 mg  1 mg Oral BID Ursula Alert, MD       Followed by  . [START ON 11/06/2016] LORazepam (ATIVAN) tablet 1 mg  1 mg Oral Daily Eappen, Saramma, MD      . magnesium hydroxide (MILK OF MAGNESIA) suspension 30 mL  30 mL Oral Daily PRN Okonkwo, Justina A, NP      . methocarbamol (ROBAXIN) tablet 500 mg  500 mg Oral Q8H PRN Ursula Alert, MD   500 mg at 11/03/16 2774  . mirtazapine (REMERON) tablet 45 mg  45 mg Oral QHS Okonkwo, Justina A, NP   45 mg at 11/02/16 2102  . multivitamin with minerals tablet 1 tablet  1 tablet Oral Daily Ursula Alert, MD   1 tablet at 11/03/16 0818  . nicotine polacrilex (NICORETTE) gum 2 mg  2 mg Oral PRN Patriciaann Clan E, PA-C   2 mg at 11/03/16 1444  . ondansetron (ZOFRAN-ODT) disintegrating tablet 4 mg  4 mg Oral Q6H PRN Eappen, Saramma, MD      . prazosin (MINIPRESS) capsule 2 mg  2 mg Oral QHS Okonkwo, Justina A, NP   2 mg at 11/02/16 2102  . thiamine (B-1) injection 100 mg  100 mg Intramuscular Once Eappen, Saramma, MD      . thiamine (VITAMIN B-1) tablet 100 mg  100 mg Oral Daily Eappen, Saramma, MD   100 mg at 11/03/16 1287    PTA Medications: Prescriptions Prior to Admission  Medication Sig Dispense Refill Last Dose  .  carbamazepine (TEGRETOL) 200 MG tablet Take 1 tablet (200 mg total) by mouth 3 (three) times daily. For mood stabilizatyion 90 tablet 0 Past Week at Unknown time  . gabapentin (NEURONTIN) 300 MG capsule Take 2 capsules (600 mg total) by mouth 3 (three) times daily. For agitation 180 capsule 0 Past Week at Unknown time  . hydrOXYzine (ATARAX/VISTARIL) 25 MG tablet Take 1 tablet (25 mg) three times daily as needed: for anxiety (Patient taking differently: Take 25 mg by mouth 3 (three) times daily as needed for anxiety. ) 60 tablet 0 Past Week at Unknown time  . levothyroxine (SYNTHROID, LEVOTHROID) 50 MCG tablet Take 1 tablet (50 mcg total) by mouth daily before breakfast. For low thyroid Hormone 30 tablet 0 Past Week at Unknown time  . mirtazapine (REMERON) 45 MG tablet Take 1 tablet (45 mg total) by mouth at bedtime. For depression/sleep 30 tablet 0 Past Week at Unknown time  . nicotine polacrilex (NICORETTE) 2 MG gum Take 1 each (2 mg total) by mouth as needed for smoking cessation. (Patient taking differently: Take 2 mg by mouth every 4 (four) hours as needed for smoking cessation. ) 100 tablet 0 Past Week at Unknown time  . prazosin (MINIPRESS) 2 MG capsule Take 1 capsule (2 mg total) by mouth at bedtime. For nightmares 30 capsule 0 Past Week at Unknown time  . QUEtiapine (SEROQUEL) 200 MG tablet Take 1 tablet (200 mg total) by mouth 2 (two) times daily. For agitation/mood control 60 tablet 0 Past Week at Unknown time  . QUEtiapine (SEROQUEL) 400 MG tablet Take 1 tablet (400 mg total) by mouth at bedtime. For mood control 30 tablet 0 Past Week at Unknown time  . sertraline (ZOLOFT) 50 MG tablet Take 1 tablet (50 mg total) by mouth daily. For depression 30 tablet 0 Past Week at Unknown time    Treatment Modalities: Medication Management, Group therapy, Case management,  1 to 1 session with clinician, Psychoeducation, Recreational therapy.   Physician Treatment Plan for Primary Diagnosis: Bipolar  disorder, curr episode mixed, severe, with psychotic features ([place]) Long Term Goal(s): Improvement in symptoms so as ready for discharge  Short Term Goals: Ability to identify changes in lifestyle to reduce recurrence of condition will improve Ability to verbalize feelings will improve Compliance with prescribed medications will improve Ability to identify triggers associated with substance abuse/mental health issues will improve Ability  to identify changes in lifestyle to reduce recurrence of condition will improve Ability to verbalize feelings will improve Compliance with prescribed medications will improve Ability to identify triggers associated with substance abuse/mental health issues will improve  Medication Management: Evaluate patient's response, side effects, and tolerance of medication regimen.  Therapeutic Interventions: 1 to 1 sessions, Unit Group sessions and Medication administration.  Evaluation of Outcomes: Progressing  Physician Treatment Plan for Secondary Diagnosis: Principal Problem:   Bipolar disorder, curr episode mixed, severe, with psychotic features ([place]) Active Problems:   Sedative, hypnotic or anxiolytic use disorder, severe, dependence ([place])   Cocaine use disorder, severe, dependence ([place])   Cannabis use disorder, severe, dependence ([place])   PTSD (post-traumatic stress disorder)   Long Term Goal(s): Improvement in symptoms so as ready for discharge  Short Term Goals: Ability to identify changes in lifestyle to reduce recurrence of condition will improve Ability to verbalize feelings will improve Compliance with prescribed medications will improve Ability to identify triggers associated with substance abuse/mental health issues will improve Ability to identify changes in lifestyle to reduce recurrence of condition will improve Ability to verbalize feelings will improve Compliance with prescribed medications will improve Ability to identify triggers  associated with substance abuse/mental health issues will improve  Medication Management: Evaluate patient's response, side effects, and tolerance of medication regimen.  Therapeutic Interventions: 1 to 1 sessions, Unit Group sessions and Medication administration.  Evaluation of Outcomes: Progressing   RN Treatment Plan for Primary Diagnosis: Bipolar disorder, curr episode mixed, severe, with psychotic features ([place]) Long Term Goal(s): Knowledge of disease and therapeutic regimen to maintain health will improve  Short Term Goals: Ability to identify and develop effective coping behaviors will improve and Compliance with prescribed medications will improve  Medication Management: RN will administer medications as ordered by provider, will assess and evaluate patient's response and provide education to patient for prescribed medication. RN will report any adverse and/or side effects to prescribing provider.  Therapeutic Interventions: 1 on 1 counseling sessions, Psychoeducation, Medication administration, Evaluate responses to treatment, Monitor vital signs and CBGs as ordered, Perform/monitor CIWA, COWS, AIMS and Fall Risk screenings as ordered, Perform wound care treatments as ordered.  Evaluation of Outcomes: Progressing   LCSW Treatment Plan for Primary Diagnosis: Bipolar disorder, curr episode mixed, severe, with psychotic features ([place]) Long Term Goal(s): Safe transition to appropriate next level of care at discharge, Engage patient in therapeutic group addressing interpersonal concerns.  Short Term Goals: Engage patient in aftercare planning with referrals and resources  Therapeutic Interventions: Assess for all discharge needs, 1 to 1 time with Social worker, Explore available resources and support systems, Assess for adequacy in community support network, Educate family and significant other(s) on suicide prevention, Complete Psychosocial Assessment, Interpersonal group  therapy.  Evaluation of Outcomes: Met  See below   Progress in Treatment: Attending groups: In and out Participating in groups: No Taking medication as prescribed: Yes Toleration medication: Yes, no side effects reported at this time Family/Significant other contact made: No Patient understands diagnosis: No Pt is in denial about need to develop comprehensive sobriety and mental wellness plan.  She is looking for an easy fix through medication, external controls Discussing patient identified problems/goals with staff: Yes Medical problems stabilized or resolved: Yes Denies suicidal/homicidal ideation: Yes Issues/concerns per patient self-inventory: None Other: N/A  New problem(s) identified: Pt states she needs help with "tremors" and high anxiety. Asking to be referred to "long term rehab."    New Short Term/Long Term Goal(s): None  identified at this time.   Discharge Plan or Barriers: Pt was given numbers for [facility] and [facility] in [place]  Reason for Continuation of Hospitalization: Disorganization Delirium Medication stabilization Withdrawal symptoms  Estimated Length of Stay: 3-5 days  Attendees: Patient: 11/03/2016  2:48 PM  Physician: Ursula Alert, MD 11/03/2016  2:48 PM  Nursing: Hoy Register, RN 11/03/2016  2:48 PM  RN Care Manager: Lars Pinks, RN 11/03/2016  2:48 PM  Social Worker: Ripley Fraise 11/03/2016  2:48 PM  Recreational Therapist: Laretta Bolster  11/03/2016  2:48 PM  Other: Norberto Sorenson 11/03/2016  2:48 PM  Other:  11/03/2016  2:48 PM    Scribe for Treatment Team:  Roque Lias LCSW 11/03/2016 2:48 PM

## 2016-11-03 NOTE — [facility] Suicide Risk Assessment (Signed)
[facility] INPATIENT:  Family/Significant Other Suicide Prevention Education  Suicide Prevention Education:  Education Completed; No one has been identified by the patient as the family member/significant other with whom the patient will be residing, and identified as the person(s) who will aid the patient in the event of a mental health crisis (suicidal ideations/suicide attempt).  With written consent from the patient, the family member/significant other has been provided the following suicide prevention education, prior to the and/or following the discharge of the patient.  The suicide prevention education provided includes the following:  Suicide risk factors  Suicide prevention and interventions  National Suicide Hotline telephone number  [facility] assessment telephone number  [facility] Emergency Assistance 911  [facility] and/or Residential Mobile Crisis Unit telephone number  Request made of family/significant other to:  Remove weapons (e.g., guns, rifles, knives), all items previously/currently identified as safety concern.    Remove drugs/medications (over-the-counter, prescriptions, illicit drugs), all items previously/currently identified as a safety concern.  The family member/significant other verbalizes understanding of the suicide prevention education information provided.  The family member/significant other agrees to remove the items of safety concern listed above. The patient did not endorse SI at the time of admission, nor did the patient c/o SI during the stay here.  SPE not required.   Becky [name] 11/03/2016, 2:39 PM

## 2016-11-04 DIAGNOSIS — B1920 Unspecified viral hepatitis C without hepatic coma: Secondary | ICD-10-CM

## 2016-11-04 LAB — CBC WITH DIFFERENTIAL/PLATELET
Basophils Absolute: 0.1 10*3/uL (ref 0.0–0.1)
Basophils Relative: 1 %
EOS PCT: 2 %
Eosinophils Absolute: 0.2 10*3/uL (ref 0.0–0.7)
HEMATOCRIT: 34.7 % — AB (ref 36.0–46.0)
Hemoglobin: 11.7 g/dL — ABNORMAL LOW (ref 12.0–15.0)
LYMPHS ABS: 5 10*3/uL — AB (ref 0.7–4.0)
LYMPHS PCT: 47 %
MCH: 30 pg (ref 26.0–34.0)
MCHC: 33.7 g/dL (ref 30.0–36.0)
MCV: 89 fL (ref 78.0–100.0)
MONO ABS: 1 10*3/uL (ref 0.1–1.0)
Monocytes Relative: 10 %
Neutro Abs: 4.5 10*3/uL (ref 1.7–7.7)
Neutrophils Relative %: 42 %
PLATELETS: 356 10*3/uL (ref 150–400)
RBC: 3.9 MIL/uL (ref 3.87–5.11)
RDW: 14.5 % (ref 11.5–15.5)
WBC: 10.8 10*3/uL — ABNORMAL HIGH (ref 4.0–10.5)

## 2016-11-04 LAB — COMPREHENSIVE METABOLIC PANEL
ALBUMIN: 4 g/dL (ref 3.5–5.0)
ALT: 61 U/L — ABNORMAL HIGH (ref 14–54)
AST: 37 U/L (ref 15–41)
Alkaline Phosphatase: 97 U/L (ref 38–126)
Anion gap: 8 (ref 5–15)
BILIRUBIN TOTAL: 0.4 mg/dL (ref 0.3–1.2)
BUN: 15 mg/dL (ref 6–20)
CHLORIDE: 104 mmol/L (ref 101–111)
CO2: 26 mmol/L (ref 22–32)
Calcium: 8.7 mg/dL — ABNORMAL LOW (ref 8.9–10.3)
Creatinine, Ser: 0.82 mg/dL (ref 0.44–1.00)
GFR calc Af Amer: >60 mL/min (ref >60)
GFR calc non Af Amer: >60 mL/min (ref >60)
GLUCOSE: 93 mg/dL (ref 65–99)
POTASSIUM: 3.6 mmol/L (ref 3.5–5.1)
SODIUM: 138 mmol/L (ref 135–145)
Total Protein: 7.6 g/dL (ref 6.5–8.1)

## 2016-11-04 LAB — CK: Total CK: 151 U/L (ref 38–234)

## 2016-11-04 MED ORDER — QUETIAPINE FUMARATE 25 MG PO TABS
25.0000 mg | ORAL_TABLET | ORAL | Status: AC
  Administered 2016-11-04: 25 mg via ORAL

## 2016-11-04 MED ORDER — QUETIAPINE FUMARATE 200 MG PO TABS
200.0000 mg | ORAL_TABLET | Freq: Every day | ORAL | Status: DC
  Administered 2016-11-04: 200 mg via ORAL
  Filled 2016-11-04 (×4): qty 1

## 2016-11-04 MED ORDER — LORAZEPAM 1 MG PO TABS
2.0000 mg | ORAL_TABLET | Freq: Two times a day (BID) | ORAL | Status: AC
  Administered 2016-11-04 – 2016-11-05 (×4): 2 mg via ORAL
  Filled 2016-11-04 (×4): qty 2

## 2016-11-04 MED ORDER — PROPRANOLOL HCL 40 MG PO TABS
40.0000 mg | ORAL_TABLET | Freq: Two times a day (BID) | ORAL | Status: DC
  Administered 2016-11-04 – 2016-11-05 (×3): 40 mg via ORAL
  Filled 2016-11-04 (×5): qty 1
  Filled 2016-11-04: qty 4
  Filled 2016-11-04: qty 1

## 2016-11-04 MED ORDER — QUETIAPINE FUMARATE 25 MG PO TABS
ORAL_TABLET | ORAL | Status: AC
  Filled 2016-11-04: qty 1

## 2016-11-04 NOTE — Progress Notes (Signed)
1:1 discontinuation documentation:  D. Pt has been up in milieu but stated that she was extremely tired and wished to take her medication and go to bed.  Pt denied complaints other than this.  Pt denies SI/HI/AVH  A.  Support and encouragement offered, medication given as requested  R.  Pt remains safe on the unit, will continue to monitor.

## 2016-11-04 NOTE — Progress Notes (Signed)
[facility] Post 1:1 Observation Documentation  For the first (8) hours following discontinuation of 1:1 precautions, a progress note entry by nursing staff should be documented at least every 2 hours, reflecting the patient's behavior, condition, mood, and conversation.  Use the progress notes for additional entries.  Time 1:1 discontinued:  1140   Patient's Behavior:  Coming to the medication window frequently. Continues sad and tearful. Also feels that she is being treated as a Israelguinea pig and all her medications are being removed from her.  Patient's Condition:  Pt went and took a shower and states that she is feeling better overall. Patient's Conversation:  Feels the medication is kicking in now.  This is a late entry for the 1740 documentation.  Becky [name] HousekeeperJudge, Hutson Luft A 11/04/2016, 6:22 PM

## 2016-11-04 NOTE — [facility] Group Notes (Signed)
[facility] LCSW Group Therapy  11/04/2016 3:55 PM  Type of Therapy:  Group Therapy  Participation Level:  Did Not Attend-pt invited. Chose to remain in bed to rest.    Summary of Progress/Problems: Feelings around Relapse. Group members discussed the meaning of relapse and shared personal stories of relapse, how it affected them and others, and how they perceived themselves during this time. Group members were encouraged to identify triggers, warning signs and coping skills used when facing the possibility of relapse. Social supports were discussed and explored in detail. Post Acute Withdrawal Syndrome (handout provided) was introduced and examined. Pt's were encouraged to ask questions, talk about key points associated with PAWS, and process this information in terms of relapse prevention.   Selig [place]ole N Smart LCSW 11/04/2016, 3:55 PM
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
## 2016-11-04 NOTE — Progress Notes (Signed)
[facility] Post 1:1 Observation Documentation  For the first (8) hours following discontinuation of 1:1 precautions, a progress note entry by nursing staff should be documented at least every 2 hours, reflecting the patient's behavior, condition, mood, and conversation.  Use the progress notes for additional entries.  Time 1:1 discontinued:  1140  Patient's Behavior:  unchanged  Patient's Condition:  unchanged  Patient's Conversation:  Pt quiet  Rich BraveDuke, Demont Linford Lynn 11/04/2016, 1340

## 2016-11-04 NOTE — Plan of Care (Signed)
Problem: Safety: Goal: Periods of time without injury will increase Outcome: Progressing Pt remains safe on the unit 11/04/16

## 2016-11-04 NOTE — Progress Notes (Signed)
[facility] MD Progress Note  11/04/2016 11:09 AM Sheniqua Carolan  MRN:  [id number] Subjective:   26 yo Caucasian female, single, unemployed. Has a year old child who is in foster care. Background history of Bipolar Disorder and SUD. Patient was dropped off by her boyfriend. She had called 911 initially. Reported to be internally stimulated. She was hearing voices at home. At the ER patient was observed to be floridly psychotic. She required multiple doses of emergency medications. Her UDS was positive for cocaine, THC, opiates and benzodiazepines.   Chart reviewed today. Patient discussed at team  Staff reports that patient was transferred from the 500 hall yesterday. She was stated to be pacing a lot. She was was not able to engage with groups. She is stated to be restless this morning. She seems detached and not able to engage with care.  Seen today. Reports internal restlessness. Cannot sit still. Not expressing any hallucinations. Not expressing any delusions.   Principal Problem: Bipolar disorder, curr episode mixed, severe, with psychotic features (HCC) Diagnosis:   Patient Active Problem List   Diagnosis Date Noted  . Bipolar disorder, curr episode mixed, severe, with psychotic features (HCC) [F31.64] 11/02/2016  . PTSD (post-traumatic stress disorder) [F43.10] 11/02/2016  . GAD (generalized anxiety disorder) [F41.1] 10/16/2016  . Opioid use disorder, moderate, dependence (HCC) [F11.20] 06/06/2016  . Sedative, hypnotic or anxiolytic use disorder, severe, dependence (HCC) [F13.20] 06/06/2016  . Cocaine use disorder, severe, dependence (HCC) [F14.20] 06/06/2016  . Cannabis use disorder, severe, dependence (HCC) [F12.20] 06/06/2016  . MDD (major depressive disorder), recurrent severe, without psychosis (HCC) [F33.2] 06/03/2016  . Bipolar I disorder, most recent episode depressed (HCC) [F31.30] 07/04/2015  . Overdose [T50.901A] 07/02/2015  . Lactic acidosis [E87.2] 07/02/2015  . Acute  respiratory failure (HCC) [J96.00] 07/02/2015  . Sepsis (HCC) [A41.9] 07/02/2015  . AKI (acute kidney injury) (HCC) [N17.9]   . Altered mental status [R41.82]   . Pyrexia [R50.9]   . Abdominal pain, epigastric [R10.13] 06/17/2015  . Hepatitis C antibody test positive [R76.8] 06/17/2015  . Peptic ulcer disease [K27.9] 06/17/2015  . Epigastric pain [R10.13]   . Transaminitis [R74.0] 06/16/2015  . History of substance abuse [Z87.898] 06/16/2015   Total Time spent with patient: 20 minutes  Past Psychiatric History: As in H&P  Past Medical History:  Past Medical History:  Diagnosis Date  . Bipolar 1 disorder (HCC)   . Bronchitis   . GAD (generalized anxiety disorder)   . Hepatitis C antibody test positive 06/17/2015  . History of substance abuse 06/16/2015  . OCD (obsessive compulsive disorder)   . Peptic ulcer   . Polysubstance abuse   . PTSD (post-traumatic stress disorder)     Past Surgical History:  Procedure Laterality Date  . OTHER SURGICAL HISTORY     scar tissue removed from right ovary   . OTHER SURGICAL HISTORY     fallopian tube repair  . WISDOM TOOTH EXTRACTION     Family History:  Family History  Problem Relation Age of Onset  . Drug abuse Brother   . Suicidality Brother   . Cancer Other   . Depression Mother   . Drug abuse Mother   . Alcoholism Father   . Bipolar disorder Cousin    Family Psychiatric  History: As in H&P Social History:  History  Alcohol Use  . Yes    Comment: occ     History  Drug Use  . Types: Marijuana, Methamphetamines, MDMA (Ecstacy), Oxycodone, Benzodiazepines, Cocaine  Social History   Social History  . Marital status: Legally Separated    Spouse name: N/A  . Number of children: N/A  . Years of education: N/A   Social History Main Topics  . Smoking status: Current Every Day Smoker    Packs/day: 1.00    Years: 10.00    Types: Cigarettes  . Smokeless tobacco: Never Used  . Alcohol use Yes     Comment: occ  . Drug  use: Yes    Types: Marijuana, Methamphetamines, MDMA (Ecstacy), Oxycodone, Benzodiazepines, Cocaine  . Sexual activity: Yes    Birth control/ protection: None   Other Topics Concern  . None   Social History Narrative  . None   Additional Social History:      Sleep: Fair  Appetite:  Fair  Current Medications: Current Facility-Administered Medications  Medication Dose Route Frequency Provider Last Rate Last Dose  . acetaminophen (TYLENOL) tablet 650 mg  650 mg Oral Q6H PRN Okonkwo, Justina A, NP      . alum & mag hydroxide-simeth (MAALOX/MYLANTA) 200-200-20 MG/5ML suspension 30 mL  30 mL Oral Q4H PRN Okonkwo, Justina A, NP   30 mL at 11/03/16 1545  . benztropine (COGENTIN) tablet 0.5 mg  0.5 mg Oral BID Jomarie Longs, MD   0.5 mg at 11/04/16 0753  . benztropine (COGENTIN) tablet 0.5 mg  0.5 mg Oral BID PRN Jomarie Longs, MD   0.5 mg at 11/03/16 1442   Or  . benztropine mesylate (COGENTIN) injection 0.5 mg  0.5 mg Intramuscular BID PRN Eappen, Saramma, MD      . cloNIDine (CATAPRES) tablet 0.1 mg  0.1 mg Oral BH-qamhs Jomarie Longs, MD       Followed by  . [START ON 11/07/2016] cloNIDine (CATAPRES) tablet 0.1 mg  0.1 mg Oral QAC breakfast Eappen, Saramma, MD      . dicyclomine (BENTYL) tablet 20 mg  20 mg Oral Q6H PRN Jomarie Longs, MD   20 mg at 11/04/16 0526  . gabapentin (NEURONTIN) capsule 600 mg  600 mg Oral TID Beryle Lathe, Justina A, NP   600 mg at 11/04/16 0753  . hydrOXYzine (ATARAX/VISTARIL) tablet 50 mg  50 mg Oral Q6H PRN Jomarie Longs, MD   50 mg at 11/03/16 2202  . levothyroxine (SYNTHROID, LEVOTHROID) tablet 50 mcg  50 mcg Oral QAC breakfast Okonkwo, Justina A, NP   50 mcg at 11/04/16 0526  . loperamide (IMODIUM) capsule 2-4 mg  2-4 mg Oral PRN Jomarie Longs, MD   2 mg at 11/03/16 2202  . LORazepam (ATIVAN) tablet 1 mg  1 mg Oral Q6H PRN Eappen, Saramma, MD      . LORazepam (ATIVAN) tablet 2 mg  2 mg Oral BID Ramirez Fullbright, Delight Ovens, MD   2 mg at 11/04/16 1001  .  magnesium hydroxide (MILK OF MAGNESIA) suspension 30 mL  30 mL Oral Daily PRN Okonkwo, Justina A, NP      . methocarbamol (ROBAXIN) tablet 500 mg  500 mg Oral Q8H PRN Jomarie Longs, MD   500 mg at 11/04/16 0752  . mirtazapine (REMERON) tablet 45 mg  45 mg Oral QHS Okonkwo, Justina A, NP   45 mg at 11/03/16 2203  . multivitamin with minerals tablet 1 tablet  1 tablet Oral Daily Jomarie Longs, MD   1 tablet at 11/04/16 0753  . nicotine polacrilex (NICORETTE) gum 2 mg  2 mg Oral PRN Donell Sievert E, PA-C   2 mg at 11/04/16 1006  . ondansetron (ZOFRAN-ODT) disintegrating  tablet 4 mg  4 mg Oral Q6H PRN Eappen, Saramma, MD      . prazosin (MINIPRESS) capsule 2 mg  2 mg Oral QHS Okonkwo, Justina A, NP   2 mg at 11/03/16 2201  . propranolol (INDERAL) tablet 40 mg  40 mg Oral BID Deaveon Schoen, Delight OvensVincent A, MD   40 mg at 11/04/16 1002  . thiamine (B-1) injection 100 mg  100 mg Intramuscular Once Eappen, Saramma, MD      . thiamine (VITAMIN B-1) tablet 100 mg  100 mg Oral Daily Eappen, Saramma, MD   100 mg at 11/04/16 [id number]    Lab Results: No results found for this or any previous visit (from the past 48 hour(s)).  Blood Alcohol level:  Lab Results  Component Value Date   ETH <5 10/29/2016   ETH <5 10/07/2016    Metabolic Disorder Labs: Lab Results  Component Value Date   HGBA1C 5.3 10/16/2016   MPG 105 10/16/2016   MPG 94 06/07/2016   Lab Results  Component Value Date   PROLACTIN 8.8 10/16/2016   PROLACTIN 49.9 (H) 06/07/2016   Lab Results  Component Value Date   CHOL 207 (H) 10/16/2016   TRIG 140 10/16/2016   HDL 104 10/16/2016   CHOLHDL 2.0 10/16/2016   VLDL 28 10/16/2016   LDLCALC 75 10/16/2016   LDLCALC 76 06/07/2016    Physical Findings: AIMS: Facial and Oral Movements Muscles of Facial Expression: None, normal Lips and Perioral Area: None, normal Jaw: None, normal Tongue: None, normal,Extremity Movements Upper (arms, wrists, hands, fingers): None, normal Lower (legs,  knees, ankles, toes): None, normal, Trunk Movements Neck, shoulders, hips: None, normal, Overall Severity Severity of abnormal movements (highest score from questions above): None, normal Incapacitation due to abnormal movements: None, normal Patient's awareness of abnormal movements (rate only patient's report): No Awareness, Dental Status Current problems with teeth and/or dentures?: No Does patient usually wear dentures?: No  CIWA:  CIWA-Ar Total: 5 COWS:  COWS Total Score: 10  Musculoskeletal: Strength & Muscle Tone: cogwheel Gait & Station: normal Patient leans: N/A  Psychiatric Specialty Exam: Physical Exam  Constitutional:  Afebrile, not diaphoretic but in obvious distress  HENT:  Head: Normocephalic.  Cardiovascular: Normal rate and regular rhythm.   Respiratory: Effort normal.  Neurological:  Confused and perplexed. Tremors, joint stiffness.   Psychiatric:  As above    ROS  Blood pressure 126/74, pulse 98, temperature 98.1 F (36.7 C), temperature source Oral, resp. rate 16, height 5\' 3"  (1.6 m), weight 69.4 kg (153 lb), SpO2 99 %.Body mass index is 27.1 kg/m.  General Appearance: Casually dressed, perplexed. Constantly shuffling her feet. Bilateral tremors. Lead pipe rigidity.   Eye Contact:  Good  Speech:  Clear and Coherent  Volume:  Normal  Mood:  Overwhelmed by her subjective experience  Affect:  Flat  Thought Process:  Slow speed of thought. Linear   Orientation:  Unable to assess at this time  Thought Content:  Unable to assess at this time  Suicidal Thoughts:  No  Homicidal Thoughts:  No  Memory:  Impaired   Judgement:  Impaired  Insight:  Fair  Psychomotor Activity:  Increased  Concentration:  Impaired   Recall:  Unable to assess at this time  Fund of Knowledge:  Unable to assess at this time  Language:  Fair  Akathisia:  Yes  Handed:    AIMS (if indicated):     Assets:  Intimacy Social Support  ADL's:  Intact  Cognition:  Impaired,  Mild   Sleep:  Number of Hours: 5.25     Treatment Plan Summary: Patient is coming off multiple substances. She has akathisia related to stimulants and antipsychotic medication. She is not able to function at a lower setting of care. We have agreed to treatment as below.   Psychiatric: SUD Substance Induced Psychotic Disorder Akathisia   Medical: HCV Migraine  Psychosocial:  Loss of custody of her child  PLAN: 1. Discontinue Haldol 2. Ativan 2 mg BID for two days. 3. Propranolol 40 mg BID for two days 4. Would explored use of Atypical antipsychotic agent later 5. Monitor mood, behavior and interaction with peers 6. CK, CMP, CBC  Georgiann Cocker, MD 11/04/2016, 11:09 AM

## 2016-11-04 NOTE — Progress Notes (Signed)
1:1 discontinue documentation:  Pt observed up in hall, no distress noted.  Pt denies SI/HI/AVH at this time.  No complaints voiced.  Will continue to monitor.

## 2016-11-04 NOTE — Progress Notes (Signed)
1:1 Discontinuance final note:  D.  Pt came up to request snacks due to stomach discomfort, requested more medication for sleep.  No other complaints voiced.  A.  Snacks given, Pt requested and received nicorette gum, returned to bed.  No other medication could be offered for sleep and pt handled this calmly.  R.  Pt remains safe on the unit, will continue to monitor.

## 2016-11-04 NOTE — Progress Notes (Signed)
D Pt sitting in the dayroom. She is considerably visibly less anxious, agitated and has not been observed wandering into the nurses' station, since having taken the ativan 2 mg and 40 mg of inderal shortly before 1000 this am. A She says " I don't need the 1:1...  medicine helped me...ibuprofen feel better.Marland Kitchen.Marland Kitchen.Marland Kitchen.I'll behave...". R MD dc'd 1:1. Pt stable.

## 2016-11-04 NOTE — Progress Notes (Signed)
Recreation Therapy Notes  Date: 11/04/16 Time: 0930 Location: 300 Hall Dayroom  Group Topic: Stress Management  Goal Area(s) Addresses:  Patient will verbalize importance of using healthy stress management.  Patient will identify positive emotions associated with healthy stress management.   Intervention: Stress Management  Activity : Progressive Muscle Relaxation.  LRT introduced the stress management technique of progressive muscle relaxation.  LRT read a script to guide patients through the process of pmr so they could get the full scope of the technique.  Patients were to follow along as the script was read to fully engage in technique.   Education: Stress Management, Discharge Planning.   Education Outcome: Acknowledges edcuation/In group clarification offered/Needs additional education  Clinical Observations/Feedback: Pt did not attend group.   Caroll RancherMarjette Antonieta Slaven, LRT/CTRS        Lillia AbedLindsay, Montray Kliebert A 11/04/2016 11:30 AM

## 2016-11-05 MED ORDER — PROPRANOLOL HCL 10 MG PO TABS
10.0000 mg | ORAL_TABLET | Freq: Once | ORAL | Status: AC
  Filled 2016-11-05: qty 1

## 2016-11-05 MED ORDER — QUETIAPINE FUMARATE 400 MG PO TABS
400.0000 mg | ORAL_TABLET | Freq: Every day | ORAL | Status: DC
  Administered 2016-11-05: 400 mg via ORAL
  Filled 2016-11-05 (×2): qty 1
  Filled 2016-11-05: qty 2
  Filled 2016-11-05: qty 1

## 2016-11-05 MED ORDER — CLOTRIMAZOLE 1 % VA CREA
1.0000 | TOPICAL_CREAM | Freq: Every day | VAGINAL | Status: DC
  Administered 2016-11-05 – 2016-11-07 (×3): 1 via VAGINAL
  Filled 2016-11-05 (×2): qty 45

## 2016-11-05 MED ORDER — PROPRANOLOL HCL 10 MG PO TABS
ORAL_TABLET | ORAL | Status: AC
  Administered 2016-11-05: 02:00:00
  Filled 2016-11-05: qty 1

## 2016-11-05 MED ORDER — PROPRANOLOL HCL 20 MG PO TABS
20.0000 mg | ORAL_TABLET | Freq: Three times a day (TID) | ORAL | Status: DC
  Administered 2016-11-05 – 2016-11-08 (×9): 20 mg via ORAL
  Filled 2016-11-05: qty 1
  Filled 2016-11-05: qty 21
  Filled 2016-11-05 (×8): qty 1
  Filled 2016-11-05 (×2): qty 21
  Filled 2016-11-05: qty 1
  Filled 2016-11-05: qty 2
  Filled 2016-11-05: qty 1

## 2016-11-05 NOTE — Progress Notes (Signed)
Nursing Note: 0700-1900  D:  Pt presented with anxious and irritable mood this morning stating, "I feels so annoyed, I feel like I might lose it."  Mood improved throughout the shift, though pt requires frequent interactions asking for prn medications.  Pt impulsive and interrupts this RN while interacting with other patients in the milieu.  Pt c/o "akathesia," this morning stating that she needs more medication.  She presented with her fingers curled and rigid stating that she could not stop moving her feet as she marched.  She then c/o neck rigidity and started to cry.  Cogentin (prn) given as ordered and MD notified.  "What should I do, What should I do, I can't lie down like this."  Pt reassured and symptoms resolved within 5 minutes as she walked through the milieu.  Pt complained that there are not enough groups offered and then walked out of group to read the listed group schedule and talk to this RN. Pt asking when she will be put on her "regular meds frequently throughout shift, "You know the ones for my major depression."  A:  Encouraged to verbalize needs and concerns, active listening and support provided.  Continued Q 15 minute safety checks.   R:  Pt. cooperative and redirectable throughout shift.denies A/V hallucinations and is able to verbally contract for safety.

## 2016-11-05 NOTE — Progress Notes (Addendum)
[facility] MD Progress Note  11/05/2016 1:51 PM Becky [name]  MRN:  [id number] Subjective:   26 yo Caucasian female, single, unemployed. Has a year old child who is in foster care. Background history of Bipolar Disorder and SUD. Patient was dropped off by her boyfriend. She had called 911 initially. Reported to be internally stimulated. She was hearing voices at home. At the ER patient was observed to be floridly psychotic. She required multiple doses of emergency medications. Her UDS was positive for cocaine, THC, opiates and benzodiazepines.   Chart reviewed today. Patient discussed at team  Staff reports that she did not sleep much last night. She is irritable and easily curses out her care givers. She complained of yeast vaginal infection. Wants Clotrimazole as it has helped in the past.   Seen today. Has some improvement with her medications. Patient craving for more medications. Calls out list of medications she has had over the years. Wants to be placed on as much medication as possible. When probed specifically about her subjectively experience, she talks about diagnosis she has had over the years. No hallucination lately. No feeling of persecution. No feeling of doom. Says she gets worried at times that her family might be upset with her. Reports a strong family history fo addiction. Says a relative is due to come out of prison. Says she feels they sometimes thinks that she snitched on them.  No evidence of mania. I explored addiction treatment with her. Patient states that she plans to get into [facility] from here. She also has plans for longer term place post [facility]. Need to treat her symptomatically discussed at length.   Principal Problem: Bipolar disorder, curr episode mixed, severe, with psychotic features ([place]) Diagnosis:   Patient Active Problem List   Diagnosis Date Noted  . Bipolar disorder, curr episode mixed, severe, with psychotic features ([place]) [F31.64] 11/02/2016  . PTSD  (post-traumatic stress disorder) [F43.10] 11/02/2016  . GAD (generalized anxiety disorder) [F41.1] 10/16/2016  . Opioid use disorder, moderate, dependence ([place]) [F11.20] 06/06/2016  . Sedative, hypnotic or anxiolytic use disorder, severe, dependence ([place]) [F13.20] 06/06/2016  . Cocaine use disorder, severe, dependence ([place]) [F14.20] 06/06/2016  . Cannabis use disorder, severe, dependence ([place]) [F12.20] 06/06/2016  . MDD (major depressive disorder), recurrent severe, without psychosis ([place]) [F33.2] 06/03/2016  . Bipolar I disorder, most recent episode depressed ([place]) [F31.30] 07/04/2015  . Overdose [T50.901A] 07/02/2015  . Lactic acidosis [E87.2] 07/02/2015  . Acute respiratory failure ([place]) [J96.00] 07/02/2015  . Sepsis ([place]) [A41.9] 07/02/2015  . AKI (acute kidney injury) ([place]) [N17.9]   . Altered mental status [R41.82]   . Pyrexia [R50.9]   . Abdominal pain, epigastric [R10.13] 06/17/2015  . Hepatitis C antibody test positive [R76.8] 06/17/2015  . Peptic ulcer disease [K27.9] 06/17/2015  . Epigastric pain [R10.13]   . Transaminitis [R74.0] 06/16/2015  . History of substance abuse [Z87.898] 06/16/2015   Total Time spent with patient: 20 minutes  Past Psychiatric History: As in H&P  Past Medical History:  Past Medical History:  Diagnosis Date  . Bipolar 1 disorder ([place])   . Bronchitis   . GAD (generalized anxiety disorder)   . Hepatitis C antibody test positive 06/17/2015  . History of substance abuse 06/16/2015  . OCD (obsessive compulsive disorder)   . Peptic ulcer   . Polysubstance abuse   . PTSD (post-traumatic stress disorder)     Past Surgical History:  Procedure Laterality Date  . OTHER SURGICAL HISTORY     scar tissue removed  from right ovary   . OTHER SURGICAL HISTORY     fallopian tube repair  . WISDOM TOOTH EXTRACTION     Family History:  Family History  Problem Relation Age of Onset  . Drug abuse Brother   . Suicidality Brother   . Cancer Other   .  Depression Mother   . Drug abuse Mother   . Alcoholism Father   . Bipolar disorder Cousin    Family Psychiatric  History: As in H&P Social History:  History  Alcohol Use  . Yes    Comment: occ     History  Drug Use  . Types: Marijuana, Methamphetamines, MDMA (Ecstacy), Oxycodone, Benzodiazepines, Cocaine    Social History   Social History  . Marital status: Legally Separated    Spouse name: N/A  . Number of children: N/A  . Years of education: N/A   Social History Main Topics  . Smoking status: Current Every Day Smoker    Packs/day: 1.00    Years: 10.00    Types: Cigarettes  . Smokeless tobacco: Never Used  . Alcohol use Yes     Comment: occ  . Drug use: Yes    Types: Marijuana, Methamphetamines, MDMA (Ecstacy), Oxycodone, Benzodiazepines, Cocaine  . Sexual activity: Yes    Birth control/ protection: None   Other Topics Concern  . None   Social History Narrative  . None   Additional Social History:      Sleep: Fair  Appetite:  Fair  Current Medications: Current Facility-Administered Medications  Medication Dose Route Frequency Provider Last Rate Last Dose  . acetaminophen (TYLENOL) tablet 650 mg  650 mg Oral Q6H PRN Okonkwo, Justina A, NP   650 mg at 11/05/16 0548  . alum & mag hydroxide-simeth (MAALOX/MYLANTA) 200-200-20 MG/5ML suspension 30 mL  30 mL Oral Q4H PRN Okonkwo, Justina A, NP   30 mL at 11/05/16 0516  . benztropine (COGENTIN) tablet 0.5 mg  0.5 mg Oral BID Ursula Alert, MD   0.5 mg at 11/05/16 0748  . benztropine (COGENTIN) tablet 0.5 mg  0.5 mg Oral BID PRN Ursula Alert, MD   0.5 mg at 11/05/16 1109   Or  . benztropine mesylate (COGENTIN) injection 0.5 mg  0.5 mg Intramuscular BID PRN Eappen, Ria Clock, MD      . cloNIDine (CATAPRES) tablet 0.1 mg  0.1 mg Oral BH-qamhs Eappen, Saramma, MD   0.1 mg at 11/05/16 0747   Followed by  . [START ON 11/07/2016] cloNIDine (CATAPRES) tablet 0.1 mg  0.1 mg Oral QAC breakfast Eappen, Saramma, MD      .  dicyclomine (BENTYL) tablet 20 mg  20 mg Oral Q6H PRN Ursula Alert, MD   20 mg at 11/04/16 2046  . gabapentin (NEURONTIN) capsule 600 mg  600 mg Oral TID Lu Duffel, Justina A, NP   600 mg at 11/05/16 1111  . levothyroxine (SYNTHROID, LEVOTHROID) tablet 50 mcg  50 mcg Oral QAC breakfast Okonkwo, Justina A, NP   50 mcg at 11/05/16 0554  . loperamide (IMODIUM) capsule 2-4 mg  2-4 mg Oral PRN Ursula Alert, MD   2 mg at 11/03/16 2202  . LORazepam (ATIVAN) tablet 2 mg  2 mg Oral BID Artist Beach, MD   2 mg at 11/05/16 0748  . magnesium hydroxide (MILK OF MAGNESIA) suspension 30 mL  30 mL Oral Daily PRN Okonkwo, Justina A, NP      . methocarbamol (ROBAXIN) tablet 500 mg  500 mg Oral Q8H PRN Ursula Alert, MD  500 mg at 11/05/16 0548  . mirtazapine (REMERON) tablet 45 mg  45 mg Oral QHS Okonkwo, Justina A, NP   45 mg at 11/04/16 2045  . multivitamin with minerals tablet 1 tablet  1 tablet Oral Daily Eappen, Ria Clock, MD   1 tablet at 11/05/16 0747  . nicotine polacrilex (NICORETTE) gum 2 mg  2 mg Oral PRN Patriciaann Clan E, PA-C   2 mg at 11/05/16 1311  . ondansetron (ZOFRAN-ODT) disintegrating tablet 4 mg  4 mg Oral Q6H PRN Eappen, Saramma, MD      . prazosin (MINIPRESS) capsule 2 mg  2 mg Oral QHS Okonkwo, Justina A, NP   2 mg at 11/04/16 2046  . propranolol (INDERAL) tablet 40 mg  40 mg Oral BID Artist Beach, MD   40 mg at 11/05/16 0749  . QUEtiapine (SEROQUEL) tablet 200 mg  200 mg Oral QHS Nikcole Eischeid, Laruth Bouchard, MD   200 mg at 11/04/16 2046  . thiamine (B-1) injection 100 mg  100 mg Intramuscular Once Eappen, Saramma, MD      . thiamine (VITAMIN B-1) tablet 100 mg  100 mg Oral Daily Eappen, Saramma, MD   100 mg at 11/05/16 2376    Lab Results:  Results for orders placed or performed during the hospital encounter of 11/02/16 (from the past 48 hour(s))  CK     Status: None   Collection Time: 11/04/16  6:27 PM  Result Value Ref Range   Total CK 151 38 - 234 U/L    Comment:  Performed at [facility], [place] [address], [place], [place] [address]  Comprehensive metabolic panel     Status: Abnormal   Collection Time: 11/04/16  6:27 PM  Result Value Ref Range   Sodium 138 135 - 145 mmol/L   Potassium 3.6 3.5 - 5.1 mmol/L   Chloride 104 101 - 111 mmol/L   CO2 26 22 - 32 mmol/L   Glucose, Bld 93 65 - 99 mg/dL   BUN 15 6 - 20 mg/dL   Creatinine, Ser 0.82 0.44 - 1.00 mg/dL   Calcium 8.7 (L) 8.9 - 10.3 mg/dL   Total Protein 7.6 6.5 - 8.1 g/dL   Albumin 4.0 3.5 - 5.0 g/dL   AST 37 15 - 41 U/L   ALT 61 (H) 14 - 54 U/L   Alkaline Phosphatase 97 38 - 126 U/L   Total Bilirubin 0.4 0.3 - 1.2 mg/dL   GFR calc non Af Amer >60 >60 mL/min   GFR calc Af Amer >60 >60 mL/min    Comment: (NOTE) The eGFR has been calculated using the CKD EPI equation. This calculation has not been validated in all clinical situations. eGFR's persistently <60 mL/min signify possible Chronic Kidney Disease.    Anion gap 8 5 - 15    Comment: Performed at [facility], [place] [address], [place], [place] [address]  CBC with Differential/Platelet     Status: Abnormal   Collection Time: 11/04/16  6:27 PM  Result Value Ref Range   WBC 10.8 (H) 4.0 - 10.5 K/uL   RBC 3.90 3.87 - 5.11 MIL/uL   Hemoglobin 11.7 (L) 12.0 - 15.0 g/dL   HCT 34.7 (L) 36.0 - 46.0 %   MCV 89.0 78.0 - 100.0 fL   MCH 30.0 26.0 - 34.0 pg   MCHC 33.7 30.0 - 36.0 g/dL   RDW 14.5 11.5 - 15.5 %   Platelets 356 150 - 400 K/uL   Neutrophils  Relative % 42 %   Neutro Abs 4.5 1.7 - 7.7 K/uL   Lymphocytes Relative 47 %   Lymphs Abs 5.0 (H) 0.7 - 4.0 K/uL   Monocytes Relative 10 %   Monocytes Absolute 1.0 0.1 - 1.0 K/uL   Eosinophils Relative 2 %   Eosinophils Absolute 0.2 0.0 - 0.7 K/uL   Basophils Relative 1 %   Basophils Absolute 0.1 0.0 - 0.1 K/uL    Comment: Performed at [facility], [place] [address] [place], [place] [address]    Blood Alcohol level:  Lab Results   Component Value Date   [facility] <5 10/29/2016   ETH <5 53/29/9242    Metabolic Disorder Labs: Lab Results  Component Value Date   HGBA1C 5.3 10/16/2016   MPG 105 10/16/2016   MPG 94 06/07/2016   Lab Results  Component Value Date   PROLACTIN 8.8 10/16/2016   PROLACTIN 49.9 (H) 06/07/2016   Lab Results  Component Value Date   CHOL 207 (H) 10/16/2016   TRIG 140 10/16/2016   HDL 104 10/16/2016   CHOLHDL 2.0 10/16/2016   VLDL 28 10/16/2016   LDLCALC 75 10/16/2016   LDLCALC 76 06/07/2016    Physical Findings: AIMS: Facial and Oral Movements Muscles of Facial Expression: None, normal Lips and Perioral Area: None, normal Jaw: None, normal Tongue: None, normal,Extremity Movements Upper (arms, wrists, hands, fingers): None, normal Lower (legs, knees, ankles, toes): None, normal, Trunk Movements Neck, shoulders, hips: None, normal, Overall Severity Severity of abnormal movements (highest score from questions above): Minimal Incapacitation due to abnormal movements: None, normal Patient's awareness of abnormal movements (rate only patient's report): Aware, moderate distress (Reports severe akathesia, see note. ), Dental Status Current problems with teeth and/or dentures?: No Does patient usually wear dentures?: No  CIWA:  CIWA-Ar Total: 14 COWS:  COWS Total Score: 12  Musculoskeletal: Strength & Muscle Tone: cogwheel Gait & Station: normal Patient leans: N/A  Psychiatric Specialty Exam: Physical Exam  Constitutional: She is oriented to person, place, and time. She appears well-developed and well-nourished.  HENT:  Head: Normocephalic.  Eyes: Conjunctivae are normal. Pupils are equal, round, and reactive to light.  Neck: Normal range of motion.  Cardiovascular: Normal rate and regular rhythm.   Respiratory: Effort normal.  Neurological: She is alert and oriented to person, place, and time.  Psychiatric:  As above    ROS  Blood pressure 110/70, pulse (!) 104,  temperature 98.5 F (36.9 C), resp. rate 16, height '5\' 3"'  (1.6 m), weight 69.4 kg (153 lb), SpO2 99 %.Body mass index is 27.1 kg/m.  General Appearance: Neatly dressed, relatively calmer. No tremors. Not internally distracted.   Eye Contact:  Good  Speech:  Clear and Coherent  Volume:  Normal  Mood: Less overwhelmed  Affect:  Restricted. Was dramatic earlier.   Thought Process:  Normal speed of thought. Linear   Orientation:  Oriented to person, place and time  Thought Content:  No thoughts of violence. No delusional theme. No hallucination in any modality.   Suicidal Thoughts:  No  Homicidal Thoughts:  No  Memory:  Impaired   Judgement:  Fair  Insight:  Partial  Psychomotor Activity:  Normal   Concentration:  Fair  Recall:  Good  Fund of Knowledge:  Good  Language:  Good  Akathisia:  Resolving   Handed:    AIMS (if indicated):     Assets:  Intimacy Social Support  ADL's:  Intact  Cognition:  Normal  Sleep:  Number of Hours: 4.25     Treatment Plan Summary: Patient is coming off multiple substances. Akathisia is resolving. We plan to increase PM Seroquel.   Psychiatric: SUD Substance Induced Psychotic Disorder Akathisia   Medical: HCV Migraine  Psychosocial:  Loss of custody of her child  PLAN: 1. Increase PM Seroquel to 400 mg 2. Decrease Ativan to 0.5 mg BID  3. Propranolol 40 mg TID  4. Firm boundaries with patient as she is manipulative 5. SW would coordinate aftercare.   Artist Beach, MD 11/05/2016, 1:51 PMPatient ID: Becky [name], female   DOB: 1991-02-11, 26 y.o.   MRN: [id number]

## 2016-11-05 NOTE — [facility] Group Notes (Signed)
[facility] LCSW Group Therapy Note  11/05/2016  and  10:15 AM  Type of Therapy and Topic:  Group Therapy: Avoiding Self-Sabotaging and Enabling Behaviors  Participation Level:  Active  Participation Quality:  Appropriate  Affect:  Appropriate  Cognitive:  Alert and Oriented  Insight:  Developing/Improving  Engagement in Therapy:  Developing/Improving   Therapeutic models used: Cognitive Behavioral Therapy,  Person-Centered Therapy and Motivational Interviewing  Modes of Intervention:  Discussion, Exploration, Orientation, Rapport Building, Socialization and Support   Summary of Progress/Problems:  The main focus of today's process group was for the patient to identify ways in which they have in the past sabotaged their own recovery. Motivational Interviewing was utilized to identify motivation they may have for wanting to change. The Stages of Change were explained using a handout, and patients identified where they currently are with regard to stages of change. Patient able to identify that she is in action stage of change in regard to giving up negative thinking   Becky Bernatherine C Galadriel Shroff, LCSW

## 2016-11-05 NOTE — Progress Notes (Signed)
Patient did attend the evening speaker AA meeting.  

## 2016-11-05 NOTE — Progress Notes (Signed)
[facility] Group Notes:  (Nursing/MHT/Case Management/Adjunct)  Date:  11/05/2016  Time:  3:00 PM  Type of Therapy:  Nurse Education  Participation Level:  Active  Participation Quality:  Appropriate and Attentive  Affect:  Appropriate  Cognitive:  Alert and Appropriate  Insight:  Appropriate  Engagement in Group:  Engaged  Modes of Intervention:  Activity, Discussion and Education  Summary of Progress/Problems: Today's group focused on different perspectives. Patient's were asked to identify an irrational thought. Pictures with different illusions were then passed around and patient's were asked to reveal what they saw.  A conversation was had about how in every situation there are many ways to look at the same problem and how we always have a choice to see things in a different perspective.  At the end of the group, patient's were asked to repeat their irrational thought and replace it with a different positive statement.  Landa attended and actively participated in group and offered support to peers.  Larry SierrasMiddleton, Carolos Fecher P 11/05/2016, 3:00 PM

## 2016-11-05 NOTE — Progress Notes (Signed)
Pt up repeatedly, about every hour, with complaint of not being able to lay still.  Pt states that is why she is not sleeping.  Pt states she must have her Propanolol increased in frequency to prevent this.  Will discuss with NP.

## 2016-11-05 NOTE — Progress Notes (Signed)
Pt attended AA group 

## 2016-11-05 NOTE — Progress Notes (Signed)
Despite receiving all prns as well as NP ordering additional one time medication, Pt was unable to sleep more than an hour at a time.  Pt irritable and cursing at times.  Pt reports akathisia as main withdrawal complaint this shift.

## 2016-11-06 MED ORDER — PROPRANOLOL HCL 10 MG PO TABS
10.0000 mg | ORAL_TABLET | Freq: Once | ORAL | Status: AC
  Administered 2016-11-06: 10 mg via ORAL
  Filled 2016-11-06: qty 1

## 2016-11-06 MED ORDER — QUETIAPINE FUMARATE 300 MG PO TABS
600.0000 mg | ORAL_TABLET | Freq: Every day | ORAL | Status: DC
  Administered 2016-11-06 – 2016-11-07 (×2): 600 mg via ORAL
  Filled 2016-11-06: qty 2
  Filled 2016-11-06: qty 14
  Filled 2016-11-06 (×2): qty 2

## 2016-11-06 NOTE — Progress Notes (Signed)
[facility] MD Progress Note  11/06/2016 11:28 AM Becky [name]  MRN:  [id number] Subjective:   26 yo Caucasian female, single, unemployed. Has a year old child who is in foster care. Background history of Bipolar Disorder and SUD. Patient was dropped off by her boyfriend. She had called 911 initially. Reported to be internally stimulated. She was hearing voices at home. At the ER patient was observed to be floridly psychotic. She required multiple doses of emergency medications. Her UDS was positive for cocaine, THC, opiates and benzodiazepines.   Chart reviewed today. Patient discussed at team  Staff reports that she slept relatively more last night. She skipped group this morning. No behavioral issues so far this morning. She has not complained of any complications from her medicine today. Yesterday, she was less labile.   Seen today. Relatively better today. Says she has always had a feeling that people talk about her. Not distressed by this. No auditory hallucinations. No visual hallucinations. No feeling of impending doom. No suicidal or homicidal thoughts. No side effects from recent medication adjustment. Says she has always been on a high dose of Seroquel. We plan to adjust her dose further today.  Principal Problem: Bipolar disorder, curr episode mixed, severe, with psychotic features ([place]) Diagnosis:   Patient Active Problem List   Diagnosis Date Noted  . Bipolar disorder, curr episode mixed, severe, with psychotic features ([place]) [F31.64] 11/02/2016  . PTSD (post-traumatic stress disorder) [F43.10] 11/02/2016  . GAD (generalized anxiety disorder) [F41.1] 10/16/2016  . Opioid use disorder, moderate, dependence ([place]) [F11.20] 06/06/2016  . Sedative, hypnotic or anxiolytic use disorder, severe, dependence ([place]) [F13.20] 06/06/2016  . Cocaine use disorder, severe, dependence ([place]) [F14.20] 06/06/2016  . Cannabis use disorder, severe, dependence ([place]) [F12.20] 06/06/2016  . MDD (major  depressive disorder), recurrent severe, without psychosis ([place]) [F33.2] 06/03/2016  . Bipolar I disorder, most recent episode depressed ([place]) [F31.30] 07/04/2015  . Overdose [T50.901A] 07/02/2015  . Lactic acidosis [E87.2] 07/02/2015  . Acute respiratory failure ([place]) [J96.00] 07/02/2015  . Sepsis ([place]) [A41.9] 07/02/2015  . AKI (acute kidney injury) ([place]) [N17.9]   . Altered mental status [R41.82]   . Pyrexia [R50.9]   . Abdominal pain, epigastric [R10.13] 06/17/2015  . Hepatitis C antibody test positive [R76.8] 06/17/2015  . Peptic ulcer disease [K27.9] 06/17/2015  . Epigastric pain [R10.13]   . Transaminitis [R74.0] 06/16/2015  . History of substance abuse [Z87.898] 06/16/2015   Total Time spent with patient: 20 minutes  Past Psychiatric History: As in H&P  Past Medical History:  Past Medical History:  Diagnosis Date  . Bipolar 1 disorder ([place])   . Bronchitis   . GAD (generalized anxiety disorder)   . Hepatitis C antibody test positive 06/17/2015  . History of substance abuse 06/16/2015  . OCD (obsessive compulsive disorder)   . Peptic ulcer   . Polysubstance abuse   . PTSD (post-traumatic stress disorder)     Past Surgical History:  Procedure Laterality Date  . OTHER SURGICAL HISTORY     scar tissue removed from right ovary   . OTHER SURGICAL HISTORY     fallopian tube repair  . WISDOM TOOTH EXTRACTION     Family History:  Family History  Problem Relation Age of Onset  . Drug abuse Brother   . Suicidality Brother   . Cancer Other   . Depression Mother   . Drug abuse Mother   . Alcoholism Father   . Bipolar disorder Cousin    Family Psychiatric  History:  As in H&P Social History:  History  Alcohol Use  . Yes    Comment: occ     History  Drug Use  . Types: Marijuana, Methamphetamines, MDMA (Ecstacy), Oxycodone, Benzodiazepines, Cocaine    Social History   Social History  . Marital status: Legally Separated    Spouse name: N/A  . Number of children:  N/A  . Years of education: N/A   Social History Main Topics  . Smoking status: Current Every Day Smoker    Packs/day: 1.00    Years: 10.00    Types: Cigarettes  . Smokeless tobacco: Never Used  . Alcohol use Yes     Comment: occ  . Drug use: Yes    Types: Marijuana, Methamphetamines, MDMA (Ecstacy), Oxycodone, Benzodiazepines, Cocaine  . Sexual activity: Yes    Birth control/ protection: None   Other Topics Concern  . None   Social History Narrative  . None   Additional Social History:      Sleep: Better  Appetite:  Good  Current Medications: Current Facility-Administered Medications  Medication Dose Route Frequency Provider Last Rate Last Dose  . acetaminophen (TYLENOL) tablet 650 mg  650 mg Oral Q6H PRN Okonkwo, Justina A, NP   650 mg at 11/06/16 0756  . alum & mag hydroxide-simeth (MAALOX/MYLANTA) 200-200-20 MG/5ML suspension 30 mL  30 mL Oral Q4H PRN Okonkwo, Justina A, NP   30 mL at 11/06/16 0458  . benztropine (COGENTIN) tablet 0.5 mg  0.5 mg Oral BID Ursula Alert, MD   0.5 mg at 11/06/16 0744  . benztropine (COGENTIN) tablet 0.5 mg  0.5 mg Oral BID PRN Ursula Alert, MD   0.5 mg at 11/06/16 0047   Or  . benztropine mesylate (COGENTIN) injection 0.5 mg  0.5 mg Intramuscular BID PRN Ursula Alert, MD      . Derrill Memo ON 11/07/2016] cloNIDine (CATAPRES) tablet 0.1 mg  0.1 mg Oral QAC breakfast Eappen, Saramma, MD      . clotrimazole (GYNE-LOTRIMIN) vaginal cream 1 Applicatorful  1 Applicatorful Vaginal QHS Izediuno, Laruth Bouchard, MD   1 Applicatorful at 85/27/78 2210  . dicyclomine (BENTYL) tablet 20 mg  20 mg Oral Q6H PRN Ursula Alert, MD   20 mg at 11/06/16 0458  . gabapentin (NEURONTIN) capsule 600 mg  600 mg Oral TID Lu Duffel, Justina A, NP   600 mg at 11/06/16 0744  . levothyroxine (SYNTHROID, LEVOTHROID) tablet 50 mcg  50 mcg Oral QAC breakfast Okonkwo, Justina A, NP   50 mcg at 11/06/16 0604  . loperamide (IMODIUM) capsule 2-4 mg  2-4 mg Oral PRN Ursula Alert, MD   2 mg at 11/03/16 2202  . magnesium hydroxide (MILK OF MAGNESIA) suspension 30 mL  30 mL Oral Daily PRN Okonkwo, Justina A, NP      . methocarbamol (ROBAXIN) tablet 500 mg  500 mg Oral Q8H PRN Ursula Alert, MD   500 mg at 11/06/16 0604  . mirtazapine (REMERON) tablet 45 mg  45 mg Oral QHS Okonkwo, Justina A, NP   45 mg at 11/05/16 2115  . multivitamin with minerals tablet 1 tablet  1 tablet Oral Daily Ursula Alert, MD   1 tablet at 11/06/16 0745  . nicotine polacrilex (NICORETTE) gum 2 mg  2 mg Oral PRN Patriciaann Clan E, PA-C   2 mg at 11/06/16 1100  . ondansetron (ZOFRAN-ODT) disintegrating tablet 4 mg  4 mg Oral Q6H PRN Eappen, Saramma, MD      . prazosin (MINIPRESS) capsule 2 mg  2 mg Oral QHS Okonkwo, Justina A, NP   2 mg at 11/05/16 2114  . propranolol (INDERAL) tablet 20 mg  20 mg Oral TID Artist Beach, MD   20 mg at 11/06/16 0745  . QUEtiapine (SEROQUEL) tablet 400 mg  400 mg Oral QHS Izediuno, Laruth Bouchard, MD   400 mg at 11/05/16 2114  . thiamine (B-1) injection 100 mg  100 mg Intramuscular Once Eappen, Saramma, MD      . thiamine (VITAMIN B-1) tablet 100 mg  100 mg Oral Daily Eappen, Saramma, MD   100 mg at 11/06/16 0745    Lab Results:  Results for orders placed or performed during the hospital encounter of 11/02/16 (from the past 48 hour(s))  CK     Status: None   Collection Time: 11/04/16  6:27 PM  Result Value Ref Range   Total CK 151 38 - 234 U/L    Comment: Performed at [facility], [place] [address], [place], [place] [address]  Comprehensive metabolic panel     Status: Abnormal   Collection Time: 11/04/16  6:27 PM  Result Value Ref Range   Sodium 138 135 - 145 mmol/L   Potassium 3.6 3.5 - 5.1 mmol/L   Chloride 104 101 - 111 mmol/L   CO2 26 22 - 32 mmol/L   Glucose, Bld 93 65 - 99 mg/dL   BUN 15 6 - 20 mg/dL   Creatinine, Ser 0.82 0.44 - 1.00 mg/dL   Calcium 8.7 (L) 8.9 - 10.3 mg/dL   Total Protein 7.6 6.5 - 8.1 g/dL   Albumin  4.0 3.5 - 5.0 g/dL   AST 37 15 - 41 U/L   ALT 61 (H) 14 - 54 U/L   Alkaline Phosphatase 97 38 - 126 U/L   Total Bilirubin 0.4 0.3 - 1.2 mg/dL   GFR calc non Af Amer >60 >60 mL/min   GFR calc Af Amer >60 >60 mL/min    Comment: (NOTE) The eGFR has been calculated using the CKD EPI equation. This calculation has not been validated in all clinical situations. eGFR's persistently <60 mL/min signify possible Chronic Kidney Disease.    Anion gap 8 5 - 15    Comment: Performed at [facility], [place] [address], [place], [place] [address]  CBC with Differential/Platelet     Status: Abnormal   Collection Time: 11/04/16  6:27 PM  Result Value Ref Range   WBC 10.8 (H) 4.0 - 10.5 K/uL   RBC 3.90 3.87 - 5.11 MIL/uL   Hemoglobin 11.7 (L) 12.0 - 15.0 g/dL   HCT 34.7 (L) 36.0 - 46.0 %   MCV 89.0 78.0 - 100.0 fL   MCH 30.0 26.0 - 34.0 pg   MCHC 33.7 30.0 - 36.0 g/dL   RDW 14.5 11.5 - 15.5 %   Platelets 356 150 - 400 K/uL   Neutrophils Relative % 42 %   Neutro Abs 4.5 1.7 - 7.7 K/uL   Lymphocytes Relative 47 %   Lymphs Abs 5.0 (H) 0.7 - 4.0 K/uL   Monocytes Relative 10 %   Monocytes Absolute 1.0 0.1 - 1.0 K/uL   Eosinophils Relative 2 %   Eosinophils Absolute 0.2 0.0 - 0.7 K/uL   Basophils Relative 1 %   Basophils Absolute 0.1 0.0 - 0.1 K/uL    Comment: Performed at [facility], [place] [address] [place], [place] [address]    Blood Alcohol level:  Lab Results  Component Value Date  ETH <5 10/29/2016   ETH <5 18/29/9371    Metabolic Disorder Labs: Lab Results  Component Value Date   HGBA1C 5.3 10/16/2016   MPG 105 10/16/2016   MPG 94 06/07/2016   Lab Results  Component Value Date   PROLACTIN 8.8 10/16/2016   PROLACTIN 49.9 (H) 06/07/2016   Lab Results  Component Value Date   CHOL 207 (H) 10/16/2016   TRIG 140 10/16/2016   HDL 104 10/16/2016   CHOLHDL 2.0 10/16/2016   VLDL 28 10/16/2016   LDLCALC 75 10/16/2016   LDLCALC 76 06/07/2016     Physical Findings: AIMS: Facial and Oral Movements Muscles of Facial Expression: None, normal Lips and Perioral Area: None, normal Jaw: None, normal Tongue: None, normal,Extremity Movements Upper (arms, wrists, hands, fingers): None, normal Lower (legs, knees, ankles, toes): None, normal, Trunk Movements Neck, shoulders, hips: None, normal, Overall Severity Severity of abnormal movements (highest score from questions above): None, normal Incapacitation due to abnormal movements: None, normal Patient's awareness of abnormal movements (rate only patient's report): No Awareness, Dental Status Current problems with teeth and/or dentures?: No Does patient usually wear dentures?: No  CIWA:  CIWA-Ar Total: 1 COWS:  COWS Total Score: 2  Musculoskeletal: Strength & Muscle Tone: cogwheel Gait & Station: normal Patient leans: N/A  Psychiatric Specialty Exam: Physical Exam  Constitutional: She is oriented to person, place, and time. She appears well-developed and well-nourished.  HENT:  Head: Normocephalic.  Eyes: Conjunctivae are normal. Pupils are equal, round, and reactive to light.  Neck: Normal range of motion.  Cardiovascular: Normal rate and regular rhythm.   Respiratory: Effort normal.  Neurological: She is alert and oriented to person, place, and time.  Psychiatric:  As above    ROS  Blood pressure 117/65, pulse 99, temperature 98.6 F (37 C), temperature source Oral, resp. rate 16, height '5\' 3"'  (1.6 m), weight 69.4 kg (153 lb), SpO2 99 %.Body mass index is 27.1 kg/m.  General Appearance: Neatly dressed, somewhat perplexed. Was praying with her roommate just prior to interview. Mild tremors. No stiffness. No restlessness.   Eye Contact:  Good but tends to stare  Speech:  Spontaneous. Not pressured or loud.   Volume:  Normal  Mood: Slightly overwhelmed by her subjective experience.   Affect:  Restricted.  Thought Process:  Normal speed of thought. Linear   Orientation:   Oriented to person, place and time  Thought Content:  Delusional atmosphere. No thoughts of violence. No hallucination in any modality.   Suicidal Thoughts:  No  Homicidal Thoughts:  No  Memory:  Impaired   Judgement:  Fair  Insight:  Partial  Psychomotor Activity:  Normal   Concentration:  Fair  Recall:  Good  Fund of Knowledge:  Good  Language:  Good  Akathisia:  Resolving   Handed:    AIMS (if indicated):     Assets:  Intimacy Social Support  ADL's:  Intact  Cognition:  Normal  Sleep:  Number of Hours: 5     Treatment Plan Summary: Patient is no longer in withdrawals. She is no longer having akathisia. She is tolerating recent medication adjustment well. We plan to adjust her antipsychotic agent further today.   Psychiatric: SUD Substance Induced Psychotic Disorder Akathisia   Medical: HCV Migraine  Psychosocial:  Loss of custody of her child  PLAN: 1. Increase PM Seroquel to 600 mg 2. Discontinue Ativan  3. Continue to monitor mood ,behavior and interaction with peers 4. Continue to encourage unit groups and  activities.   Artist Beach, MD 11/06/2016, 11:28 AMPatient ID: Becky [name], female   DOB: 07-10-1990, 26 y.o.   MRN: [id number] Patient ID: Becky [name], female   DOB: February 12, 1991, 26 y.o.   MRN: [id number]

## 2016-11-06 NOTE — [facility] Group Notes (Signed)
[facility] LCSW Group Therapy  11/06/2016 10 AM  Type of Therapy:  Group Therapy  Participation Level:  Did Not Attend. Patient remained in bed despite overhead announcement and CSW encouragement.    Summary of Progress/Problems: Topic for today was thoughts and feelings regarding discharge. We discussed fears of upcoming changes including judgements, expectations and stigma of mental health issues. We then discussed supports: what constitutes a supportive framework, identification of supports and what to do when others are not supportive. Patients then discussed different options to deal with anger.   Carney Bernatherine C Marijayne Rauth, LCSW

## 2016-11-06 NOTE — Progress Notes (Signed)
D: Patient visible on unit. Active and intrusive. Reports A/V hallucination in the morning but denies at this time. Denies pain, SI/HI. Endorses mild depression and anxiety of 3/10. Patient stated Becky [name] is improving overall. Needs multiple redirection this evening over intrusion.  A: Staff offered support and encouragement as needed. Due med given as ordered. Routine safety checks maintained. Will continue to monitor patient.  R: Patient remains safe on unit.

## 2016-11-06 NOTE — B[facility] Group Notes (Signed)
The focus of this group is to educate the patient on the purpose and policies of crisis stabilization and provide a format to answer questions about their admission.  The group details unit policies and expectations of patients while admitted.  Patient did not attend 0900 nurse education orientation group this morning.  Patient stayed in bed.   

## 2016-11-06 NOTE — Plan of Care (Signed)
Problem: Coping: Goal: Ability to cope will improve Outcome: Progressing Nurse discussed anxiety/anger/coping skills with patient.

## 2016-11-06 NOTE — Progress Notes (Signed)
Patient did attend the evening speaker AA meeting.  

## 2016-11-06 NOTE — Progress Notes (Signed)
D- Patient has complaints of being "emotionally unstable" this shift.  Patient was observed crying earlier in shift.  Patient verbalizes that she has been off of her medications for awhile, prior to admission, and would like to be back on mood stabilizers.  Patient frequently requested medications for verbalized symptoms.  Patient increasingly agitated during shift when she was notified that a particular medication was not due to be given and another medication was discontinued. Patient began yelling and using profanity towards staff.  Patient currently denies SI/HI/AVH.     A- Scheduled and PRN medications administered to patient, per MD orders. Support and encouragement provided.  Routine safety checks conducted every 15 minutes.  Patient informed to notify staff with problems or concerns. R- Patient contracts for safety at this time. Patient remains safe at this time.

## 2016-11-06 NOTE — Progress Notes (Signed)
D:  Patient's self inventory sheet, patient she has poor sleep, sleep medication given.  Good appetite, hyper energy level, (akathisia), poor concentration.  Rated depression 8, hopeless 6, anxiety 10.  Withdrawals, tremors, diarrhea, cravings, agitation, irritability.  Denied SI.  Denied physical problems.  Denied physical pain.  Goal is getting her meds right.  Plans to talk to nurse and MD.  "I'm not sleeping because of akathisia and my nerves."  Does have discharge plans. A:  Medications administered per MD orders.  Emotional support and encouragement given patient. R:   Safety maintained with 15 minute checks. Patient took morning medications, went to bed and slept until approximately 1100.  Patient got up and asked for medications again.  Patient has continued to ask for anxiety medication, ativan.  Patient very anxious, irritable at medication window.  Continues to say she will talk to MD about medications.

## 2016-11-07 NOTE — [facility] Group Notes (Signed)
[facility] LCSW Group Therapy  11/07/2016 1:03 PM  Type of Therapy:  Group Therapy  Participation Level:  Did Not Attend-pt invited. Chose to remain in bed.    Summary of Progress/Problems: Today's Topic: Overcoming Obstacles. Patients identified one short term goal and potential obstacles in reaching this goal. Patients processed barriers involved in overcoming these obstacles. Patients identified steps necessary for overcoming these obstacles and explored motivation (internal and external) for facing these difficulties head on.   Becky [name] N Smart LCSW 11/07/2016, 1:03 PM

## 2016-11-07 NOTE — Progress Notes (Signed)
D:  Patient's self inventory sheet, patient has poor sleep, sleep medication not helpful.  Good appetite, normal energy level, good concentration.  Rated depression 5, hopeless 4, anxiety 9.  Withdrawals, diarrhea, cravings, cramping, agitation.  Denied SI.  Denied physical problems.  Physical pain, ovaries.  Goal is meds.  Plans to talk to MD.  Does have discharge plans. A:  Medications administered per MD orders.  Emotional support and encouragement given patient. R:  Denied SI and HI, contracts for safety.  Safety maintained with 15 minute checks.

## 2016-11-07 NOTE — Progress Notes (Signed)
Recreation Therapy Notes  Date: 11/07/16 Time: 0930 Location: [address] Dayroom  Group Topic: Stress Management  Goal Area(s) Addresses:  Patient will verbalize importance of using healthy stress management.  Patient will identify positive emotions associated with healthy stress management.   Behavioral Response: Engaged  Intervention: Stress Management  Activity :  [place].  LRT introduced the stress management technique of guided imagery.  LRT read a script to allow patients the opportunity for take a mental journey to the beach.  Patients were to follow along as LRT read script to engage in the technique.  Education:  Stress Management, Discharge Planning.   Education Outcome: Acknowledges edcuation/In group clarification offered/Needs additional education  Clinical Observations/Feedback: Pt attended group.   Khalaya Mcgurn Lillia AbedLIndsay, LRT/CTRS         Lillia AbedLindsay, Aphrodite Harpenau A 11/07/2016 12:14 PM

## 2016-11-07 NOTE — Plan of Care (Signed)
Problem: Education: Goal: Knowledge of [facility] Education information/materials will improve Outcome: Progressing Nurse discussed anxiety/depression/coping skills with patient.

## 2016-11-07 NOTE — Progress Notes (Signed)
[facility] MD Progress Note  11/07/2016 1:38 PM Becky [name]  MRN:  [id number] Subjective:   26 yo Caucasian female, single, unemployed. Has a year old child who is in foster care. Background history of Bipolar Disorder and SUD. Patient was dropped off by her boyfriend. She had called 911 initially. Reported to be internally stimulated. She was hearing voices at home. At the ER patient was observed to be floridly psychotic. She required multiple doses of emergency medications. Her UDS was positive for cocaine, THC, opiates and benzodiazepines.   Chart reviewed today. Patient discussed at team  Staff reports that she was intrusive and required multiple redirections. She complained of hearing voices yesterday. She requests for PRN'S round the clock. She had been seeking benzodiazepines since we weaned her off them. She was up most of the night. Sleep remains an issue at night  Seen today. Patient is very focused on being discharged. Feels being here has not been helpful. Says she has not heard any voices since yesterday morning. Feels she has come off drugs. Shared that she swallowed a bag of cocaine at home. She was trying to conceal it from her mom. Reports use of drugs since the age of 12 years. Not invested in going into inpatient treatment. Not processing ways of staying sober. Feels once she is at home she would be alright.   Principal Problem: Bipolar disorder, curr episode mixed, severe, with psychotic features (HCC) Diagnosis:   Patient Active Problem List   Diagnosis Date Noted  . Bipolar disorder, curr episode mixed, severe, with psychotic features (HCC) [F31.64] 11/02/2016  . PTSD (post-traumatic stress disorder) [F43.10] 11/02/2016  . GAD (generalized anxiety disorder) [F41.1] 10/16/2016  . Opioid use disorder, moderate, dependence (HCC) [F11.20] 06/06/2016  . Sedative, hypnotic or anxiolytic use disorder, severe, dependence (HCC) [F13.20] 06/06/2016  . Cocaine use disorder, severe,  dependence (HCC) [F14.20] 06/06/2016  . Cannabis use disorder, severe, dependence (HCC) [F12.20] 06/06/2016  . MDD (major depressive disorder), recurrent severe, without psychosis (HCC) [F33.2] 06/03/2016  . Bipolar I disorder, most recent episode depressed (HCC) [F31.30] 07/04/2015  . Overdose [T50.901A] 07/02/2015  . Lactic acidosis [E87.2] 07/02/2015  . Acute respiratory failure (HCC) [J96.00] 07/02/2015  . Sepsis (HCC) [A41.9] 07/02/2015  . AKI (acute kidney injury) (HCC) [N17.9]   . Altered mental status [R41.82]   . Pyrexia [R50.9]   . Abdominal pain, epigastric [R10.13] 06/17/2015  . Hepatitis C antibody test positive [R76.8] 06/17/2015  . Peptic ulcer disease [K27.9] 06/17/2015  . Epigastric pain [R10.13]   . Transaminitis [R74.0] 06/16/2015  . History of substance abuse [Z87.898] 06/16/2015   Total Time spent with patient: 20 minutes  Past Psychiatric History: As in H&P  Past Medical History:  Past Medical History:  Diagnosis Date  . Bipolar 1 disorder (HCC)   . Bronchitis   . GAD (generalized anxiety disorder)   . Hepatitis C antibody test positive 06/17/2015  . History of substance abuse 06/16/2015  . OCD (obsessive compulsive disorder)   . Peptic ulcer   . Polysubstance abuse   . PTSD (post-traumatic stress disorder)     Past Surgical History:  Procedure Laterality Date  . OTHER SURGICAL HISTORY     scar tissue removed from right ovary   . OTHER SURGICAL HISTORY     fallopian tube repair  . WISDOM TOOTH EXTRACTION     Family History:  Family History  Problem Relation Age of Onset  . Drug abuse Brother   . Suicidality Brother   .  Cancer Other   . Depression Mother   . Drug abuse Mother   . Alcoholism Father   . Bipolar disorder Cousin    Family Psychiatric  History: As in H&P Social History:  History  Alcohol Use  . Yes    Comment: occ     History  Drug Use  . Types: Marijuana, Methamphetamines, MDMA (Ecstacy), Oxycodone, Benzodiazepines, Cocaine     Social History   Social History  . Marital status: Legally Separated    Spouse name: N/A  . Number of children: N/A  . Years of education: N/A   Social History Main Topics  . Smoking status: Current Every Day Smoker    Packs/day: 1.00    Years: 10.00    Types: Cigarettes  . Smokeless tobacco: Never Used  . Alcohol use Yes     Comment: occ  . Drug use: Yes    Types: Marijuana, Methamphetamines, MDMA (Ecstacy), Oxycodone, Benzodiazepines, Cocaine  . Sexual activity: Yes    Birth control/ protection: None   Other Topics Concern  . None   Social History Narrative  . None   Additional Social History:      Sleep: Better  Appetite:  Good  Current Medications: Current Facility-Administered Medications  Medication Dose Route Frequency Provider Last Rate Last Dose  . acetaminophen (TYLENOL) tablet 650 mg  650 mg Oral Q6H PRN Okonkwo, Justina A, NP   650 mg at 11/07/16 1040  . alum & mag hydroxide-simeth (MAALOX/MYLANTA) 200-200-20 MG/5ML suspension 30 mL  30 mL Oral Q4H PRN Okonkwo, Justina A, NP   30 mL at 11/07/16 0458  . benztropine (COGENTIN) tablet 0.5 mg  0.5 mg Oral BID Jomarie Longs, MD   0.5 mg at 11/07/16 0742  . benztropine (COGENTIN) tablet 0.5 mg  0.5 mg Oral BID PRN Jomarie Longs, MD   0.5 mg at 11/07/16 0033   Or  . benztropine mesylate (COGENTIN) injection 0.5 mg  0.5 mg Intramuscular BID PRN Eappen, Levin Bacon, MD      . cloNIDine (CATAPRES) tablet 0.1 mg  0.1 mg Oral QAC breakfast Eappen, Saramma, MD   0.1 mg at 11/07/16 1610  . clotrimazole (GYNE-LOTRIMIN) vaginal cream 1 Applicatorful  1 Applicatorful Vaginal QHS Izediuno, Delight Ovens, MD   1 Applicatorful at 11/06/16 2127  . gabapentin (NEURONTIN) capsule 600 mg  600 mg Oral TID Beryle Lathe, Justina A, NP   600 mg at 11/07/16 1207  . levothyroxine (SYNTHROID, LEVOTHROID) tablet 50 mcg  50 mcg Oral QAC breakfast Okonkwo, Justina A, NP   50 mcg at 11/07/16 9604  . magnesium hydroxide (MILK OF MAGNESIA)  suspension 30 mL  30 mL Oral Daily PRN Okonkwo, Justina A, NP      . mirtazapine (REMERON) tablet 45 mg  45 mg Oral QHS Okonkwo, Justina A, NP   45 mg at 11/06/16 2114  . multivitamin with minerals tablet 1 tablet  1 tablet Oral Daily Jomarie Longs, MD   1 tablet at 11/07/16 0743  . nicotine polacrilex (NICORETTE) gum 2 mg  2 mg Oral PRN Donell Sievert E, PA-C   2 mg at 11/07/16 1257  . prazosin (MINIPRESS) capsule 2 mg  2 mg Oral QHS Okonkwo, Justina A, NP   2 mg at 11/06/16 2114  . propranolol (INDERAL) tablet 20 mg  20 mg Oral TID Georgiann Cocker, MD   20 mg at 11/07/16 1207  . QUEtiapine (SEROQUEL) tablet 600 mg  600 mg Oral QHS Izediuno, Delight Ovens, MD   600  mg at 11/06/16 2114  . thiamine (VITAMIN B-1) tablet 100 mg  100 mg Oral Daily Eappen, Saramma, MD   100 mg at 11/07/16 0744    Lab Results:  No results found for this or any previous visit (from the past 48 hour(s)).  Blood Alcohol level:  Lab Results  Component Value Date   ETH <5 10/29/2016   ETH <5 10/07/2016    Metabolic Disorder Labs: Lab Results  Component Value Date   HGBA1C 5.3 10/16/2016   MPG 105 10/16/2016   MPG 94 06/07/2016   Lab Results  Component Value Date   PROLACTIN 8.8 10/16/2016   PROLACTIN 49.9 (H) 06/07/2016   Lab Results  Component Value Date   CHOL 207 (H) 10/16/2016   TRIG 140 10/16/2016   HDL 104 10/16/2016   CHOLHDL 2.0 10/16/2016   VLDL 28 10/16/2016   LDLCALC 75 10/16/2016   LDLCALC 76 06/07/2016    Physical Findings: AIMS: Facial and Oral Movements Muscles of Facial Expression: None, normal Lips and Perioral Area: None, normal Jaw: None, normal Tongue: None, normal,Extremity Movements Upper (arms, wrists, hands, fingers): None, normal Lower (legs, knees, ankles, toes): None, normal, Trunk Movements Neck, shoulders, hips: None, normal, Overall Severity Severity of abnormal movements (highest score from questions above): None, normal Incapacitation due to abnormal  movements: None, normal Patient's awareness of abnormal movements (rate only patient's report): No Awareness, Dental Status Current problems with teeth and/or dentures?: No Does patient usually wear dentures?: No  CIWA:  CIWA-Ar Total: 3 COWS:  COWS Total Score: 2  Musculoskeletal: Strength & Muscle Tone: cogwheel Gait & Station: normal Patient leans: N/A  Psychiatric Specialty Exam: Physical Exam  Constitutional: She is oriented to person, place, and time. She appears well-developed and well-nourished.  HENT:  Head: Normocephalic.  Eyes: Conjunctivae are normal. Pupils are equal, round, and reactive to light.  Neck: Normal range of motion.  Cardiovascular: Normal rate and regular rhythm.   Respiratory: Effort normal.  Neurological: She is alert and oriented to person, place, and time.  Psychiatric:  As above    ROS  Blood pressure (!) 165/63, pulse 96, temperature 97.7 F (36.5 C), temperature source Oral, resp. rate 16, height 5\' 3"  (1.6 m), weight 69.4 kg (153 lb), SpO2 99 %.Body mass index is 27.1 kg/m.  General Appearance: Well groomed. Does not look spaced out today. Pleasant but became irritable when she realized she was not being discharged today. Not internally stimulated.   Eye Contact:  Good   Speech:  Spontaneous. Normal prosody  Volume:  Normal  Mood: still has underlying irritability.   Affect:  Restricted and mood congruent.  Thought Process:  Normal speed of thought. Linear   Orientation:  Oriented to person, place and time  Thought Content:  Focused on going home. No hallucination . No thoughts of violence. No delusional theme.    Suicidal Thoughts:  No  Homicidal Thoughts:  No  Memory:  Unable to assess at this time.    Judgement:  Poor  Insight:  Partial  Psychomotor Activity:  Normal   Concentration:  Fair  Recall:  Good  Fund of Knowledge:  Good  Language:  Good  Akathisia:  Negative  Handed:    AIMS (if indicated):     Assets:  Intimacy Social  Support  ADL's:  Intact  Cognition:  Normal  Sleep:  Number of Hours: 4     Treatment Plan Summary: Patient presented with substance induced psychosis. She was disruptive and required  a lot of PRN. She developed Akathisia . We weaned her off Haldol and switched her to Seroquel. We managed Akathisias with propranolol and brief dose of Ativan. This complication has resolved. She is less psychotic. We increased Seroquel last night. She would need further evaluation.   Psychiatric: SUD Substance Induced Psychotic Disorder Akathisia   Medical: HCV Migraine  Psychosocial:  Loss of custody of her child  PLAN: 1. Continue current regimen 2. Continue to monitor mood ,behavior and interaction with peers 3. Continue to encourage unit groups and activities.  4. Hopeful discharge later in the week.   Georgiann CockerVincent A Izediuno, MD 11/07/2016, 1:38 PMPatient ID: Becky [name], female   DOB: 10/01/1990, 26 y.o.   MRN: [id number] Patient ID: Becky [name], female   DOB: 02/08/1991, 26 y.o.   MRN: [id number] Patient ID: Becky [name], female   DOB: 07/18/1990, 26 y.o.   MRN: [id number]

## 2016-11-08 MED ORDER — NICOTINE POLACRILEX 2 MG MT GUM: 2.0000 mg | CHEWING_GUM | OROMUCOSAL | 0 refills | Status: DC | PRN

## 2016-11-08 MED ORDER — QUETIAPINE FUMARATE 300 MG PO TABS: 600.0000 mg | ORAL_TABLET | Freq: Every day | ORAL | 0 refills | Status: DC

## 2016-11-08 MED ORDER — MIRTAZAPINE 45 MG PO TABS: 45.0000 mg | ORAL_TABLET | Freq: Every day | ORAL | 0 refills | Status: DC

## 2016-11-08 MED ORDER — BENZTROPINE MESYLATE 0.5 MG PO TABS: 0.5000 mg | ORAL_TABLET | Freq: Every day | ORAL | 0 refills | Status: DC

## 2016-11-08 MED ORDER — CLOTRIMAZOLE 1 % VA CREA: 1.0000 | TOPICAL_CREAM | Freq: Every day | VAGINAL | 0 refills | Status: DC

## 2016-11-08 MED ORDER — PROPRANOLOL HCL 20 MG PO TABS: 20.0000 mg | ORAL_TABLET | Freq: Three times a day (TID) | ORAL | 0 refills | Status: DC

## 2016-11-08 MED ORDER — PRAZOSIN HCL 2 MG PO CAPS: 2.0000 mg | ORAL_CAPSULE | Freq: Every day | ORAL | 0 refills | Status: DC

## 2016-11-08 MED ORDER — GABAPENTIN 300 MG PO CAPS: 600.0000 mg | ORAL_CAPSULE | Freq: Three times a day (TID) | ORAL | 0 refills | Status: DC

## 2016-11-08 MED ORDER — HYDROXYZINE HCL 25 MG PO TABS: ORAL_TABLET | ORAL | 0 refills | Status: DC

## 2016-11-08 MED ORDER — BENZTROPINE MESYLATE 0.5 MG PO TABS
0.5000 mg | ORAL_TABLET | Freq: Every day | ORAL | Status: DC
  Filled 2016-11-08: qty 7

## 2016-11-08 MED ORDER — HYDROXYZINE HCL 25 MG PO TABS
25.0000 mg | ORAL_TABLET | Freq: Three times a day (TID) | ORAL | Status: DC | PRN
  Filled 2016-11-08: qty 9

## 2016-11-08 MED ORDER — LEVOTHYROXINE SODIUM 50 MCG PO TABS: 50.0000 ug | ORAL_TABLET | Freq: Every day | ORAL | 0 refills | Status: DC

## 2016-11-08 NOTE — Progress Notes (Signed)
DAR Note: Becky HeckDanielle has been up and visible on the unit.  She did not attend group this evening.  She was noted sitting in the hallway.  She asked for various prns that were no longer ordered.  She did get nicotine gum prn and was effective.  She denied SI/HI or A/V hallucinations.  She denied any pain or discomfort and appeared to be in no physical distress.  Q 15 checks maintained for safety.  1:1 given for support and encouragement.  We will continue to monitor the progress towards her goals.

## 2016-11-08 NOTE — Discharge Summary (Signed)
Physician Discharge Summary Note  Patient:  Becky [name] is an 26 y.o., female MRN:  [id number] DOB:  Dec 15, 1990 Patient phone:  [phone] (home)  Patient address:   [address] [place] [address],  Total Time spent with patient: Greater than 30 minutes  Date of Admission:  11/02/2016 Date of Discharge: 11/08/2016   Reason for Admission: Heroin Overdose & Erratic behavior.    Principal Problem: Bipolar disorder, curr episode mixed, severe, with psychotic features [facility])  Discharge Diagnoses: Patient Active Problem List   Diagnosis Date Noted  . Bipolar disorder, curr episode mixed, severe, with psychotic features (HCC) [F31.64] 11/02/2016  . PTSD (post-traumatic stress disorder) [F43.10] 11/02/2016  . GAD (generalized anxiety disorder) [F41.1] 10/16/2016  . Opioid use disorder, moderate, dependence (HCC) [F11.20] 06/06/2016  . Sedative, hypnotic or anxiolytic use disorder, severe, dependence (HCC) [F13.20] 06/06/2016  . Cocaine use disorder, severe, dependence (HCC) [F14.20] 06/06/2016  . Cannabis use disorder, severe, dependence (HCC) [F12.20] 06/06/2016  . MDD (major depressive disorder), recurrent severe, without psychosis (HCC) [F33.2] 06/03/2016  . Bipolar I disorder, most recent episode depressed (HCC) [F31.30] 07/04/2015  . Overdose [T50.901A] 07/02/2015  . Lactic acidosis [E87.2] 07/02/2015  . Acute respiratory failure (HCC) [J96.00] 07/02/2015  . Sepsis (HCC) [A41.9] 07/02/2015  . AKI (acute kidney injury) (HCC) [N17.9]   . Altered mental status [R41.82]   . Pyrexia [R50.9]   . Abdominal pain, epigastric [R10.13] 06/17/2015  . Hepatitis C antibody test positive [R76.8] 06/17/2015  . Peptic ulcer disease [K27.9] 06/17/2015  . Epigastric pain [R10.13]   . Transaminitis [R74.0] 06/16/2015  . History of substance abuse [Z87.898] 06/16/2015   Past Psychiatric History: GAD, Cocaine use disorder, Cannabis use disorder, BP  Past Medical History:  Past Medical  History:  Diagnosis Date  . Bipolar 1 disorder (HCC)   . Bronchitis   . GAD (generalized anxiety disorder)   . Hepatitis C antibody test positive 06/17/2015  . History of substance abuse 06/16/2015  . OCD (obsessive compulsive disorder)   . Peptic ulcer   . Polysubstance abuse   . PTSD (post-traumatic stress disorder)     Past Surgical History:  Procedure Laterality Date  . OTHER SURGICAL HISTORY     scar tissue removed from right ovary   . OTHER SURGICAL HISTORY     fallopian tube repair  . WISDOM TOOTH EXTRACTION     Family History:  Family History  Problem Relation Age of Onset  . Drug abuse Brother   . Suicidality Brother   . Cancer Other   . Depression Mother   . Drug abuse Mother   . Alcoholism Father   . Bipolar disorder Cousin    Family Psychiatric  History: See H&P  Social History:  History  Alcohol Use  . Yes    Comment: occ     History  Drug Use  . Types: Marijuana, Methamphetamines, MDMA (Ecstacy), Oxycodone, Benzodiazepines, Cocaine    Social History   Social History  . Marital status: Legally Separated    Spouse name: N/A  . Number of children: N/A  . Years of education: N/A   Social History Main Topics  . Smoking status: Current Every Day Smoker    Packs/day: 1.00    Years: 10.00    Types: Cigarettes  . Smokeless tobacco: Never Used  . Alcohol use Yes     Comment: occ  . Drug use: Yes    Types: Marijuana, Methamphetamines, MDMA (Ecstacy), Oxycodone, Benzodiazepines, Cocaine  . Sexual activity:  Yes    Birth control/ protection: None   Other Topics Concern  . None   Social History Narrative  . None   Hospital Course: Becky [name] is a [age] y old C, who is single, lives in [place], [place], has a hx of Bipolar do, PTSD, polysubstance abuse, presented IVC ed for worsening psychosis and agitation. Patient  Pt today seen in the hallway having panic attacks, tremors , agitation, pt likely in withdrawal from all her polysubstance abuse prior to  admission. Pt on evaluation appeared to be paranoid, was seen as looking around, reported not feeling safe. Pt seen as unable to lie down for longer intervals. Pt hence required PRN medications like ativan, Vistaril to calm her down. Pt reported severe substance abuse -opioid (heroin, Roxycodone, Suboxone) since the age of 26. Pt also reports smoking Cannabis heavily, Valium/xanax heavily. Pt also reports using cocaine on a regular basis.  Becky [name] was discharged from this hospital on 10-18-16 after receiving detoxification treatment for polysubstance use/intoxications. Upon discharge, she was recommended to continue further substance abuse treatment at the [facility] in [place], [place]. And for the mental health care/medication management, to receive these services at the [facility] in [place], [place]. However, Becky [name] was [facility]ad[facility] with her UDS positive for Benzodiazepine, Opioid, Cocaine & THC. She apparently relapsed soon after discharge. She was again in need of detoxification treatments. She received Clonidine detox protocols for opioid detoxification treatment & a brief Ativan detoxification regimen for the Benzodiazepine detox.   Becky [name] was also medicated & discharged on; Hydroxyzine 25 mg prn for anxiety, Mirtazapine 45 mg for depression/sleep, Nicorette gum for smoking cessation, Prazosin 2 mg for nightmares, Seroquel 600 mg for mood control, Cogentin 0.5 mg for EPS, Propranolol 20 mg for agitation & Gabapentin 300 mg for agiation. She was enrolled in the group counseling sessions being offered & held on this unit. She learned coping skills. She received other pertinent medications for the other medical issues presented. She tolerated her treatment regimen without any adverse effects or reactions reported.  Becky [name] has completed detoxification treatment & her mood is stable. She is seen today by her attending psychiatrist for discharge. She has normal anxiety about going  home. She is not overwhelmed by this. She is looking forward to working on her addiction. Not expressing any delusions today. No hallucination. Feels in control of herself. No passivity of thought. No passivity of will. No fantasy about suicide lately. No suicidal thoughts. No thoughts of violence. No craving for drugs. Does not feel depressed. No evidence of mania.  Nursing staff reports that patient has been appropriate on the unit. Patient has been interacting well with peers. No behavioral issues. Patient has not voiced any suicidal thoughts. Patient has not been observed to be internally stimulated or preoccupied. Patient has been adherent with treatment recommendations. Patient has been tolerating her medication well. No reported adverse effects or reactions.   Patient was discussed at the treatment team meeting this morning. Team members feels that patient is back to her baseline level of function. Team agrees with plan to discharge patient today to continue mental health care on an outpatient basis as noted below. Becky [name] was provided with a 7 days worth, supply samples of her [facility]. [facility] in no apparent distress. Transportation her arrangement (Boyfriend).    Physical Findings: AIMS: Facial and Oral Movements Muscles of Facial Expression: None, normal Lips and Perioral Area: None, normal Jaw: None, normal Tongue:  None, normal,Extremity Movements Upper (arms, wrists, hands, fingers): None, normal Lower (legs, knees, ankles, toes): None, normal, Trunk Movements Neck, shoulders, hips: None, normal, Overall Severity Severity of abnormal movements (highest score from questions above): None, normal Incapacitation due to abnormal movements: None, normal Patient's awareness of abnormal movements (rate only patient's report): No Awareness, Dental Status Current problems with teeth and/or dentures?: No Does patient usually wear dentures?:  No  CIWA:  CIWA-Ar Total: 1 COWS:  COWS Total Score: 2  Musculoskeletal: Strength & Muscle Tone: within normal limits Gait & Station: normal Patient leans: N/A  Psychiatric Specialty Exam: Physical Exam  Nursing note and vitals reviewed. Constitutional: She is oriented to person, place, and time. She appears well-developed.  HENT:  Head: Normocephalic.  Eyes: Pupils are equal, round, and reactive to light.  Neck: Normal range of motion.  Cardiovascular: Normal rate.   Respiratory: Effort normal.  GI: Soft.  Genitourinary:  Genitourinary Comments: Deferred  Musculoskeletal: Normal range of motion.  Neurological: She is alert and oriented to person, place, and time.  Skin: Skin is warm and dry.  Psychiatric: She has a normal mood and affect. Her speech is normal and behavior is normal. Thought content normal.    Review of Systems  Constitutional: Negative.   HENT: Negative.   Eyes: Negative.   Respiratory: Negative.   Cardiovascular: Negative.   Gastrointestinal: Negative.   Skin: Negative.   Neurological: Negative.   Psychiatric/Behavioral: Positive for depression (Stable) and substance abuse (Hx. Polysubstance use disorder). Negative for hallucinations, memory loss and suicidal ideas. The patient has insomnia (Stable). The patient is not nervous/anxious.     Blood pressure 97/64, pulse 93, temperature 98.7 F (37.1 C), resp. rate 16, height 5\' 3"  (1.6 m), weight 69.4 kg (153 lb), SpO2 99 %.Body mass index is 27.1 kg/m.  See Md's SRA.  Have you used any form of tobacco in the last 30 days? (Cigarettes, Smokeless Tobacco, Cigars, and/or Pipes): Yes  Has this patient used any form of tobacco in the last 30 days? (Cigarettes, Smokeless Tobacco, Cigars, and/or Pipes): Yes, provided with a nicotine gum prescription upon discharge for smoking cessation.  Blood Alcohol level:  Lab Results  Component Value Date   ETH <5 10/29/2016   ETH <5 10/07/2016   Metabolic Disorder  Labs:  Lab Results  Component Value Date   HGBA1C 5.3 10/16/2016   MPG 105 10/16/2016   MPG 94 06/07/2016   Lab Results  Component Value Date   PROLACTIN 8.8 10/16/2016   PROLACTIN 49.9 (H) 06/07/2016   Lab Results  Component Value Date   CHOL 207 (H) 10/16/2016   TRIG 140 10/16/2016   HDL 104 10/16/2016   CHOLHDL 2.0 10/16/2016   VLDL 28 10/16/2016   LDLCALC 75 10/16/2016   LDLCALC 76 06/07/2016   See Psychiatric Specialty Exam and Suicide Risk Assessment completed by Attending Physician prior to discharge.  Discharge destination:  Home  Is patient on multiple antipsychotic therapies at discharge:  No   Has Patient had three or more failed trials of antipsychotic monotherapy by history:  No  Recommended Plan for Multiple Antipsychotic Therapies: NA  Allergies as of 11/08/2016      Reactions   Azithromycin Anaphylaxis   Nsaids Other (See Comments)   Reaction:  Stomach ulcers       Medication List    STOP taking these medications   carbamazepine 200 MG tablet Commonly known as:  TEGRETOL   sertraline 50 MG tablet Commonly known as:  ZOLOFT     TAKE these medications     Indication  benztropine 0.5 MG tablet Commonly known as:  COGENTIN Take 1 tablet (0.5 mg total) by mouth at bedtime. For prevention of drug induced tremors. Start taking on:  11/09/2016  Indication:  Extrapyramidal Reaction caused by Medications   clotrimazole 1 % vaginal cream Commonly known as:  GYNE-LOTRIMIN Place 1 Applicatorful vaginally at bedtime. 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 For agitation  Indication:  Agitation   hydrOXYzine 25 MG tablet Commonly known as:  ATARAX/VISTARIL Take 1 tablet (25 mg) three times daily as needed: For anxiety What changed:  how much to take  how to take  this  when to take this  reasons to take this  additional instructions  Indication:  Anxiety Neurosis   levothyroxine 50 MCG tablet Commonly known as:  SYNTHROID, LEVOTHROID Take 1 tablet (50 mcg total) by mouth daily before breakfast. For low thyroid Hormone  Indication:  Underactive Thyroid   mirtazapine 45 MG tablet Commonly known as:  REMERON Take 1 tablet (45 mg total) by mouth at bedtime. For depression/sleep  Indication:  Trouble Sleeping, Major Depressive Disorder   nicotine polacrilex 2 MG gum Commonly known as:  NICORETTE Take 1 each (2 mg total) by mouth as needed for smoking cessation. What changed:  when to take this  Indication:  Nicotine Addiction   prazosin 2 MG capsule Commonly known as:  MINIPRESS Take 1 capsule (2 mg total) by mouth at bedtime. For nightmares  Indication:  PTSD related nightmares   propranolol 20 MG tablet Commonly known as:  INDERAL Take 1 tablet (20 mg total) by mouth 3 (three) times daily. For anxiety  Indication:  Anxiety   QUEtiapine 300 MG tablet Commonly known as:  SEROQUEL Take 2 tablets (600 mg total) by mouth at bedtime. For mood control What changed:  medication strength  how much to take  Another medication with the same name was removed. Continue taking this medication, and follow the directions you see here.  Indication:  Mood stabilization      Follow-up Information    [facility] Follow up.   Specialty:  Addiction Medicine Why:  [facility] referral made on Friday 11/04/16. If you are still interested in attending this facility, please continue to follow-up daily with [facility] in admissions to check bed availability and referral status. Thank you.  Contact information: [address] [place] [address] [phone]        Services, [facility] Follow up on 11/10/2016.   Why:  Appt for hospital follow-up on Thursday at 9:00AM. Please bring: proof of income if you have it.  Thank you.  Contact information: [address] [place] [address] [phone]          Follow-up recommendations: Activity:  As tolerated Diet: As recommended by your primary care doctor. Keep all scheduled follow-up appointments as recommended.   Comments: Patient is instructed prior to discharge to: Take all medications as prescribed by his/her mental healthcare provider. Report any adverse effects and or reactions from the medicines to his/her outpatient provider promptly. Patient has been instructed & cautioned: To not engage in alcohol and or illegal drug use while on prescription medicines. In the event of worsening symptoms, patient is instructed to call the  crisis hotline, 911 and or go to the nearest ED for appropriate evaluation and treatment of symptoms. To follow-up with his/her primary care provider for your other medical issues, concerns and or health care needs.    Signed: Sanjuana Kava, NP PMHNP-BC, FNP-BC 11/08/2016, 4:04 PM

## 2016-11-08 NOTE — Tx Team (Signed)
Interdisciplinary Treatment and Diagnostic Plan Update  11/08/2016 Time of Session: 9:49 AM  Becky [name] MRN: [id number]  Principal Diagnosis: Bipolar disorder, curr episode mixed, severe, with psychotic features ([place])  Secondary Diagnoses: Principal Problem:   Bipolar disorder, curr episode mixed, severe, with psychotic features ([place]) Active Problems:   Sedative, hypnotic or anxiolytic use disorder, severe, dependence (HCC)   Cocaine use disorder, severe, dependence ([place])   Cannabis use disorder, severe, dependence ([place])   PTSD (post-traumatic stress disorder)   Current Medications:  Current Facility-Administered Medications  Medication Dose Route Frequency Provider Last Rate Last Dose  . acetaminophen (TYLENOL) tablet 650 mg  650 mg Oral Q6H PRN Okonkwo, Justina A, NP   650 mg at 11/08/16 4098  . alum & mag hydroxide-simeth (MAALOX/MYLANTA) 200-200-20 MG/5ML suspension 30 mL  30 mL Oral Q4H PRN Okonkwo, Justina A, NP   30 mL at 11/07/16 0458  . [START ON 11/09/2016] benztropine (COGENTIN) tablet 0.5 mg  0.5 mg Oral QHS Eappen, Saramma, MD      . clotrimazole (GYNE-LOTRIMIN) vaginal cream 1 Applicatorful  1 Applicatorful Vaginal QHS Izediuno, Laruth Bouchard, MD   1 Applicatorful at 11/91/47 2112  . gabapentin (NEURONTIN) capsule 600 mg  600 mg Oral TID Lu Duffel, Justina A, NP   600 mg at 11/08/16 0858  . levothyroxine (SYNTHROID, LEVOTHROID) tablet 50 mcg  50 mcg Oral QAC breakfast Okonkwo, Justina A, NP   50 mcg at 11/08/16 8295  . magnesium hydroxide (MILK OF MAGNESIA) suspension 30 mL  30 mL Oral Daily PRN Okonkwo, Justina A, NP      . mirtazapine (REMERON) tablet 45 mg  45 mg Oral QHS Okonkwo, Justina A, NP   45 mg at 11/07/16 2112  . multivitamin with minerals tablet 1 tablet  1 tablet Oral Daily Ursula Alert, MD   1 tablet at 11/08/16 0858  . nicotine polacrilex (NICORETTE) gum 2 mg  2 mg Oral PRN Patriciaann Clan E, PA-C   2 mg at 11/08/16 0859  . prazosin (MINIPRESS) capsule 2  mg  2 mg Oral QHS Okonkwo, Justina A, NP   2 mg at 11/07/16 2215  . propranolol (INDERAL) tablet 20 mg  20 mg Oral TID Artist Beach, MD   20 mg at 11/08/16 0858  . QUEtiapine (SEROQUEL) tablet 600 mg  600 mg Oral QHS Izediuno, Laruth Bouchard, MD   600 mg at 11/07/16 2112  . thiamine (VITAMIN B-1) tablet 100 mg  100 mg Oral Daily Eappen, Ria Clock, MD   100 mg at 11/08/16 6213    PTA Medications: Prescriptions Prior to Admission  Medication Sig Dispense Refill Last Dose  . carbamazepine (TEGRETOL) 200 MG tablet Take 1 tablet (200 mg total) by mouth 3 (three) times daily. For mood stabilizatyion 90 tablet 0 Past Week at Unknown time  . gabapentin (NEURONTIN) 300 MG capsule Take 2 capsules (600 mg total) by mouth 3 (three) times daily. For agitation 180 capsule 0 Past Week at Unknown time  . hydrOXYzine (ATARAX/VISTARIL) 25 MG tablet Take 1 tablet (25 mg) three times daily as needed: for anxiety (Patient taking differently: Take 25 mg by mouth 3 (three) times daily as needed for anxiety. ) 60 tablet 0 Past Week at Unknown time  . levothyroxine (SYNTHROID, LEVOTHROID) 50 MCG tablet Take 1 tablet (50 mcg total) by mouth daily before breakfast. For low thyroid Hormone 30 tablet 0 Past Week at Unknown time  . mirtazapine (REMERON) 45 MG tablet Take 1 tablet (45 mg total) by mouth  at bedtime. For depression/sleep 30 tablet 0 Past Week at Unknown time  . nicotine polacrilex (NICORETTE) 2 MG gum Take 1 each (2 mg total) by mouth as needed for smoking cessation. (Patient taking differently: Take 2 mg by mouth every 4 (four) hours as needed for smoking cessation. ) 100 tablet 0 Past Week at Unknown time  . prazosin (MINIPRESS) 2 MG capsule Take 1 capsule (2 mg total) by mouth at bedtime. For nightmares 30 capsule 0 Past Week at Unknown time  . QUEtiapine (SEROQUEL) 200 MG tablet Take 1 tablet (200 mg total) by mouth 2 (two) times daily. For agitation/mood control 60 tablet 0 Past Week at Unknown time  .  QUEtiapine (SEROQUEL) 400 MG tablet Take 1 tablet (400 mg total) by mouth at bedtime. For mood control 30 tablet 0 Past Week at Unknown time  . sertraline (ZOLOFT) 50 MG tablet Take 1 tablet (50 mg total) by mouth daily. For depression 30 tablet 0 Past Week at Unknown time    Treatment Modalities: Medication Management, Group therapy, Case management,  1 to 1 session with clinician, Psychoeducation, Recreational therapy.   Physician Treatment Plan for Primary Diagnosis: Bipolar disorder, curr episode mixed, severe, with psychotic features ([place]) Long Term Goal(s): Improvement in symptoms so as ready for discharge  Short Term Goals: Ability to identify changes in lifestyle to reduce recurrence of condition will improve Ability to verbalize feelings will improve Compliance with prescribed medications will improve Ability to identify triggers associated with substance abuse/mental health issues will improve Ability to identify changes in lifestyle to reduce recurrence of condition will improve Ability to verbalize feelings will improve Compliance with prescribed medications will improve Ability to identify triggers associated with substance abuse/mental health issues will improve  Medication Management: Evaluate patient's response, side effects, and tolerance of medication regimen.  Therapeutic Interventions: 1 to 1 sessions, Unit Group sessions and Medication administration.  Evaluation of Outcomes: Adequate for discharge   Physician Treatment Plan for Secondary Diagnosis: Principal Problem:   Bipolar disorder, curr episode mixed, severe, with psychotic features ([place]) Active Problems:   Sedative, hypnotic or anxiolytic use disorder, severe, dependence (HCC)   Cocaine use disorder, severe, dependence ([place])   Cannabis use disorder, severe, dependence ([place])   PTSD (post-traumatic stress disorder)   Long Term Goal(s): Improvement in symptoms so as ready for discharge  Short Term Goals:  Ability to identify changes in lifestyle to reduce recurrence of condition will improve Ability to verbalize feelings will improve Compliance with prescribed medications will improve Ability to identify triggers associated with substance abuse/mental health issues will improve Ability to identify changes in lifestyle to reduce recurrence of condition will improve Ability to verbalize feelings will improve Compliance with prescribed medications will improve Ability to identify triggers associated with substance abuse/mental health issues will improve  Medication Management: Evaluate patient's response, side effects, and tolerance of medication regimen.  Therapeutic Interventions: 1 to 1 sessions, Unit Group sessions and Medication administration.  Evaluation of Outcomes: Adequate for discharge    RN Treatment Plan for Primary Diagnosis: Bipolar disorder, curr episode mixed, severe, with psychotic features ([place]) Long Term Goal(s): Knowledge of disease and therapeutic regimen to maintain health will improve  Short Term Goals: Ability to identify and develop effective coping behaviors will improve and Compliance with prescribed medications will improve  Medication Management: RN will administer medications as ordered by provider, will assess and evaluate patient's response and provide education to patient for prescribed medication. RN will report any adverse and/or side  effects to prescribing provider.  Therapeutic Interventions: 1 on 1 counseling sessions, Psychoeducation, Medication administration, Evaluate responses to treatment, Monitor vital signs and CBGs as ordered, Perform/monitor CIWA, COWS, AIMS and Fall Risk screenings as ordered, Perform wound care treatments as ordered.  Evaluation of Outcomes: Adequate for discharge    LCSW Treatment Plan for Primary Diagnosis: Bipolar disorder, curr episode mixed, severe, with psychotic features ([place]) Long Term Goal(s): Safe transition to  appropriate next level of care at discharge, Engage patient in therapeutic group addressing interpersonal concerns.  Short Term Goals: Engage patient in aftercare planning with referrals and resources  Therapeutic Interventions: Assess for all discharge needs, 1 to 1 time with Social worker, Explore available resources and support systems, Assess for adequacy in community support network, Educate family and significant other(s) on suicide prevention, Complete Psychosocial Assessment, Interpersonal group therapy.  Evaluation of Outcomes: Met     Progress in Treatment: Attending groups: Yes Participating in groups:Yes Taking medication as prescribed: Yes Toleration medication: Yes, no side effects reported at this time Family/Significant other contact made: No Patient understands diagnosis: Yes, insight is improving Discussing patient identified problems/goals with staff: Yes Medical problems stabilized or resolved: Yes Denies suicidal/homicidal ideation: Yes Issues/concerns per patient self-inventory: None Other: N/A  New problem(s) identified: "get better on medications and learning coping skills."  New Short Term/Long Term Goal(s): None identified at this time.   Discharge Plan or Barriers: Pt was given numbers for [facility] and [facility] in [place]. She was referred to [facility] and is on waitlist. Pt plans to return home; follow-up made with [facility]. Pt's boyfriend will pick her up this afternoon.   Reason for Continuation of Hospitalization: None  Estimated Length of Stay: discharge today, 11/08/16  Attendees: Patient: 11/08/2016  9:49 AM  Physician: Ursula Alert, MD 11/08/2016  9:49 AM  Nursing: Bosie Clos RN 11/08/2016  9:49 AM  RN Care Manager: Lars Pinks, RN 11/08/2016  9:49 AM  Social Worker: Ripley Fraise 11/08/2016  9:49 AM  Recreational Therapist: x 11/08/2016  9:49 AM  Other: Lindell Spar NP 11/08/2016  9:49 AM  Other:  11/08/2016  9:49 AM    Scribe  for Treatment Team: Maxie Better, MSW, LCSW Clinical Social Worker 11/08/2016 9:52 AM

## 2016-11-08 NOTE — Progress Notes (Signed)
  [facility] Discharge Plan :  Will you be returning to the same living situation after discharge:  Yes,  home with family At discharge, do you have transportation home?: Yes,  boyfriend Do you have the ability to pay for your medications: Yes,  mental health'  Release of information consent forms completed and submitted to medical records by CSW.  Patient to Follow up at: Follow-up Information    [facility] Follow up.   Specialty:  Addiction Medicine Why:  [facility] referral made on Friday 11/04/16. If you are still interested in attending this facility, please [facility] to [facility] to check bed availability and referral status. Thank you.  Contact information: [address] [place] [address] [phone]        [facility] Follow up on 11/10/2016.   Why:  Appt for hospital follow-up on Thursday at 9:00AM. Please bring: proof of income if you have it. Thank you.  Contact information: [address] [place] [address] [phone]           Next level of care provider has access to [facility] Link:no  Safety Planning and Suicide Prevention discussed: Yes,  SPE completed with pt; pt declined to consent to family contact.   Have you used any form of tobacco in the last 30 days? (Cigarettes, Smokeless Tobacco, Cigars, and/or Pipes): Yes  Has patient been referred to the Quitline?: Patient refused referral  Patient has been referred for addiction treatment: Yes  Zakaria Fromer N Smart LCSW 11/08/2016, 9:48 AM

## 2016-11-08 NOTE — Progress Notes (Signed)
Pt d/c from the hospital. All items returned. D/C instructions given, prescriptions given and samples given. Pt denies si and hi. 

## 2016-11-08 NOTE — [facility] Suicide Risk Assessment (Signed)
[facility] Discharge Suicide Risk Assessment   Principal Problem: Bipolar disorder, curr episode mixed, severe, with psychotic features [facility]) Discharge Diagnoses:  Patient Active Problem List   Diagnosis Date Noted  . Bipolar disorder, curr episode mixed, severe, with psychotic features (HCC) [F31.64] 11/02/2016  . PTSD (post-traumatic stress disorder) [F43.10] 11/02/2016  . GAD (generalized anxiety disorder) [F41.1] 10/16/2016  . Opioid use disorder, moderate, dependence (HCC) [F11.20] 06/06/2016  . Sedative, hypnotic or anxiolytic use disorder, severe, dependence (HCC) [F13.20] 06/06/2016  . Cocaine use disorder, severe, dependence (HCC) [F14.20] 06/06/2016  . Cannabis use disorder, severe, dependence (HCC) [F12.20] 06/06/2016  . MDD (major depressive disorder), recurrent severe, without psychosis (HCC) [F33.2] 06/03/2016  . Bipolar I disorder, most recent episode depressed (HCC) [F31.30] 07/04/2015  . Overdose [T50.901A] 07/02/2015  . Lactic acidosis [E87.2] 07/02/2015  . Acute respiratory failure (HCC) [J96.00] 07/02/2015  . Sepsis (HCC) [A41.9] 07/02/2015  . AKI (acute kidney injury) (HCC) [N17.9]   . Altered mental status [R41.82]   . Pyrexia [R50.9]   . Abdominal pain, epigastric [R10.13] 06/17/2015  . Hepatitis C antibody test positive [R76.8] 06/17/2015  . Peptic ulcer disease [K27.9] 06/17/2015  . Epigastric pain [R10.13]   . Transaminitis [R74.0] 06/16/2015  . History of substance abuse [Z87.898] 06/16/2015    Total Time spent with patient: 30 minutes  Musculoskeletal: Strength & Muscle Tone: within normal limits Gait & Station: normal Patient leans: N/A  Psychiatric Specialty Exam: Review of Systems  Psychiatric/Behavioral: Positive for substance abuse. Negative for depression and suicidal ideas.  All other systems reviewed and are negative.   Blood pressure 103/64, pulse 92, temperature 98.7 F (37.1 C), resp. rate 18, height 5\' 3"  (1.6 m), weight 69.4 kg (153 lb),  SpO2 99 %.Body mass index is 27.1 kg/m.  General Appearance: Casual  Eye Contact::  Fair  Speech:  Clear and Coherent409  Volume:  Normal  Mood:  Euthymic  Affect:  Appropriate  Thought Process:  Goal Directed and Descriptions of Associations: Intact  Orientation:  Full (Time, Place, and Person)  Thought Content:  Logical  Suicidal Thoughts:  No  Homicidal Thoughts:  No  Memory:  Immediate;   Fair Recent;   Fair Remote;   Fair  Judgement:  Fair  Insight:  Fair  Psychomotor Activity:  Normal  Concentration:  Fair  Recall:  [employer] of Knowledge:Fair  Language: Fair  Akathisia:  No  Handed:  Right  AIMS (if indicated):     Assets:  Communication Skills Desire for Improvement  Sleep:  Number of Hours: 6.25  Cognition: WNL  ADL's:  Intact   Mental Status Per Nursing Assessment::   On Admission:     Demographic Factors:  Caucasian  Loss Factors: NA  Historical Factors: Impulsivity  Risk Reduction Factors:   Positive social support and Positive therapeutic relationship  Continued Clinical Symptoms:  Alcohol/Substance Abuse/Dependencies Previous Psychiatric Diagnoses and Treatments  Cognitive Features That Contribute To Risk:  None    Suicide Risk:  Minimal: No identifiable suicidal ideation.  Patients presenting with no risk factors but with morbid ruminations; may be classified as minimal risk based on the severity of the depressive symptoms  Follow-up Information    [facility] Follow up.   Specialty:  Addiction Medicine Why:  [facility] referral made on Friday 11/04/16. If you are still interested in attending this facility, please continue to follow-up daily with [facility] in admissions to check bed availability and referral status. Thank you.  Contact information: [address] [place] [address] [phone]        [facility] Follow up.   Why:  appt needed: Olegario MessierKathy [phone] Contact information: [address] [place] [address] [phone]           Plan Of Care/Follow-up recommendations:  Activity:  no restrictions Diet:  regular Tests:  as needed Other:  follow up with aftercare  Sallyann Kinnaird, MD 11/08/2016, 9:42 AM

## 2016-11-24 ENCOUNTER — Emergency Department (HOSPITAL_COMMUNITY)
Admission: EM | Admit: 2016-11-24 | Discharge: 2016-11-28 | Disposition: A | Discharge status: Home / Self Care | Payer: No Typology Code available for payment source | Attending: Emergency Medicine | Admitting: Emergency Medicine

## 2016-11-24 ENCOUNTER — Encounter (HOSPITAL_COMMUNITY): Payer: Self-pay

## 2016-11-24 DIAGNOSIS — R45851 Suicidal ideations: Secondary | ICD-10-CM

## 2016-11-24 DIAGNOSIS — F1410 Cocaine abuse, uncomplicated: Secondary | ICD-10-CM | POA: Insufficient documentation

## 2016-11-24 DIAGNOSIS — F13120 Sedative, hypnotic or anxiolytic abuse with intoxication, uncomplicated: Secondary | ICD-10-CM | POA: Insufficient documentation

## 2016-11-24 DIAGNOSIS — Z79899 Other long term (current) drug therapy: Secondary | ICD-10-CM | POA: Insufficient documentation

## 2016-11-24 DIAGNOSIS — F1210 Cannabis abuse, uncomplicated: Secondary | ICD-10-CM | POA: Insufficient documentation

## 2016-11-24 DIAGNOSIS — R4182 Altered mental status, unspecified: Secondary | ICD-10-CM | POA: Insufficient documentation

## 2016-11-24 DIAGNOSIS — R443 Hallucinations, unspecified: Secondary | ICD-10-CM

## 2016-11-24 DIAGNOSIS — F1914 Other psychoactive substance abuse with psychoactive substance-induced mood disorder: Secondary | ICD-10-CM | POA: Insufficient documentation

## 2016-11-24 DIAGNOSIS — F1110 Opioid abuse, uncomplicated: Secondary | ICD-10-CM | POA: Insufficient documentation

## 2016-11-24 DIAGNOSIS — F3164 Bipolar disorder, current episode mixed, severe, with psychotic features: Secondary | ICD-10-CM | POA: Insufficient documentation

## 2016-11-24 DIAGNOSIS — F17210 Nicotine dependence, cigarettes, uncomplicated: Secondary | ICD-10-CM | POA: Insufficient documentation

## 2016-11-24 NOTE — ED Triage Notes (Signed)
[place] that she wanted to hang herself
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
## 2016-11-25 DIAGNOSIS — F1490 Cocaine use, unspecified, uncomplicated: Secondary | ICD-10-CM

## 2016-11-25 DIAGNOSIS — F4310 Post-traumatic stress disorder, unspecified: Secondary | ICD-10-CM | POA: Diagnosis not present

## 2016-11-25 DIAGNOSIS — Z811 Family history of alcohol abuse and dependence: Secondary | ICD-10-CM

## 2016-11-25 DIAGNOSIS — F3164 Bipolar disorder, current episode mixed, severe, with psychotic features: Secondary | ICD-10-CM | POA: Diagnosis not present

## 2016-11-25 DIAGNOSIS — F1290 Cannabis use, unspecified, uncomplicated: Secondary | ICD-10-CM

## 2016-11-25 DIAGNOSIS — R443 Hallucinations, unspecified: Secondary | ICD-10-CM | POA: Diagnosis not present

## 2016-11-25 DIAGNOSIS — Z818 Family history of other mental and behavioral disorders: Secondary | ICD-10-CM

## 2016-11-25 DIAGNOSIS — F1994 Other psychoactive substance use, unspecified with psychoactive substance-induced mood disorder: Secondary | ICD-10-CM

## 2016-11-25 DIAGNOSIS — F1390 Sedative, hypnotic, or anxiolytic use, unspecified, uncomplicated: Secondary | ICD-10-CM

## 2016-11-25 DIAGNOSIS — F17210 Nicotine dependence, cigarettes, uncomplicated: Secondary | ICD-10-CM

## 2016-11-25 DIAGNOSIS — Z813 Family history of other psychoactive substance abuse and dependence: Secondary | ICD-10-CM

## 2016-11-25 DIAGNOSIS — F1190 Opioid use, unspecified, uncomplicated: Secondary | ICD-10-CM

## 2016-11-25 DIAGNOSIS — Z79899 Other long term (current) drug therapy: Secondary | ICD-10-CM

## 2016-11-25 LAB — COMPREHENSIVE METABOLIC PANEL
ALT: 123 U/L — AB (ref 14–54)
AST: 67 U/L — ABNORMAL HIGH (ref 15–41)
Albumin: 3.9 g/dL (ref 3.5–5.0)
Alkaline Phosphatase: 122 U/L (ref 38–126)
Anion gap: 12 (ref 5–15)
BUN: 9 mg/dL (ref 6–20)
CHLORIDE: 104 mmol/L (ref 101–111)
CO2: 22 mmol/L (ref 22–32)
CREATININE: 0.78 mg/dL (ref 0.44–1.00)
Calcium: 8.9 mg/dL (ref 8.9–10.3)
GFR calc non Af Amer: >60 mL/min (ref >60)
Glucose, Bld: 109 mg/dL — ABNORMAL HIGH (ref 65–99)
Potassium: 3.5 mmol/L (ref 3.5–5.1)
SODIUM: 138 mmol/L (ref 135–145)
Total Bilirubin: 0.6 mg/dL (ref 0.3–1.2)
Total Protein: 7.8 g/dL (ref 6.5–8.1)

## 2016-11-25 LAB — RAPID URINE DRUG SCREEN, HOSP PERFORMED
Amphetamines: NOT DETECTED
BARBITURATES: NOT DETECTED
Benzodiazepines: POSITIVE — AB
Cocaine: POSITIVE — AB
Opiates: POSITIVE — AB
TETRAHYDROCANNABINOL: POSITIVE — AB

## 2016-11-25 LAB — CBC WITH DIFFERENTIAL/PLATELET
BASOS ABS: 0 10*3/uL (ref 0.0–0.1)
BASOS PCT: 0 %
EOS ABS: 0 10*3/uL (ref 0.0–0.7)
EOS PCT: 0 %
HCT: 36.1 % (ref 36.0–46.0)
Hemoglobin: 12.1 g/dL (ref 12.0–15.0)
LYMPHS ABS: 3.2 10*3/uL (ref 0.7–4.0)
Lymphocytes Relative: 31 %
MCH: 30.5 pg (ref 26.0–34.0)
MCHC: 33.5 g/dL (ref 30.0–36.0)
MCV: 90.9 fL (ref 78.0–100.0)
Monocytes Absolute: 0.6 10*3/uL (ref 0.1–1.0)
Monocytes Relative: 6 %
Neutro Abs: 6.4 10*3/uL (ref 1.7–7.7)
Neutrophils Relative %: 63 %
PLATELETS: 372 10*3/uL (ref 150–400)
RBC: 3.97 MIL/uL (ref 3.87–5.11)
RDW: 14.9 % (ref 11.5–15.5)
WBC: 10.2 10*3/uL (ref 4.0–10.5)

## 2016-11-25 LAB — URINALYSIS, ROUTINE W REFLEX MICROSCOPIC
BILIRUBIN URINE: NEGATIVE
GLUCOSE, UA: NEGATIVE mg/dL
HGB URINE DIPSTICK: NEGATIVE
KETONES UR: NEGATIVE mg/dL
NITRITE: POSITIVE — AB
PH: 5 (ref 5.0–8.0)
Protein, ur: 30 mg/dL — AB
Specific Gravity, Urine: 1.023 (ref 1.005–1.030)

## 2016-11-25 LAB — SALICYLATE LEVEL: Salicylate Lvl: <7 mg/dL (ref 2.8–30.0)

## 2016-11-25 LAB — ACETAMINOPHEN LEVEL

## 2016-11-25 LAB — POC URINE PREG, ED: Preg Test, Ur: NEGATIVE

## 2016-11-25 LAB — ETHANOL: Alcohol, Ethyl (B): <5 mg/dL (ref <5)

## 2016-11-25 MED ORDER — BENZTROPINE MESYLATE 1 MG PO TABS
0.5000 mg | ORAL_TABLET | Freq: Every day | ORAL | Status: DC
  Administered 2016-11-25 – 2016-11-27 (×3): 0.5 mg via ORAL
  Filled 2016-11-25 (×3): qty 1

## 2016-11-25 MED ORDER — PRAZOSIN HCL 1 MG PO CAPS
2.0000 mg | ORAL_CAPSULE | Freq: Every day | ORAL | Status: DC
  Administered 2016-11-25 – 2016-11-27 (×3): 2 mg via ORAL
  Filled 2016-11-25 (×5): qty 2

## 2016-11-25 MED ORDER — GABAPENTIN 300 MG PO CAPS
600.0000 mg | ORAL_CAPSULE | Freq: Three times a day (TID) | ORAL | Status: DC
  Administered 2016-11-25 – 2016-11-28 (×10): 600 mg via ORAL
  Filled 2016-11-25 (×10): qty 2

## 2016-11-25 MED ORDER — QUETIAPINE FUMARATE 100 MG PO TABS
600.0000 mg | ORAL_TABLET | Freq: Every day | ORAL | Status: DC
Start: 2016-11-25 — End: 2016-11-28
  Administered 2016-11-25 – 2016-11-27 (×3): 600 mg via ORAL
  Filled 2016-11-25 (×3): qty 6

## 2016-11-25 MED ORDER — MIRTAZAPINE 15 MG PO TABS
45.0000 mg | ORAL_TABLET | Freq: Every day | ORAL | Status: DC
  Administered 2016-11-25 – 2016-11-27 (×3): 45 mg via ORAL
  Filled 2016-11-25 (×5): qty 3

## 2016-11-25 MED ORDER — ONDANSETRON HCL 4 MG PO TABS: 4.0000 mg | ORAL_TABLET | Freq: Three times a day (TID) | ORAL | Status: DC | PRN

## 2016-11-25 MED ORDER — HYDROXYZINE HCL 25 MG PO TABS
25.0000 mg | ORAL_TABLET | Freq: Three times a day (TID) | ORAL | Status: DC | PRN
  Administered 2016-11-26 – 2016-11-27 (×4): 25 mg via ORAL
  Filled 2016-11-25 (×4): qty 1

## 2016-11-25 MED ORDER — PROPRANOLOL HCL 10 MG PO TABS
20.0000 mg | ORAL_TABLET | Freq: Three times a day (TID) | ORAL | Status: DC
  Administered 2016-11-25 – 2016-11-28 (×10): 20 mg via ORAL
  Filled 2016-11-25 (×10): qty 2

## 2016-11-25 NOTE — ED Notes (Signed)
Pt awake, given breakfast tray.

## 2016-11-25 NOTE — ED Notes (Signed)
Pt heard screaming, crying and yelling at the TTS monitor.

## 2016-11-25 NOTE — ED Notes (Signed)
Handcuffs removed by [employer].

## 2016-11-25 NOTE — ED Notes (Signed)
Meal given

## 2016-11-25 NOTE — Progress Notes (Signed)
Patient meets criteria for inpatient treatment. CSW faxed referrals to the following inpatient facilities for review:  [facility], [facility], [facility], [facility], [facility] [address] [facility], [facility], [facility], [facility]   TTS will continue to seek bed placement.   Baldo DaubJolan Mathias Bogacki MSW, LCSWA CSW Disposition [phone]

## 2016-11-25 NOTE — ED Notes (Signed)
Patient is calm and cooperative.  

## 2016-11-25 NOTE — ED Notes (Signed)
This nurse rounded on patient and she would only wave at nurse when asked how she was doing this morning. Officer would ask patient how she was doing and she would respond with "me" and point to herself.   Pt only drank apple juice from meal tray.
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
## 2016-11-25 NOTE — ED Notes (Signed)
Harlen LabsGary Adkins, boyfriend, called to check on patient. This nurse asked patient if it was ok to share information about her care with him, she responded with, "fuck no." This nurse asked if I could share that she was ok and we would be taking care of her for a while, she said yes.

## 2016-11-25 NOTE — ED Notes (Signed)
Pt refusing to eat meal tray. Pt requesting sprite. Tolerating fluids.

## 2016-11-25 NOTE — Consult Note (Signed)
Telepsych Consultation   Reason for Consult: Psychosis Referring Physician: EDP Patient Identification: Cherese Lozano MRN:  [id number] Principal Diagnosis: <principal problem not specified> Diagnosis:   Patient Active Problem List   Diagnosis Date Noted  . Hallucinations [R44.3]   . Bipolar disorder, curr episode mixed, severe, with psychotic features (HCC) [F31.64] 11/02/2016  . PTSD (post-traumatic stress disorder) [F43.10] 11/02/2016  . GAD (generalized anxiety disorder) [F41.1] 10/16/2016  . Opioid use disorder, moderate, dependence (HCC) [F11.20] 06/06/2016  . Sedative, hypnotic or anxiolytic use disorder, severe, dependence (HCC) [F13.20] 06/06/2016  . Cocaine use disorder, severe, dependence (HCC) [F14.20] 06/06/2016  . Cannabis use disorder, severe, dependence (HCC) [F12.20] 06/06/2016  . MDD (major depressive disorder), recurrent severe, without psychosis (HCC) [F33.2] 06/03/2016  . Bipolar I disorder, most recent episode depressed (HCC) [F31.30] 07/04/2015  . Overdose [T50.901A] 07/02/2015  . Lactic acidosis [E87.2] 07/02/2015  . Acute respiratory failure (HCC) [J96.00] 07/02/2015  . Sepsis (HCC) [A41.9] 07/02/2015  . AKI (acute kidney injury) (HCC) [N17.9]   . Altered mental status [R41.82]   . Pyrexia [R50.9]   . Abdominal pain, epigastric [R10.13] 06/17/2015  . Hepatitis C antibody test positive [R76.8] 06/17/2015  . Peptic ulcer disease [K27.9] 06/17/2015  . Epigastric pain [R10.13]   . Transaminitis [R74.0] 06/16/2015  . History of substance abuse [Z87.898] 06/16/2015    Total Time spent with patient: 30 minutes  Subjective:   Icel Castles is a 26 y.o. female patient admitted with Opioid Use Disorder, Cannabis Use Disorder, Cocaine Use disorder, Sedative Use Disorder, Substance Induced Mood Disorder, Psychosis.  HPI: Per the admission assessment completed on Today by Kandace Parkins: [name] is an 26 y.o. female brought in by [facility] due  to her parents being concerned about Amenah hallucinating.  Daziya denies SI; however, sts she has  A history of SI.  Jaelen denies HI and does not endorse AVH, however, she was observed actively hallucinating during the interview.  Charene was talking to images she was seeing; however, the clinician could not see the image. Fritzi admits to using Opioids, Cocaine, Marijuana, and Benzo (Xanax).    Markiya sts she lives with her parents, however, she could not say if she could return.  At some point in the interview she became belligerent and the police had to put hand cuffs on her.  Jilian engaged in the interview; however, she was incoherent and aloof to many of the questions asked.  She denies having any legal issues or upcoming court dates.  Syna presented in hospital scrubs with a disheveled appearance.  She had fair eye contact and incoherent language.  She did not have freedom of movement due to her appearing to be a flight risk.  Her thought content was not logical.  Her mood was anxious, angry, and depressed. Her affect was congruent with her mood.  She was oriented to place only.    On Exam: Patient was seen, chart reviewed with treatment team. Patient is alert and oriented to x4. She stated she came to the hospital herself because of pills. Patient was unable to elaborate on what she meant by that. Patient appears delusional and was actively responding to internal stimuli. She stops talking at times, looks around like as if someone is in the room and also stares into an empty space at times. She responded "I don't know" to most of the questions but denies any SI/HI/VAH. Patient tested positive on multiples substances including cocaine, benzo, THC and opiates and she admits to using  them weekly. Patient was unable to properly engage in the assessment due to ongoing psychosis.   Past Psychiatric History: See H&P  Risk to Self: Suicidal Ideation: No-Not Currently/Within Last 6  Months Suicidal Intent: No-Not Currently/Within Last 6 Months Is patient at risk for suicide?: Yes Suicidal Plan?: No Access to Means: No What has been your use of drugs/alcohol within the last 12 months?: cocaine, opioid, marijuana, and xanax How many times?: 1 Other Self Harm Risks: Subtance abuse Triggers for Past Attempts: Other (Comment) (UTA) Intentional Self Injurious Behavior: None Risk to Others: Homicidal Ideation: No Thoughts of Harm to Others: No Current Homicidal Intent: No Current Homicidal Plan: No Access to Homicidal Means: No Identified Victim: na Assessment of Violence: None Noted Violent Behavior Description: aggressive towards the officer tonight Does patient have access to weapons?: No Criminal Charges Pending?: No Does patient have a court date: No Prior Inpatient Therapy: Prior Inpatient Therapy: Yes Prior Therapy Dates: 09/2016 Prior Therapy Facilty/Provider(s): [facility] Reason for Treatment: UTA Prior Outpatient Therapy: Prior Outpatient Therapy: Yes Prior Therapy Dates: UTA Prior Therapy Facilty/Provider(s): unknown Reason for Treatment: unknown Does patient have an ACCT team?: No Does patient have Intensive In-House Services?  : No Does patient have [facility] services? : No Does patient have P4CC services?: No  Past Medical History:  Past Medical History:  Diagnosis Date  . Bipolar 1 disorder ([place])   . Bronchitis   . GAD (generalized anxiety disorder)   . Hepatitis C antibody test positive 06/17/2015  . History of substance abuse 06/16/2015  . OCD (obsessive compulsive disorder)   . Peptic ulcer   . Polysubstance abuse   . PTSD (post-traumatic stress disorder)     Past Surgical History:  Procedure Laterality Date  . OTHER SURGICAL HISTORY     scar tissue removed from right ovary   . OTHER SURGICAL HISTORY     fallopian tube repair  . WISDOM TOOTH EXTRACTION     Family History:  Family History  Problem Relation Age of Onset  . Drug abuse  Brother   . Suicidality Brother   . Cancer Other   . Depression Mother   . Drug abuse Mother   . Alcoholism Father   . Bipolar disorder Cousin    Family Psychiatric  History:   Social History:  History  Alcohol Use  . Yes    Comment: occ     History  Drug Use  . Types: Marijuana, Methamphetamines, MDMA (Ecstacy), Oxycodone, Benzodiazepines, Cocaine    Social History   Social History  . Marital status: Legally Separated    Spouse name: N/A  . Number of children: N/A  . Years of education: N/A   Social History Main Topics  . Smoking status: Current Every Day Smoker    Packs/day: 1.00    Years: 10.00    Types: Cigarettes  . Smokeless tobacco: Never Used  . Alcohol use Yes     Comment: occ  . Drug use: Yes    Types: Marijuana, Methamphetamines, MDMA (Ecstacy), Oxycodone, Benzodiazepines, Cocaine  . Sexual activity: Yes    Birth control/ protection: None   Other Topics Concern  . None   Social History Narrative  . None   Additional Social History:    Allergies:   Allergies  Allergen Reactions  . Azithromycin Anaphylaxis  . Nsaids Other (See Comments)    Reaction:  Stomach ulcers      Labs:  Results for orders placed or performed during the hospital encounter of 11/24/16 (  from the past 48 hour(s))  Urinalysis, Routine w reflex microscopic     Status: Abnormal   Collection Time: 11/24/16 11:52 PM  Result Value Ref Range   Color, Urine YELLOW YELLOW   APPearance HAZY (A) CLEAR   Specific Gravity, Urine 1.023 1.005 - 1.030   pH 5.0 5.0 - 8.0   Glucose, UA NEGATIVE NEGATIVE mg/dL   Hgb urine dipstick NEGATIVE NEGATIVE   Bilirubin Urine NEGATIVE NEGATIVE   Ketones, ur NEGATIVE NEGATIVE mg/dL   Protein, ur 30 (A) NEGATIVE mg/dL   Nitrite POSITIVE (A) NEGATIVE   Leukocytes, UA MODERATE (A) NEGATIVE   RBC / HPF 6-30 0 - 5 RBC/hpf   WBC, UA 6-30 0 - 5 WBC/hpf   Bacteria, UA MANY (A) NONE SEEN   Squamous Epithelial / LPF 6-30 (A) NONE SEEN   Mucous  PRESENT    Hyaline Casts, UA PRESENT   Rapid urine drug screen (hospital performed)     Status: Abnormal   Collection Time: 11/24/16 11:52 PM  Result Value Ref Range   Opiates POSITIVE (A) NONE DETECTED   Cocaine POSITIVE (A) NONE DETECTED   Benzodiazepines POSITIVE (A) NONE DETECTED   Amphetamines NONE DETECTED NONE DETECTED   Tetrahydrocannabinol POSITIVE (A) NONE DETECTED   Barbiturates NONE DETECTED NONE DETECTED    Comment:        DRUG SCREEN FOR MEDICAL PURPOSES ONLY.  IF CONFIRMATION IS NEEDED FOR ANY PURPOSE, NOTIFY LAB WITHIN 5 DAYS.        LOWEST DETECTABLE LIMITS FOR URINE DRUG SCREEN Drug Class       Cutoff (ng/mL) Amphetamine      1000 Barbiturate      200 Benzodiazepine   859 Tricyclics       956 Opiates          300 Cocaine          300 THC              50   POC Urine Pregnancy, ED (do NOT order at [facility])     Status: None   Collection Time: 11/25/16  1:38 AM  Result Value Ref Range   Preg Test, Ur NEGATIVE NEGATIVE    Comment:        THE SENSITIVITY OF THIS METHODOLOGY IS >24 mIU/mL   CBC with Differential     Status: None   Collection Time: 11/25/16  1:56 AM  Result Value Ref Range   WBC 10.2 4.0 - 10.5 K/uL   RBC 3.97 3.87 - 5.11 MIL/uL   Hemoglobin 12.1 12.0 - 15.0 g/dL   HCT 36.1 36.0 - 46.0 %   MCV 90.9 78.0 - 100.0 fL   MCH 30.5 26.0 - 34.0 pg   MCHC 33.5 30.0 - 36.0 g/dL   RDW 14.9 11.5 - 15.5 %   Platelets 372 150 - 400 K/uL   Neutrophils Relative % 63 %   Neutro Abs 6.4 1.7 - 7.7 K/uL   Lymphocytes Relative 31 %   Lymphs Abs 3.2 0.7 - 4.0 K/uL   Monocytes Relative 6 %   Monocytes Absolute 0.6 0.1 - 1.0 K/uL   Eosinophils Relative 0 %   Eosinophils Absolute 0.0 0.0 - 0.7 K/uL   Basophils Relative 0 %   Basophils Absolute 0.0 0.0 - 0.1 K/uL  Comprehensive metabolic panel     Status: Abnormal   Collection Time: 11/25/16  1:56 AM  Result Value Ref Range   Sodium 138 135 - 145 mmol/L  Potassium 3.5 3.5 - 5.1 mmol/L   Chloride 104 101 -  111 mmol/L   CO2 22 22 - 32 mmol/L   Glucose, Bld 109 (H) 65 - 99 mg/dL   BUN 9 6 - 20 mg/dL   Creatinine, Ser 0.78 0.44 - 1.00 mg/dL   Calcium 8.9 8.9 - 10.3 mg/dL   Total Protein 7.8 6.5 - 8.1 g/dL   Albumin 3.9 3.5 - 5.0 g/dL   AST 67 (H) 15 - 41 U/L   ALT 123 (H) 14 - 54 U/L   Alkaline Phosphatase 122 38 - 126 U/L   Total Bilirubin 0.6 0.3 - 1.2 mg/dL   GFR calc non Af Amer >60 >60 mL/min   GFR calc Af Amer >60 >60 mL/min    Comment: (NOTE) The eGFR has been calculated using the CKD EPI equation. This calculation has not been validated in all clinical situations. eGFR's persistently <60 mL/min signify possible Chronic Kidney Disease.    Anion gap 12 5 - 15  Ethanol     Status: None   Collection Time: 11/25/16  1:56 AM  Result Value Ref Range   Alcohol, Ethyl (B) <5 <5 mg/dL    Comment:        LOWEST DETECTABLE LIMIT FOR SERUM ALCOHOL IS 5 mg/dL FOR MEDICAL PURPOSES ONLY   Salicylate level     Status: None   Collection Time: 11/25/16  1:56 AM  Result Value Ref Range   Salicylate Lvl <0.5 2.8 - 30.0 mg/dL  Acetaminophen level     Status: Abnormal   Collection Time: 11/25/16  1:56 AM  Result Value Ref Range   Acetaminophen (Tylenol), Serum <10 (L) 10 - 30 ug/mL    Comment:        THERAPEUTIC CONCENTRATIONS VARY SIGNIFICANTLY. A RANGE OF 10-30 ug/mL MAY BE AN EFFECTIVE CONCENTRATION FOR MANY PATIENTS. HOWEVER, SOME ARE BEST TREATED AT CONCENTRATIONS OUTSIDE THIS RANGE. ACETAMINOPHEN CONCENTRATIONS >150 ug/mL AT 4 HOURS AFTER INGESTION AND >50 ug/mL AT 12 HOURS AFTER INGESTION ARE OFTEN ASSOCIATED WITH TOXIC REACTIONS.     Current Facility-Administered Medications  Medication Dose Route Frequency Provider Last Rate Last Dose  . benztropine (COGENTIN) tablet 0.5 mg  0.5 mg Oral QHS Rolland Porter, MD      . gabapentin (NEURONTIN) capsule 600 mg  600 mg Oral TID Rolland Porter, MD   600 mg at 11/25/16 3976  . hydrOXYzine (ATARAX/VISTARIL) tablet 25 mg  25 mg Oral TID  PRN Rolland Porter, MD      . mirtazapine (REMERON) tablet 45 mg  45 mg Oral QHS Rolland Porter, MD      . ondansetron [facility]) tablet 4 mg  4 mg Oral Q8H PRN Rolland Porter, MD      . prazosin (MINIPRESS) capsule 2 mg  2 mg Oral QHS Rolland Porter, MD      . propranolol (INDERAL) tablet 20 mg  20 mg Oral TID Rolland Porter, MD   20 mg at 11/25/16 [id number]  . QUEtiapine (SEROQUEL) tablet 600 mg  600 mg Oral QHS Rolland Porter, MD       Current Outpatient Prescriptions  Medication Sig Dispense Refill  . benztropine (COGENTIN) 0.5 MG tablet Take 1 tablet (0.5 mg total) by mouth at bedtime. For prevention of drug induced tremors. 30 tablet 0  . carbamazepine (TEGRETOL) 200 MG tablet take 1  Tablet by mouth 3 times daily FOR mood stabilization  0  . gabapentin (NEURONTIN) 300 MG capsule Take 2 capsules (600 mg total)  by mouth 3 (three) times daily. For agitation 180 capsule 0  . levothyroxine (SYNTHROID, LEVOTHROID) 50 MCG tablet Take 1 tablet (50 mcg total) by mouth daily before breakfast. For low thyroid Hormone 30 tablet 0  . mirtazapine (REMERON) 45 MG tablet Take 1 tablet (45 mg total) by mouth at bedtime. For depression/sleep (Patient taking differently: Take 90 mg by mouth at bedtime. For depression/sleep) 30 tablet 0  . nicotine polacrilex (NICORETTE) 2 MG gum Take 1 each (2 mg total) by mouth as needed for smoking cessation. 100 tablet 0  . prazosin (MINIPRESS) 2 MG capsule Take 1 capsule (2 mg total) by mouth at bedtime. For nightmares 30 capsule 0  . propranolol (INDERAL) 20 MG tablet Take 1 tablet (20 mg total) by mouth 3 (three) times daily. For anxiety 90 tablet 0  . sertraline (ZOLOFT) 50 MG tablet Take 1 Tablet by mouth once daily FOR depression  0  . Sertraline HCl (ZOLOFT PO) Take 1 tablet by mouth.    . clotrimazole (GYNE-LOTRIMIN) 1 % vaginal cream Place 1 Applicatorful vaginally at bedtime. For Vaginal yeast infection (Patient not taking: Reported on 11/25/2016) 45 g 0  . hydrOXYzine (ATARAX/VISTARIL) 25 MG  tablet Take 1 tablet (25 mg) three times daily as needed: For anxiety (Patient not taking: Reported on 11/25/2016) 60 tablet 0  . QUEtiapine (SEROQUEL) 300 MG tablet Take 2 tablets (600 mg total) by mouth at bedtime. For mood control (Patient not taking: Reported on 11/25/2016) 60 tablet 0    Musculoskeletal: UTA via camera.  Psychiatric Specialty Exam: Physical Exam  Nursing note and vitals reviewed.   Review of Systems  Psychiatric/Behavioral: Positive for hallucinations (visibly responding to Internal stimuli) and substance abuse (poly substance use). Negative for depression, memory loss and suicidal ideas. The patient is not nervous/anxious and does not have insomnia.   All other systems reviewed and are negative.   Blood pressure 131/87, pulse 98, temperature 98.1 F (36.7 C), temperature source Oral, resp. rate 18, SpO2 97 %.There is no height or weight on file to calculate BMI.  General Appearance: patient wearing hospital scrub  Eye Contact:  Minimal  Speech:  Clear and Coherent and Normal Rate  Volume:  Normal  Mood:  labile  Affect:  Congruent  Thought Process:  Disorganized and Irrelevant  Orientation:  Full (Time, Place, and Person)  Thought Content:  Delusions and Paranoid Ideation  Suicidal Thoughts:  No  Homicidal Thoughts:  No  Memory:  Immediate;   Fair Recent;   Fair Remote;   Poor  Judgement:  Impaired  Insight:  Shallow  Psychomotor Activity:  Normal  Concentration:  Concentration: Fair and Attention Span: Fair  Recall:  [employer] of Knowledge:  Fair  Language:  Good  Akathisia:  Negative  Handed:  Right  AIMS (if indicated):     Assets:  Communication Skills Desire for Improvement Physical Health Resilience Social Support  ADL's:  Intact  Cognition:  WNL  Sleep:        Treatment Plan Summary: Daily contact with patient to assess and evaluate symptoms and progress in treatment, Medication management and Plan to continue seeking inpatient  hospitalization for psychosis  Disposition: Recommend psychiatric Inpatient admission when medically cleared.  Vicenta Aly, NP 11/25/2016 11:10 AM

## 2016-11-25 NOTE — ED Notes (Signed)
Pt trying to run out into the hallway. Stopped by police. Pt is now handcuffed to the bed by her wrists.

## 2016-11-25 NOTE — ED Notes (Signed)
Updated [facility] on pt care.

## 2016-11-25 NOTE — ED Notes (Signed)
Given patient a meal tray.

## 2016-11-25 NOTE — ED Provider Notes (Signed)
[facility]-EM[facility]Y DEPT Provider Note   CSN: [id number] Arrival date & time: 11/24/16  2307  Time seen 12:15 AM   History   Chief Complaint Chief Complaint  Patient presents with  . V70.1   Level V caveat for psychiatric illness  HPI Becky [name] is a 26 y.o. female.  HPI  patient presents to the emergency department with the [facility] police. Patient refuses to talk. Per the police family called because patient has started talking to people who are not there. When patient arrived in the ED she was very loud and uncooperative. She is to answer any questions. However the police to report she told them she wanted to hang herself. Patient has a history of polysubstance abuse and hallucinations and has been admitted to behavioral health in the past. When I talked to patient she mainly shakes her head yes or no, she did relate to me she had nausea but no vomiting. She was offered nausea medication which she has refused. She was advised to let nursing staff know if she changes her mind.  PCP Toma Deiters, MD   Past Medical History:  Diagnosis Date  . Bipolar 1 disorder (HCC)   . Bronchitis   . GAD (generalized anxiety disorder)   . Hepatitis C antibody test positive 06/17/2015  . History of substance abuse 06/16/2015  . OCD (obsessive compulsive disorder)   . Peptic ulcer   . Polysubstance abuse   . PTSD (post-traumatic stress disorder)     Patient Active Problem List   Diagnosis Date Noted  . Bipolar disorder, curr episode mixed, severe, with psychotic features (HCC) 11/02/2016  . PTSD (post-traumatic stress disorder) 11/02/2016  . GAD (generalized anxiety disorder) 10/16/2016  . Opioid use disorder, moderate, dependence (HCC) 06/06/2016  . 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 Cocaine use disorder, severe, dependence (HCC) 06/06/2016  . Cannabis use disorder, severe, dependence (HCC) 06/06/2016  . MDD (major depressive  disorder), recurrent severe, without psychosis (HCC) 06/03/2016  . Bipolar I disorder, most recent episode depressed (HCC) 07/04/2015  . Overdose 07/02/2015  . Lactic acidosis 07/02/2015  . Acute respiratory failure (HCC) 07/02/2015  . Sepsis (HCC) 07/02/2015  . AKI (acute kidney injury) (HCC)   . Altered mental status   . Pyrexia   . Abdominal pain, epigastric 06/17/2015  . Hepatitis C antibody test positive 06/17/2015  . Peptic ulcer disease 06/17/2015  . Epigastric pain   . Transaminitis 06/16/2015  . History of substance abuse 06/16/2015    Past Surgical History:  Procedure Laterality Date  . OTHER SURGICAL HISTORY     scar tissue removed from right ovary   . OTHER SURGICAL HISTORY     fallopian tube repair  . WISDOM TOOTH EXTRACTION      OB History    Gravida Para Term Preterm AB Living   2       1 1    SAB TAB Ectopic Multiple Live Births                   Home Medications    Prior to Admission medications   Medication Sig Start Date End Date Taking? Authorizing Provider  benztropine (COGENTIN) 0.5 MG tablet Take 1 tablet (0.5 mg total) by mouth at bedtime. For prevention of drug induced tremors. 11/09/16   Armandina Stammer I, NP  clotrimazole (GYNE-LOTRIMIN) 1 % vaginal cream Place 1 Applicatorful vaginally at bedtime. For Vaginal yeast infection 11/08/16   Sanjuana Kava, NP  gabapentin (NEURONTIN) 300 MG capsule Take 2 capsules (600 mg total) by mouth 3 (three) times daily. For agitation 11/08/16   Armandina Stammer I, NP  hydrOXYzine (ATARAX/VISTARIL) 25 MG tablet Take 1 tablet (25 mg) three times daily as needed: For anxiety 11/08/16   Armandina Stammer I, NP  levothyroxine (SYNTHROID, LEVOTHROID) 50 MCG tablet Take 1 tablet (50 mcg total) by mouth daily before breakfast. For low thyroid Hormone 11/08/16   Armandina Stammer I, NP  mirtazapine (REMERON) 45 MG tablet Take 1 tablet (45 mg total) by mouth at bedtime. For depression/sleep 11/08/16   Armandina Stammer I, NP  nicotine polacrilex  (NICORETTE) 2 MG gum Take 1 each (2 mg total) by mouth as needed for smoking cessation. 11/08/16   Armandina Stammer I, NP  prazosin (MINIPRESS) 2 MG capsule Take 1 capsule (2 mg total) by mouth at bedtime. For nightmares 11/08/16   Armandina Stammer I, NP  propranolol (INDERAL) 20 MG tablet Take 1 tablet (20 mg total) by mouth 3 (three) times daily. For anxiety 11/08/16   Armandina Stammer I, NP  QUEtiapine (SEROQUEL) 300 MG tablet Take 2 tablets (600 mg total) by mouth at bedtime. For mood control 11/08/16   Sanjuana Kava, NP    Family History Family History  Problem Relation Age of Onset  . Drug abuse Brother   . Suicidality Brother   . Cancer Other   . Depression Mother   . Drug abuse Mother   . Alcoholism Father   . Bipolar disorder Cousin     Social History Social History  Substance Use Topics  . Smoking status: Current Every Day Smoker    Packs/day: 1.00    Years: 10.00    Types: Cigarettes  . Smokeless tobacco: Never Used  . Alcohol use Yes     Comment: occ     Allergies   Azithromycin and Nsaids   Review of Systems Review of Systems  Unable to perform ROS: Psychiatric disorder  Gastrointestinal: Positive for nausea.     Physical Exam Updated Vital Signs BP 131/79 (BP Location: Right Arm)   Pulse (!) 120   Temp 98.1 F (36.7 C) (Oral)   Resp 18   SpO2 97%   Vital signs normal Except for tachycardia   Physical Exam  Constitutional: She is oriented to person, place, and time. She appears well-developed and well-nourished.  Non-toxic appearance. She does not appear ill. No distress.  HENT:  Head: Normocephalic and atraumatic.  Right Ear: External ear normal.  Left Ear: External ear normal.  Nose: Nose normal. No mucosal edema or rhinorrhea.  Mouth/Throat: Oropharynx is clear and moist and mucous membranes are normal. No dental abscesses or uvula swelling.  Eyes: Pupils are equal, round, and reactive to light. Conjunctivae and EOM are normal.  Neck: Normal range of  motion and full passive range of motion without pain. Neck supple.  Cardiovascular: Normal rate, regular rhythm and normal heart sounds.  Exam reveals no gallop and no friction rub.   No murmur heard. Pulmonary/Chest: Effort normal and breath sounds normal. No respiratory distress. She has no wheezes. She has no rhonchi. She has no rales. She exhibits no tenderness and no crepitus.  Abdominal: Soft. Normal appearance and bowel sounds are normal. She exhibits no distension. There is no tenderness. There is no rebound and no guarding.  Musculoskeletal: Normal range of motion. She exhibits no edema or tenderness.  Moves all extremities well.   Neurological: She is alert and oriented to person, place,  and time. She has normal strength. No cranial nerve deficit.  Skin: Skin is warm, dry and intact. No rash noted. No erythema. No pallor.  Psychiatric: Her affect is angry. She is withdrawn and combative. She is noncommunicative.  Unable to assess, patient did whisper she is having nausea, patient appears to be angry  Nursing note and vitals reviewed.    ED Treatments / Results  Labs (all labs ordered are listed, but only abnormal results are displayed) Results for orders placed or performed during the hospital encounter of 11/24/16  CBC with Differential  Result Value Ref Range   WBC 10.2 4.0 - 10.5 K/uL   RBC 3.97 3.87 - 5.11 MIL/uL   Hemoglobin 12.1 12.0 - 15.0 g/dL   HCT 16.136.1 09.636.0 - 04.546.0 %   MCV 90.9 78.0 - 100.0 fL   MCH 30.5 26.0 - 34.0 pg   MCHC 33.5 30.0 - 36.0 g/dL   RDW 40.914.9 81.111.5 - 91.415.5 %   Platelets 372 150 - 400 K/uL   Neutrophils Relative % 63 %   Neutro Abs 6.4 1.7 - 7.7 K/uL   Lymphocytes Relative 31 %   Lymphs Abs 3.2 0.7 - 4.0 K/uL   Monocytes Relative 6 %   Monocytes Absolute 0.6 0.1 - 1.0 K/uL   Eosinophils Relative 0 %   Eosinophils Absolute 0.0 0.0 - 0.7 K/uL   Basophils Relative 0 %   Basophils Absolute 0.0 0.0 - 0.1 K/uL  Comprehensive metabolic panel  Result  Value Ref Range   Sodium 138 135 - 145 mmol/L   Potassium 3.5 3.5 - 5.1 mmol/L   Chloride 104 101 - 111 mmol/L   CO2 22 22 - 32 mmol/L   Glucose, Bld 109 (H) 65 - 99 mg/dL   BUN 9 6 - 20 mg/dL   Creatinine, Ser 7.820.78 0.44 - 1.00 mg/dL   Calcium 8.9 8.9 - 95.610.3 mg/dL   Total Protein 7.8 6.5 - 8.1 g/dL   Albumin 3.9 3.5 - 5.0 g/dL   AST 67 (H) 15 - 41 U/L   ALT 123 (H) 14 - 54 U/L   Alkaline Phosphatase 122 38 - 126 U/L   Total Bilirubin 0.6 0.3 - 1.2 mg/dL   GFR calc non Af Amer >60 >60 mL/min   GFR calc Af Amer >60 >60 mL/min   Anion gap 12 5 - 15  Ethanol  Result Value Ref Range   Alcohol, Ethyl (B) <5 <5 mg/dL  Urinalysis, Routine w reflex microscopic  Result Value Ref Range   Color, Urine YELLOW YELLOW   APPearance HAZY (A) CLEAR   Specific Gravity, Urine 1.023 1.005 - 1.030   pH 5.0 5.0 - 8.0   Glucose, UA NEGATIVE NEGATIVE mg/dL   Hgb urine dipstick NEGATIVE NEGATIVE   Bilirubin Urine NEGATIVE NEGATIVE   Ketones, ur NEGATIVE NEGATIVE mg/dL   Protein, ur 30 (A) NEGATIVE mg/dL   Nitrite POSITIVE (A) NEGATIVE   Leukocytes, UA MODERATE (A) NEGATIVE   RBC / HPF 6-30 0 - 5 RBC/hpf   WBC, UA 6-30 0 - 5 WBC/hpf   Bacteria, UA MANY (A) NONE SEEN   Squamous Epithelial / LPF 6-30 (A) NONE SEEN   Mucous PRESENT    Hyaline Casts, UA PRESENT   Rapid urine drug screen (hospital performed)  Result Value Ref Range   Opiates POSITIVE (A) NONE DETECTED   Cocaine POSITIVE (A) NONE DETECTED   Benzodiazepines POSITIVE (A) NONE DETECTED   Amphetamines NONE DETECTED NONE DETECTED  Tetrahydrocannabinol POSITIVE (A) NONE DETECTED   Barbiturates NONE DETECTED NONE DETECTED  Salicylate level  Result Value Ref Range   Salicylate Lvl <7.0 2.8 - 30.0 mg/dL  Acetaminophen level  Result Value Ref Range   Acetaminophen (Tylenol), Serum <10 (L) 10 - 30 ug/mL  POC Urine Pregnancy, ED (do NOT order at [facility])  Result Value Ref Range   Preg Test, Ur NEGATIVE NEGATIVE   Laboratory interpretation  all normal except + UDS    EKG  EKG Interpretation None       Radiology No results found.  Procedures Procedures (including critical care time)  Medications Ordered in ED Medications  ondansetron (ZOFRAN) tablet 4 mg (not administered)  benztropine (COGENTIN) tablet 0.5 mg (not administered)  gabapentin (NEURONTIN) capsule 600 mg (not administered)  hydrOXYzine (ATARAX/VISTARIL) tablet 25 mg (not administered)  mirtazapine (REMERON) tablet 45 mg (not administered)  prazosin (MINIPRESS) capsule 2 mg (not administered)  propranolol (INDERAL) tablet 20 mg (not administered)  QUEtiapine (SEROQUEL) tablet 600 mg (not administered)     Initial Impression / Assessment and Plan / ED Course  I have reviewed the triage vital signs and the nursing notes.  Pertinent labs & imaging results that were available during my care of the patient were reviewed by me and considered in my medical decision making (see chart for details).  After reviewing her laboratory testing, psych holding orders and TTS consult was ordered. Patient's commitment papers were filled out by me.  TTS recommends psychiatrist to evaluate in the morning.   Final Clinical Impressions(s) / ED Diagnoses   Final diagnoses:  Hallucinations  Suicidal ideation    Disposition pending  Devoria Albe, MD, Concha Pyo, MD 11/25/16 [phone]

## 2016-11-25 NOTE — ED Notes (Signed)
Patient walked to the bathroom with sitter and MPD.

## 2016-11-26 LAB — URINALYSIS, ROUTINE W REFLEX MICROSCOPIC
BILIRUBIN URINE: NEGATIVE
GLUCOSE, UA: NEGATIVE mg/dL
Hgb urine dipstick: NEGATIVE
KETONES UR: NEGATIVE mg/dL
Nitrite: NEGATIVE
PH: 6 (ref 5.0–8.0)
Protein, ur: NEGATIVE mg/dL
SPECIFIC GRAVITY, URINE: 1.015 (ref 1.005–1.030)

## 2016-11-26 MED ORDER — NICOTINE 14 MG/24HR TD PT24
14.0000 mg | MEDICATED_PATCH | Freq: Once | TRANSDERMAL | Status: AC
  Administered 2016-11-26: 14 mg via TRANSDERMAL
  Filled 2016-11-26: qty 1

## 2016-11-26 NOTE — ED Notes (Signed)
Pt asked sitter where door located beside her room led too. Pt transferred to Rm 15

## 2016-11-26 NOTE — ED Notes (Signed)
Pt requesting to use phone at this time

## 2016-11-26 NOTE — ED Notes (Signed)
Pt requesting meds for withdrawl

## 2016-11-26 NOTE — ED Notes (Signed)
Pt re evaluated by [facility] 

## 2016-11-26 NOTE — ED Notes (Signed)
Pt reports she feels like she has a UTI. Requesting a urine be sent for testing

## 2016-11-26 NOTE — ED Notes (Signed)
Lights turned down for comfort. Pt watching tv and resting at this time.

## 2016-11-26 NOTE — BH Assessment (Addendum)
Pt denies SI and HI. [facility]. Pt becomes somewhat irritable. She says, "Just people" when asked re: [facility]. Pt says she hears and see them. She reports NVD, stomach ache and HA. Pt asks writer if pt can have something for withdrawals. She says suboxone or subutex usually help. Writer explained that those meds are not given in the ED.   Evette Cristalaroline Paige Becky Berberian, [place] Therapeutic Triage Specialist

## 2016-11-27 MED ORDER — NICOTINE 14 MG/24HR TD PT24
14.0000 mg | MEDICATED_PATCH | Freq: Once | TRANSDERMAL | Status: AC
  Administered 2016-11-27: 14 mg via TRANSDERMAL
  Filled 2016-11-27: qty 1

## 2016-11-27 NOTE — ED Notes (Signed)
Dinner tray given to pt

## 2016-11-27 NOTE — ED Notes (Signed)
Patient is on the [name] waitlist per intake Vonna KotykJay on 7/15. [facility]  Patient's referral has been followed up at the following inpatient [facility] facilities: [facility]. [facility]  [facility] [address] [facility], [facility], [address] [facility], [facility], [facility], [facility], [facility], [facility].  CSW in disposition will continue to seek placement.

## 2016-11-27 NOTE — ED Notes (Signed)
Pt escorted to shower by sitter and security.  

## 2016-11-27 NOTE — BH Assessment (Signed)
TTS spoke with pt on this date to reevaluate.  Pt said she was feelling "ill".  TTS asked if she was feeling physically ill and she said no, "just ill", appearing to mean "in a bad mood."  Pt said she was brought to the ED because she was "hearing stuff."  TTS asked if she was still hearing things and she said she was.  TTS asked what she was hearing and she then said, "I'm fine" and would not elaborate further.  Pt denied and SI/HI.  Pt denied any visual hallucinations.  Pt was cooperative but somewhat irritable.  TTS continues to see inpt placement. Garner NashGregory Keiondra [place], MSW, LCSW Clinical Social Worker 11/27/2016 2:42 PM

## 2016-11-27 NOTE — ED Notes (Signed)
Pt did not want vitals taken at this time. Asked if I could cpme back latter,

## 2016-11-27 NOTE — Progress Notes (Addendum)
[facility] intake Vonna KotykJay on 7/15. [facility].  Patient's referral has been followed up at the following inpatient treatment facilities: [facility]. [facility] - [facility] answer  [facility]: [address], [facility], [address] [facility] [place], [facility], [facility], [facility], [facility], [facility].  CSW in disposition will continue to seek placement.  Melbourne Abtsatia Hisham Provence, LCSWA Disposition staff 11/27/2016 10:58 AM

## 2016-11-28 NOTE — ED Notes (Signed)
Pt up to restroom with sitter. Pt c/o a stomach ache.

## 2016-11-28 NOTE — ED Notes (Signed)
Old nicotine patch removed and properly disposed off prior to pt leaving ED.

## 2016-11-28 NOTE — ED Notes (Signed)
telepsych in progress 

## 2016-11-28 NOTE — Discharge Instructions (Signed)
Follow up with resources    

## 2016-11-28 NOTE — [facility] Counselor (Signed)
TTS Reassessment:  Pt was irritable but cooperative. She states that she doesn't want to go inpatient because going for "3 or4" days isn't enough for her. She states that her main issue is substance abuse and "when she uses amphetimines she gets psychotic". She states that [facility] but she missed it because she was in the hospital. Pt states that she can "find a treatment center herself" and does not want to wait in the ED anymore. Pt denies SI, HI or AVH and was coherent and oriented during reassessment. Pt does not meet inpatient criteria per Cherre Robinsina O. NP and can be discharged with resources for substance abuse treatment. Writer faxed over resources to RN and starred the facilites that will take patients that do not have insurance. EDP made aware of disposition.  [address]rist[address] Zahraa [name] CherryvaleLPC, LCAS

## 2016-11-28 NOTE — ED Notes (Signed)
[facility] called and reported pt to be discharged with outpatient resources. [facility] reported would fax over outpatient referral resources and contact EDP concerning disposition. Pt aware.

## 2016-11-28 NOTE — ED Notes (Signed)
Received faxed referrals. Given to patient. Pt calling ride. Pt belongings given to patient. Pt aware waiting for discharge instructions.

## 2016-12-10 ENCOUNTER — Other Ambulatory Visit (HOSPITAL_COMMUNITY): Payer: Self-pay | Admitting: Psychiatry

## 2017-02-10 ENCOUNTER — Encounter (HOSPITAL_COMMUNITY): Payer: Self-pay

## 2017-02-10 ENCOUNTER — Emergency Department (HOSPITAL_COMMUNITY)
Admission: EM | Admit: 2017-02-10 | Discharge: 2017-02-11 | Disposition: A | Discharge status: Other Acute Inpatient Hospital | Payer: Self-pay | Attending: Emergency Medicine | Admitting: Emergency Medicine

## 2017-02-10 DIAGNOSIS — R7989 Other specified abnormal findings of blood chemistry: Secondary | ICD-10-CM | POA: Insufficient documentation

## 2017-02-10 DIAGNOSIS — Z9114 Patient's other noncompliance with medication regimen: Secondary | ICD-10-CM | POA: Insufficient documentation

## 2017-02-10 DIAGNOSIS — F17210 Nicotine dependence, cigarettes, uncomplicated: Secondary | ICD-10-CM | POA: Insufficient documentation

## 2017-02-10 DIAGNOSIS — Z046 Encounter for general psychiatric examination, requested by authority: Secondary | ICD-10-CM | POA: Insufficient documentation

## 2017-02-10 DIAGNOSIS — R945 Abnormal results of liver function studies: Secondary | ICD-10-CM

## 2017-02-10 DIAGNOSIS — Z79899 Other long term (current) drug therapy: Secondary | ICD-10-CM | POA: Insufficient documentation

## 2017-02-10 DIAGNOSIS — F419 Anxiety disorder, unspecified: Secondary | ICD-10-CM | POA: Insufficient documentation

## 2017-02-10 DIAGNOSIS — F22 Delusional disorders: Secondary | ICD-10-CM | POA: Insufficient documentation

## 2017-02-10 LAB — CBC WITH DIFFERENTIAL/PLATELET
BASOS ABS: 0.1 10*3/uL (ref 0.0–0.1)
BASOS PCT: 1 %
EOS ABS: 0.6 10*3/uL (ref 0.0–0.7)
Eosinophils Relative: 6 %
HEMATOCRIT: 38 % (ref 36.0–46.0)
HEMOGLOBIN: 12.9 g/dL (ref 12.0–15.0)
Lymphocytes Relative: 32 %
Lymphs Abs: 3.1 10*3/uL (ref 0.7–4.0)
MCH: 30.1 pg (ref 26.0–34.0)
MCHC: 33.9 g/dL (ref 30.0–36.0)
MCV: 88.6 fL (ref 78.0–100.0)
Monocytes Absolute: 0.9 10*3/uL (ref 0.1–1.0)
Monocytes Relative: 9 %
NEUTROS ABS: 5.2 10*3/uL (ref 1.7–7.7)
NEUTROS PCT: 52 %
Platelets: 333 10*3/uL (ref 150–400)
RBC: 4.29 MIL/uL (ref 3.87–5.11)
RDW: 13.9 % (ref 11.5–15.5)
WBC: 9.8 10*3/uL (ref 4.0–10.5)

## 2017-02-10 LAB — CARBAMAZEPINE LEVEL, TOTAL: Carbamazepine Lvl: <2 ug/mL — ABNORMAL LOW (ref 4.0–12.0)

## 2017-02-10 LAB — COMPREHENSIVE METABOLIC PANEL
ALBUMIN: 3.9 g/dL (ref 3.5–5.0)
ALK PHOS: 136 U/L — AB (ref 38–126)
ALT: 192 U/L — ABNORMAL HIGH (ref 14–54)
ANION GAP: 10 (ref 5–15)
AST: 135 U/L — AB (ref 15–41)
BILIRUBIN TOTAL: 0.7 mg/dL (ref 0.3–1.2)
BUN: 17 mg/dL (ref 6–20)
CO2: 23 mmol/L (ref 22–32)
Calcium: 9 mg/dL (ref 8.9–10.3)
Chloride: 102 mmol/L (ref 101–111)
Creatinine, Ser: 0.65 mg/dL (ref 0.44–1.00)
GFR calc Af Amer: >60 mL/min (ref >60)
GFR calc non Af Amer: >60 mL/min (ref >60)
GLUCOSE: 125 mg/dL — AB (ref 65–99)
POTASSIUM: 3.7 mmol/L (ref 3.5–5.1)
SODIUM: 135 mmol/L (ref 135–145)
TOTAL PROTEIN: 7.9 g/dL (ref 6.5–8.1)

## 2017-02-10 LAB — PREGNANCY, URINE: PREG TEST UR: NEGATIVE

## 2017-02-10 LAB — RAPID URINE DRUG SCREEN, HOSP PERFORMED
AMPHETAMINES: NOT DETECTED
BENZODIAZEPINES: POSITIVE — AB
Barbiturates: NOT DETECTED
COCAINE: NOT DETECTED
Opiates: NOT DETECTED
Tetrahydrocannabinol: POSITIVE — AB

## 2017-02-10 LAB — URINALYSIS, ROUTINE W REFLEX MICROSCOPIC
Bilirubin Urine: NEGATIVE
GLUCOSE, UA: NEGATIVE mg/dL
HGB URINE DIPSTICK: NEGATIVE
Ketones, ur: NEGATIVE mg/dL
LEUKOCYTES UA: NEGATIVE
Nitrite: NEGATIVE
PH: 6 (ref 5.0–8.0)
Protein, ur: NEGATIVE mg/dL
SPECIFIC GRAVITY, URINE: 1.015 (ref 1.005–1.030)

## 2017-02-10 LAB — ETHANOL: Alcohol, Ethyl (B): <10 mg/dL (ref <10)

## 2017-02-10 LAB — ACETAMINOPHEN LEVEL

## 2017-02-10 LAB — SALICYLATE LEVEL

## 2017-02-10 LAB — LIPASE, BLOOD: Lipase: 19 U/L (ref 11–51)

## 2017-02-10 MED ORDER — BENZTROPINE MESYLATE 1 MG PO TABS
0.5000 mg | ORAL_TABLET | Freq: Every day | ORAL | Status: DC
  Administered 2017-02-10: 0.5 mg via ORAL
  Filled 2017-02-10: qty 1

## 2017-02-10 MED ORDER — GABAPENTIN 300 MG PO CAPS
600.0000 mg | ORAL_CAPSULE | Freq: Three times a day (TID) | ORAL | Status: DC
  Administered 2017-02-10 (×3): 600 mg via ORAL
  Filled 2017-02-10 (×3): qty 2

## 2017-02-10 MED ORDER — LORAZEPAM 1 MG PO TABS
1.0000 mg | ORAL_TABLET | Freq: Once | ORAL | Status: AC
  Administered 2017-02-10: 1 mg via ORAL
  Filled 2017-02-10: qty 1

## 2017-02-10 MED ORDER — SERTRALINE HCL 50 MG PO TABS
50.0000 mg | ORAL_TABLET | Freq: Every day | ORAL | Status: DC
  Administered 2017-02-10: 50 mg via ORAL
  Filled 2017-02-10 (×2): qty 1

## 2017-02-10 MED ORDER — PROPRANOLOL HCL 10 MG PO TABS
20.0000 mg | ORAL_TABLET | Freq: Three times a day (TID) | ORAL | Status: DC
  Administered 2017-02-10 (×2): 20 mg via ORAL
  Filled 2017-02-10 (×3): qty 2

## 2017-02-10 MED ORDER — PRAZOSIN HCL 1 MG PO CAPS
2.0000 mg | ORAL_CAPSULE | Freq: Every day | ORAL | Status: DC
  Administered 2017-02-10: 2 mg via ORAL
  Filled 2017-02-10 (×3): qty 2

## 2017-02-10 MED ORDER — CARBAMAZEPINE 200 MG PO TABS
ORAL_TABLET | ORAL | Status: AC
  Filled 2017-02-10: qty 1

## 2017-02-10 MED ORDER — HYDROXYZINE HCL 25 MG PO TABS
25.0000 mg | ORAL_TABLET | Freq: Three times a day (TID) | ORAL | Status: DC | PRN
  Administered 2017-02-10 – 2017-02-11 (×2): 25 mg via ORAL
  Filled 2017-02-10 (×2): qty 1

## 2017-02-10 MED ORDER — CARBAMAZEPINE 200 MG PO TABS
200.0000 mg | ORAL_TABLET | Freq: Three times a day (TID) | ORAL | Status: DC
  Administered 2017-02-10 (×3): 200 mg via ORAL
  Filled 2017-02-10 (×4): qty 1

## 2017-02-10 MED ORDER — LEVOTHYROXINE SODIUM 50 MCG PO TABS
50.0000 ug | ORAL_TABLET | Freq: Every day | ORAL | Status: DC
  Administered 2017-02-10 – 2017-02-11 (×2): 50 ug via ORAL
  Filled 2017-02-10 (×2): qty 1

## 2017-02-10 MED ORDER — QUETIAPINE FUMARATE 100 MG PO TABS
600.0000 mg | ORAL_TABLET | Freq: Every day | ORAL | Status: DC
  Administered 2017-02-10: 600 mg via ORAL
  Filled 2017-02-10: qty 6

## 2017-02-10 MED ORDER — MIRTAZAPINE 15 MG PO TABS
45.0000 mg | ORAL_TABLET | Freq: Every day | ORAL | Status: DC
  Administered 2017-02-10: 45 mg via ORAL
  Filled 2017-02-10 (×3): qty 3

## 2017-02-10 NOTE — Progress Notes (Signed)
Patient has been recommended inpatient treatment and is being referred to the following facilities: [facility], [facility] [address], [place], [facility] [place] [facility], [employer], and [facility].  CSW in disposition will continue to seek placement.  Melbourne Abts, MSW, LCSWA Clinical social worker in disposition [place], TTS Office [phone] and [phone] 02/10/2017 6:44 PM

## 2017-02-10 NOTE — ED Triage Notes (Signed)
Pt states to this nurse that she awoke this morning and was feeling funny, states she walked several houses to her parents house and told them she was feeling funny and they called ems.  Pt states she smoked marijuana last night before going to bed.  Pt is calm and cooperative and denies SI/HI.

## 2017-02-10 NOTE — ED Provider Notes (Signed)
[facility]-EMERGENCY DEPT Provider Note   CSN: [id number] Arrival date & time: 02/10/17  0449     History   Chief Complaint Chief Complaint  Patient presents with  . Medical Clearance    HPI Becky [name] is a 26 y.o. female.  Level V caveat for psychiatric illness. Patient brought in by EMS with paranoia by report. She was given Haldol prior to arrival. Patient told nursing that she woke up feeling funny this morning and walked to her parents past where they called EMS. Patient admits to smoking marijuana last night which is not out of the ordinary. Patient states she's been out of her psychiatric medications for several days. denies any pain. She is oriented to person and place and time. She denies any suicidal or homicidal thoughts. She admits to using alcohol and marijuana last night. Denies any hallucinations.   The history is provided by the patient and the EMS personnel. The history is limited by the condition of the patient.    Past Medical History:  Diagnosis Date  . Bipolar 1 disorder (HCC)   . Bronchitis   . GAD (generalized anxiety disorder)   . Hepatitis C antibody test positive 06/17/2015  . History of substance abuse 06/16/2015  . OCD (obsessive compulsive disorder)   . Peptic ulcer   . Polysubstance abuse   . PTSD (post-traumatic stress disorder)     Patient Active Problem List   Diagnosis Date Noted  . Hallucinations   . Bipolar disorder, curr episode mixed, severe, with psychotic features (HCC) 11/02/2016  . PTSD (post-traumatic stress disorder) 11/02/2016  . GAD (generalized anxiety disorder) 10/16/2016  . Opioid use disorder, moderate, dependence (HCC) 06/06/2016  . 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 Cocaine use disorder, severe, dependence (HCC) 06/06/2016  . Cannabis use disorder, severe, dependence (HCC) 06/06/2016  . MDD (major depressive disorder), recurrent severe, without psychosis (HCC)  06/03/2016  . Bipolar I disorder, most recent episode depressed (HCC) 07/04/2015  . Overdose 07/02/2015  . Lactic acidosis 07/02/2015  . Acute respiratory failure (HCC) 07/02/2015  . Sepsis (HCC) 07/02/2015  . AKI (acute kidney injury) (HCC)   . Altered mental status   . Pyrexia   . Abdominal pain, epigastric 06/17/2015  . Hepatitis C antibody test positive 06/17/2015  . Peptic ulcer disease 06/17/2015  . Epigastric pain   . Transaminitis 06/16/2015  . History of substance abuse 06/16/2015    Past Surgical History:  Procedure Laterality Date  . OTHER SURGICAL HISTORY     scar tissue removed from right ovary   . OTHER SURGICAL HISTORY     fallopian tube repair  . WISDOM TOOTH EXTRACTION      OB History    Gravida Para Term Preterm AB Living   SAB TAB Ectopic Multiple Live Births                   Home Medications    Prior to Admission medications   Medication Sig Start Date End Date Taking? Authorizing Provider  benztropine (COGENTIN) 0.5 MG tablet Take 1 tablet (0.5 mg total) by mouth at bedtime. For prevention of drug induced tremors. 11/09/16   Armandina Stammer I, NP  carbamazepine (TEGRETOL) 200 MG tablet take 1  Tablet by mouth 3 times daily FOR mood stabilization 10/21/16   [provider]  clotrimazole (GYNE-LOTRIMIN) 1 % vaginal cream Place 1 Applicatorful vaginally at bedtime. For Vaginal  yeast infection Patient not taking: Reported on 11/25/2016 11/08/16   Armandina Stammer I, NP  gabapentin (NEURONTIN) 300 MG capsule Take 2 capsules (600 mg total) by mouth 3 (three) times daily. For agitation 11/08/16   Armandina Stammer I, NP  hydrOXYzine (ATARAX/VISTARIL) 25 MG tablet Take 1 tablet (25 mg) three times daily as needed: For anxiety Patient not taking: Reported on 11/25/2016 11/08/16   Armandina Stammer I, NP  levothyroxine (SYNTHROID, LEVOTHROID) 50 MCG tablet Take 1 tablet (50 mcg total) by mouth daily before breakfast. For low thyroid Hormone 11/08/16   Armandina Stammer I, NP  mirtazapine (REMERON) 45 MG tablet Take 1 tablet (45 mg total) by mouth at bedtime. For depression/sleep Patient taking differently: Take 90 mg by mouth at bedtime. For depression/sleep 11/08/16   Armandina Stammer I, NP  nicotine polacrilex (NICORETTE) 2 MG gum Take 1 each (2 mg total) by mouth as needed for smoking cessation. 11/08/16   Armandina Stammer I, NP  prazosin (MINIPRESS) 2 MG capsule Take 1 capsule (2 mg total) by mouth at bedtime. For nightmares 11/08/16   Armandina Stammer I, NP  propranolol (INDERAL) 20 MG tablet Take 1 tablet (20 mg total) by mouth 3 (three) times daily. For anxiety 11/08/16   Armandina Stammer I, NP  QUEtiapine (SEROQUEL) 300 MG tablet Take 2 tablets (600 mg total) by mouth at bedtime. For mood control Patient not taking: Reported on 11/25/2016 11/08/16   Armandina Stammer I, NP  sertraline (ZOLOFT) 50 MG tablet Take 1 Tablet by mouth once daily FOR depression 10/21/16   [provider]    Family History Family History  Problem Relation Age of Onset  . Drug abuse Brother   . Suicidality Brother   . Cancer Other   . Depression Mother   . Drug abuse Mother   . Alcoholism Father   . Bipolar disorder Cousin     Social History Social History  Substance Use Topics  . Smoking status: Current Every Day Smoker    Packs/day: 1.00    Years: 10.00    Types: Cigarettes  . Smokeless tobacco: Never Used  . Alcohol use Yes     Comment: occ     Allergies   Azithromycin and Nsaids   Review of Systems Review of Systems  Constitutional: Negative for activity change, appetite change and fatigue.  HENT: Negative for congestion and rhinorrhea.   Respiratory: Negative for cough, chest tightness and shortness of breath.   Cardiovascular: Negative for chest pain.  Gastrointestinal: Negative for abdominal pain, nausea and vomiting.  Genitourinary: Negative for dysuria, hematuria, vaginal bleeding and vaginal discharge.  Musculoskeletal: Negative for arthralgias and  myalgias.  Skin: Negative for rash.  Neurological: Negative for dizziness, tremors, syncope, light-headedness and headaches.  Psychiatric/Behavioral: Positive for agitation. The patient is nervous/anxious.    all other systems are negative except as noted in the HPI and PMH.     Physical Exam Updated Vital Signs BP 133/86 (BP Location: Left Arm)   Pulse 89   Temp 98.1 F (36.7 C) (Oral)   Resp 16   SpO2 98%   Physical Exam  Constitutional: She is oriented to person, place, and time. She appears well-developed and well-nourished. No distress.  Anxious appearing  HENT:  Head: Normocephalic and atraumatic.  Mouth/Throat: Oropharynx is clear and moist. No oropharyngeal exudate.  Eyes: Pupils are equal, round, and reactive to light. Conjunctivae and EOM are normal.  Neck: Normal range of motion. Neck supple.  No meningismus.  Cardiovascular:  Normal rate, regular rhythm, normal heart sounds and intact distal pulses.   No murmur heard. Pulmonary/Chest: Effort normal and breath sounds normal. No respiratory distress.  Abdominal: Soft. There is no tenderness. There is no rebound and no guarding.  Musculoskeletal: Normal range of motion. She exhibits no edema or tenderness.  Neurological: She is alert and oriented to person, place, and time. No cranial nerve deficit. She exhibits normal muscle tone. Coordination normal.   5/5 strength throughout. CN 2-12 intact.Equal grip strength.   Skin: Skin is warm. Capillary refill takes less than 2 seconds.  Psychiatric: She has a normal mood and affect. Her behavior is normal.  Nursing note and vitals reviewed.    ED Treatments / Results  Labs (all labs ordered are listed, but only abnormal results are displayed) Labs Reviewed  COMPREHENSIVE METABOLIC PANEL - Abnormal; Notable for the following:       Result Value   Glucose, Bld 125 (*)    AST 135 (*)    ALT 192 (*)    Alkaline Phosphatase 136 (*)    All other components within normal  limits  ACETAMINOPHEN LEVEL - Abnormal; Notable for the following:    Acetaminophen (Tylenol), Serum <10 (*)    All other components within normal limits  RAPID URINE DRUG SCREEN, HOSP PERFORMED - Abnormal; Notable for the following:    Benzodiazepines POSITIVE (*)    Tetrahydrocannabinol POSITIVE (*)    All other components within normal limits  CARBAMAZEPINE LEVEL, TOTAL - Abnormal; Notable for the following:    Carbamazepine Lvl <2.0 (*)    All other components within normal limits  CBC WITH DIFFERENTIAL/PLATELET  LIPASE, BLOOD  ETHANOL  SALICYLATE LEVEL  PREGNANCY, URINE  URINALYSIS, ROUTINE W REFLEX MICROSCOPIC    EKG  EKG Interpretation None       Radiology No results found.  Procedures Procedures (including critical care time)  Medications Ordered in ED Medications  LORazepam (ATIVAN) tablet 1 mg (not administered)     Initial Impression / Assessment and Plan / ED Course  I have reviewed the triage vital signs and the nursing notes.  Pertinent labs & imaging results that were available during my care of the patient were reviewed by me and considered in my medical decision making (see chart for details).    Patient presents by EMS with paranoid behavior and feeling funny after smoking marijuana. Denies any homicidal thoughts or suicidal thoughts.  Patient is oriented 3. She is in no distress. She denies any somatic complaints. Her screening labs are obtained and remarkable for mild elevation of her transaminases. She does have a history of hepatitis C. Denies any abdominal pain or vomiting.   TTS consult obtained and further observation recommended with repeat evaluation later today.  Final Clinical Impressions(s) / ED Diagnoses   Final diagnoses:  Paranoia (HCC)  Elevated LFTs    New Prescriptions New Prescriptions   No medications on file     Glynn Octave, MD 02/10/17 [phone]

## 2017-02-10 NOTE — ED Notes (Signed)
Out of bed to [facility]  Has asked when lunch will be provided

## 2017-02-10 NOTE — ED Notes (Signed)
Awaiting eval/reeval by [facility]

## 2017-02-10 NOTE — ED Notes (Signed)
Call from [facility] pt meets inpatient criteria

## 2017-02-10 NOTE — ED Notes (Signed)
Pt mtoher called to check on pt, I advised mother I would let her speak with pt. Pt then comes to me a few minutes later and says she wants to be IVC'd. I asked why. Pt hesitated and then said "because I feel like someone is out to kill me and this is the safest place for me". edp aware.

## 2017-02-10 NOTE — Consult Note (Signed)
  Patient states that she is feeling funny and that she feels that she needs to take her life before her parents has to take her life for her.  States that suicidal thoughts with plan to cut wrist and unable to contract for safety.  History of prior suicide attempt and recent psychiatric hospitalization.  States that she is hearing voices telling her that they are going to kill her and her family.  States that she is all so seeing people but hard to explain.  Also having nightmares and when wake up feels that she is still in nightmare. "Nobody else see what I see or hear what I hear."  Recommendation inpatient psychiatric treatment.

## 2017-02-10 NOTE — Progress Notes (Addendum)
Per Assunta Found, NP at [facility], patient meets inpatient treatment criteria. Patient's case was discussed with [facility] psychiatry team. No beds available at [facility] at this moment.  This Clinical research associate to refer patient to inpatient treatment at out of system facilities.  [facility]-ED RN Dahlia Client was informed.  Melbourne Abts, MSW, LCSWA Clinical social worker in disposition [facility], TTS Office [phone] and [phone] 02/10/2017 6:17 PM

## 2017-02-10 NOTE — ED Notes (Signed)
Caller who identified as pt mother   Early Chars,   [phone]

## 2017-02-10 NOTE — ED Notes (Signed)
Meal provided 

## 2017-02-10 NOTE — ED Notes (Signed)
Pt request to call her mother as her mothers number has changed and she does not have current number  Awaiting TTS eval/reeval

## 2017-02-10 NOTE — ED Notes (Signed)
Pt reports visual hallucinations with description of shadow in front of her and feeling something coming from the floor  Reassured that she is in a safe place and encouraged to rest
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
## 2017-02-10 NOTE — BHH Counselor (Signed)
Per Nira Conn, NP: continue to observe, due to Patient's slurred speech, history of psychosis.  TTS to re-assess later in the day.  [facility]-EDP, Rancour, MD notified of disposition at [phone].

## 2017-02-10 NOTE — ED Notes (Signed)
Awaiting eval/reeval

## 2017-02-10 NOTE — ED Notes (Signed)
Pt to [facility]   Ambulates heel to toe without stagger or drift

## 2017-02-10 NOTE — ED Notes (Signed)
Complains of hunger, meal provided

## 2017-02-10 NOTE — ED Notes (Signed)
Pt asked if she was si/hi. Pt states "I am if it gets me somewhere". Advised pt that I need to know how she feels and not what she needs to say to get somewhere. Pt then states " Yes, Yes I am, Im just so crazy in my head". EDP aware

## 2017-02-10 NOTE — ED Triage Notes (Signed)
Per EMS pt states she feels like there are bugs in her belly and she is paranoid. Pt given  Haldol en route. Pt calm at this time but anxious.

## 2017-02-10 NOTE — ED Notes (Signed)
Pt placed in paper scrubs. Clothes and jewelry placed in locker. She was wanded  By security.

## 2017-02-10 NOTE — ED Notes (Signed)
Pt with multiple request for soft drinks- having had multiple soft drinks today (6or more) Declined to give further soft drinks with education regarding restorative sleep, its' effects on serotonin, nor epi, and dopamine

## 2017-02-10 NOTE — ED Notes (Signed)
Pt reports that   "I told them I wanted to go home, but I don't think I should go"  When asked if she would be safe, pt replies that she does not think so   Call to TTS who advises to tell pt to tell NP when called for other assessment

## 2017-02-10 NOTE — ED Notes (Signed)
Pt reports that she is on suboxone from her neurologist and wonders when she will receive- pt is educated that NP from [facility] will eval and meds will be determined after eval

## 2017-02-10 NOTE — ED Notes (Signed)
Pt reports that she is confused and mentally exhausted=  She has been speaking on the phone and appears both coherent and appropriate  She has asked several times if she has been IVC'd yet

## 2017-02-11 NOTE — ED Notes (Addendum)
Pt up to the bathroom, pt has been up and down to bathroom all night and requesting something to drink and something for her hallucinations; pt offered water to drink and she refused, requesting sprite; pt informed she would have to wait until breakfast for her to have anything to drink

## 2017-02-11 NOTE — BH Assessment (Addendum)
[facility] Assessment Progress Note   Patient accepted to [facility].  Attending psychiatrist will be Dr. Suzie Portela.  Going to room number 504-A.  Nurse call report to [phone].  Pt can come anytime after 08:00.

## 2017-02-11 NOTE — ED Notes (Signed)
Pt given breakfast meal tray, pt ate 100% of tray

## 2017-02-11 NOTE — ED Notes (Signed)
PT transferred to [facility]

## 2017-02-11 NOTE — ED Notes (Signed)
Pt given phone to call family and let them know where she will be going, pt made aware that she is allowed 5 minutes on the phone.

## 2017-02-11 NOTE — ED Provider Notes (Signed)
7:06 AM Patient accepted to [facility]. Accepting physician is Dr. Suzie Portela. Patient made aware, ok for transport. HDS.    Pricilla Loveless, MD 02/11/17 [phone]

## 2017-02-27 IMAGING — CT CT HEAD W/O CM
1 of 2 series · 16 of 30 positions shown, 20 images · non-contrast
Comparison: None.

CLINICAL DATA: Altered mental status

EXAM:
CT HEAD WITHOUT CONTRAST
TECHNIQUE: Contiguous axial images were obtained from the base of the skull
through the vertex without intravenous contrast.

[Series 3: headtrauma 2.4 h60s · axial · 0.47mm/px · z∈[+62,+222]mm · 16 of 72 slices shown, 20 images]
[im 4/72  brain]
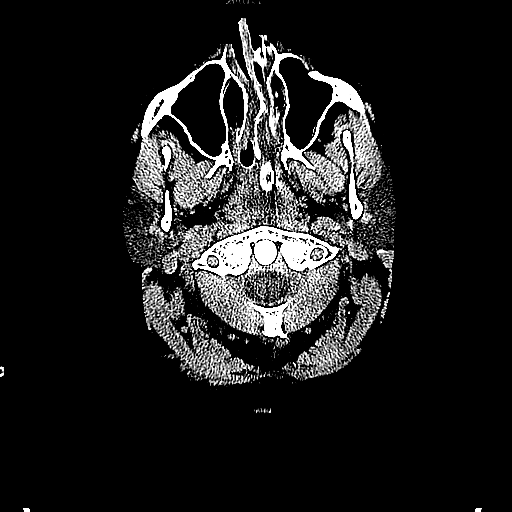
[im 4/72  bone]
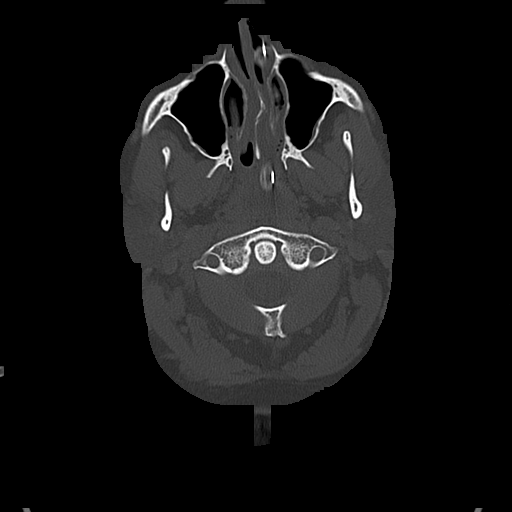
[im 8/72  brain]
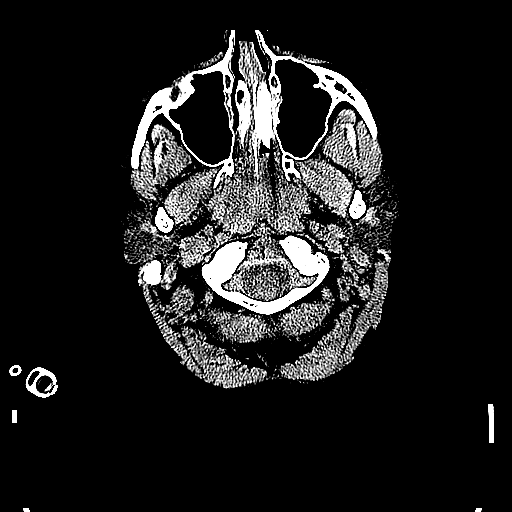
[im 12/72  brain]
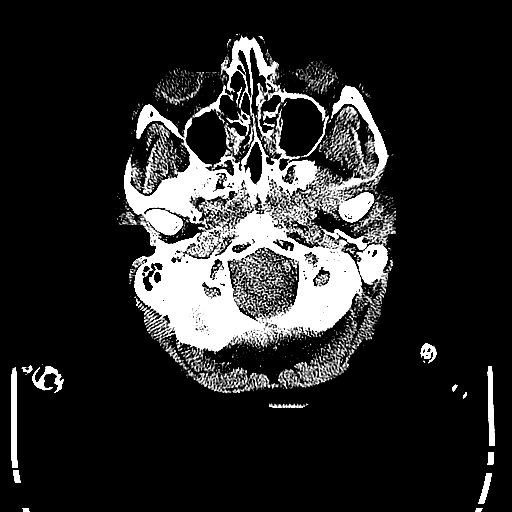
[im 15/72  brain]
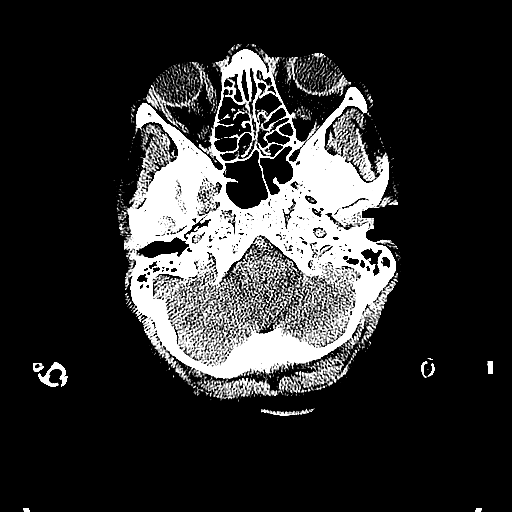
[im 23/72  brain]
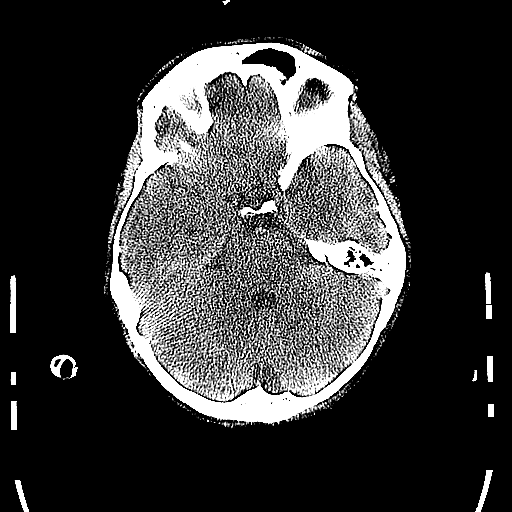
[im 23/72  bone]
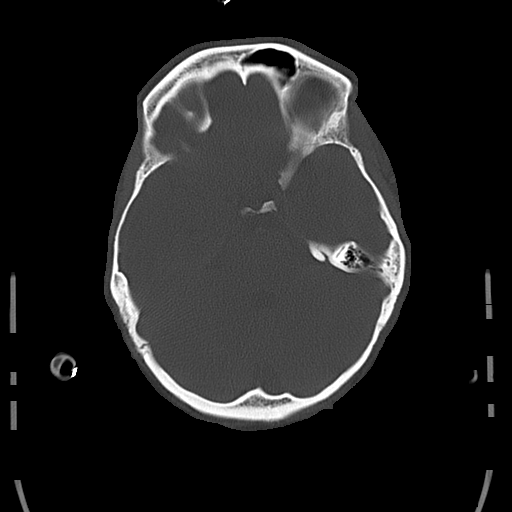
[im 27/72  brain]
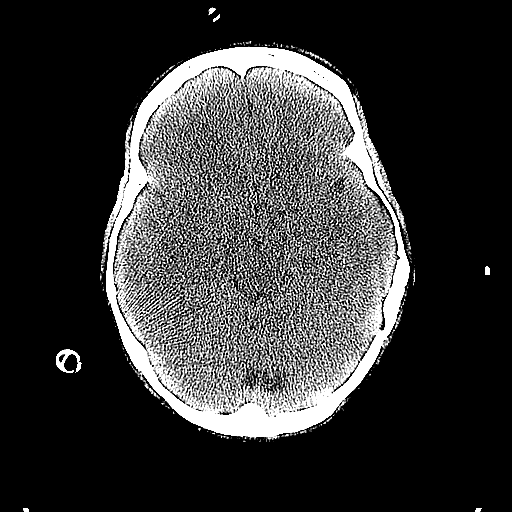
[im 30/72  brain]
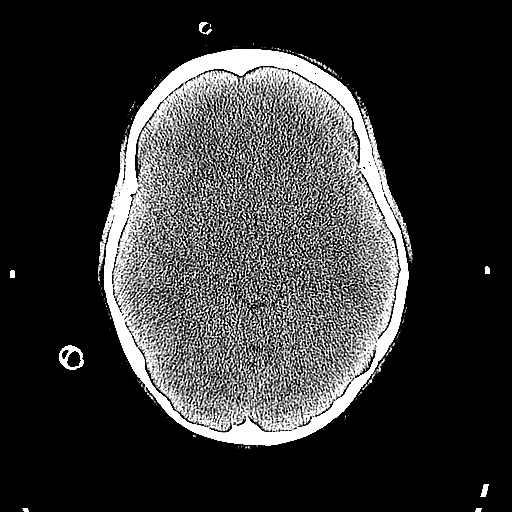
[im 34/72  brain]
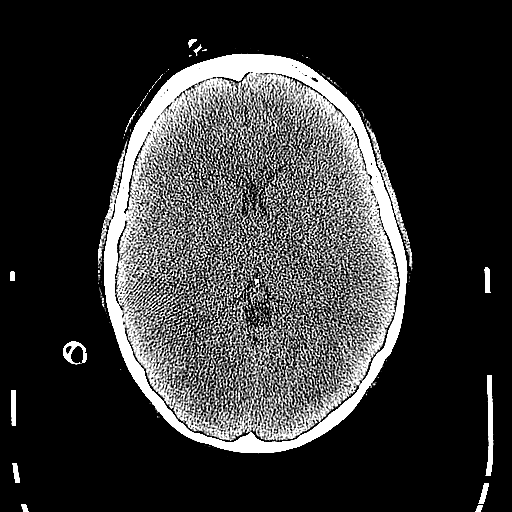
[im 38/72  brain]
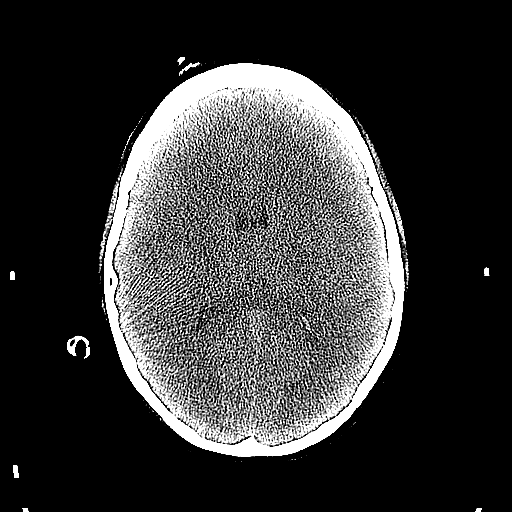
[im 38/72  bone]
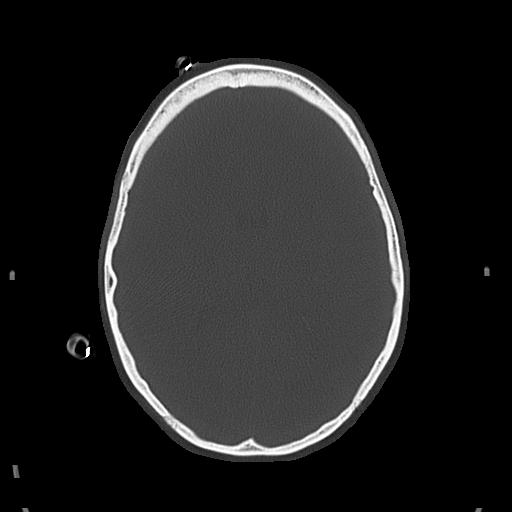
[im 42/72  brain]
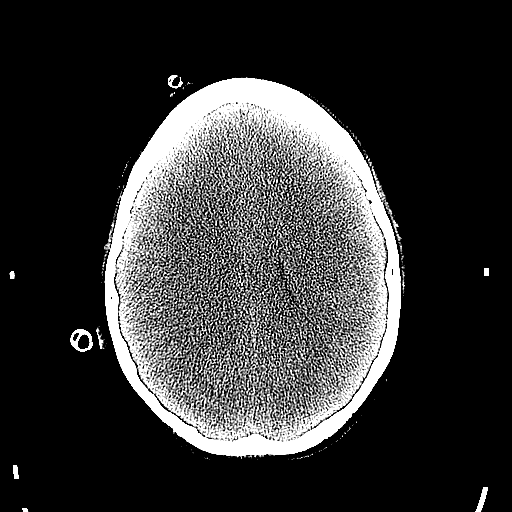
[im 45/72  brain]
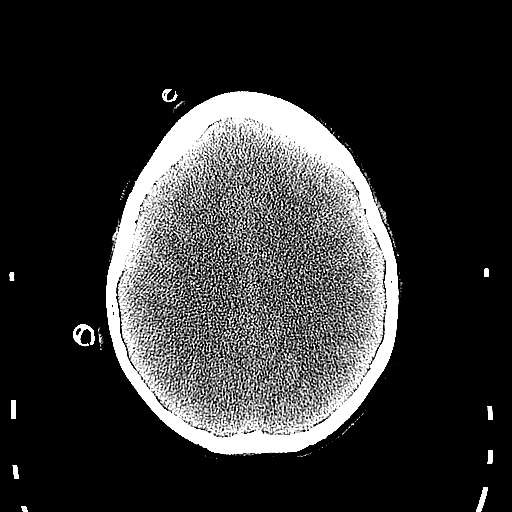
[im 49/72  brain]
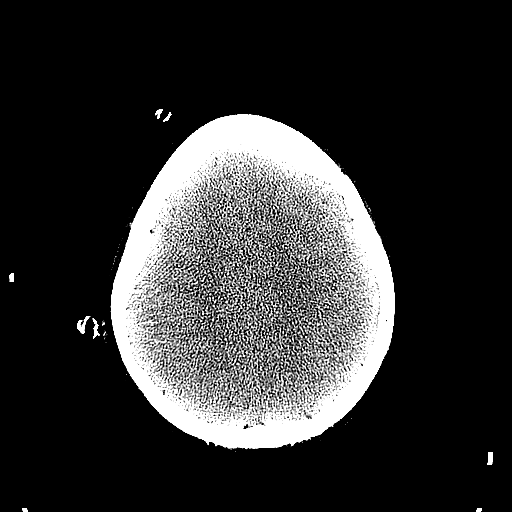
[im 57/72  brain]
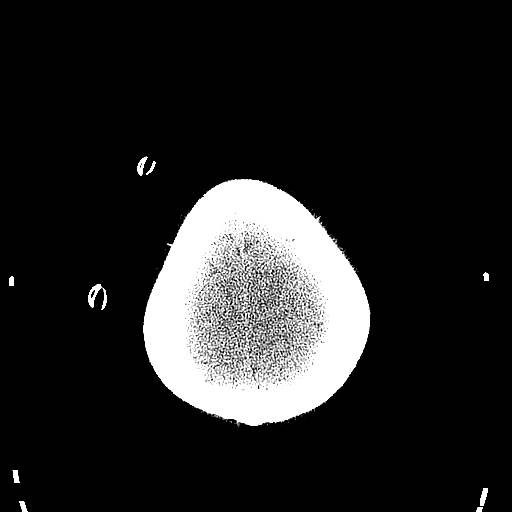
[im 57/72  bone]
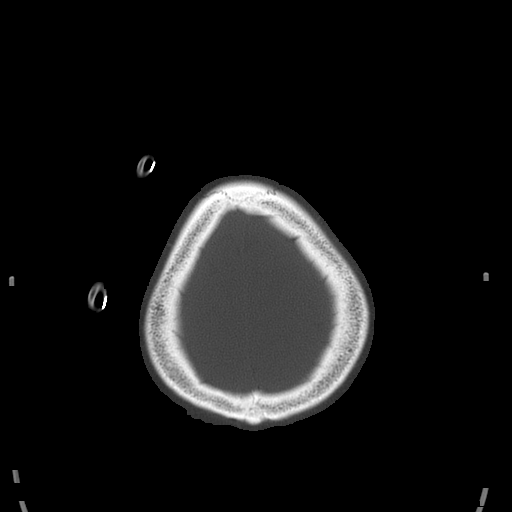
[im 60/72  brain]
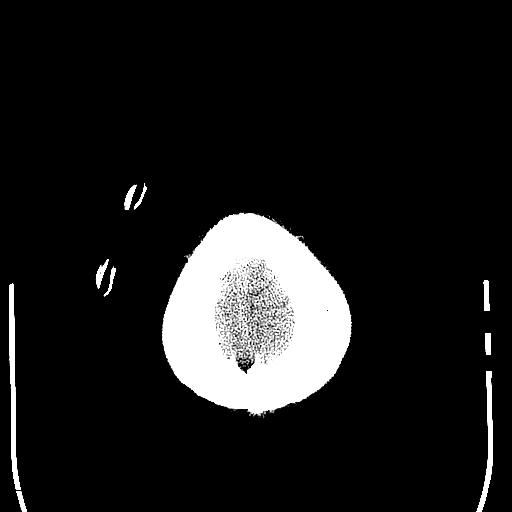
[im 64/72  brain]
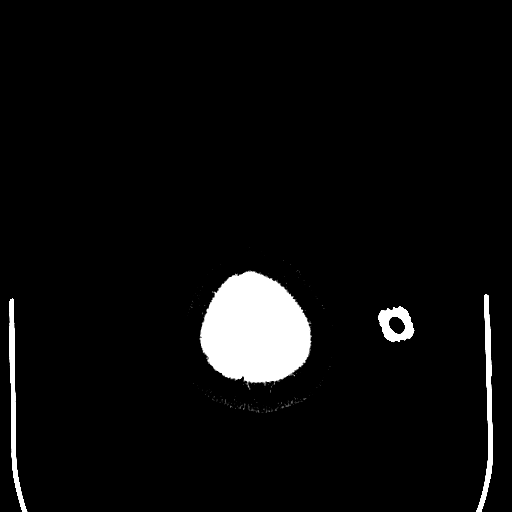
[im 68/72  brain]
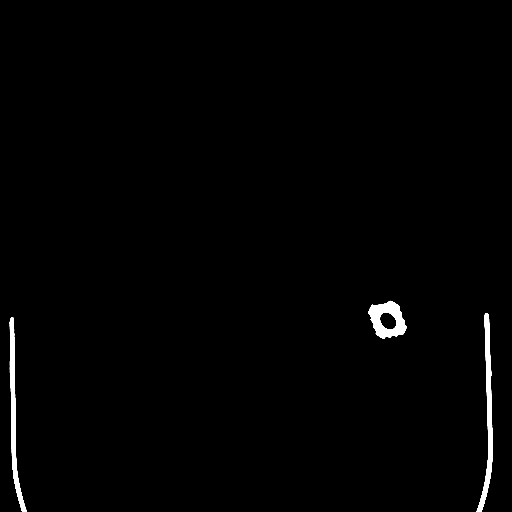

[16 of 30 positions shown; findings below may reference images not displayed]

FINDINGS: Skull and Sinuses:Negative for fracture. Nasal cavity and
nasopharynx fluid in the setting of intubation.

Visualized orbits: Negative.

Brain: Unremarkable. No evidence of acute infarction, hemorrhage,
hydrocephalus, or mass lesion/mass effect.
IMPRESSION: Negative head CT.

## 2017-02-28 IMAGING — CR DG CHEST 1V PORT
1 series · 1 of 1 positions shown · non-contrast
Comparison: Portable chest x-ray July 02, 2015

CLINICAL DATA: Ventilated patient, acute respiratory failure,
sepsis, overdose, current smoker.

EXAM:
PORTABLE CHEST 1 VIEW

[ap portable]
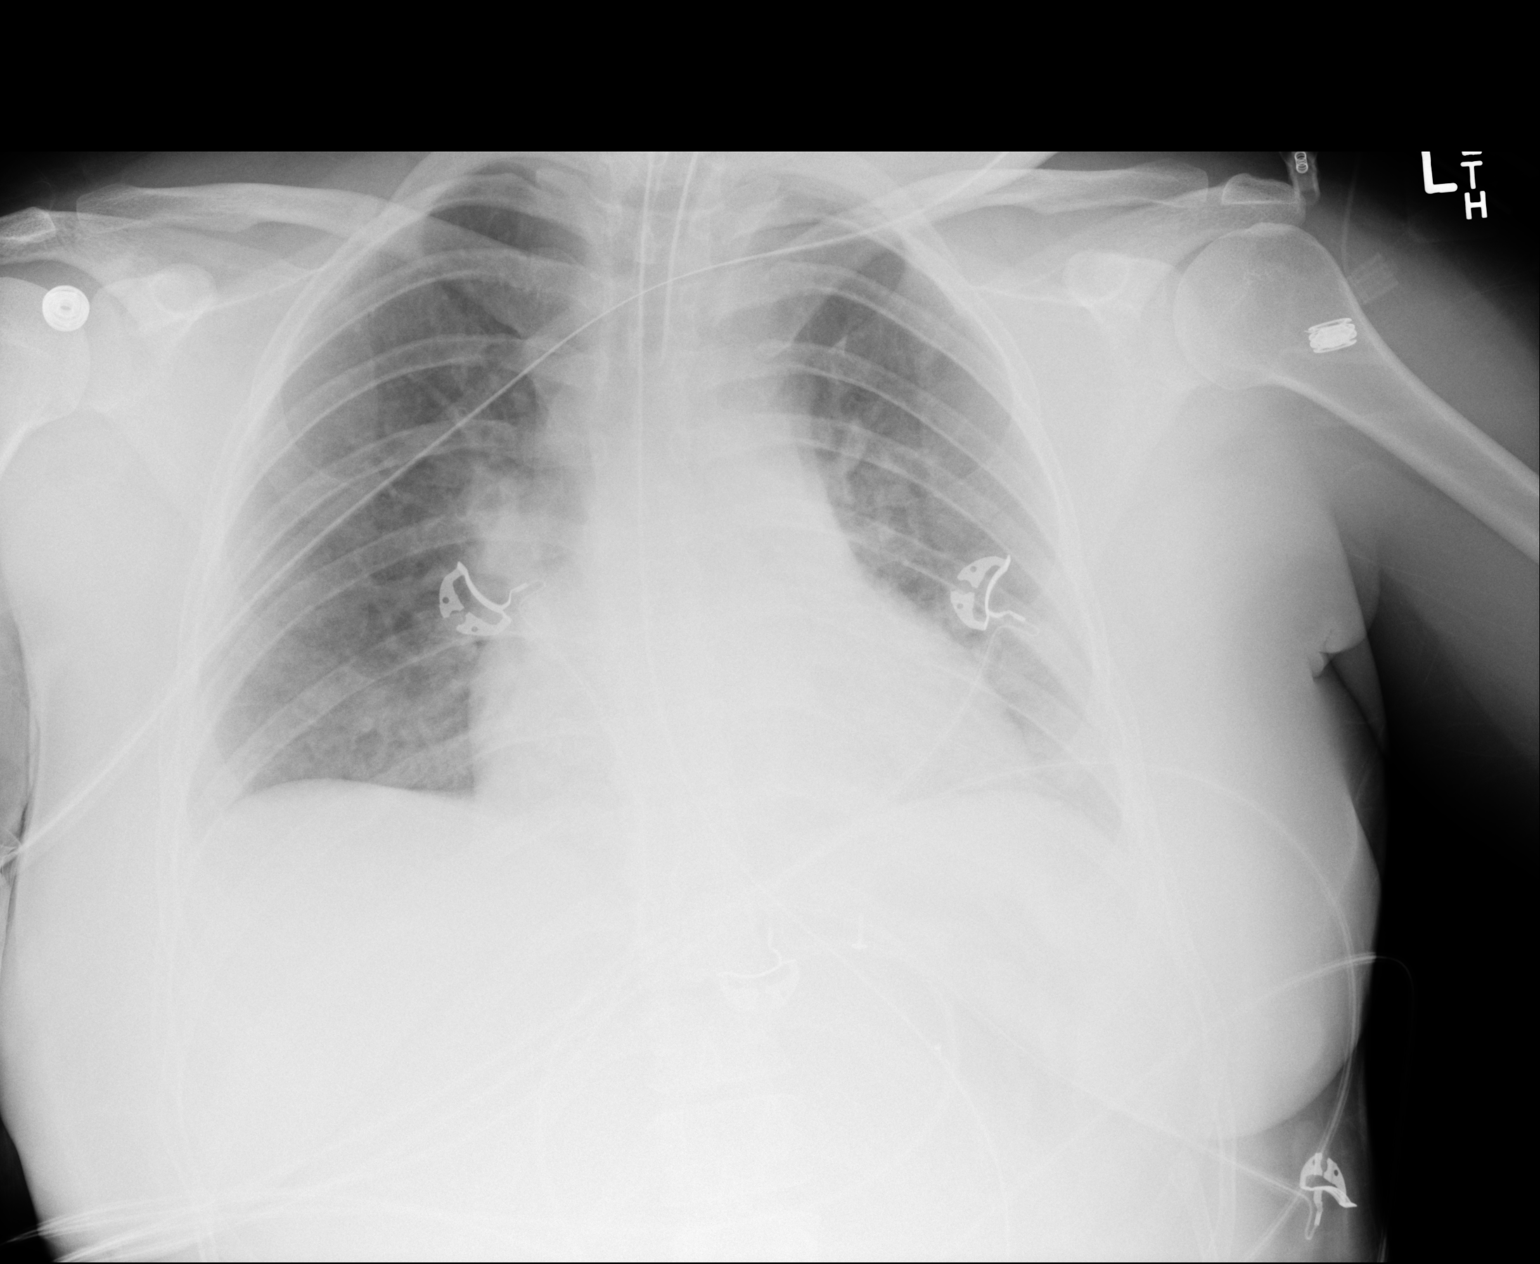

[1 of 1 positions shown; findings below may reference images not displayed]

FINDINGS: The lungs are mildly hypoinflated. Infiltrate in the left mid lung
has improved. The costophrenic angles are sharp. Hazy density in the
right infrahilar region is noted. The heart is mildly enlarged. The
central pulmonary vascularity is prominent but more distinct today.
The endotracheal tube tip lies approximately 3 cm above the carina.
The esophagogastric tube tip projects below the inferior margin of
the image.
IMPRESSION: Improving infiltrate or atelectasis in the left perihilar region.
Persistent mild cardiomegaly with improving central pulmonary
vascular congestion. Subtle right infrahilar interstitial density
more conspicuous today may reflect atelectasis or less likely
pneumonia. The support tubes are in reasonable position.

## 2017-03-01 IMAGING — CR DG CHEST 1V PORT
1 series · 1 of 1 positions shown · non-contrast
Comparison: Yesterday.

CLINICAL DATA: Shortness of breath.

EXAM:
PORTABLE CHEST 1 VIEW

[ap portable]
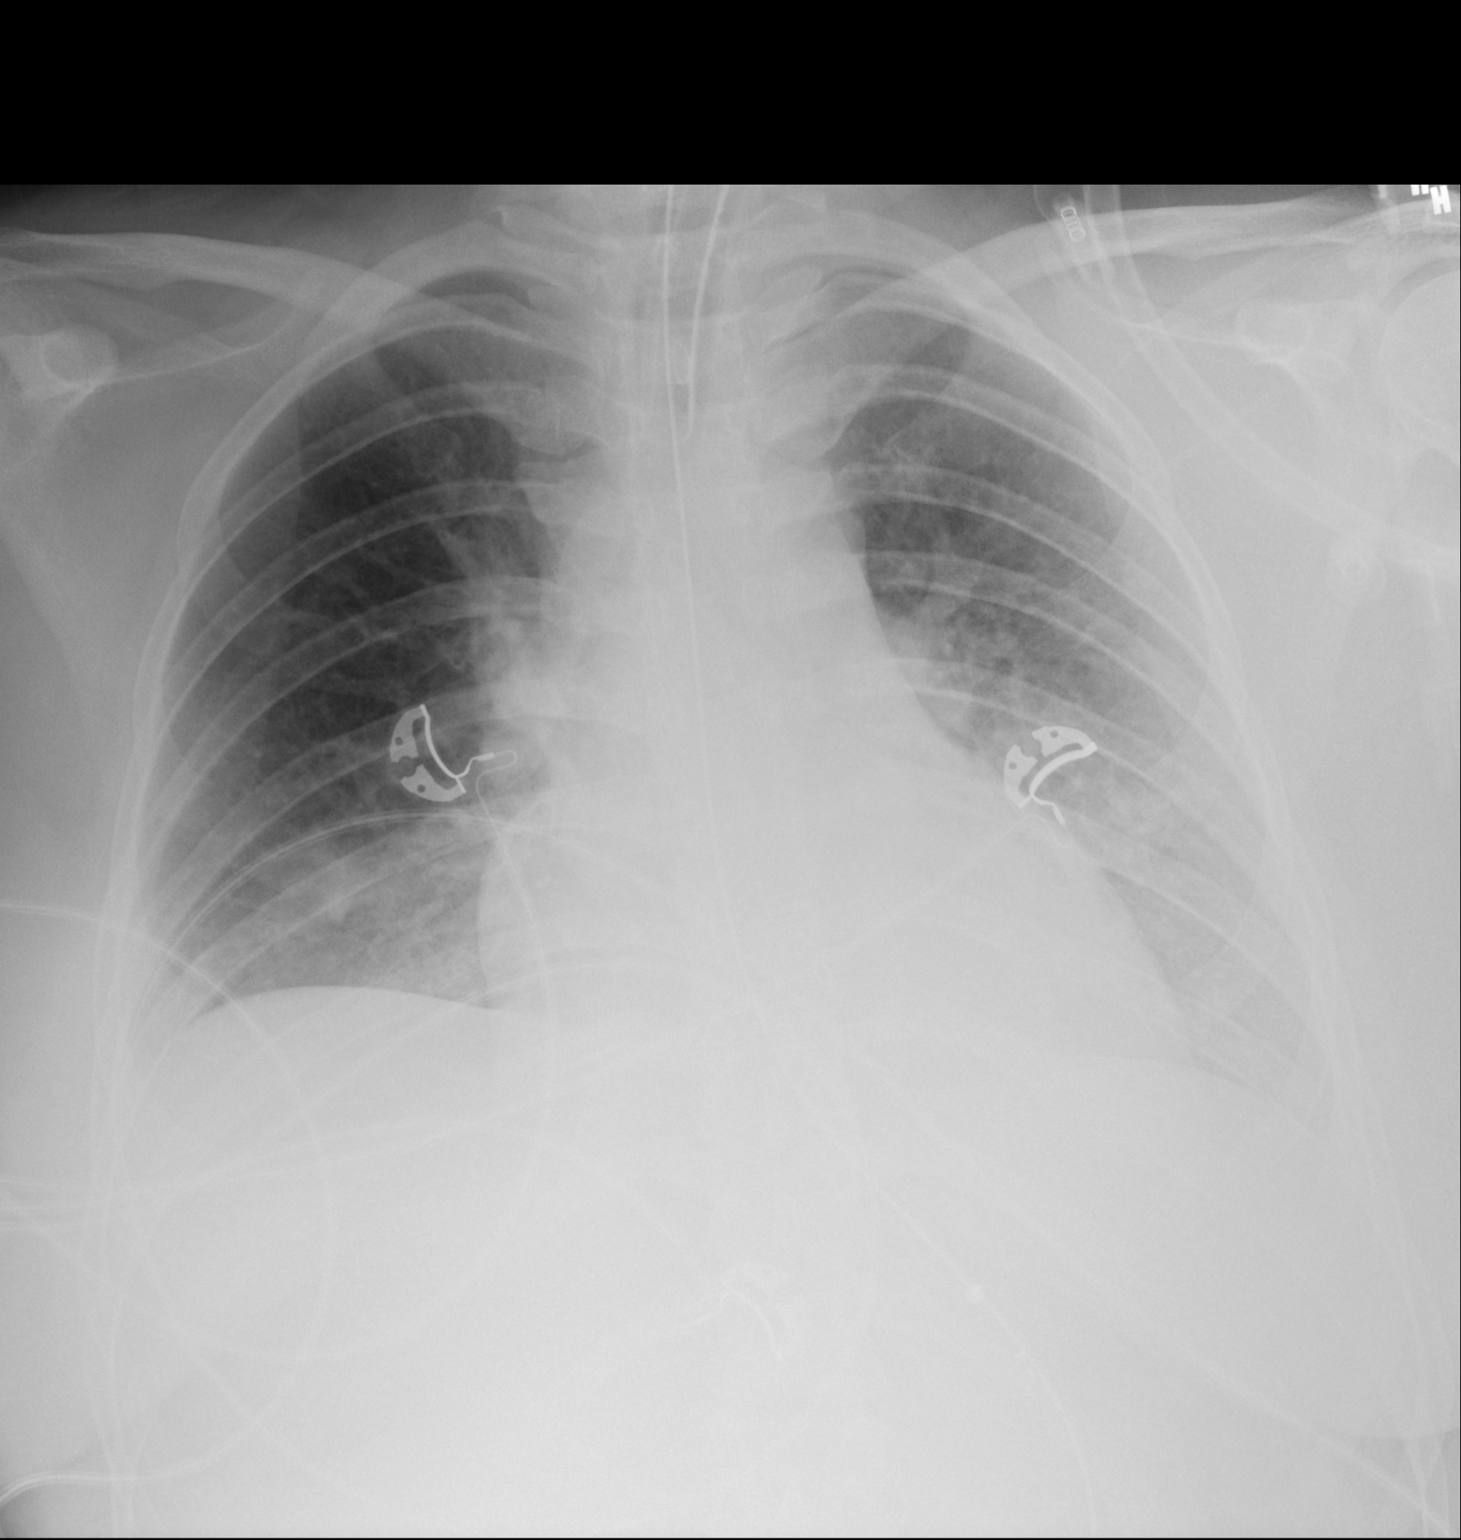

[1 of 1 positions shown; findings below may reference images not displayed]

FINDINGS: Endotracheal tube in satisfactory position. Nasogastric tube
extending into the stomach. Stable enlarged cardiac silhouette. Mild
increase in prominence of the pulmonary vasculature and interstitial
markings. Probable small bilateral pleural effusions.
IMPRESSION: Mild worsening of mild congestive heart failure.

## 2017-05-08 ENCOUNTER — Other Ambulatory Visit: Payer: Self-pay

## 2017-05-08 ENCOUNTER — Emergency Department (HOSPITAL_COMMUNITY): Payer: Self-pay

## 2017-05-08 ENCOUNTER — Encounter (HOSPITAL_COMMUNITY): Payer: Self-pay | Admitting: Emergency Medicine

## 2017-05-08 ENCOUNTER — Observation Stay (HOSPITAL_COMMUNITY)
Admission: EM | Admit: 2017-05-08 | Discharge: 2017-05-11 | Disposition: A | Discharge status: Home / Self Care | Payer: Self-pay | Attending: Internal Medicine | Admitting: Internal Medicine

## 2017-05-08 DIAGNOSIS — Z79899 Other long term (current) drug therapy: Secondary | ICD-10-CM | POA: Insufficient documentation

## 2017-05-08 DIAGNOSIS — F429 Obsessive-compulsive disorder, unspecified: Secondary | ICD-10-CM | POA: Insufficient documentation

## 2017-05-08 DIAGNOSIS — F1910 Other psychoactive substance abuse, uncomplicated: Secondary | ICD-10-CM

## 2017-05-08 DIAGNOSIS — R4182 Altered mental status, unspecified: Secondary | ICD-10-CM | POA: Diagnosis present

## 2017-05-08 DIAGNOSIS — F3164 Bipolar disorder, current episode mixed, severe, with psychotic features: Secondary | ICD-10-CM | POA: Insufficient documentation

## 2017-05-08 DIAGNOSIS — Z888 Allergy status to other drugs, medicaments and biological substances status: Secondary | ICD-10-CM | POA: Insufficient documentation

## 2017-05-08 DIAGNOSIS — Z881 Allergy status to other antibiotic agents status: Secondary | ICD-10-CM | POA: Insufficient documentation

## 2017-05-08 DIAGNOSIS — F1120 Opioid dependence, uncomplicated: Principal | ICD-10-CM | POA: Insufficient documentation

## 2017-05-08 DIAGNOSIS — F17210 Nicotine dependence, cigarettes, uncomplicated: Secondary | ICD-10-CM | POA: Insufficient documentation

## 2017-05-08 DIAGNOSIS — Z87892 Personal history of anaphylaxis: Secondary | ICD-10-CM | POA: Insufficient documentation

## 2017-05-08 DIAGNOSIS — F1911 Other psychoactive substance abuse, in remission: Secondary | ICD-10-CM

## 2017-05-08 DIAGNOSIS — Z886 Allergy status to analgesic agent status: Secondary | ICD-10-CM | POA: Insufficient documentation

## 2017-05-08 DIAGNOSIS — F1220 Cannabis dependence, uncomplicated: Secondary | ICD-10-CM | POA: Insufficient documentation

## 2017-05-08 DIAGNOSIS — F4310 Post-traumatic stress disorder, unspecified: Secondary | ICD-10-CM | POA: Insufficient documentation

## 2017-05-08 LAB — COMPREHENSIVE METABOLIC PANEL
ALT: 54 U/L (ref 14–54)
ANION GAP: 12 (ref 5–15)
AST: 37 U/L (ref 15–41)
Albumin: 4 g/dL (ref 3.5–5.0)
Alkaline Phosphatase: 97 U/L (ref 38–126)
BILIRUBIN TOTAL: 0.5 mg/dL (ref 0.3–1.2)
BUN: 10 mg/dL (ref 6–20)
CO2: 23 mmol/L (ref 22–32)
Calcium: 9.3 mg/dL (ref 8.9–10.3)
Chloride: 107 mmol/L (ref 101–111)
Creatinine, Ser: 0.82 mg/dL (ref 0.44–1.00)
GFR calc Af Amer: >60 mL/min (ref >60)
Glucose, Bld: 109 mg/dL — ABNORMAL HIGH (ref 65–99)
POTASSIUM: 4.1 mmol/L (ref 3.5–5.1)
Sodium: 142 mmol/L (ref 135–145)
TOTAL PROTEIN: 7.8 g/dL (ref 6.5–8.1)

## 2017-05-08 LAB — URINALYSIS, ROUTINE W REFLEX MICROSCOPIC
Bilirubin Urine: NEGATIVE
GLUCOSE, UA: NEGATIVE mg/dL
Hgb urine dipstick: NEGATIVE
KETONES UR: NEGATIVE mg/dL
LEUKOCYTES UA: NEGATIVE
NITRITE: NEGATIVE
PROTEIN: NEGATIVE mg/dL
Specific Gravity, Urine: 1.006 (ref 1.005–1.030)
pH: 6 (ref 5.0–8.0)

## 2017-05-08 LAB — RAPID URINE DRUG SCREEN, HOSP PERFORMED
Amphetamines: NOT DETECTED
BARBITURATES: NOT DETECTED
Benzodiazepines: POSITIVE — AB
COCAINE: NOT DETECTED
Opiates: POSITIVE — AB
Tetrahydrocannabinol: POSITIVE — AB

## 2017-05-08 LAB — CBC WITH DIFFERENTIAL/PLATELET
BASOS PCT: 0 %
Basophils Absolute: 0 10*3/uL (ref 0.0–0.1)
EOS ABS: 0.2 10*3/uL (ref 0.0–0.7)
EOS PCT: 2 %
HCT: 42 % (ref 36.0–46.0)
HEMOGLOBIN: 13.8 g/dL (ref 12.0–15.0)
Lymphocytes Relative: 23 %
Lymphs Abs: 3.1 10*3/uL (ref 0.7–4.0)
MCH: 30 pg (ref 26.0–34.0)
MCHC: 32.9 g/dL (ref 30.0–36.0)
MCV: 91.3 fL (ref 78.0–100.0)
MONOS PCT: 4 %
Monocytes Absolute: 0.6 10*3/uL (ref 0.1–1.0)
NEUTROS PCT: 71 %
Neutro Abs: 9.6 10*3/uL — ABNORMAL HIGH (ref 1.7–7.7)
PLATELETS: 342 10*3/uL (ref 150–400)
RBC: 4.6 MIL/uL (ref 3.87–5.11)
RDW: 12.9 % (ref 11.5–15.5)
WBC: 13.5 10*3/uL — AB (ref 4.0–10.5)

## 2017-05-08 LAB — PROTIME-INR
INR: 0.94
PROTHROMBIN TIME: 12.5 s (ref 11.4–15.2)

## 2017-05-08 LAB — ACETAMINOPHEN LEVEL: Acetaminophen (Tylenol), Serum: <10 ug/mL — ABNORMAL LOW (ref 10–30)

## 2017-05-08 LAB — SALICYLATE LEVEL

## 2017-05-08 LAB — ETHANOL

## 2017-05-08 LAB — PREGNANCY, URINE: PREG TEST UR: NEGATIVE

## 2017-05-08 MED ORDER — CARBAMAZEPINE 200 MG PO TABS
200.0000 mg | ORAL_TABLET | Freq: Three times a day (TID) | ORAL | Status: DC
  Administered 2017-05-08 – 2017-05-11 (×9): 200 mg via ORAL
  Filled 2017-05-08 (×15): qty 1

## 2017-05-08 MED ORDER — RISPERIDONE 1 MG PO TABS
3.0000 mg | ORAL_TABLET | Freq: Two times a day (BID) | ORAL | Status: DC
  Administered 2017-05-08 – 2017-05-11 (×6): 3 mg via ORAL
  Filled 2017-05-08 (×6): qty 3

## 2017-05-08 MED ORDER — SERTRALINE HCL 50 MG PO TABS
50.0000 mg | ORAL_TABLET | Freq: Every day | ORAL | Status: DC
  Administered 2017-05-08 – 2017-05-11 (×4): 50 mg via ORAL
  Filled 2017-05-08 (×4): qty 1

## 2017-05-08 MED ORDER — QUETIAPINE FUMARATE 200 MG PO TABS
600.0000 mg | ORAL_TABLET | Freq: Every day | ORAL | Status: DC
  Administered 2017-05-08 – 2017-05-10 (×3): 600 mg via ORAL
  Filled 2017-05-08: qty 3
  Filled 2017-05-08 (×2): qty 2
  Filled 2017-05-08: qty 3

## 2017-05-08 MED ORDER — ACETAMINOPHEN 325 MG PO TABS
650.0000 mg | ORAL_TABLET | Freq: Four times a day (QID) | ORAL | Status: DC | PRN
  Administered 2017-05-10: 650 mg via ORAL
  Filled 2017-05-08: qty 2

## 2017-05-08 MED ORDER — PRAZOSIN HCL 1 MG PO CAPS
2.0000 mg | ORAL_CAPSULE | Freq: Every day | ORAL | Status: DC
  Administered 2017-05-08 – 2017-05-10 (×3): 2 mg via ORAL
  Filled 2017-05-08 (×2): qty 2
  Filled 2017-05-08 (×2): qty 1

## 2017-05-08 MED ORDER — ACETAMINOPHEN 650 MG RE SUPP: 650.0000 mg | Freq: Four times a day (QID) | RECTAL | Status: DC | PRN

## 2017-05-08 MED ORDER — ONDANSETRON HCL 4 MG/2ML IJ SOLN: 4.0000 mg | Freq: Four times a day (QID) | INTRAMUSCULAR | Status: DC | PRN

## 2017-05-08 MED ORDER — SODIUM CHLORIDE 0.9 % IV SOLN
INTRAVENOUS | Status: DC
  Administered 2017-05-08: 20:00:00 via INTRAVENOUS
  Administered 2017-05-09: 1 mL via INTRAVENOUS
  Administered 2017-05-09: 18:00:00 via INTRAVENOUS

## 2017-05-08 MED ORDER — STERILE WATER FOR INJECTION IJ SOLN
INTRAMUSCULAR | Status: AC
  Filled 2017-05-08: qty 10

## 2017-05-08 MED ORDER — NALOXONE HCL 2 MG/2ML IJ SOSY
PREFILLED_SYRINGE | INTRAMUSCULAR | Status: AC
  Administered 2017-05-08: 1 mg via INTRAMUSCULAR
  Filled 2017-05-08: qty 2

## 2017-05-08 MED ORDER — ENOXAPARIN SODIUM 40 MG/0.4ML ~~LOC~~ SOLN
40.0000 mg | SUBCUTANEOUS | Status: DC
  Administered 2017-05-09 – 2017-05-10 (×2): 40 mg via SUBCUTANEOUS
  Filled 2017-05-08 (×3): qty 0.4

## 2017-05-08 MED ORDER — BENZTROPINE MESYLATE 1 MG PO TABS
0.5000 mg | ORAL_TABLET | Freq: Every day | ORAL | Status: DC
  Administered 2017-05-08 – 2017-05-10 (×3): 0.5 mg via ORAL
  Filled 2017-05-08 (×3): qty 1

## 2017-05-08 MED ORDER — LORAZEPAM 2 MG/ML IJ SOLN
1.0000 mg | INTRAMUSCULAR | Status: DC | PRN
  Administered 2017-05-09 – 2017-05-11 (×8): 1 mg via INTRAVENOUS
  Filled 2017-05-08 (×9): qty 1

## 2017-05-08 MED ORDER — MIRTAZAPINE 30 MG PO TABS
90.0000 mg | ORAL_TABLET | Freq: Every day | ORAL | Status: DC
  Administered 2017-05-08 – 2017-05-10 (×3): 90 mg via ORAL
  Filled 2017-05-08 (×3): qty 3

## 2017-05-08 MED ORDER — PROPRANOLOL HCL 20 MG PO TABS
20.0000 mg | ORAL_TABLET | Freq: Three times a day (TID) | ORAL | Status: DC
  Administered 2017-05-08 – 2017-05-11 (×9): 20 mg via ORAL
  Filled 2017-05-08 (×9): qty 1

## 2017-05-08 MED ORDER — NALOXONE HCL 2 MG/2ML IJ SOSY
1.0000 mg | PREFILLED_SYRINGE | Freq: Once | INTRAMUSCULAR | Status: AC
  Administered 2017-05-08: 1 mg via INTRAMUSCULAR

## 2017-05-08 MED ORDER — LEVOTHYROXINE SODIUM 50 MCG PO TABS
50.0000 ug | ORAL_TABLET | Freq: Every day | ORAL | Status: DC
  Administered 2017-05-09 – 2017-05-11 (×3): 50 ug via ORAL
  Filled 2017-05-08 (×2): qty 2
  Filled 2017-05-08: qty 1

## 2017-05-08 MED ORDER — LORAZEPAM 2 MG/ML IJ SOLN
1.0000 mg | Freq: Once | INTRAMUSCULAR | Status: AC
  Administered 2017-05-08: 1 mg via INTRAVENOUS
  Filled 2017-05-08: qty 1

## 2017-05-08 MED ORDER — ONDANSETRON HCL 4 MG PO TABS: 4.0000 mg | ORAL_TABLET | Freq: Four times a day (QID) | ORAL | Status: DC | PRN

## 2017-05-08 MED ORDER — ZIPRASIDONE MESYLATE 20 MG IM SOLR
20.0000 mg | Freq: Once | INTRAMUSCULAR | Status: DC
  Filled 2017-05-08 (×2): qty 20

## 2017-05-08 NOTE — ED Notes (Signed)
Unable to obtain IV access safely at this time due to agitation.  Ativan administered IM vs IV>

## 2017-05-08 NOTE — ED Notes (Signed)
Difficulty obtaining IV site.  Assisted by [employer] and 2 other staff members.

## 2017-05-08 NOTE — Progress Notes (Signed)
Pt resting with eyes closed. Responds to verbal stimuli, follows commands calmly but refusing to answer admission questions. 1:1 bedside sitter present. [employer] remains at bedside.

## 2017-05-08 NOTE — ED Notes (Signed)
Unable to obtain blood pressure at this time due to pt thrashing around being combative.  Dr. Kerry HoughMemon paged and immediately responded.

## 2017-05-08 NOTE — ED Notes (Signed)
Pt continues to thrash around in bed.  Security and 2 officers standing by to keep pt contained and safe.

## 2017-05-08 NOTE — ED Notes (Signed)
Pt assisted with female urinal.  Pt is cooperative enough to tell staff she has to pee and will lift her hips up to remove clothing.  Sits still to urinate and then begins her behaviors again.  Pt knows she is going to jail when medically clear.
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
## 2017-05-08 NOTE — ED Notes (Signed)
Pt has rolled onto her stomach and is sleeping at this time.  Officer remains at bedside and right hand is cuffed.
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
## 2017-05-08 NOTE — ED Notes (Addendum)
Pt suddenly sleeping.  Not waking to give Geodon.  HR noted to have gone from 130s to 40s.  Pt snoring.  Will continue to monitor.  Police and security remain at bedside.
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
## 2017-05-08 NOTE — ED Notes (Signed)
Officer and security remain at bedside as pt is attempting to get out of restraints and out of bed.

## 2017-05-09 LAB — MRSA PCR SCREENING: MRSA by PCR: NEGATIVE

## 2017-05-09 MED ORDER — NICOTINE 21 MG/24HR TD PT24
21.0000 mg | MEDICATED_PATCH | Freq: Every day | TRANSDERMAL | Status: DC
  Administered 2017-05-09 – 2017-05-11 (×3): 21 mg via TRANSDERMAL
  Filled 2017-05-09 (×3): qty 1

## 2017-05-09 MED ORDER — GABAPENTIN 400 MG PO CAPS
400.0000 mg | ORAL_CAPSULE | Freq: Three times a day (TID) | ORAL | Status: DC
  Administered 2017-05-09 – 2017-05-11 (×8): 400 mg via ORAL
  Filled 2017-05-09 (×8): qty 1

## 2017-05-09 MED ORDER — BUPRENORPHINE HCL 2 MG SL SUBL
12.0000 mg | SUBLINGUAL_TABLET | Freq: Every day | SUBLINGUAL | Status: DC
  Administered 2017-05-09 – 2017-05-11 (×3): 12 mg via SUBLINGUAL
  Filled 2017-05-09 (×3): qty 6

## 2017-05-09 NOTE — Progress Notes (Signed)
Awake, calm and alert. Speech clear. MD notified.

## 2017-05-09 NOTE — Plan of Care (Signed)
Patient awake and care
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
## 2017-05-09 NOTE — BH Assessment (Signed)
Pt [facility] for [facility] for [facility].
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
## 2017-05-09 NOTE — Progress Notes (Signed)
Pt yelling, refusing to stay in bed
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
## 2017-05-10 DIAGNOSIS — F1910 Other psychoactive substance abuse, uncomplicated: Secondary | ICD-10-CM

## 2017-05-10 DIAGNOSIS — F1220 Cannabis dependence, uncomplicated: Secondary | ICD-10-CM

## 2017-05-10 DIAGNOSIS — R4182 Altered mental status, unspecified: Secondary | ICD-10-CM

## 2017-05-10 DIAGNOSIS — F3164 Bipolar disorder, current episode mixed, severe, with psychotic features: Secondary | ICD-10-CM

## 2017-05-10 DIAGNOSIS — Z87898 Personal history of other specified conditions: Secondary | ICD-10-CM

## 2017-05-10 NOTE — Progress Notes (Signed)
Pt referral packet sent out again.  Sent electronically to the following hospitals:  [facility]     [facility]  [facility]  [facility]  [facility]  [facility]        Sent via fax to: [facility] (not yet entered in system to send electronically.)  Timmothy EulerJean T. Kaylyn LimSutter, MSW, LCSWA Disposition Clinical Social Work [phone] (cell) [phone] (office)
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
## 2017-05-10 NOTE — Plan of Care (Signed)
All care plans progressing 

## 2017-05-11 ENCOUNTER — Encounter (HOSPITAL_COMMUNITY): Payer: Self-pay | Admitting: Registered Nurse

## 2017-05-11 NOTE — Care Management (Signed)
Discharging home today. Pt uninsured. Per MD pt will not be prescribed any new medications. Pt has hospital f/u.
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
## 2017-05-11 NOTE — Progress Notes (Signed)
Patient's boyfriend called about paient's belongings, upset the secretary on the phone by yelling and being aggressive per the secretary. Nursing Supervisor talked to boyfriend and agreed to meet him downstairs and gave him the patient's belongings.

## 2017-05-11 NOTE — Progress Notes (Signed)
Patient is psych cleared per Assunta FoundShuvon Rankin, NP.  Outpatient substance abuse resources, including rehab and residential treatment sent to unit with the request that patient receive prior to d/c if she is not yet medically cleared, and that a copy be included in her d/c packet. Pt nurse, Nita SellsMaryann, RN, notified.  Timmothy EulerJean T. Kaylyn LimSutter, MSW, LCSWA Disposition Clinical Social Work [phone] (cell) [phone] (office)

## 2017-05-26 ENCOUNTER — Encounter: Payer: Self-pay | Admitting: Emergency Medicine

## 2017-05-26 ENCOUNTER — Other Ambulatory Visit: Payer: Self-pay

## 2017-05-26 ENCOUNTER — Emergency Department
Admission: EM | Admit: 2017-05-26 | Discharge: 2017-05-26 | Disposition: A | Discharge status: Other Acute Inpatient Hospital | Payer: PRIVATE HEALTH INSURANCE | Attending: Emergency Medicine | Admitting: Emergency Medicine

## 2017-05-26 ENCOUNTER — Inpatient Hospital Stay
Admission: AD | Admit: 2017-05-26 | Discharge: 2017-06-02 | DRG: 885 | Disposition: A | Discharge status: Home / Self Care | Payer: No Typology Code available for payment source | Attending: Psychiatry | Admitting: Psychiatry

## 2017-05-26 DIAGNOSIS — Z79890 Hormone replacement therapy (postmenopausal): Secondary | ICD-10-CM | POA: Diagnosis not present

## 2017-05-26 DIAGNOSIS — F1120 Opioid dependence, uncomplicated: Secondary | ICD-10-CM | POA: Diagnosis present

## 2017-05-26 DIAGNOSIS — F1320 Sedative, hypnotic or anxiolytic dependence, uncomplicated: Secondary | ICD-10-CM | POA: Diagnosis present

## 2017-05-26 DIAGNOSIS — F429 Obsessive-compulsive disorder, unspecified: Secondary | ICD-10-CM | POA: Diagnosis present

## 2017-05-26 DIAGNOSIS — Z590 Homelessness: Secondary | ICD-10-CM | POA: Diagnosis not present

## 2017-05-26 DIAGNOSIS — Z881 Allergy status to other antibiotic agents status: Secondary | ICD-10-CM

## 2017-05-26 DIAGNOSIS — Z9114 Patient's other noncompliance with medication regimen: Secondary | ICD-10-CM | POA: Insufficient documentation

## 2017-05-26 DIAGNOSIS — Z811 Family history of alcohol abuse and dependence: Secondary | ICD-10-CM

## 2017-05-26 DIAGNOSIS — Z886 Allergy status to analgesic agent status: Secondary | ICD-10-CM

## 2017-05-26 DIAGNOSIS — R451 Restlessness and agitation: Secondary | ICD-10-CM | POA: Insufficient documentation

## 2017-05-26 DIAGNOSIS — Z653 Problems related to other legal circumstances: Secondary | ICD-10-CM

## 2017-05-26 DIAGNOSIS — F29 Unspecified psychosis not due to a substance or known physiological condition: Secondary | ICD-10-CM | POA: Insufficient documentation

## 2017-05-26 DIAGNOSIS — Z560 Unemployment, unspecified: Secondary | ICD-10-CM

## 2017-05-26 DIAGNOSIS — F1220 Cannabis dependence, uncomplicated: Secondary | ICD-10-CM | POA: Diagnosis present

## 2017-05-26 DIAGNOSIS — R45851 Suicidal ideations: Secondary | ICD-10-CM | POA: Diagnosis present

## 2017-05-26 DIAGNOSIS — F411 Generalized anxiety disorder: Secondary | ICD-10-CM | POA: Diagnosis present

## 2017-05-26 DIAGNOSIS — Z598 Other problems related to housing and economic circumstances: Secondary | ICD-10-CM

## 2017-05-26 DIAGNOSIS — F3163 Bipolar disorder, current episode mixed, severe, without psychotic features: Secondary | ICD-10-CM | POA: Diagnosis present

## 2017-05-26 DIAGNOSIS — G4710 Hypersomnia, unspecified: Secondary | ICD-10-CM | POA: Diagnosis present

## 2017-05-26 DIAGNOSIS — Z79899 Other long term (current) drug therapy: Secondary | ICD-10-CM | POA: Diagnosis not present

## 2017-05-26 DIAGNOSIS — Z046 Encounter for general psychiatric examination, requested by authority: Secondary | ICD-10-CM | POA: Insufficient documentation

## 2017-05-26 DIAGNOSIS — F17210 Nicotine dependence, cigarettes, uncomplicated: Secondary | ICD-10-CM | POA: Insufficient documentation

## 2017-05-26 DIAGNOSIS — E039 Hypothyroidism, unspecified: Secondary | ICD-10-CM | POA: Diagnosis present

## 2017-05-26 DIAGNOSIS — F1910 Other psychoactive substance abuse, uncomplicated: Secondary | ICD-10-CM

## 2017-05-26 DIAGNOSIS — F419 Anxiety disorder, unspecified: Secondary | ICD-10-CM | POA: Insufficient documentation

## 2017-05-26 DIAGNOSIS — F329 Major depressive disorder, single episode, unspecified: Secondary | ICD-10-CM | POA: Insufficient documentation

## 2017-05-26 DIAGNOSIS — F3164 Bipolar disorder, current episode mixed, severe, with psychotic features: Secondary | ICD-10-CM

## 2017-05-26 DIAGNOSIS — F4310 Post-traumatic stress disorder, unspecified: Secondary | ICD-10-CM | POA: Diagnosis present

## 2017-05-26 DIAGNOSIS — F250 Schizoaffective disorder, bipolar type: Secondary | ICD-10-CM | POA: Diagnosis present

## 2017-05-26 DIAGNOSIS — Z813 Family history of other psychoactive substance abuse and dependence: Secondary | ICD-10-CM | POA: Diagnosis not present

## 2017-05-26 DIAGNOSIS — Z915 Personal history of self-harm: Secondary | ICD-10-CM

## 2017-05-26 DIAGNOSIS — F1420 Cocaine dependence, uncomplicated: Secondary | ICD-10-CM | POA: Diagnosis present

## 2017-05-26 DIAGNOSIS — Z818 Family history of other mental and behavioral disorders: Secondary | ICD-10-CM | POA: Diagnosis not present

## 2017-05-26 DIAGNOSIS — R443 Hallucinations, unspecified: Secondary | ICD-10-CM | POA: Diagnosis present

## 2017-05-26 DIAGNOSIS — F1010 Alcohol abuse, uncomplicated: Secondary | ICD-10-CM

## 2017-05-26 DIAGNOSIS — K279 Peptic ulcer, site unspecified, unspecified as acute or chronic, without hemorrhage or perforation: Secondary | ICD-10-CM | POA: Diagnosis present

## 2017-05-26 HISTORY — DX: Hypothyroidism, unspecified: E03.9

## 2017-05-26 LAB — CBC
HCT: 37.8 % (ref 35.0–47.0)
HEMOGLOBIN: 13 g/dL (ref 12.0–16.0)
MCH: 31.4 pg (ref 26.0–34.0)
MCHC: 34.3 g/dL (ref 32.0–36.0)
MCV: 91.6 fL (ref 80.0–100.0)
Platelets: 283 10*3/uL (ref 150–440)
RBC: 4.13 MIL/uL (ref 3.80–5.20)
RDW: 13.4 % (ref 11.5–14.5)
WBC: 8.3 10*3/uL (ref 3.6–11.0)

## 2017-05-26 LAB — ETHANOL: Alcohol, Ethyl (B): <10 mg/dL (ref <10)

## 2017-05-26 LAB — URINE DRUG SCREEN, QUALITATIVE (ARMC ONLY)
AMPHETAMINES, UR SCREEN: NOT DETECTED
Barbiturates, Ur Screen: NOT DETECTED
Benzodiazepine, Ur Scrn: POSITIVE — AB
COCAINE METABOLITE, UR ~~LOC~~: NOT DETECTED
Cannabinoid 50 Ng, Ur ~~LOC~~: POSITIVE — AB
MDMA (Ecstasy)Ur Screen: NOT DETECTED
Methadone Scn, Ur: NOT DETECTED
Opiate, Ur Screen: POSITIVE — AB
Phencyclidine (PCP) Ur S: NOT DETECTED
TRICYCLIC, UR SCREEN: NOT DETECTED

## 2017-05-26 LAB — COMPREHENSIVE METABOLIC PANEL
ALBUMIN: 4.1 g/dL (ref 3.5–5.0)
ALT: 64 U/L — ABNORMAL HIGH (ref 14–54)
ANION GAP: 9 (ref 5–15)
AST: 42 U/L — ABNORMAL HIGH (ref 15–41)
Alkaline Phosphatase: 86 U/L (ref 38–126)
BILIRUBIN TOTAL: 0.4 mg/dL (ref 0.3–1.2)
BUN: 16 mg/dL (ref 6–20)
CO2: 22 mmol/L (ref 22–32)
Calcium: 8.6 mg/dL — ABNORMAL LOW (ref 8.9–10.3)
Chloride: 107 mmol/L (ref 101–111)
Creatinine, Ser: 0.73 mg/dL (ref 0.44–1.00)
GFR calc Af Amer: >60 mL/min (ref >60)
GFR calc non Af Amer: >60 mL/min (ref >60)
GLUCOSE: 98 mg/dL (ref 65–99)
POTASSIUM: 4.1 mmol/L (ref 3.5–5.1)
Sodium: 138 mmol/L (ref 135–145)
TOTAL PROTEIN: 7.5 g/dL (ref 6.5–8.1)

## 2017-05-26 LAB — POCT PREGNANCY, URINE: Preg Test, Ur: NEGATIVE

## 2017-05-26 MED ORDER — THIAMINE HCL 100 MG/ML IJ SOLN: 100.0000 mg | Freq: Every day | INTRAMUSCULAR | Status: DC

## 2017-05-26 MED ORDER — QUETIAPINE FUMARATE 200 MG PO TABS
400.0000 mg | ORAL_TABLET | Freq: Every day | ORAL | Status: DC
  Administered 2017-05-27 – 2017-05-29 (×3): 400 mg via ORAL
  Filled 2017-05-26 (×3): qty 2

## 2017-05-26 MED ORDER — MAGNESIUM HYDROXIDE 400 MG/5ML PO SUSP: 30.0000 mL | Freq: Every day | ORAL | Status: DC | PRN

## 2017-05-26 MED ORDER — PRAZOSIN HCL 2 MG PO CAPS
2.0000 mg | ORAL_CAPSULE | Freq: Every day | ORAL | Status: DC
  Administered 2017-05-26: 2 mg via ORAL
  Filled 2017-05-26: qty 1

## 2017-05-26 MED ORDER — ADULT MULTIVITAMIN W/MINERALS CH
1.0000 | ORAL_TABLET | Freq: Every day | ORAL | Status: DC
  Administered 2017-05-27 – 2017-06-02 (×7): 1 via ORAL
  Filled 2017-05-26 (×7): qty 1

## 2017-05-26 MED ORDER — CHLORDIAZEPOXIDE HCL 25 MG PO CAPS
50.0000 mg | ORAL_CAPSULE | Freq: Once | ORAL | Status: DC
  Filled 2017-05-26: qty 2

## 2017-05-26 MED ORDER — HALOPERIDOL 5 MG PO TABS
5.0000 mg | ORAL_TABLET | ORAL | Status: AC
  Administered 2017-05-26: 5 mg via ORAL

## 2017-05-26 MED ORDER — LORAZEPAM 2 MG PO TABS
2.0000 mg | ORAL_TABLET | Freq: Once | ORAL | Status: AC
  Administered 2017-05-26: 2 mg via ORAL

## 2017-05-26 MED ORDER — GABAPENTIN 300 MG PO CAPS
600.0000 mg | ORAL_CAPSULE | Freq: Three times a day (TID) | ORAL | Status: DC
  Administered 2017-05-27 – 2017-06-02 (×19): 600 mg via ORAL
  Filled 2017-05-26 (×19): qty 2

## 2017-05-26 MED ORDER — VITAMIN B-1 100 MG PO TABS
100.0000 mg | ORAL_TABLET | Freq: Every day | ORAL | Status: DC
  Administered 2017-05-26: 100 mg via ORAL
  Filled 2017-05-26: qty 1

## 2017-05-26 MED ORDER — VITAMIN B-1 100 MG PO TABS
100.0000 mg | ORAL_TABLET | Freq: Every day | ORAL | Status: DC
  Administered 2017-05-27 – 2017-06-02 (×7): 100 mg via ORAL
  Filled 2017-05-26 (×7): qty 1

## 2017-05-26 MED ORDER — LORAZEPAM 2 MG/ML IJ SOLN: 1.0000 mg | Freq: Four times a day (QID) | INTRAMUSCULAR | Status: AC | PRN

## 2017-05-26 MED ORDER — CHLORDIAZEPOXIDE HCL 25 MG PO CAPS
50.0000 mg | ORAL_CAPSULE | Freq: Once | ORAL | Status: AC
  Administered 2017-05-27: 50 mg via ORAL
  Filled 2017-05-26: qty 2

## 2017-05-26 MED ORDER — HALOPERIDOL 5 MG PO TABS
ORAL_TABLET | ORAL | Status: AC
  Administered 2017-05-26: 5 mg via ORAL
  Filled 2017-05-26: qty 1

## 2017-05-26 MED ORDER — BENZTROPINE MESYLATE 1 MG PO TABS
ORAL_TABLET | ORAL | Status: AC
  Administered 2017-05-26: 1 mg via ORAL
  Filled 2017-05-26: qty 1

## 2017-05-26 MED ORDER — ADULT MULTIVITAMIN W/MINERALS CH: 1.0000 | ORAL_TABLET | Freq: Every day | ORAL | Status: DC

## 2017-05-26 MED ORDER — LEVOTHYROXINE SODIUM 25 MCG PO TABS: 50.0000 ug | ORAL_TABLET | Freq: Every day | ORAL | Status: DC

## 2017-05-26 MED ORDER — BUPRENORPHINE HCL-NALOXONE HCL 8-2 MG SL SUBL
1.0000 | SUBLINGUAL_TABLET | Freq: Every day | SUBLINGUAL | Status: AC
  Administered 2017-05-27 – 2017-05-28 (×2): 1 via SUBLINGUAL
  Filled 2017-05-26 (×2): qty 1

## 2017-05-26 MED ORDER — PRAZOSIN HCL 2 MG PO CAPS
2.0000 mg | ORAL_CAPSULE | Freq: Every day | ORAL | Status: DC
  Administered 2017-05-27 – 2017-06-01 (×6): 2 mg via ORAL
  Filled 2017-05-26 (×7): qty 1

## 2017-05-26 MED ORDER — LORAZEPAM 1 MG PO TABS
1.0000 mg | ORAL_TABLET | Freq: Four times a day (QID) | ORAL | Status: AC | PRN
  Administered 2017-05-27 – 2017-05-29 (×6): 1 mg via ORAL
  Filled 2017-05-26 (×6): qty 1

## 2017-05-26 MED ORDER — FLUOXETINE HCL 10 MG PO CAPS
30.0000 mg | ORAL_CAPSULE | Freq: Every day | ORAL | Status: DC
  Administered 2017-05-27 – 2017-06-02 (×7): 30 mg via ORAL
  Filled 2017-05-26 (×8): qty 1

## 2017-05-26 MED ORDER — NICOTINE 14 MG/24HR TD PT24
MEDICATED_PATCH | TRANSDERMAL | Status: AC
  Filled 2017-05-26: qty 1

## 2017-05-26 MED ORDER — LORAZEPAM 1 MG PO TABS
1.0000 mg | ORAL_TABLET | Freq: Four times a day (QID) | ORAL | Status: DC | PRN
  Administered 2017-05-26: 1 mg via ORAL
  Filled 2017-05-26: qty 1

## 2017-05-26 MED ORDER — LORAZEPAM 2 MG/ML IJ SOLN: 1.0000 mg | Freq: Four times a day (QID) | INTRAMUSCULAR | Status: DC | PRN

## 2017-05-26 MED ORDER — HALOPERIDOL 2 MG PO TABS
2.0000 mg | ORAL_TABLET | Freq: Two times a day (BID) | ORAL | Status: DC
  Administered 2017-05-27 – 2017-05-28 (×3): 2 mg via ORAL
  Filled 2017-05-26 (×3): qty 1

## 2017-05-26 MED ORDER — ACETAMINOPHEN 325 MG PO TABS
650.0000 mg | ORAL_TABLET | Freq: Four times a day (QID) | ORAL | Status: DC | PRN
  Administered 2017-05-28 – 2017-06-01 (×9): 650 mg via ORAL
  Filled 2017-05-26 (×9): qty 2

## 2017-05-26 MED ORDER — BUPRENORPHINE HCL-NALOXONE HCL 8-2 MG SL SUBL
1.0000 | SUBLINGUAL_TABLET | SUBLINGUAL | Status: AC
  Administered 2017-05-26: 1 via SUBLINGUAL
  Filled 2017-05-26: qty 1

## 2017-05-26 MED ORDER — FOLIC ACID 1 MG PO TABS
1.0000 mg | ORAL_TABLET | Freq: Every day | ORAL | Status: DC
  Administered 2017-05-27 – 2017-06-02 (×7): 1 mg via ORAL
  Filled 2017-05-26 (×7): qty 1

## 2017-05-26 MED ORDER — BENZTROPINE MESYLATE 1 MG PO TABS
1.0000 mg | ORAL_TABLET | ORAL | Status: AC
  Administered 2017-05-26: 1 mg via ORAL

## 2017-05-26 MED ORDER — BENZTROPINE MESYLATE 1 MG PO TABS
0.5000 mg | ORAL_TABLET | Freq: Two times a day (BID) | ORAL | Status: DC
  Administered 2017-05-26: 0.5 mg via ORAL
  Filled 2017-05-26: qty 1

## 2017-05-26 MED ORDER — CHLORDIAZEPOXIDE HCL 25 MG PO CAPS
50.0000 mg | ORAL_CAPSULE | Freq: Once | ORAL | Status: DC
  Administered 2017-05-26: 50 mg via ORAL
  Filled 2017-05-26: qty 2

## 2017-05-26 MED ORDER — BUPRENORPHINE HCL-NALOXONE HCL 8-2 MG SL SUBL: 1.0000 | SUBLINGUAL_TABLET | Freq: Every day | SUBLINGUAL | Status: DC

## 2017-05-26 MED ORDER — BENZTROPINE MESYLATE 1 MG PO TABS
0.5000 mg | ORAL_TABLET | Freq: Two times a day (BID) | ORAL | Status: DC
  Administered 2017-05-27 – 2017-06-02 (×13): 0.5 mg via ORAL
  Filled 2017-05-26 (×13): qty 1

## 2017-05-26 MED ORDER — LORAZEPAM 1 MG PO TABS
ORAL_TABLET | ORAL | Status: AC
  Administered 2017-05-26: 2 mg via ORAL
  Filled 2017-05-26: qty 2

## 2017-05-26 MED ORDER — HALOPERIDOL 0.5 MG PO TABS
2.0000 mg | ORAL_TABLET | Freq: Two times a day (BID) | ORAL | Status: DC
  Administered 2017-05-26: 2 mg via ORAL
  Filled 2017-05-26: qty 4

## 2017-05-26 MED ORDER — GABAPENTIN 300 MG PO CAPS
600.0000 mg | ORAL_CAPSULE | Freq: Three times a day (TID) | ORAL | Status: DC
  Administered 2017-05-26 (×2): 600 mg via ORAL
  Filled 2017-05-26 (×2): qty 2

## 2017-05-26 MED ORDER — ALUM & MAG HYDROXIDE-SIMETH 200-200-20 MG/5ML PO SUSP
30.0000 mL | ORAL | Status: DC | PRN
  Administered 2017-05-27 – 2017-06-01 (×15): 30 mL via ORAL
  Filled 2017-05-26 (×15): qty 30

## 2017-05-26 MED ORDER — QUETIAPINE FUMARATE 200 MG PO TABS
400.0000 mg | ORAL_TABLET | Freq: Every day | ORAL | Status: DC
  Administered 2017-05-26: 400 mg via ORAL
  Filled 2017-05-26 (×2): qty 2

## 2017-05-26 MED ORDER — FOLIC ACID 1 MG PO TABS
1.0000 mg | ORAL_TABLET | Freq: Every day | ORAL | Status: DC
  Administered 2017-05-26: 1 mg via ORAL
  Filled 2017-05-26: qty 1

## 2017-05-26 MED ORDER — FLUOXETINE HCL 10 MG PO CAPS
30.0000 mg | ORAL_CAPSULE | Freq: Every day | ORAL | Status: DC
Start: 2017-05-26 — End: 2017-05-26

## 2017-05-26 MED ORDER — LEVOTHYROXINE SODIUM 50 MCG PO TABS
50.0000 ug | ORAL_TABLET | Freq: Every day | ORAL | Status: DC
  Administered 2017-05-27 – 2017-06-02 (×7): 50 ug via ORAL
  Filled 2017-05-26 (×7): qty 1

## 2017-05-26 NOTE — ED Notes (Signed)
[facility]
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
## 2017-05-26 NOTE — Consult Note (Addendum)
[facility] Consult   Reason for Consult: Consult for 27 year old woman with a history of bipolar versus schizoaffective disorder, substance abuse.  Presents in agitation with hallucinations Referring Physician:  Alfred Levins Patient Identification: Becky [name] MRN:  [id number] Principal Diagnosis: Bipolar disorder, curr episode mixed, severe, with psychotic features [facility]) Diagnosis:   Patient Active Problem List   Diagnosis Date Noted  . Alcohol abuse [F10.10] 05/26/2017    Priority: High  . Bipolar disorder, curr episode mixed, severe, with psychotic features ([place]) [F31.64] 11/02/2016    Priority: High  . Opioid use disorder, moderate, dependence ([place]) [F11.20] 06/06/2016    Priority: High  . Sedative, hypnotic or anxiolytic use disorder, severe, dependence ([place]) [F13.20] 06/06/2016    Priority: High  . Hallucinations [R44.3]     Priority: Medium  . Peptic ulcer disease [K27.9] 06/17/2015    Priority: Medium  . Polysubstance abuse ([place]) [F19.10]   . Altered mental state [R41.82] 05/08/2017  . PTSD (post-traumatic stress disorder) [F43.10] 11/02/2016  . GAD (generalized anxiety disorder) [F41.1] 10/16/2016  . Cocaine use disorder, severe, dependence ([place]) [F14.20] 06/06/2016  . Cannabis use disorder, severe, dependence ([place]) [F12.20] 06/06/2016  . MDD (major depressive disorder), recurrent severe, without psychosis ([place]) [F33.2] 06/03/2016  . Bipolar I disorder, most recent episode depressed ([place]) [F31.30] 07/04/2015  . Overdose [T50.901A] 07/02/2015  . Lactic acidosis [E87.2] 07/02/2015  . Acute respiratory failure ([place]) [J96.00] 07/02/2015  . Sepsis ([place]) [A41.9] 07/02/2015  . AKI (acute kidney injury) ([place]) [N17.9]   . Altered mental status [R41.82]   . Pyrexia [R50.9]   . Abdominal pain, epigastric [R10.13] 06/17/2015  . Hepatitis C antibody test positive [R76.8] 06/17/2015  . Epigastric pain [R10.13]   . Transaminitis [R74.0] 06/16/2015  . History of  substance abuse [Z87.898] 06/16/2015    Total Time spent with patient: 1 hour  Subjective:   Becky [name] is a 27 y.o. female patient admitted with "I cannot take the voices".  HPI: Patient interviewed chart reviewed.  27 year old woman with a history of mental health problems presents to the emergency room in distress.  She says that she is having auditory hallucinations that are loud in both ears and are driving her crazy.  They have been going on for several days.  She has not been able to sleep for days.  Her mood is labile angry depressed agitated.  She is having suicidal thoughts although she has not tried to hurt her self.  Patient says she stopped all of her medicines a couple weeks ago.  She has been semi-homeless living with boyfriends and in situations that are good for her.  She has been back to abusing multiple substances.  She says her last use of alcohol was yesterday early this morning.  Opiates as recently as yesterday also abusing benzodiazepines.  Social history: Patient currently has no stable place to live.  She does have a child but that child has been adopted away.  Not working.  Medical history: Hepatitis C positive, history of thyroid disease and gastric ulcer disease.  Substance abuse history: Extended history of abuse of multiple drugs including opiates alcohol and benzodiazepines.  She was seeing a provider who is prescribing Suboxone for her in the community but she could not afford it anymore and she stopped it a couple weeks ago and had not went back to opiate abuse.  She says that she has had a seizure from withdrawal of alcohol and benzodiazepines in the past.  Past Psychiatric History: Patient has had  several prior admissions to behavioral health Hospital.  She has had at least one past suicide attempt.  She has a history of agitation although is not clear that she is been violent certainly not in a clinical setting.  She her most recent medications were a  combination of Seroquel Prozac Remeron gabapentin as needed Haldol and Subutex.  Patient is requesting medicine for withdrawal and also to treat her hallucinations currently.  She says she was only recently diagnosed with schizoaffective disorder.  Risk to Self: Is patient at risk for suicide?: Yes Risk to Others:   Prior Inpatient Therapy:   Prior Outpatient Therapy:    Past Medical History:  Past Medical History:  Diagnosis Date  . Bipolar 1 disorder ([place])   . Bronchitis   . GAD (generalized anxiety disorder)   . Hepatitis C antibody test positive 06/17/2015  . History of substance abuse 06/16/2015  . OCD (obsessive compulsive disorder)   . Peptic ulcer   . Polysubstance abuse ([place])   . PTSD (post-traumatic stress disorder)     Past Surgical History:  Procedure Laterality Date  . OTHER SURGICAL HISTORY     scar tissue removed from right ovary   . OTHER SURGICAL HISTORY     fallopian tube repair  . WISDOM TOOTH EXTRACTION     Family History:  Family History  Problem Relation Age of Onset  . Drug abuse Brother   . Suicidality Brother   . Cancer Other   . Depression Mother   . Drug abuse Mother   . Alcoholism Father   . Bipolar disorder Cousin    Family Psychiatric  History: Unclear family history Social History:  Social History   Substance and Sexual Activity  Alcohol Use Yes   Comment: occ     Social History   Substance and Sexual Activity  Drug Use Yes  . Types: Marijuana, Methamphetamines, MDMA (Ecstacy), Oxycodone, Benzodiazepines, Cocaine    Social History   Socioeconomic History  . Marital status: Legally Separated    Spouse name: None  . Number of children: None  . Years of education: None  . Highest education level: None  Social Needs  . Financial resource strain: None  . Food insecurity - worry: None  . Food insecurity - inability: None  . Transportation needs - medical: None  . Transportation needs - non-medical: None  Occupational History  .  None  Tobacco Use  . Smoking status: Current Every Day Smoker    Packs/day: 1.00    Years: 10.00    Pack years: 10.00    Types: Cigarettes  . Smokeless tobacco: Never Used  Substance and Sexual Activity  . Alcohol use: Yes    Comment: occ  . Drug use: Yes    Types: Marijuana, Methamphetamines, MDMA (Ecstacy), Oxycodone, Benzodiazepines, Cocaine  . Sexual activity: Yes    Birth control/protection: None  Other Topics Concern  . None  Social History Narrative  . None   Additional Social History:    Allergies:   Allergies  Allergen Reactions  . Azithromycin Anaphylaxis  . Nsaids Other (See Comments)    Reaction:  Stomach ulcers    . Tolmetin Other (See Comments)    Reaction:  Stomach ulcers     Labs:  Results for orders placed or performed during the hospital encounter of 05/26/17 (from the past 48 hour(s))  Comprehensive metabolic panel     Status: Abnormal   Collection Time: 05/26/17 12:00 PM  Result Value Ref Range  Sodium 138 135 - 145 mmol/L   Potassium 4.1 3.5 - 5.1 mmol/L   Chloride 107 101 - 111 mmol/L   CO2 22 22 - 32 mmol/L   Glucose, Bld 98 65 - 99 mg/dL   BUN 16 6 - 20 mg/dL   Creatinine, Ser 0.73 0.44 - 1.00 mg/dL   Calcium 8.6 (L) 8.9 - 10.3 mg/dL   Total Protein 7.5 6.5 - 8.1 g/dL   Albumin 4.1 3.5 - 5.0 g/dL   AST 42 (H) 15 - 41 U/L   ALT 64 (H) 14 - 54 U/L   Alkaline Phosphatase 86 38 - 126 U/L   Total Bilirubin 0.4 0.3 - 1.2 mg/dL   GFR calc non Af Amer >60 >60 mL/min   GFR calc Af Amer >60 >60 mL/min    Comment: (NOTE) The eGFR has been calculated using the CKD EPI equation. This calculation has not been validated in all clinical situations. eGFR's persistently <60 mL/min signify possible Chronic Kidney Disease.    Anion gap 9 5 - 15    Comment: Performed at [facility], [address] [place], [place] [address]  Ethanol     Status: None   Collection Time: 05/26/17 12:00 PM  Result Value Ref Range   Alcohol, Ethyl (B) <10  <10 mg/dL    Comment:        LOWEST DETECTABLE LIMIT FOR SERUM ALCOHOL IS 10 mg/dL FOR MEDICAL PURPOSES ONLY Performed at [facility], [address] [place], [place] [address]   cbc     Status: None   Collection Time: 05/26/17 12:00 PM  Result Value Ref Range   WBC 8.3 3.6 - 11.0 K/uL   RBC 4.13 3.80 - 5.20 MIL/uL   Hemoglobin 13.0 12.0 - 16.0 g/dL   HCT 37.8 35.0 - 47.0 %   MCV 91.6 80.0 - 100.0 fL   MCH 31.4 26.0 - 34.0 pg   MCHC 34.3 32.0 - 36.0 g/dL   RDW 13.4 11.5 - 14.5 %   Platelets 283 150 - 440 K/uL    Comment: Performed at [facility], [address] [place], [place] [address]  Urine Drug Screen, Qualitative     Status: Abnormal   Collection Time: 05/26/17 12:02 PM  Result Value Ref Range   Tricyclic, Ur Screen NONE DETECTED NONE DETECTED   Amphetamines, Ur Screen NONE DETECTED NONE DETECTED   MDMA (Ecstasy)Ur Screen NONE DETECTED NONE DETECTED   Cocaine Metabolite,Ur [place] NONE DETECTED NONE DETECTED   Opiate, Ur Screen POSITIVE (A) NONE DETECTED   Phencyclidine (PCP) Ur S NONE DETECTED NONE DETECTED   Cannabinoid 50 Ng, Ur [place] POSITIVE (A) NONE DETECTED   Barbiturates, Ur Screen NONE DETECTED NONE DETECTED   Benzodiazepine, Ur Scrn POSITIVE (A) NONE DETECTED   Methadone Scn, Ur NONE DETECTED NONE DETECTED    Comment: (NOTE) Tricyclics + metabolites, urine    Cutoff 1000 ng/mL Amphetamines + metabolites, urine  Cutoff 1000 ng/mL MDMA (Ecstasy), urine              Cutoff 500 ng/mL Cocaine Metabolite, urine          Cutoff 300 ng/mL Opiate + metabolites, urine        Cutoff 300 ng/mL Phencyclidine (PCP), urine         Cutoff 25 ng/mL Cannabinoid, urine                 Cutoff 50 ng/mL Barbiturates + metabolites, urine  Cutoff 200 ng/mL  Benzodiazepine, urine              Cutoff 200 ng/mL Methadone, urine                   Cutoff 300 ng/mL The urine drug screen provides only a preliminary, unconfirmed analytical test result and should not be  used for non-medical purposes. Clinical consideration and professional judgment should be applied to any positive drug screen result due to possible interfering substances. A more specific alternate chemical method must be used in order to obtain a confirmed analytical result. Gas chromatography / mass spectrometry (GC/MS) is the preferred confirmat ory method. Performed at [facility], [address] [place], [place] [address]   Pregnancy, urine POC     Status: None   Collection Time: 05/26/17 12:23 PM  Result Value Ref Range   Preg Test, Ur NEGATIVE NEGATIVE    Comment:        THE SENSITIVITY OF THIS METHODOLOGY IS >24 mIU/mL     Current Facility-Administered Medications  Medication Dose Route Frequency Provider Last Rate Last Dose  . benztropine (COGENTIN) tablet 0.5 mg  0.5 mg Oral BID Rin Gorton, Madie Reno, MD      . Derrill Memo ON 05/27/2017] buprenorphine-naloxone (SUBOXONE) 8-2 mg per SL tablet 1 tablet  1 tablet Sublingual Daily Sherese Heyward T, MD      . chlordiazePOXIDE (LIBRIUM) capsule 50 mg  50 mg Oral Once Wilbert Schouten T, MD      . FLUoxetine (PROZAC) capsule 30 mg  30 mg Oral Daily Karlye Ihrig T, MD      . folic acid (FOLVITE) tablet 1 mg  1 mg Oral Daily Franck Vinal T, MD      . gabapentin (NEURONTIN) capsule 600 mg  600 mg Oral TID Oneil Behney T, MD      . haloperidol (HALDOL) tablet 2 mg  2 mg Oral BID Anubis Fundora T, MD      . Derrill Memo ON 05/27/2017] levothyroxine (SYNTHROID, LEVOTHROID) tablet 50 mcg  50 mcg Oral QAC breakfast Donyale Berthold T, MD      . LORazepam (ATIVAN) tablet 1 mg  1 mg Oral Q6H PRN Lakoda Mcanany, Madie Reno, MD       Or  . LORazepam (ATIVAN) injection 1 mg  1 mg Intravenous Q6H PRN Dorea Duff T, MD      . multivitamin with minerals tablet 1 tablet  1 tablet Oral Daily Tashauna Caisse T, MD      . nicotine (NICODERM CQ - dosed in mg/24 hours) 14 mg/24hr patch           . prazosin (MINIPRESS) capsule 2 mg  2 mg Oral QHS Karlisha Mathena T, MD       . QUEtiapine (SEROQUEL) tablet 400 mg  400 mg Oral QHS Shemar Plemmons T, MD      . thiamine (VITAMIN B-1) tablet 100 mg  100 mg Oral Daily Alisabeth Selkirk T, MD       Or  . thiamine (B-1) injection 100 mg  100 mg Intravenous Daily Twala Collings, Madie Reno, MD       Current Outpatient Medications  Medication Sig Dispense Refill  . benztropine (COGENTIN) 0.5 MG tablet Take 1 tablet (0.5 mg total) by mouth at bedtime. For prevention of drug induced tremors. 30 tablet 0  . Buprenorphine HCl-Naloxone HCl (SUBOXONE) 12-3 MG FILM Place 1 Film under the tongue every morning.    . carbamazepine (TEGRETOL) 200 MG tablet take 1  Tablet  by mouth 3 times daily FOR mood stabilization  0  . FLUoxetine (PROZAC) 10 MG tablet Take 10 mg by mouth daily. Takes along with 20 mg to equal 51m daily    . FLUoxetine (PROZAC) 20 MG tablet Take 20 mg by mouth daily. Takes with 169mto equal 3047maily    . gabapentin (NEURONTIN) 600 MG tablet Take 600 mg by mouth 3 (three) times daily.   0  . levothyroxine (SYNTHROID, LEVOTHROID) 50 MCG tablet Take 1 tablet (50 mcg total) by mouth daily before breakfast. For low thyroid Hormone 30 tablet 0  . mirtazapine (REMERON) 45 MG tablet Take 1 tablet (45 mg total) by mouth at bedtime. For depression/sleep (Patient taking differently: Take 90 mg by mouth at bedtime. For depression/sleep) 30 tablet 0  . prazosin (MINIPRESS) 2 MG capsule Take 1 capsule (2 mg total) by mouth at bedtime. For nightmares 30 capsule 0  . QUEtiapine (SEROQUEL) 300 MG tablet Take 2 tablets (600 mg total) by mouth at bedtime. For mood control (Patient taking differently: Take 600 mg by mouth at bedtime. 300m26m morning, 300mg6mafternoon and 400mg 38might.  For mood control) 60 tablet 0  . risperiDONE (RISPERDAL) 3 MG tablet Take 3 mg by mouth 2 (two) times daily.    . sertraline (ZOLOFT) 50 MG tablet Take 1 Tablet by mouth once daily FOR depression  0    Musculoskeletal: Strength & Muscle Tone: within normal  limits Gait & Station: normal Patient leans: N/A  Psychiatric Specialty Exam: Physical Exam  Nursing note and vitals reviewed. Constitutional: She appears well-developed and well-nourished.  HENT:  Head: Normocephalic and atraumatic.  Eyes: Conjunctivae are normal. Pupils are equal, round, and reactive to light.  Neck: Normal range of motion.  Cardiovascular: Regular rhythm and normal heart sounds.  Respiratory: Effort normal. No respiratory distress.  GI: Soft. She exhibits no distension.  Musculoskeletal: Normal range of motion.  Neurological: She is alert.  Skin: Skin is warm and dry.  Psychiatric: Her mood appears anxious. Her affect is labile and inappropriate. Her speech is rapid and/or pressured and tangential. She is agitated, hyperactive and actively hallucinating. She is not aggressive and not combative. Cognition and memory are impaired. She expresses impulsivity and inappropriate judgment. She exhibits a depressed mood. She expresses suicidal ideation. She expresses no homicidal ideation. She exhibits abnormal recent memory.    Review of Systems  Constitutional: Negative.   HENT: Negative.   Eyes: Negative.   Respiratory: Negative.   Cardiovascular: Negative.   Gastrointestinal: Negative.   Musculoskeletal: Negative.   Skin: Negative.   Neurological: Negative.   Psychiatric/Behavioral: Positive for depression, hallucinations, memory loss, substance abuse and suicidal ideas. The patient is nervous/anxious and has insomnia.     Blood pressure (!) 120/98, pulse 72, temperature 98.2 F (36.8 C), temperature source Oral, resp. rate 20, height '5\' 7"'  (1.702 m), weight 79.4 kg (175 lb), last menstrual period 04/26/2017, SpO2 98 %.Body mass index is 27.41 kg/m.  General Appearance: Casual  Eye Contact:  Fair  Speech:  Pressured  Volume:  Increased  Mood:  Angry, Anxious, Dysphoric and Irritable  Affect:  Labile and Tearful  Thought Process:  Disorganized  Orientation:   Full (Time, Place, and Person)  Thought Content:  Hallucinations: Auditory  Suicidal Thoughts:  Yes.  with intent/plan  Homicidal Thoughts:  No  Memory:  Immediate;   Fair Recent;   Fair Remote;   Fair  Judgement:  Impaired  Insight:  Shallow  Psychomotor Activity:  Restlessness  Concentration:  Concentration: Fair  Recall:  [employer] of Knowledge:  Fair  Language:  Fair  Akathisia:  No  Handed:  Right  AIMS (if indicated):     Assets:  Communication Skills Desire for Improvement Physical Health Resilience  ADL's:  Intact  Cognition:  WNL  Sleep:        Treatment Plan Summary: Daily contact with patient to assess and evaluate symptoms and progress in treatment, Medication management and Plan 27 year old woman with schizoaffective disorder and substance abuse.  Very agitated.  Able to give some history however and cooperative with treatment.  Labs all reviewed.  Past history and notes from other hospitalizations reviewed.  The patient will be given acute only a dose of Suboxone for opiate withdrawal as well as put on orders for detox protocol from alcohol.  I explained to her that if she wants to make her goal complete withdrawal from opiates we can use Suboxone for 3 days in the hospital.  She agrees that this is her plan.  Orders in place for Suboxone then for the next 2 days at least.  Restarting Seroquel Haldol gabapentin Synthroid Minipress Cogentin.  Patient agreeable to all of this.  EKG was done within the last month no need to repeat right now.  Other labs will be rechecked.  Orders done for admission downstairs with 15-minute checks.  Disposition: Recommend psychiatric Inpatient admission when medically cleared. Supportive therapy provided about ongoing stressors.  Alethia Berthold, MD 05/26/2017 3:12 PM

## 2017-05-26 NOTE — BH Assessment (Signed)
[facility] by Dr. Toni Amendlapacs.  Attending Physician will be Dr. Johnella MoloneyMcNew.   Patient [facility] room [facility] Lorenda HatchetSlade.   Intake Paper Work has been signed and placed on patient chart.  ER staff is aware of the admission Delaney Meigs( [facility].; Dr. Derrill KayGoodman, ER MD; Minerva AreolaEric Patient's Nurse & Marylene LandAngela Patient Access).

## 2017-05-26 NOTE — Plan of Care (Signed)
  Not Applicable Education: Knowledge of General Education information will improve 05/26/2017 2349 - Not Applicable by Berkley Harveyeynolds, Page Pucciarelli I, RN Health Behavior/Discharge Planning: Ability to manage health-related needs will improve 05/26/2017 2349 - Not Applicable by Addison Naegelieynolds, Shamal Stracener I, RN Clinical Measurements: Ability to maintain clinical measurements within normal limits will improve 05/26/2017 2349 - Not Applicable by Addison Naegelieynolds, Shawonda Kerce I, RN Will remain free from infection 05/26/2017 2349 - Not Applicable by Berkley Harveyeynolds, [place] Sarinana I, RN Diagnostic test results will improve 05/26/2017 2349 - Not Applicable by Addison Naegelieynolds, Adalberto Metzgar I, RN Respiratory complications will improve 05/26/2017 2349 - Not Applicable by Addison Naegelieynolds, Lavren Lewan I, RN Cardiovascular complication will be avoided 05/26/2017 2349 - Not Applicable by Addison Naegelieynolds, Eliga Arvie I, RN Activity: Risk for activity intolerance will decrease 05/26/2017 2349 - Not Applicable by Addison Naegelieynolds, Zerline Melchior I, RN Nutrition: Adequate nutrition will be maintained 05/26/2017 2349 - Not Applicable by Addison Naegelieynolds, Juandie[place] Kolenovic I, RN Coping: Level of anxiety will decrease 05/26/2017 2349 - Not Applicable by Addison Naegelieynolds, Marnesha Gagen I, RN Elimination: Will not experience complications related to bowel motility 05/26/2017 2349 - Not Applicable by Addison Naegelieynolds, Deniyah Dillavou I, RN Will not experience complications related to urinary retention 05/26/2017 2349 - Not Applicable by Berkley Harveyeynolds, Hadlie Gipson I, RN Pain Managment: General experience of comfort will improve 05/26/2017 2349 - Not Applicable by Addison Naegelieynolds, Lea Walbert I, RN Safety: Ability to remain free from injury will improve 05/26/2017 2349 - Not Applicable by Addison Naegelieynolds, Barnaby Rippeon I, RN Skin Integrity: Risk for impaired skin integrity will decrease 05/26/2017 2349 - Not Applicable by Addison Naegelieynolds, Daritza Brees I, RN Spiritual Needs Ability to function at adequate level 05/26/2017 2349 - Not Applicable by Berkley Harveyeynolds, Kaziyah Parkison I, RN
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
## 2017-05-26 NOTE — ED Provider Notes (Signed)
[facility] Emergency Department Provider Note  ____________________________________________  Time seen: Approximately 1:52 PM  I have reviewed the triage vital signs and the nursing notes.   HISTORY  Chief Complaint Suicidal   HPI Ambrose PancoastDanielle Antigua is a 27 y.o. female with a history of schizoaffective disorder, polysubstance abusewho presents for evaluation of suicidal ideation. Patient reports that she stopped taking all of her medications 2 weeks ago because she didn't like how they made her feel. Since then patient reports having visual and auditory hallucinations. Voices telling her they're going to kill her and her family. Patient has had 3 days of constant suicidal ideation with a plan to overdose. Her symptoms have been severe and constant. She has been drinking alcohol, taking benzodiazepines and opiates. She reports that the last alcohol and benzo dose was yesterday evening. She denies chest pain, abdominal pain, nausea, vomiting, fever, diarrhea.  Chief Complaint: suicidal ideation Severity: severe Duration: constant Timing: 3 days Context: in the setting of medication non compliance and polysubstance abuse Associated signs/symptoms: hallucinations    Past Medical History:  Diagnosis Date  . Bipolar 1 disorder (HCC)   . Bronchitis   . GAD (generalized anxiety disorder)   . Hepatitis C antibody test positive 06/17/2015  . History of substance abuse 06/16/2015  . OCD (obsessive compulsive disorder)   . Peptic ulcer   . Polysubstance abuse (HCC)   . PTSD (post-traumatic stress disorder)     Patient Active Problem List   Diagnosis Date Noted  . Polysubstance abuse (HCC)   . Altered mental state 05/08/2017  . Hallucinations   . Bipolar disorder, curr episode mixed, severe, with psychotic features (HCC) 11/02/2016  . PTSD (post-traumatic stress disorder) 11/02/2016  . GAD (generalized anxiety disorder) 10/16/2016  . Opioid use disorder,  moderate, dependence (HCC) 06/06/2016  . 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 Cocaine use disorder, severe, dependence (HCC) 06/06/2016  . Cannabis use disorder, severe, dependence (HCC) 06/06/2016  . 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 Bipolar I disorder, most recent episode depressed (HCC) 07/04/2015  . Overdose 07/02/2015  . Lactic acidosis 07/02/2015  . Acute respiratory failure (HCC) 07/02/2015  . Sepsis (HCC) 07/02/2015  . AKI (acute kidney injury) (HCC)   . Altered mental status   . Pyrexia   . Abdominal pain, epigastric 06/17/2015  . Hepatitis C antibody test positive 06/17/2015  . Peptic ulcer disease 06/17/2015  . Epigastric pain   . Transaminitis 06/16/2015  . History of substance abuse 06/16/2015    Past Surgical History:  Procedure Laterality Date  . OTHER SURGICAL HISTORY     scar tissue removed from right ovary   . OTHER SURGICAL HISTORY     fallopian tube repair  . WISDOM TOOTH EXTRACTION      Prior to Admission medications   Medication Sig Start Date End Date Taking? Authorizing Provider  benztropine (COGENTIN) 0.5 MG tablet Take 1 tablet (0.5 mg total) by mouth at bedtime. For prevention of drug induced tremors. 11/09/16   Armandina StammerNwoko, Agnes I, NP  Buprenorphine HCl-Naloxone HCl (SUBOXONE) 12-3 MG FILM Place 1 Film under the tongue every morning.    [provider]  carbamazepine (TEGRETOL) 200 MG tablet take 1  Tablet by mouth 3 times daily FOR mood stabilization 10/21/16   [provider]  FLUoxetine (PROZAC) 10 MG tablet Take 10 mg by mouth daily. Takes along with 20 mg to equal 30mg  daily  [provider]  FLUoxetine (PROZAC) 20 MG tablet Take 20 mg by mouth daily. Takes with 10mg  to equal 30mg  daily    [provider]  gabapentin (NEURONTIN) 600 MG tablet Take 600 mg by mouth 3 (three) times daily.  02/20/17   [provider]  levothyroxine (SYNTHROID, LEVOTHROID) 50 MCG tablet Take 1 tablet (50 mcg total) by mouth daily before breakfast. For low thyroid Hormone 11/08/16   Armandina Stammer I, NP  mirtazapine (REMERON) 45 MG tablet Take 1 tablet (45 mg total) by mouth at bedtime. For depression/sleep Patient taking differently: Take 90 mg by mouth at bedtime. For depression/sleep 11/08/16   Armandina Stammer I, NP  prazosin (MINIPRESS) 2 MG capsule Take 1 capsule (2 mg total) by mouth at bedtime. For nightmares 11/08/16   Armandina Stammer I, NP  QUEtiapine (SEROQUEL) 300 MG tablet Take 2 tablets (600 mg total) by mouth at bedtime. For mood control Patient taking differently: Take 600 mg by mouth at bedtime. 300mg  in morning, 300mg  in afternoon and 400mg  at night.  For mood control 11/08/16   Armandina Stammer I, NP  risperiDONE (RISPERDAL) 3 MG tablet Take 3 mg by mouth 2 (two) times daily.    [provider]  sertraline (ZOLOFT) 50 MG tablet Take 1 Tablet by mouth once daily FOR depression 10/21/16   [provider]    Allergies Azithromycin; Nsaids; and Tolmetin  Family History  Problem Relation Age of Onset  . Drug abuse Brother   . Suicidality Brother   . Cancer Other   . Depression Mother   . Drug abuse Mother   . Alcoholism Father   . Bipolar disorder Cousin     Social History Social History   Tobacco Use  . Smoking status: Current Every Day Smoker    Packs/day: 1.00    Years: 10.00    Pack years: 10.00    Types: Cigarettes  . Smokeless tobacco: Never Used  Substance Use Topics  . Alcohol use: Yes    Comment: occ  . Drug use: Yes    Types: Marijuana, Methamphetamines, MDMA (Ecstacy), Oxycodone, Benzodiazepines, Cocaine    Review of Systems  Constitutional: Negative for fever. Eyes: Negative for visual changes. ENT: Negative for sore throat. Neck: No neck pain  Cardiovascular: Negative for chest pain. Respiratory: Negative for shortness of breath. Gastrointestinal: Negative  for abdominal pain, vomiting or diarrhea. Genitourinary: Negative for dysuria. Musculoskeletal: Negative for back pain. Skin: Negative for rash. Neurological: Negative for headaches, weakness or numbness. Psych: + depression, SI, hallucinations. No HI  ____________________________________________   PHYSICAL EXAM:  VITAL SIGNS: ED Triage Vitals  Enc Vitals Group     BP 05/26/17 1159 (!) 120/98     Pulse Rate 05/26/17 1159 72     Resp 05/26/17 1159 20     Temp 05/26/17 1159 98.2 F (36.8 C)     Temp Source 05/26/17 1159 Oral     SpO2 05/26/17 1159 98 %     Weight 05/26/17 1201 175 lb (79.4 kg)     Height 05/26/17 1201 5\' 7"  (1.702 m)     Head Circumference --      Peak Flow --      Pain Score 05/26/17 1158 6     Pain Loc --      Pain Edu? --      Excl. in GC? --     Constitutional: Alert and oriented.  HEENT:      Head: Normocephalic and atraumatic.  Eyes: Conjunctivae are normal. Sclera is non-icteric.       Mouth/Throat: Mucous membranes are moist.       Neck: Supple with no signs of meningismus. Cardiovascular: Regular rate and rhythm. No murmurs, gallops, or rubs. 2+ symmetrical distal pulses are present in all extremities. No JVD. Respiratory: Normal respiratory effort. Lungs are clear to auscultation bilaterally. No wheezes, crackles, or rhonchi.  Gastrointestinal: Soft, non tender, and non distended with positive bowel sounds. No rebound or guarding. Musculoskeletal: Nontender with normal range of motion in all extremities. No edema, cyanosis, or erythema of extremities. Neurologic: Normal speech and language. Face is symmetric. Moving all extremities. No gross focal neurologic deficits are appreciated. Skin: Skin is warm, dry and intact. No rash noted. Psychiatric: Mood and affect are depressed, patient is crying, agitated. Si with plan ____________________________________________   LABS (all labs ordered are listed, but only abnormal results are  displayed)  Labs Reviewed  COMPREHENSIVE METABOLIC PANEL - Abnormal; Notable for the following components:      Result Value   Calcium 8.6 (*)    AST 42 (*)    ALT 64 (*)    All other components within normal limits  URINE DRUG SCREEN, QUALITATIVE ([facility] ONLY) - Abnormal; Notable for the following components:   Opiate, Ur Screen POSITIVE (*)    Cannabinoid 50 Ng, Ur [place] POSITIVE (*)    Benzodiazepine, Ur Scrn POSITIVE (*)    All other components within normal limits  ETHANOL  CBC  POC URINE PREG, ED  POCT PREGNANCY, URINE   ____________________________________________  EKG  none  ____________________________________________  RADIOLOGY  none  ____________________________________________   PROCEDURES  Procedure(s) performed: None Procedures Critical Care performed:  None ____________________________________________   INITIAL IMPRESSION / ASSESSMENT AND PLAN / ED COURSE  27 y.o. female with a history of schizoaffective disorder, polysubstance abusewho presents for evaluation of suicidal ideation with plan in the setting of polysubstance abuse and medication noncompliant. Patient also with visual and auditory hallucinations. At this time patient meets criteria for involuntary commitment. Psychiatry has been consulted. Dr. Toni Amend has evaluated the patient and recommended inpatient admission. Patient will be monitored closely until bed becomes available. Labs with no acute findings       As part of my medical decision making, I reviewed the following data within the electronic MEDICAL RECORD NUMBER Nursing notes reviewed and incorporated, Labs reviewed , A consult was requested and obtained from this/these consultant(s) Psychiatry, Notes from prior ED visits and  Controlled Substance Database    Pertinent labs & imaging results that were available during my care of the patient were reviewed by me and considered in my medical decision making (see chart for  details).    ____________________________________________   FINAL CLINICAL IMPRESSION(S) / ED DIAGNOSES  Final diagnoses:  Suicidal ideation  Polysubstance abuse (HCC)  Psychosis, unspecified psychosis type (HCC)      NEW MEDICATIONS STARTED DURING THIS VISIT:  ED Discharge Orders    None       Note:  This document was prepared using Dragon voice recognition software and may include unintentional dictation errors.    Don Perking, [place], MD 05/26/17 [phone]

## 2017-05-26 NOTE — ED Triage Notes (Signed)
Pt reports that she has been off her medications for two weeks. She has been seeing hallucinations and hearing voices. She reports that she is SI and has a plan to OD. Pt is very fidgity, tearful and anxious.

## 2017-05-26 NOTE — Tx Team (Signed)
Initial Treatment Plan 05/26/2017 11:52 PM Becky Pancoastanielle Cloe ZOX:[id number]    PATIENT STRESSORS: Educational concerns Financial difficulties Health problems Legal issue Medication change or noncompliance Occupational concerns Substance abuse   PATIENT STRENGTHS: Motivation for treatment/growth Supportive family/friends   PATIENT IDENTIFIED PROBLEMS: Inaffective Coping Skills 05/26/17  Suicidal Ideation 05/26/17  Mood Instability 05/26/17                 DISCHARGE CRITERIA:  Adequate post-discharge living arrangements Medical problems require only outpatient monitoring Safe-care adequate arrangements made  PRELIMINARY DISCHARGE PLAN: Attend aftercare/continuing care group Attend 12-step recovery group Placement in alternative living arrangements  PATIENT/FAMILY INVOLVEMENT: This treatment plan has been presented to and reviewed with the patient, Becky PancoastDanielle Volante,  The patient and family have been given the opportunity to ask questions and make suggestions.  Merlene Pullingsie A Yonael Tulloch, RN 05/26/2017, 11:52 PM

## 2017-05-26 NOTE — ED Notes (Signed)
Pt is alert and oriented this evening. Pt mood is anxious and affect is sad. Pt is somewhat attention seeking, but is ultimately cooperative with staff. Pt preoccupied with medications at this time, but denies SI and states she feels safe. Writer discussed tx plan and 15 minute checks are ongoing for safety.

## 2017-05-26 NOTE — ED Notes (Signed)
Patient is in shower. 

## 2017-05-26 NOTE — ED Notes (Signed)
Pt IVC'd by Dr.Veronese/Shannon RN & ODS officer Nunzio CoryLyons are aware

## 2017-05-26 NOTE — ED Notes (Signed)
Pt calm and sitting in bed at this time. Pt updated on time it could take to get her downstairs. Pt requesting a quieter area. Pt has not been responding well to other pts in this area. Pt no longer hitting head against wall but other pts are no longer yelling either. PT does verbalize feeling calmer but continues to hear voices.

## 2017-05-26 NOTE — ED Notes (Signed)
Patient transferred from quad to room 2, she wanted TV on, Patient has flat affect, she is only fixated on medications that she takes, wants to know if she can have more haldol, more librium, and suboxone. Patient went to dayroom, she is safe, nurse explained to her about the unit being a holding unit until she can get a bed for in-patient, she voices understanding. Patient with q 15 minute checks and camera surveillance in progress for safety.

## 2017-05-26 NOTE — ED Notes (Signed)
Patient had come to nursing station, and states that she is hearing voices and having anxiety, states she needed something now, nurse did let her know she had ativan as needed, and she ask for medication, nurse administered and she took without difficulty, Patient did state that her thoughts to die were not has severe, or strong, but she still was hearing voices, nurse will continue to monitor, q 15 minute checks,and camera surveillance in progress for safety.

## 2017-05-26 NOTE — ED Notes (Signed)
PT  IVC  SEEN  BY  DR  CLAPACS  PENDING  PLACEMENT 

## 2017-05-26 NOTE — ED Notes (Signed)
Received Custody order/ Signed by Tera [facility]gs [employer]/ Carollee HerterShannon RN & ODS officer Caffie DammeEast are aware

## 2017-05-26 NOTE — ED Notes (Signed)
[facility], she was moved to room 4 due to being too hot, Patient is calm and cooperative.

## 2017-05-27 DIAGNOSIS — F250 Schizoaffective disorder, bipolar type: Principal | ICD-10-CM

## 2017-05-27 LAB — HEMOGLOBIN A1C
Hgb A1c MFr Bld: 5.3 % (ref 4.8–5.6)
MEAN PLASMA GLUCOSE: 105.41 mg/dL

## 2017-05-27 LAB — LIPID PANEL
CHOLESTEROL: 182 mg/dL (ref 0–200)
HDL: 49 mg/dL (ref >40)
LDL Cholesterol: 82 mg/dL (ref 0–99)
Total CHOL/HDL Ratio: 3.7 RATIO
Triglycerides: 254 mg/dL — ABNORMAL HIGH (ref <150)
VLDL: 51 mg/dL — ABNORMAL HIGH (ref 0–40)

## 2017-05-27 LAB — TSH: TSH: 3.101 u[IU]/mL (ref 0.350–4.500)

## 2017-05-27 MED ORDER — NICOTINE 21 MG/24HR TD PT24
21.0000 mg | MEDICATED_PATCH | Freq: Every day | TRANSDERMAL | Status: DC
  Administered 2017-05-27 – 2017-06-01 (×6): 21 mg via TRANSDERMAL
  Filled 2017-05-27 (×7): qty 1

## 2017-05-27 NOTE — Progress Notes (Signed)
[facility] responded to an order requisition to consult with the patient. Patient was not in her room. Patient was in a group session. CH will follow-up at a later time; or upon patient request.

## 2017-05-27 NOTE — H&P (Addendum)
Psychiatric Admission Assessment Adult  Patient Identification: Becky [name] MRN:  [id number] Date of Evaluation:  05/27/2017 Chief Complaint:  bipolar bisorder with psychotic features Principal Diagnosis: <principal problem not specified> Diagnosis:   Patient Active Problem List   Diagnosis Date Noted  . Alcohol abuse [F10.10] 05/26/2017  . Schizoaffective disorder, bipolar type (HCC) [F25.0] 05/26/2017  . Polysubstance abuse (HCC) [F19.10]   . Altered mental state [R41.82] 05/08/2017  . Hallucinations [R44.3]   . Bipolar disorder, curr episode mixed, severe, with psychotic features (HCC) [F31.64] 11/02/2016  . PTSD (post-traumatic stress disorder) [F43.10] 11/02/2016  . GAD (generalized anxiety disorder) [F41.1] 10/16/2016  . Opioid use disorder, moderate, dependence (HCC) [F11.20] 06/06/2016  . Sedative, hypnotic or anxiolytic use disorder, severe, dependence (HCC) [F13.20] 06/06/2016  . Cocaine use disorder, severe, dependence (HCC) [F14.20] 06/06/2016  . Cannabis use disorder, severe, dependence (HCC) [F12.20] 06/06/2016  . MDD (major depressive disorder), recurrent severe, without psychosis (HCC) [F33.2] 06/03/2016  . Bipolar I disorder, most recent episode depressed (HCC) [F31.30] 07/04/2015  . Overdose [T50.901A] 07/02/2015  . Lactic acidosis [E87.2] 07/02/2015  . Acute respiratory failure (HCC) [J96.00] 07/02/2015  . Sepsis (HCC) [A41.9] 07/02/2015  . AKI (acute kidney injury) (HCC) [N17.9]   . Altered mental status [R41.82]   . Pyrexia [R50.9]   . Abdominal pain, epigastric [R10.13] 06/17/2015  . Hepatitis C antibody test positive [R76.8] 06/17/2015  . Peptic ulcer disease [K27.9] 06/17/2015  . Epigastric pain [R10.13]   . Transaminitis [R74.0] 06/16/2015  . History of substance abuse [Z87.898] 06/16/2015   History of Present Illness:   Evaluation of 27 year old woman with a history of bipolar versus schizoaffective disorder, substance abuse.  She presented to  the ER yesterday c/o of AH, SI and insomnia.  She reports the AH and low mood have been occurring for several days.  Her UDS was positive for opioids, cannabinoids and benzos.  She was being managed for opioid addiction by a suboxone clinic but she states it was too expensive, so she resumed opioid use.  She stopped her past psychiatric meds months ago.  Endorsed suicidal thoughts in ER.  Today Becky [name] said she did not sleep well and she is tired.  She continues to hear vocies.  Sometimes they want her to hurt herself, states she doesn't want to.Describes her depression and anxiety as 11/10.  States she feels restless and agitated.  She wishes she could sleep better.  Associated Signs/Symptoms: Depression Symptoms:  depressed mood, hypersomnia, psychomotor retardation, feelings of worthlessness/guilt, hopelessness, suicidal thoughts without plan, anxiety, (Hypo) Manic Symptoms:  Hallucinations, Impulsivity, Anxiety Symptoms:  Excessive Worry, Psychotic Symptoms:  Hallucinations: Auditory PTSD Symptoms: Need further assessment Total Time spent with patient: 45 minutes  Past Psychiatric History: multiple admissions, including at [facility] less than a month ago  Hx of suicide attempt In the past has reported using Drugs: Marijuana, Methamphetamines, MDMA (Ecstacy), Oxycodone, Benzodiazepines, and Cocaine. Past meds - this is not likely not a complete list - carbamazepine, gabapetnin, hydroxyzine, propranolol, quetiapine, sertraline, risperidone , suboxone. Sub abuse since age 45   Is the patient at risk to self? Yes.    Has the patient been a risk to self in the past 6 months? Yes.    Has the patient been a risk to self within the distant past? Yes.    Is the patient a risk to others? No.  Has the patient been a risk to others in the past 6 months? No.  Has the patient been a risk to others  within the distant past? No.   Alcohol Screening: 1. How often do you have a drink containing  alcohol?: 4 or more times a week 2. How many drinks containing alcohol do you have on a typical day when you are drinking?: 10 or more 3. How often do you have six or more drinks on one occasion?: Daily or almost daily AUDIT-C Score: 12 4. How often during the last year have you found that you were not able to stop drinking once you had started?: Daily or almost daily 5. How often during the last year have you failed to do what was normally expected from you becasue of drinking?: Daily or almost daily 6. How often during the last year have you needed a first drink in the morning to get yourself going after a heavy drinking session?: Daily or almost daily 7. How often during the last year have you had a feeling of guilt of remorse after drinking?: Daily or almost daily 8. How often during the last year have you been unable to remember what happened the night before because you had been drinking?: Weekly 9. Have you or someone else been injured as a result of your drinking?: No 10. Has a relative or friend or a doctor or another health worker been concerned about your drinking or suggested you cut down?: Yes, during the last year Alcohol Use Disorder Identification Test Final Score (AUDIT): 35 Intervention/Follow-up: Continued Monitoring, Medication Offered/Prescribed, Alcohol Education Substance Abuse History in the last 12 months:  yes Consequences of Substance Abuse: Medical Consequences:  toll on health Legal Consequences:  jail time Family Consequences:  estranged Blackouts:  hx of DT's: unclear Withdrawal Symptoms:   Cramps Diaphoresis Diarrhea Headaches Nausea Tremors Vomiting seizure  Past Medical History:  Past Medical History:  Diagnosis Date  . Bipolar 1 disorder (HCC)   . Bronchitis   . GAD (generalized anxiety disorder)   . Hepatitis C antibody test positive 06/17/2015  . History of substance abuse 06/16/2015  . Hypothyroidism   . OCD (obsessive compulsive disorder)   .  Peptic ulcer   . Polysubstance abuse (HCC)   . PTSD (post-traumatic stress disorder)     Past Surgical History:  Procedure Laterality Date  . OTHER SURGICAL HISTORY     scar tissue removed from right ovary   . OTHER SURGICAL HISTORY     fallopian tube repair  . WISDOM TOOTH EXTRACTION     Family History:  Family History  Problem Relation Age of Onset  . Drug abuse Brother   . Suicidality Brother   . Cancer Other   . Depression Mother   . Drug abuse Mother   . Alcoholism Father   . Bipolar disorder Cousin    Family Psychiatric  History: see above  Tobacco Screening: Have you used any form of tobacco in the last 30 days? (Cigarettes, Smokeless Tobacco, Cigars, and/or Pipes): Yes Tobacco use, Select all that apply: 5 or more cigarettes per day Are you interested in Tobacco Cessation Medications?: No, patient refused Counseled patient on smoking cessation including recognizing danger situations, developing coping skills and basic information about quitting provided: Refused/Declined practical counseling Social History: Hx of arrest and jail time. Social History   Substance and Sexual Activity  Alcohol Use Yes   Comment: occ     Social History   Substance and Sexual Activity  Drug Use Yes  . Types: Marijuana, Methamphetamines, MDMA (Ecstacy), Oxycodone, Benzodiazepines, Cocaine    Additional Social History: Marital status: Separated  Separated, when?:  4 1/2 years ago What types of issues is patient dealing with in the relationship?: they have been married three months. She left him three times. "I think she married him because she wanted a family. I know they drank alot in the three months they were married."  Additional relationship information: n/a  Are you sexually active?: Yes What is your sexual orientation?: straight Has your sexual activity been affected by drugs, alcohol, medication, or emotional stress?: pt reports decreased sex drive Does patient have children?:  Yes How many children?: 1 How is patient's relationship with their children?: pt reports he has been adopted                         Allergies:   Allergies  Allergen Reactions  . Azithromycin Anaphylaxis  . Nsaids Other (See Comments)    Reaction:  Stomach ulcers    . Tolmetin Other (See Comments)    Reaction:  Stomach ulcers    Lab Results:  Results for orders placed or performed during the hospital encounter of 05/26/17 (from the past 48 hour(s))  Lipid panel     Status: Abnormal   Collection Time: 05/27/17  9:15 AM  Result Value Ref Range   Cholesterol 182 0 - 200 mg/dL   Triglycerides 956 (H) <150 mg/dL   HDL 49 >21 mg/dL   Total CHOL/HDL Ratio 3.7 RATIO   VLDL 51 (H) 0 - 40 mg/dL   LDL Cholesterol 82 0 - 99 mg/dL    Comment:        Total Cholesterol/HDL:CHD Risk Coronary Heart Disease Risk Table                     Men   Women  1/2 Average Risk   3.4   3.3  Average Risk       5.0   4.4  2 X Average Risk   9.6   7.1  3 X Average Risk  23.4   11.0        Use the calculated Patient Ratio above and the CHD Risk Table to determine the patient's CHD Risk.        ATP III CLASSIFICATION (LDL):  <100     mg/dL   Optimal  308-657  mg/dL   Near or Above                    Optimal  130-159  mg/dL   Borderline  846-962  mg/dL   High  >952     mg/dL   Very High Performed at [facility], [address] [place], [place] [address]   TSH     Status: None   Collection Time: 05/27/17  9:15 AM  Result Value Ref Range   TSH 3.101 0.350 - 4.500 uIU/mL    Comment: Performed by a 3rd Generation assay with a functional sensitivity of <=0.01 uIU/mL. Performed at [facility], [address], [place], [place] [address]     Blood Alcohol level:  Lab Results  Component Value Date   [facility] <10 05/26/2017   ETH <10 05/08/2017    Metabolic Disorder Labs:  Lab Results  Component Value Date   HGBA1C 5.3 10/16/2016   MPG 105 10/16/2016   MPG 94  06/07/2016   Lab Results  Component Value Date   PROLACTIN 8.8 10/16/2016   PROLACTIN 49.9 (H) 06/07/2016   Lab Results  Component Value Date  CHOL 182 05/27/2017   TRIG 254 (H) 05/27/2017   HDL 49 05/27/2017   CHOLHDL 3.7 05/27/2017   VLDL 51 (H) 05/27/2017   LDLCALC 82 05/27/2017   LDLCALC 75 10/16/2016    Current Medications: Current Facility-Administered Medications  Medication Dose Route Frequency Provider Last Rate Last Dose  . acetaminophen (TYLENOL) tablet 650 mg  650 mg Oral Q6H PRN Clapacs, John T, MD      . alum & mag hydroxide-simeth (MAALOX/MYLANTA) 200-200-20 MG/5ML suspension 30 mL  30 mL Oral Q4H PRN Clapacs, Jackquline Denmark, MD   30 mL at 05/27/17 1337  . benztropine (COGENTIN) tablet 0.5 mg  0.5 mg Oral BID Clapacs, Jackquline Denmark, MD   0.5 mg at 05/27/17 0823  . buprenorphine-naloxone (SUBOXONE) 8-2 mg per SL tablet 1 tablet  1 tablet Sublingual Daily Clapacs, Jackquline Denmark, MD   1 tablet at 05/27/17 [phone]  . FLUoxetine (PROZAC) capsule 30 mg  30 mg Oral Daily Clapacs, Jackquline Denmark, MD   30 mg at 05/27/17 0824  . folic acid (FOLVITE) tablet 1 mg  1 mg Oral Daily Clapacs, Jackquline Denmark, MD   1 mg at 05/27/17 0824  . gabapentin (NEURONTIN) capsule 600 mg  600 mg Oral TID Clapacs, John T, MD   600 mg at 05/27/17 1221  . haloperidol (HALDOL) tablet 2 mg  2 mg Oral BID Clapacs, Jackquline Denmark, MD   2 mg at 05/27/17 0824  . levothyroxine (SYNTHROID, LEVOTHROID) tablet 50 mcg  50 mcg Oral QAC breakfast Clapacs, Jackquline Denmark, MD   50 mcg at 05/27/17 0824  . LORazepam (ATIVAN) tablet 1 mg  1 mg Oral Q6H PRN Clapacs, Jackquline Denmark, MD   1 mg at 05/27/17 0917   Or  . LORazepam (ATIVAN) injection 1 mg  1 mg Intravenous Q6H PRN Clapacs, John T, MD      . magnesium hydroxide (MILK OF MAGNESIA) suspension 30 mL  30 mL Oral Daily PRN Clapacs, John T, MD      . multivitamin with minerals tablet 1 tablet  1 tablet Oral Daily Clapacs, Jackquline Denmark, MD   1 tablet at 05/27/17 [phone]  . prazosin (MINIPRESS) capsule 2 mg  2 mg Oral QHS Clapacs, John  T, MD      . QUEtiapine (SEROQUEL) tablet 400 mg  400 mg Oral QHS Clapacs, John T, MD      . thiamine (VITAMIN B-1) tablet 100 mg  100 mg Oral Daily Clapacs, John T, MD   100 mg at 05/27/17 5409   Or  . thiamine (B-1) injection 100 mg  100 mg Intravenous Daily Clapacs, Jackquline Denmark, MD       PTA Medications: Medications Prior to Admission  Medication Sig Dispense Refill Last Dose  . benztropine (COGENTIN) 0.5 MG tablet Take 1 tablet (0.5 mg total) by mouth at bedtime. For prevention of drug induced tremors. 30 tablet 0 05/26/2017 at Unknown time  . Buprenorphine HCl-Naloxone HCl (SUBOXONE) 12-3 MG FILM Place 1 Film under the tongue every morning.   Past Week at Unknown time  . carbamazepine (TEGRETOL) 200 MG tablet take 1  Tablet by mouth 3 times daily FOR mood stabilization  0 Past Week at Unknown time  . FLUoxetine (PROZAC) 10 MG tablet Take 10 mg by mouth daily. Takes along with 20 mg to equal 30mg  daily   Past Week at Unknown time  . FLUoxetine (PROZAC) 20 MG tablet Take 20 mg by mouth daily. Takes with 10mg  to equal 30mg  daily  Past Week at Unknown time  . gabapentin (NEURONTIN) 600 MG tablet Take 600 mg by mouth 3 (three) times daily.   0 Past Week at Unknown time  . levothyroxine (SYNTHROID, LEVOTHROID) 50 MCG tablet Take 1 tablet (50 mcg total) by mouth daily before breakfast. For low thyroid Hormone 30 tablet 0 05/08/2017 at Unknown time  . mirtazapine (REMERON) 45 MG tablet Take 1 tablet (45 mg total) by mouth at bedtime. For depression/sleep (Patient taking differently: Take 90 mg by mouth at bedtime. For depression/sleep) 30 tablet 0 Past Month at Unknown time  . prazosin (MINIPRESS) 2 MG capsule Take 1 capsule (2 mg total) by mouth at bedtime. For nightmares 30 capsule 0 Past Month at Unknown time  . QUEtiapine (SEROQUEL) 300 MG tablet Take 2 tablets (600 mg total) by mouth at bedtime. For mood control (Patient taking differently: Take 600 mg by mouth at bedtime. 300mg  in morning, 300mg  in  afternoon and 400mg  at night.  For mood control) 60 tablet 0 05/08/2017 at Unknown time  . risperiDONE (RISPERDAL) 3 MG tablet Take 3 mg by mouth 2 (two) times daily.   05/08/2017 at Unknown time  . sertraline (ZOLOFT) 50 MG tablet Take 1 Tablet by mouth once daily FOR depression  0 Past Week at Unknown time    Musculoskeletal: Strength & Muscle Tone: within normal limits Gait & Station: normal Patient leans: N/A  Psychiatric Specialty Exam: Physical Exam  Constitutional: She appears well-developed.  HENT:  Head: Normocephalic and atraumatic.  Eyes: Conjunctivae are normal. Pupils are equal, round, and reactive to light.  Neck: Normal range of motion.  Cardiovascular: Regular rhythm and normal heart sounds.  Respiratory: Effort normal. No respiratory distress.  GI: Soft. She exhibits no distension.  Musculoskeletal: Normal range of motion.  Neurological: She is alert.  Skin: Skin is warm and dry.   Review of Systems  Constitutional: Negative for fever and malaise/fatigue.  HENT: Negative.   Eyes: Negative.   Respiratory: Negative for cough, shortness of breath and wheezing.   Cardiovascular: Negative for chest pain and palpitations.  Gastrointestinal: Negative for abdominal pain, constipation, diarrhea, nausea and vomiting.  Genitourinary: Negative.   Musculoskeletal: Negative for joint pain.  Skin: Negative for itching and rash.  Neurological: Negative.   Endo/Heme/Allergies: Negative.   Psychiatric/Behavioral: Positive for depression and suicidal ideas.    Blood pressure (!) 111/97, pulse 79, temperature 98.2 F (36.8 C), temperature source Oral, resp. rate 18, height 5\' 3"  (1.6 m), weight 82.6 kg (182 lb), SpO2 98 %.Body mass index is 32.24 kg/m.  General Appearance: Disheveled  Eye Contact:  Fair  Speech:  Normal Rate  Volume:  Normal  Mood:  Anxious  Affect:  anxious  Thought Process:  Coherent  Orientation:  Full (Time, Place, and Person)  Thought Content:   Logical  Suicidal Thoughts:  denies today  Homicidal Thoughts:  No  Memory:  Immediate;   Fair Recent;   Fair Remote;   Fair  Judgement:  Poor  Insight:  Lacking  Psychomotor Activity:  Normal  Concentration:  Concentration: Fair and Attention Span: Fair  Recall:  [employer] of Knowledge:  Poor  Language:  Fair  Akathisia:  No  Handed:  Right  AIMS (if indicated):  --  Assets:  Social Support  ADL's:  Intact  Cognition:  WNL  Sleep:  Number of Hours: 4.5    Treatment Plan Summary: 27 year old woman with schizoaffective disorder and substance abuse.  Poor insight to substance abuse and  in precontemplative stage of change.  Patient at moderate risk for self harm due to hx of suicide attempt, mental illness, substance abuse, impulsivity.  Protective factors are access to care, social support (bf). Inpt services for safety and stabilization   Disposition: Recommend psychiatric Inpatient admission when medically cleared. Supportive therapy provided about ongoing stressors.  #Schizoaffective disorder  -haloperidol 2mg  bid for mood stabilzation -fluoxetine 30mg  qday  For mood -congentin 0.5mg  bid for SE -gabapentin 600mg  tid for anxiety,can also help with abstaining from alcohol -quetiapine 400mg  qhs for mood stilization, sleep  --prazosin 2mg  qhs for ptsd symptoms   #alcohol withdrawal -CIWA lorazepam protocol with vitamin supplementation   #opioid withdrawal -Dr. Toni Amendlapacs has placed a suboxone taper   #hypothyroid --levothyroxine 50mcg TSH wnl   Short Term Goal(s): Improvement in symptoms so as ready for discharge, compliance with medications  Long Term Goals: Ability to identify changes in lifestyle to reduce recurrence of condition will improve, Ability to demonstrate self-control will improve, Compliance with prescribed medications will improve and Ability to identify triggers associated with substance abuse/mental health issues will improve  I certify that inpatient  services furnished can reasonably be expected to improve the patient's condition.    Cindee LameLauren M Brasen Bundren, MD 1/12/20193:05 PM

## 2017-05-27 NOTE — [facility] Suicide Risk Assessment (Signed)
[facility] Admission Suicide Risk Assessment   Nursing information obtained from:  Patient Demographic factors:  Caucasian Current Mental Status:  Suicidal ideation indicated by patient Loss Factors:  Legal issues, Financial problems / change in socioeconomic status, Loss of significant relationship Historical Factors:  Prior suicide attempts Risk Reduction Factors:  Positive coping skills or problem solving skills, Positive therapeutic relationship, Positive social support    Evaluation of  27 year old woman with a history of bipolar versus schizoaffective disorder, substance abuse.  She presented to the ER yesterday c/o of AH, SI and insomnia.  She reports the AH and low mood have been occurring for several days.  Her UDS was positive for opioids, cannabinoids and benzos.  She was being managed for opioid addiction by a suboxone clinic but she states it was too expensive, so she resumed opioid use.  She stopped her past psychiatric meds months ago.  Endorsed suicidal thoughts in ER.    Total Time spent with patient: 45 minutes Principal Problem: <principal problem not specified> Diagnosis:   Patient Active Problem List   Diagnosis Date Noted  . Alcohol abuse [F10.10] 05/26/2017  . Schizoaffective disorder, bipolar type (HCC) [F25.0] 05/26/2017  . Polysubstance abuse (HCC) [F19.10]   . Altered mental state [R41.82] 05/08/2017  . Hallucinations [R44.3]   . Bipolar disorder, curr episode mixed, severe, with psychotic features (HCC) [F31.64] 11/02/2016  . PTSD (post-traumatic stress disorder) [F43.10] 11/02/2016  . GAD (generalized anxiety disorder) [F41.1] 10/16/2016  . Opioid use disorder, moderate, dependence (HCC) [F11.20] 06/06/2016  . Sedative, hypnotic or anxiolytic use disorder, severe, dependence (HCC) [F13.20] 06/06/2016  . Cocaine use disorder, severe, dependence (HCC) [F14.20] 06/06/2016  . Cannabis use disorder, severe, dependence (HCC) [F12.20] 06/06/2016  . MDD (major  depressive disorder), recurrent severe, without psychosis (HCC) [F33.2] 06/03/2016  . Bipolar I disorder, most recent episode depressed (HCC) [F31.30] 07/04/2015  . Overdose [T50.901A] 07/02/2015  . Lactic acidosis [E87.2] 07/02/2015  . Acute respiratory failure (HCC) [J96.00] 07/02/2015  . Sepsis (HCC) [A41.9] 07/02/2015  . AKI (acute kidney injury) (HCC) [N17.9]   . Altered mental status [R41.82]   . Pyrexia [R50.9]   . Abdominal pain, epigastric [R10.13] 06/17/2015  . Hepatitis C antibody test positive [R76.8] 06/17/2015  . Peptic ulcer disease [K27.9] 06/17/2015  . Epigastric pain [R10.13]   . Transaminitis [R74.0] 06/16/2015  . History of substance abuse [Z87.898] 06/16/2015    Continued Clinical Symptoms:  Alcohol Use Disorder Identification Test Final Score (AUDIT): 35 The "Alcohol Use Disorders Identification Test", Guidelines for Use in Primary Care, Second Edition.  World Science writer [facility]). Score between 0-7:  no or low risk or alcohol related problems. Score between 8-15:  moderate risk of alcohol related problems. Score between 16-19:  high risk of alcohol related problems. Score 20 or above:  warrants further diagnostic evaluation for alcohol dependence and treatment.   CLINICAL FACTORS:   Severe Anxiety and/or Agitation Depression:   Aggression Comorbid alcohol abuse/dependence Alcohol/Substance Abuse/Dependencies Schizophrenia:   Less than 13 years old More than one psychiatric diagnosis Unstable or Poor Therapeutic Relationship Previous Psychiatric Diagnoses and Treatments Medical Diagnoses and Treatments/Surgeries    Physical Exam  Constitutional: She appears well-developed.  HENT:  Head:Normocephalicand atraumatic.  Eyes:Conjunctivaeare normal. Pupils are equal, round, and reactive to light.  Neck:Normal range of motion.  Cardiovascular:Regular rhythmand normal heart sounds.  Respiratory:Effort normal. Norespiratory distress.  ZO:XWRU.  She exhibitsno distension.  Musculoskeletal:Normal range of motion.  Neurological: She isalert.  Skin: Skin iswarmand dry.   Review  of Systems  Constitutional: Negative for fever and malaise/fatigue.  HENT: Negative.   Eyes: Negative.   Respiratory: Negative for cough, shortness of breath and wheezing.   Cardiovascular: Negative for chest pain and palpitations.  Gastrointestinal: Negative for abdominal pain, constipation, diarrhea, nausea and vomiting.  Genitourinary: Negative.   Musculoskeletal: Negative for joint pain.  Skin: Negative for itching and rash.  Neurological: Negative.   Endo/Heme/Allergies: Negative.   Psychiatric/Behavioral: Positive for depression and suicidal ideas.    Blood pressure (!) 111/97, pulse 79, temperature 98.2 F (36.8 C), temperature source Oral, resp. rate 18, height 5\' 3"  (1.6 m), weight 82.6 kg (182 lb), SpO2 98 %.Body mass index is 32.24 kg/m.  General Appearance: Disheveled  Eye Contact:  Fair  Speech:  Normal Rate  Volume:  Normal  Mood:  Anxious  Affect:  anxious  Thought Process:  Coherent  Orientation:  Full (Time, Place, and Person)  Thought Content:  Logical  Suicidal Thoughts:  denies today  Homicidal Thoughts:  No  Memory:  Immediate;   Fair Recent;   Fair Remote;   Fair  Judgement:  Poor  Insight:  Lacking  Psychomotor Activity:  Normal  Concentration:  Concentration: Fair and Attention Span: Fair  Recall:  FiservFair  Fund of Knowledge:  Poor  Language:  Fair  Akathisia:  No  Handed:  Right  AIMS (if indicated):  --  Assets:  Social Support  ADL's:  Intact  Cognition:  WNL  Sleep 4.5 hours       COGNITIVE FEATURES THAT CONTRIBUTE TO RISK:  Loss of executive function and Thought constriction (tunnel vision)    SUICIDE RISK:   Moderate:  Frequent suicidal ideation with limited intensity, and duration, some specificity in terms of plans, no associated intent, good self-control, limited dysphoria/symptomatology, some  risk factors present, and identifiable protective factors, including available and accessible social support.  Patient at moderate risk for self harm due to hx of suicide attempt, mental illness, substance abuse, impulsivity.  Protective factors are access to care, social support (bf). Inpt services for safety and stabilization     PLAN OF CARE: Admit to inpt unit to restart meds and for safety and stabilization and detox.  See h&p for specific plan   I certify that inpatient services furnished can reasonably be expected to improve the patient's condition.   Cindee LameLauren M Lamont Tant, MD 05/27/2017, 4:32 PM

## 2017-05-27 NOTE — [facility] Group Notes (Signed)
LCSW Group Therapy Note  05/27/2017 1:15pm  Type of Therapy and Topic:  Group Therapy:  Cognitive Distortions  Participation Level:  None   Description of Group:    Patients in this group will be introduced to the topic of cognitive distortions.  Patients will identify and describe cognitive distortions, describe the feelings these distortions create for them.  Patients will identify one or more situations in their personal life where they have cognitively distorted thinking and will verbalize challenging this cognitive distortion through positive thinking skills.  Patients will practice the skill of using positive affirmations to challenge cognitive distortions using affirmation cards.    Therapeutic Goals:  1. Patient will identify two or more cognitive distortions they have used 2. Patient will identify one or more emotions that stem from use of a cognitive distortion 3. Patient will demonstrate use of a positive affirmation to counter a cognitive distortion through discussion and/or role play. 4. Patient will describe one way cognitive distortions can be detrimental to wellness   Summary of Patient Progress: Pt attended group but did not participate. Pt spent most of the time with her eyes closed or outside the room.      Therapeutic Modalities:   Cognitive Behavioral Therapy Motivational Interviewing   Tekia Waterbury  CUEBAS-COLON, LCSW 05/27/2017 11:21 AM

## 2017-05-27 NOTE — Plan of Care (Signed)
Patient verbalizes understanding of the general information that's been provided to her and does not voice any other questions or concerns at this time. Patient states that she has suicidal/homicidal thoughts "every now and then", but she does contract for safety at this time. Patient also states that she's restless and "the voices in my head won't stop", patient also states "it's hard to explain, somebody is trying to hurt my whole family". Patient verbalizes understanding of her condition at this time. Patient is working towards what is needed of her in order to be discharged. Patient is safe on the unit at this time.

## 2017-05-27 NOTE — Plan of Care (Signed)
Patient oriented to unit. Patient endorses Passive SI at this time. Patient contracts for safety at this time.   Progressing Education: Knowledge of [place] [facility] Education information/materials will improve 05/27/2017 2036 - Progressing by Berkley Harveyeynolds, Ryka Beighley I, RN Self-Concept: Ability to disclose and discuss suicidal ideas will improve 05/27/2017 2036 - Progressing by Berkley Harveyeynolds, Ballard Budney I, RN

## 2017-05-27 NOTE — Progress Notes (Signed)
D- Patient alert and oriented. Patient presents to be very drowsy this morning and is stating that she is in a restless/agitated mood on assessment. Patient states that "the voices in my head won't stop" and when this writer asked the patient what the voices are saying, patient stated "it's hard to explain, somebody is trying to hurt my family". Patient is very preoccupied and tangential reporting that "my stomach hurts, my head hurts, I have acid reflux". Patient rates her depression/anxiety level a "11/10" but can't explain to this writer what is causing her to feel this way. When asked if she is having thoughts of hurting herself or anybody else, patient states "every now and then", but she does contract for safety with this Clinical research associatewriter. Patient's goal for today is to "get settled in here", which she will accomplish by "talk to staff & doctor".  A- Scheduled medications administered to patient, per MD orders. Support and encouragement provided.  Routine safety checks conducted every 15 minutes.  Patient informed to notify staff with problems or concerns.  R- No adverse drug reactions noted. Patient contracts for safety at this time. Patient compliant with medications and treatment plan. Patient receptive, calm, and cooperative. Patient interacts well with others on the unit.  Patient remains safe at this time.
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
## 2017-05-28 MED ORDER — HALOPERIDOL 2 MG PO TABS
2.0000 mg | ORAL_TABLET | Freq: Once | ORAL | Status: AC
  Administered 2017-05-28: 2 mg via ORAL
  Filled 2017-05-28: qty 1

## 2017-05-28 MED ORDER — HALOPERIDOL 5 MG PO TABS
5.0000 mg | ORAL_TABLET | Freq: Once | ORAL | Status: AC
  Administered 2017-05-28: 5 mg via ORAL
  Filled 2017-05-28: qty 1

## 2017-05-28 MED ORDER — HALOPERIDOL 5 MG PO TABS
5.0000 mg | ORAL_TABLET | Freq: Two times a day (BID) | ORAL | Status: DC
  Administered 2017-05-29 – 2017-05-30 (×3): 5 mg via ORAL
  Filled 2017-05-28 (×3): qty 1

## 2017-05-28 NOTE — Plan of Care (Signed)
Patient verbalizes understanding of her condition as well as the general information that's been provided to her without any further questions or concerns at this time. Patient denies SI/HI/AVH at this time, but does endorse signs/symptoms of depression and anxiety rating her levels a "9/10" stating "my brain is screwy because my meds aren't back in my system yet". Patient's goal for today is "finding a goal and sticking to it", which she hopes to accomplish this by "talk to people, get on computer". Patient is safe on the unit at this time.

## 2017-05-28 NOTE — Progress Notes (Signed)
[facility] MD Progress Note  05/28/2017 12:18 PM Ashlei Dailynn Nancarrow  MRN:  [id number] Subjective:  Ms. Durrett is laying in bed but sits up when I come in.  She states her anxiety is moderate and she doesn't feel like herself.  Has an odd affect.  She states she thinks more haloperidol will be helpful - currently on 2mg  bid - we agree to change it to 5mg  bid.  I read all of her meds to her and she was happy with all the rest.  Denies avh/si/hi  She talks about her son, that he's been taken away, "if I get clean I'll get him back."  She wants to get clean, get a license, get a job.   Had a migraine this earlier this morning, aggravated by lights, but resolved when I saw her.  Principal Problem: <principal problem not specified> Diagnosis:   Patient Active Problem List   Diagnosis Date Noted  . Alcohol abuse [F10.10] 05/26/2017  . Schizoaffective disorder, bipolar type (HCC) [F25.0] 05/26/2017  . Polysubstance abuse (HCC) [F19.10]   . Altered mental state [R41.82] 05/08/2017  . Hallucinations [R44.3]   . Bipolar disorder, curr episode mixed, severe, with psychotic features (HCC) [F31.64] 11/02/2016  . PTSD (post-traumatic stress disorder) [F43.10] 11/02/2016  . GAD (generalized anxiety disorder) [F41.1] 10/16/2016  . Opioid use disorder, moderate, dependence (HCC) [F11.20] 06/06/2016  . Sedative, hypnotic or anxiolytic use disorder, severe, dependence (HCC) [F13.20] 06/06/2016  . Cocaine use disorder, severe, dependence (HCC) [F14.20] 06/06/2016  . Cannabis use disorder, severe, dependence (HCC) [F12.20] 06/06/2016  . MDD (major depressive disorder), recurrent severe, without psychosis (HCC) [F33.2] 06/03/2016  . Bipolar I disorder, most recent episode depressed (HCC) [F31.30] 07/04/2015  . Overdose [T50.901A] 07/02/2015  . Lactic acidosis [E87.2] 07/02/2015  . Acute respiratory failure (HCC) [J96.00] 07/02/2015  . Sepsis (HCC) [A41.9] 07/02/2015  . AKI (acute kidney injury) (HCC)  [N17.9]   . Altered mental status [R41.82]   . Pyrexia [R50.9]   . Abdominal pain, epigastric [R10.13] 06/17/2015  . Hepatitis C antibody test positive [R76.8] 06/17/2015  . Peptic ulcer disease [K27.9] 06/17/2015  . Epigastric pain [R10.13]   . Transaminitis [R74.0] 06/16/2015  . History of substance abuse [Z87.898] 06/16/2015   Total Time spent with patient: 20 minutes  Past Psychiatric History: see h&p  Past Medical History:  Past Medical History:  Diagnosis Date  . Bipolar 1 disorder (HCC)   . Bronchitis   . GAD (generalized anxiety disorder)   . Hepatitis C antibody test positive 06/17/2015  . History of substance abuse 06/16/2015  . Hypothyroidism   . OCD (obsessive compulsive disorder)   . Peptic ulcer   . Polysubstance abuse (HCC)   . PTSD (post-traumatic stress disorder)     Past Surgical History:  Procedure Laterality Date  . OTHER SURGICAL HISTORY     scar tissue removed from right ovary   . OTHER SURGICAL HISTORY     fallopian tube repair  . WISDOM TOOTH EXTRACTION     Family History:  Family History  Problem Relation Age of Onset  . Drug abuse Brother   . Suicidality Brother   . Cancer Other   . Depression Mother   . Drug abuse Mother   . Alcoholism Father   . Bipolar disorder Cousin    Family Psychiatric  History: see h&p Social History:  Social History   Substance and Sexual Activity  Alcohol Use Yes   Comment: occ     Social History  Substance and Sexual Activity  Drug Use Yes  . Types: Marijuana, Methamphetamines, MDMA (Ecstacy), Oxycodone, Benzodiazepines, Cocaine    Social History   Socioeconomic History  . Marital status: Legally Separated    Spouse name: None  . Number of children: None  . Years of education: None  . Highest education level: None  Social Needs  . Financial resource strain: None  . Food insecurity - worry: None  . Food insecurity - inability: None  . Transportation needs - medical: None  . Transportation  needs - non-medical: None  Occupational History  . None  Tobacco Use  . Smoking status: Current Every Day Smoker    Packs/day: 1.00    Years: 10.00    Pack years: 10.00    Types: Cigarettes  . Smokeless tobacco: Never Used  Substance and Sexual Activity  . Alcohol use: Yes    Comment: occ  . Drug use: Yes    Types: Marijuana, Methamphetamines, MDMA (Ecstacy), Oxycodone, Benzodiazepines, Cocaine  . Sexual activity: Yes    Birth control/protection: None  Other Topics Concern  . None  Social History Narrative  . None   Additional Social History:                         Sleep: Good  Appetite:  Good  Current Medications: Current Facility-Administered Medications  Medication Dose Route Frequency Provider Last Rate Last Dose  . acetaminophen (TYLENOL) tablet 650 mg  650 mg Oral Q6H PRN Clapacs, Jackquline Denmark, MD   650 mg at 05/28/17 0629  . alum & mag hydroxide-simeth (MAALOX/MYLANTA) 200-200-20 MG/5ML suspension 30 mL  30 mL Oral Q4H PRN Clapacs, John T, MD   30 mL at 05/28/17 1001  . benztropine (COGENTIN) tablet 0.5 mg  0.5 mg Oral BID Clapacs, Jackquline Denmark, MD   0.5 mg at 05/28/17 0814  . FLUoxetine (PROZAC) capsule 30 mg  30 mg Oral Daily Clapacs, Jackquline Denmark, MD   30 mg at 05/28/17 0814  . folic acid (FOLVITE) tablet 1 mg  1 mg Oral Daily Clapacs, Jackquline Denmark, MD   1 mg at 05/28/17 0814  . gabapentin (NEURONTIN) capsule 600 mg  600 mg Oral TID Clapacs, Jackquline Denmark, MD   600 mg at 05/28/17 1214  . [START ON 05/29/2017] haloperidol (HALDOL) tablet 5 mg  5 mg Oral BID Cindee Lame, MD      . haloperidol (HALDOL) tablet 5 mg  5 mg Oral Once Cindee Lame, MD      . levothyroxine (SYNTHROID, LEVOTHROID) tablet 50 mcg  50 mcg Oral QAC breakfast Clapacs, Jackquline Denmark, MD   50 mcg at 05/28/17 0815  . LORazepam (ATIVAN) tablet 1 mg  1 mg Oral Q6H PRN Clapacs, Jackquline Denmark, MD   1 mg at 05/27/17 1823   Or  . LORazepam (ATIVAN) injection 1 mg  1 mg Intravenous Q6H PRN Clapacs, John T, MD      . magnesium  hydroxide (MILK OF MAGNESIA) suspension 30 mL  30 mL Oral Daily PRN Clapacs, John T, MD      . multivitamin with minerals tablet 1 tablet  1 tablet Oral Daily Clapacs, Jackquline Denmark, MD   1 tablet at 05/28/17 0814  . nicotine (NICODERM CQ - dosed in mg/24 hours) patch 21 mg  21 mg Transdermal Daily Cindee Lame, MD   21 mg at 05/28/17 0815  . prazosin (MINIPRESS) capsule 2 mg  2 mg Oral QHS Clapacs, John  T, MD   2 mg at 05/27/17 2103  . QUEtiapine (SEROQUEL) tablet 400 mg  400 mg Oral QHS Clapacs, John T, MD   400 mg at 05/27/17 2103  . thiamine (VITAMIN B-1) tablet 100 mg  100 mg Oral Daily Clapacs, John T, MD   100 mg at 05/28/17 1610   Or  . thiamine (B-1) injection 100 mg  100 mg Intravenous Daily Clapacs, Jackquline Denmark, MD        Lab Results:  Results for orders placed or performed during the hospital encounter of 05/26/17 (from the past 48 hour(s))  Hemoglobin A1c     Status: None   Collection Time: 05/27/17  9:15 AM  Result Value Ref Range   Hgb A1c MFr Bld 5.3 4.8 - 5.6 %    Comment: (NOTE) Pre diabetes:          5.7%-6.4% Diabetes:              >6.4% Glycemic control for   <7.0% adults with diabetes    Mean Plasma Glucose 105.41 mg/dL    Comment: Performed at [facility], [address] [place], [place] [address]  Lipid panel     Status: Abnormal   Collection Time: 05/27/17  9:15 AM  Result Value Ref Range   Cholesterol 182 0 - 200 mg/dL   Triglycerides 409 (H) <150 mg/dL   HDL 49 >81 mg/dL   Total CHOL/HDL Ratio 3.7 RATIO   VLDL 51 (H) 0 - 40 mg/dL   LDL Cholesterol 82 0 - 99 mg/dL    Comment:        Total Cholesterol/HDL:CHD Risk Coronary Heart Disease Risk Table                     Men   Women  1/2 Average Risk   3.4   3.3  Average Risk       5.0   4.4  2 X Average Risk   9.6   7.1  3 X Average Risk  23.4   11.0        Use the calculated Patient Ratio above and the CHD Risk Table to determine the patient's CHD Risk.        ATP III CLASSIFICATION (LDL):  <100      mg/dL   Optimal  191-478  mg/dL   Near or Above                    Optimal  130-159  mg/dL   Borderline  295-621  mg/dL   High  >308     mg/dL   Very High Performed at [facility], [address] [place], [place] [address]   TSH     Status: None   Collection Time: 05/27/17  9:15 AM  Result Value Ref Range   TSH 3.101 0.350 - 4.500 uIU/mL    Comment: Performed by a 3rd Generation assay with a functional sensitivity of <=0.01 uIU/mL. Performed at [facility], [address] [place], [place] [address]     Blood Alcohol level:  Lab Results  Component Value Date   [facility] <10 05/26/2017   ETH <10 05/08/2017    Metabolic Disorder Labs: Lab Results  Component Value Date   HGBA1C 5.3 05/27/2017   MPG 105.41 05/27/2017   MPG 105 10/16/2016   Lab Results  Component Value Date   PROLACTIN 8.8 10/16/2016   PROLACTIN 49.9 (H) 06/07/2016   Lab  Results  Component Value Date   CHOL 182 05/27/2017   TRIG 254 (H) 05/27/2017   HDL 49 05/27/2017   CHOLHDL 3.7 05/27/2017   VLDL 51 (H) 05/27/2017   LDLCALC 82 05/27/2017   LDLCALC 75 10/16/2016    Physical Findings: AIMS: Facial and Oral Movements Muscles of Facial Expression: None, normal Lips and Perioral Area: None, normal Jaw: None, normal Tongue: None, normal,Extremity Movements Upper (arms, wrists, hands, fingers): None, normal Lower (legs, knees, ankles, toes): None, normal, Trunk Movements Neck, shoulders, hips: None, normal, Overall Severity Severity of abnormal movements (highest score from questions above): None, normal Incapacitation due to abnormal movements: None, normal Patient's awareness of abnormal movements (rate only patient's report): No Awareness, Dental Status Current problems with teeth and/or dentures?: No Does patient usually wear dentures?: No  CIWA:  CIWA-Ar Total: 3 COWS:     Musculoskeletal: Strength & Muscle Tone: within normal limits Gait & Station: normal Patient  leans: N/A  Psychiatric Specialty Exam: Physical Exam  Review of Systems  Constitutional: Negative.   Respiratory: Negative.   Cardiovascular: Negative.   Gastrointestinal: Negative.   Neurological: Positive for headaches.    Blood pressure (!) 110/52, pulse 68, temperature 98.1 F (36.7 C), temperature source Oral, resp. rate 18, height 5\' 3"  (1.6 m), weight 82.6 kg (182 lb), SpO2 98 %.Body mass index is 32.24 kg/m.  General Appearance: Casual  Eye Contact:  Fair  Speech:  Clear and Coherent  Volume:  Normal  Mood:  Dysphoric  Affect:  Blunt and Inappropriate  Thought Process:  Coherent and Goal Directed  Orientation:  Full (Time, Place, and Person)  Thought Content:  Logical  Suicidal Thoughts:  No  Homicidal Thoughts:  No  Memory:  Immediate;   Fair Recent;   Fair Remote;   Fair  Judgement:  Impaired  Insight:  Shallow  Psychomotor Activity:  Normal  Concentration:  Concentration: Fair and Attention Span: Fair  Recall:  [employer] of Knowledge:  Fair  Language:  Poor  Akathisia:  No  Handed:  Right  AIMS (if indicated):     Assets:  Social Support  ADL's:  Intact  Cognition:  WNL  Sleep:  Number of Hours: 4.5    Treatment Plan Summary: 27 year old woman with schizoaffective disorder and substance abuse. Poor insight to substance abuse and in contemplative stage of change.  Patient at moderate risk for self harm due to hx of suicide attempt, mental illness, substance abuse, impulsivity.  Protective factors are access to care, social support (bf). Inpt services for safety and stabilization   Disposition:Recommend psychiatric Inpatient admission when medically cleared. Supportive therapy provided about ongoing stressors.  #Schizoaffective disorder -haloperidol increased to 5mg  bid  for mood stabilzation (given total dose 4mg  this morning and will start with 5mg  tonight) -while pt has elevated prolatin risk>benefit given her inability to function and  responsibly care for herself at this time  -fluoxetine 30mg  qday  For mood -congentin 0.5mg  bid for SE -gabapentin 600mg  tid for anxiety,can also help with abstaining from alcohol -quetiapine 400mg  qhs for mood stilization, sleep  --prazosin 2mg  qhs for ptsd symptoms   #alcohol withdrawal -CIWA lorazepam protocol with vitamin supplementation   #opioid withdrawal -Dr. Toni Amend has placed a suboxone taper   #hypothyroid --levothyroxine TSH wnl   Short Term Goal(s): Improvement in symptoms so as ready for discharge, compliance with medications  Long Term Goals: Ability to identify changes in lifestyle to reduce recurrence of condition will improve, Ability to demonstrate  self-control will improve, Compliance with prescribed medications will improve and Ability to identify triggers associated with substance abuse/mental health issues will improve  I certify that inpatient services furnished can reasonably be expected to improve the patient's condition.         Cindee LameLauren M Arel Tippen, MD 05/28/2017, 12:18 PM

## 2017-05-28 NOTE — Progress Notes (Signed)
D: Patient denies SI/HI/AVH. Patient verbally contracts for safety. Patient is calm, cooperative and pleasant. Patient is seen in milieu interacting with peers. Patient has complaint of GI pain, heartburn and headache. Patient provided with PRN medications.   A: Patient was assessed by this nurse. Patient was oriented to unit. Patient's safety was maintained on unit. Q x 15 minute observation checks were completed for safety. Patient care plan was reviewed. Patient was offered support and encouragement. Patient was encourage to attend groups, participate in unit activities and continue with plan of care.   R: Patient has no complaints of pain at this time. Patient is receptive to treatment and safety maintained on unit.

## 2017-05-28 NOTE — [facility] Group Notes (Signed)
[facility] Group Notes:  (Nursing/MHT/Case Management/Adjunct)  Date:  05/28/2017  Time:  5:37 AM  Type of Therapy:  Psychoeducational Skills  Participation Level:  Active  Participation Quality:  Appropriate, Attentive and Sharing  Affect:  Appropriate  Cognitive:  Appropriate  Insight:  Appropriate and Good  Engagement in Group:  Engaged  Modes of Intervention:  Discussion, Socialization and Support  Summary of Progress/Problems:  Chancy MilroyLaquanda Y Kerri-Anne Haeberle 05/28/2017, 5:37 AM

## 2017-05-28 NOTE — Plan of Care (Signed)
Patient is oriented to unit. Patient denies SI/HI/AVH.    Progressing Education: Knowledge of [facility] Education information/materials will improve 05/28/2017 2035 - Progressing by Berkley Harveyeynolds, Maalik Pinn I, RN Self-Concept: Ability to disclose and discuss suicidal ideas will improve 05/28/2017 2035 - Progressing by Berkley Harveyeynolds, Vale Mousseau I, RN

## 2017-05-28 NOTE — Progress Notes (Addendum)
D: Patient endorses passive SI. Patient stated that she was not hearing any "voices" at the moment but she had been hearing and see visual hallucinations today. Patient verbally contracts for safety. Patient is calm, cooperative and pleasant. Patient is seen in milieu interacting with peers. Patient has no complaints at this time. Patient CIWA scored <8 this shift.   A: Patient was assessed by this nurse. Patient was oriented to unit. Patient's safety was maintained on unit. Q x 15 minute observation checks were completed for safety. Patient care plan was reviewed. Patient was offered support and encouragement. Patient was encourage to attend groups, participate in unit activities and continue with plan of care.   R: Patient has no complaints of pain at this time. Patient is receptive to treatment and safety maintained on unit.

## 2017-05-28 NOTE — Progress Notes (Signed)
D- Patient alert and oriented. Patient presents in a pleasant mood but is slightly anxious and depressed on assessment stating "I slept alright last night, but it took me a while to fall asleep". Patient also states that her depression and anxiety is a "9/10, "my brain is screwy because my meds haven't got back in my system yet". Patient denies SI, HI, AVH, and pain at this time. Patient's goal for today is "finding a goal and sticking to it", which she hopes to accomplish this by "talk to people, get on computer".  A- Scheduled medications administered to patient, per MD orders. Support and encouragement provided.  Routine safety checks conducted every 15 minutes.  Patient informed to notify staff with problems or concerns.  R- No adverse drug reactions noted. Patient contracts for safety at this time. Patient compliant with medications and treatment plan. Patient receptive, calm, and cooperative. Patient interacts well with others on the unit.  Patient remains safe at this time.

## 2017-05-28 NOTE — [facility] Group Notes (Signed)
LCSW Group Therapy Note 05/28/2017 1:15pm  Type of Therapy and Topic: Group Therapy: Feelings Around Returning Home & Establishing a Supportive Framework and Supporting Oneself When Supports Not Available  Participation Level: Did Not Attend  Description of Group:  Patients first processed thoughts and feelings about upcoming discharge. These included fears of upcoming changes, lack of change, new living environments, judgements and expectations from others and overall stigma of mental health issues. The group then discussed the definition of a supportive framework, what that looks and feels like, and how do to discern it from an unhealthy non-supportive network. The group identified different types of supports as well as what to do when your family/friends are less than helpful or unavailable  Therapeutic Goals  1. Patient will identify one healthy supportive network that they can use at discharge. 2. Patient will identify one factor of a supportive framework and how to tell it from an unhealthy network. 3. Patient able to identify one coping skill to use when they do not have positive supports from others. 4. Patient will demonstrate ability to communicate their needs through discussion and/or role plays.  Summary of Patient Progress:    Therapeutic Modalities Cognitive Behavioral Therapy Motivational Interviewing   Angle Dirusso  CUEBAS-COLON, LCSW 05/28/2017 12:20 PM    

## 2017-05-29 LAB — CHLAMYDIA/NGC RT PCR (ARMC ONLY)
CHLAMYDIA TR: NOT DETECTED
N gonorrhoeae: NOT DETECTED

## 2017-05-29 LAB — URINALYSIS, COMPLETE (UACMP) WITH MICROSCOPIC
BILIRUBIN URINE: NEGATIVE
Glucose, UA: NEGATIVE mg/dL
HGB URINE DIPSTICK: NEGATIVE
Ketones, ur: NEGATIVE mg/dL
LEUKOCYTES UA: NEGATIVE
NITRITE: NEGATIVE
Protein, ur: NEGATIVE mg/dL
SPECIFIC GRAVITY, URINE: 1.008 (ref 1.005–1.030)
pH: 8 (ref 5.0–8.0)

## 2017-05-29 MED ORDER — BUPRENORPHINE HCL-NALOXONE HCL 8-2 MG SL SUBL
1.0000 | SUBLINGUAL_TABLET | Freq: Once | SUBLINGUAL | Status: AC
  Administered 2017-05-29: 1 via SUBLINGUAL
  Filled 2017-05-29: qty 1

## 2017-05-29 MED ORDER — MIRTAZAPINE 15 MG PO TABS
7.5000 mg | ORAL_TABLET | Freq: Every day | ORAL | Status: DC
Start: 2017-05-29 — End: 2017-06-02
  Administered 2017-05-29 – 2017-06-01 (×4): 7.5 mg via ORAL
  Filled 2017-05-29 (×4): qty 1

## 2017-05-29 MED ORDER — LOPERAMIDE HCL 2 MG PO CAPS
4.0000 mg | ORAL_CAPSULE | ORAL | Status: DC | PRN
  Administered 2017-05-30: 4 mg via ORAL
  Administered 2017-05-30 – 2017-06-01 (×2): 2 mg via ORAL
  Filled 2017-05-29 (×3): qty 2

## 2017-05-29 NOTE — Progress Notes (Signed)
[facility] MD Progress Note  05/29/2017 2:12 PM Becky [name]  MRN:  [id number] Subjective:  History was reviewed with patient. She states that she has been off medications for 2.5  Weeks because she ran out of them. She started having AH again. She stats that "they are telling me that my family will be hanged." She denies that they are telling her to harm herself or others. However, they are very distressing and she began feeling suicidal. She stats that she used to be on Suboxone but could not longer afford it so went back to using opiates. She states that she usually uses heroin but has been using high doses of IV oxycodone because her "dealer got busted." She states that she has son and she is able to be back in his life if she gets treatment. The difficult thing is that she has a pending charge and has court in March so will not be accepted into a residential treatment program. She states that she is homeless and has not were to go. She denies SI today and mood is slightly better. AH are still present but better. She is perseverative on getting Suboxone while in the hospital. She is also wanting to get back on Remeron which helped her sleep at night. She states, "Can I stay here until you get me into a program? I have nowhere to go." Discussed that it will be difficult due to pending charges but we could look into outpatient programs in the meantime.   Principal Problem: Schizoaffective disorder, bipolar type (HCC) Diagnosis:   Patient Active Problem List   Diagnosis Date Noted  . Schizoaffective disorder, bipolar type (HCC) [F25.0] 05/26/2017    Priority: High  . Alcohol abuse [F10.10] 05/26/2017  . Polysubstance abuse (HCC) [F19.10]   . Altered mental state [R41.82] 05/08/2017  . Hallucinations [R44.3]   . Bipolar disorder, curr episode mixed, severe, with psychotic features (HCC) [F31.64] 11/02/2016  . PTSD (post-traumatic stress disorder) [F43.10] 11/02/2016  . GAD (generalized anxiety  disorder) [F41.1] 10/16/2016  . Opioid use disorder, moderate, dependence (HCC) [F11.20] 06/06/2016  . Sedative, hypnotic or anxiolytic use disorder, severe, dependence (HCC) [F13.20] 06/06/2016  . Cocaine use disorder, severe, dependence (HCC) [F14.20] 06/06/2016  . Cannabis use disorder, severe, dependence (HCC) [F12.20] 06/06/2016  . MDD (major depressive disorder), recurrent severe, without psychosis (HCC) [F33.2] 06/03/2016  . Bipolar I disorder, most recent episode depressed (HCC) [F31.30] 07/04/2015  . Overdose [T50.901A] 07/02/2015  . Lactic acidosis [E87.2] 07/02/2015  . Acute respiratory failure (HCC) [J96.00] 07/02/2015  . Sepsis (HCC) [A41.9] 07/02/2015  . AKI (acute kidney injury) (HCC) [N17.9]   . Altered mental status [R41.82]   . Pyrexia [R50.9]   . Abdominal pain, epigastric [R10.13] 06/17/2015  . Hepatitis C antibody test positive [R76.8] 06/17/2015  . Peptic ulcer disease [K27.9] 06/17/2015  . Epigastric pain [R10.13]   . Transaminitis [R74.0] 06/16/2015  . History of substance abuse [Z87.898] 06/16/2015  3 Total Time spent with patient: 20 minutes  Past Psychiatric History: See H&P  Past Medical History:  Past Medical History:  Diagnosis Date  . Bipolar 1 disorder (HCC)   . Bronchitis   . GAD (generalized anxiety disorder)   . Hepatitis C antibody test positive 06/17/2015  . History of substance abuse 06/16/2015  . Hypothyroidism   . OCD (obsessive compulsive disorder)   . Peptic ulcer   . Polysubstance abuse (HCC)   . PTSD (post-traumatic stress disorder)     Past Surgical History:  Procedure  Laterality Date  . OTHER SURGICAL HISTORY     scar tissue removed from right ovary   . OTHER SURGICAL HISTORY     fallopian tube repair  . WISDOM TOOTH EXTRACTION     Family History:  Family History  Problem Relation Age of Onset  . Drug abuse Brother   . Suicidality Brother   . Cancer Other   . Depression Mother   . Drug abuse Mother   . Alcoholism Father    . Bipolar disorder Cousin    Family Psychiatric  History: See H&P  Social History:  Social History   Substance and Sexual Activity  Alcohol Use Yes   Comment: occ     Social History   Substance and Sexual Activity  Drug Use Yes  . Types: Marijuana, Methamphetamines, MDMA (Ecstacy), Oxycodone, Benzodiazepines, Cocaine    Social History   Socioeconomic History  . Marital status: Legally Separated    Spouse name: None  . Number of children: None  . Years of education: None  . Highest education level: None  Social Needs  . Financial resource strain: None  . Food insecurity - worry: None  . Food insecurity - inability: None  . Transportation needs - medical: None  . Transportation needs - non-medical: None  Occupational History  . None  Tobacco Use  . Smoking status: Current Every Day Smoker    Packs/day: 1.00    Years: 10.00    Pack years: 10.00    Types: Cigarettes  . Smokeless tobacco: Never Used  Substance and Sexual Activity  . Alcohol use: Yes    Comment: occ  . Drug use: Yes    Types: Marijuana, Methamphetamines, MDMA (Ecstacy), Oxycodone, Benzodiazepines, Cocaine  . Sexual activity: Yes    Birth control/protection: None  Other Topics Concern  . None  Social History Narrative  . None   Additional Social History:                         Sleep: Fair  Appetite:  Good  Current Medications: Current Facility-Administered Medications  Medication Dose Route Frequency Provider Last Rate Last Dose  . acetaminophen (TYLENOL) tablet 650 mg  650 mg Oral Q6H PRN Clapacs, Jackquline Denmark, MD   650 mg at 05/29/17 1208  . alum & mag hydroxide-simeth (MAALOX/MYLANTA) 200-200-20 MG/5ML suspension 30 mL  30 mL Oral Q4H PRN Clapacs, Jackquline Denmark, MD   30 mL at 05/29/17 1208  . benztropine (COGENTIN) tablet 0.5 mg  0.5 mg Oral BID Clapacs, Jackquline Denmark, MD   0.5 mg at 05/29/17 0820  . FLUoxetine (PROZAC) capsule 30 mg  30 mg Oral Daily Clapacs, Jackquline Denmark, MD   30 mg at 05/29/17 4098   . folic acid (FOLVITE) tablet 1 mg  1 mg Oral Daily Clapacs, Jackquline Denmark, MD   1 mg at 05/29/17 1191  . gabapentin (NEURONTIN) capsule 600 mg  600 mg Oral TID Clapacs, John T, MD   600 mg at 05/29/17 1208  . haloperidol (HALDOL) tablet 5 mg  5 mg Oral BID Cindee Lame, MD   5 mg at 05/29/17 1405  . levothyroxine (SYNTHROID, LEVOTHROID) tablet 50 mcg  50 mcg Oral QAC breakfast Clapacs, Jackquline Denmark, MD   50 mcg at 05/29/17 0820  . LORazepam (ATIVAN) tablet 1 mg  1 mg Oral Q6H PRN Clapacs, Jackquline Denmark, MD   1 mg at 05/29/17 4782   Or  . LORazepam (ATIVAN) injection 1 mg  1 mg Intravenous Q6H PRN Clapacs, John T, MD      . magnesium hydroxide (MILK OF MAGNESIA) suspension 30 mL  30 mL Oral Daily PRN Clapacs, John T, MD      . mirtazapine (REMERON) tablet 7.5 mg  7.5 mg Oral QHS Bethanee Redondo, Ileene HutchinsonHolly R, MD      . multivitamin with minerals tablet 1 tablet  1 tablet Oral Daily Clapacs, Jackquline DenmarkJohn T, MD   1 tablet at 05/29/17 0820  . nicotine (NICODERM CQ - dosed in mg/24 hours) patch 21 mg  21 mg Transdermal Daily Cindee LameIsbell, Lauren M, MD   21 mg at 05/29/17 0819  . prazosin (MINIPRESS) capsule 2 mg  2 mg Oral QHS Clapacs, Jackquline DenmarkJohn T, MD   2 mg at 05/28/17 2112  . QUEtiapine (SEROQUEL) tablet 400 mg  400 mg Oral QHS Clapacs, Jackquline DenmarkJohn T, MD   400 mg at 05/28/17 2112  . thiamine (VITAMIN B-1) tablet 100 mg  100 mg Oral Daily Clapacs, John T, MD   100 mg at 05/29/17 [id number]   Or  . thiamine (B-1) injection 100 mg  100 mg Intravenous Daily Clapacs, Jackquline DenmarkJohn T, MD        Lab Results: No results found for this or any previous visit (from the past 48 hour(s)).  Blood Alcohol level:  Lab Results  Component Value Date   ETH <10 05/26/2017   ETH <10 05/08/2017    Metabolic Disorder Labs: Lab Results  Component Value Date   HGBA1C 5.3 05/27/2017   MPG 105.41 05/27/2017   MPG 105 10/16/2016   Lab Results  Component Value Date   PROLACTIN 8.8 10/16/2016   PROLACTIN 49.9 (H) 06/07/2016   Lab Results  Component Value Date   CHOL 182  05/27/2017   TRIG 254 (H) 05/27/2017   HDL 49 05/27/2017   CHOLHDL 3.7 05/27/2017   VLDL 51 (H) 05/27/2017   LDLCALC 82 05/27/2017   LDLCALC 75 10/16/2016    Physical Findings: AIMS: Facial and Oral Movements Muscles of Facial Expression: None, normal Lips and Perioral Area: None, normal Jaw: None, normal Tongue: None, normal,Extremity Movements Upper (arms, wrists, hands, fingers): None, normal Lower (legs, knees, ankles, toes): None, normal, Trunk Movements Neck, shoulders, hips: None, normal, Overall Severity Severity of abnormal movements (highest score from questions above): None, normal Incapacitation due to abnormal movements: None, normal Patient's awareness of abnormal movements (rate only patient's report): No Awareness, Dental Status Current problems with teeth and/or dentures?: No Does patient usually wear dentures?: No  CIWA:  CIWA-Ar Total: 0 COWS:     Musculoskeletal: Strength & Muscle Tone: within normal limits Gait & Station: normal Patient leans: N/A  Psychiatric Specialty Exam: Physical Exam  Nursing note and vitals reviewed.   Review of Systems  All other systems reviewed and are negative.   Blood pressure 118/71, pulse 99, temperature 97.8 F (36.6 C), temperature source Oral, resp. rate 18, height 5\' 3"  (1.6 m), weight 82.6 kg (182 lb), SpO2 98 %.Body mass index is 32.24 kg/m.  General Appearance: Casual  Eye Contact:  Good  Speech:  Clear and Coherent  Volume:  Normal  Mood:  Depressed  Affect:  Flat  Thought Process:  Coherent and Goal Directed  Orientation:  Full (Time, Place, and Person)  Thought Content:  Logical, AH  Suicidal Thoughts:  Yes.  without intent/plan  Homicidal Thoughts:  No  Memory:  Immediate;   Fair  Judgement:  Fair  Insight:  Fair  Psychomotor Activity:  Normal  Concentration:  Concentration: Fair  Recall:  [employer] of Knowledge:  Fair  Language:  Fair  Akathisia:  No      Assets:  Communication  Skills Desire for Improvement Resilience  ADL's:  Intact  Cognition:  WNL  Sleep:  Number of Hours: 4.5     Treatment Plan Summary: 27 yo female admitted due to SI and AH in the context of medication noncompliance and drug abuse including opiates, benzos, and alcohol. Mood is slightly better today and AH improved. She is very organized and goal directed and although she reports AH she does not appear psychotic or to be responding to internal stimuli. She is perseverative on wanting a treatment program and that she does not have anywhere to go on discharge. She is also perseverative on getting Suboxone while in the hospital.   Plan:  Mood/psychosis -Continue Haldol 5 mg BID. This was increased over the weekend -Start Remeron 7.5 mg qhs for depression and sleep -Continue Prozac 30 mg daily -continue Seroquel 400 mg qhs  Nightmares -Continue Prazosin 2 mg qhs  Alcohol/benzo/ opiate abuse -One time dose of Suboxone to complete taper -Continue CIWA with Ativan  Hypothyroid -Continue Synthroid 50 mcg  Dispo -Discharge destination unclear at this time. She is homeless and has pending charges  Haskell Riling, MD 05/29/2017, 2:12 PM

## 2017-05-29 NOTE — Progress Notes (Addendum)
Patient now complains of having diarrhea. MD on called page to request imodium. Update. Dr. Jennet MaduroPucilowska ordered imodium 4 MG.

## 2017-05-29 NOTE — [facility] Suicide Risk Assessment (Signed)
[facility] INPATIENT:  Family/Significant Other Suicide Prevention Education  Suicide Prevention Education:  Contact Attempts: mom Lajuana CarryGwenn [name] [phone] has been identified by the patient as the family member/significant other with whom the patient will be residing, and identified as the person(s) who will aid the patient in the event of a mental health crisis.  With written consent from the patient, two attempts were made to provide suicide prevention education, prior to and/or following the patient's discharge.  We were unsuccessful in providing suicide prevention education.  A suicide education pamphlet was given to the patient to share with family/significant other.  Date and time of first attempt:05/29/2017 at 9:40am. Date and time of second attempt:  Becky ShearsCassandra  Keimani [name] 05/29/2017, 9:50 AM

## 2017-05-29 NOTE — Plan of Care (Signed)
Patient is alert and oriented X 4. Patient denies SI, HI and AVH. Patient is compliant with medications. Patient complains of anxiety, ativan is used to help manage anxiety. Patient is very needy and attention seeking as evidenced by standing around or pacing  the nurses station after questions have been answered. Nurse will continue to monitor. Safety checks will continue Q 15 minutes. Education: Knowledge of [facility] Education information/materials will improve 05/29/2017 1022 - Progressing by Leamon ArntPowell, Darletta Noblett K, RN Emotional status will improve 05/29/2017 1022 - Progressing by Leamon ArntPowell, Kraven Calk K, RN Mental status will improve 05/29/2017 1022 - Progressing by Leamon ArntPowell, Harl Wiechmann K, RN Verbalization of understanding the information provided will improve 05/29/2017 1022 - Progressing by Leamon ArntPowell, Yexalen Deike K, RN   Self-Concept: Ability to disclose and discuss suicidal ideas will improve 05/29/2017 1022 - Progressing by Leamon ArntPowell, Mikaiya Tramble K, RN   Education: Knowledge of disease or condition will improve 05/29/2017 1022 - Progressing by Leamon ArntPowell, Lonette Stevison K, RN Understanding of discharge needs will improve 05/29/2017 1022 - Progressing by Leamon ArntPowell, Zareya Tuckett K, RN

## 2017-05-29 NOTE — [facility] Group Notes (Signed)
[facility] Group Notes:  (Nursing/MHT/Case Management/Adjunct)  Date:  05/29/2017  Time:  9:54 PM  Type of Therapy:  Group Therapy  Participation Level:  Did Not Attend  Participation Quality: Mayra NeerJackie L Kadar Chance 05/29/2017, 9:54 PM

## 2017-05-29 NOTE — Progress Notes (Signed)
Recreation Therapy Notes  Date: 01.14.2019  Time: 9:30 am  Location: Craft Room  Behavioral response: Appropriate  Intervention Topic: Self-Esteem  Discussion/Intervention: Group content today was focused on self-esteem. Patient defined self-esteem and where it comes form. The group described reasons self-esteem is important. Individuals stated things that impact self-esteem and positive ways to improve self-esteem. The group participated in the intervention "Collage of Me" where patients were able to create a collage of positive things that makes them who they are. Clinical Observations/Feedback:  Patient came to group late due to unknown reasons. She participated in the intervention and left group early, she stated she was done.  Marcille Barman LRT/CTRS         Darrion Macaulay 05/29/2017 11:29 AM

## 2017-05-29 NOTE — [facility] Counselor (Signed)
CSW spoke to the patient to come up with a new discharge plan since [facility]. Patient reports [place] and would like a referral to [facility].   Becky [name], MSW, [place] Estates[place]CSWA, LCASA 05/29/2017 2:03 PM

## 2017-05-29 NOTE — Tx Team (Signed)
Interdisciplinary Treatment and Diagnostic Plan Update  05/29/2017 Time of Session: 11am Becky [name] MRN: [id number]  Principal Diagnosis: <principal problem not specified>  Secondary Diagnoses: Active Problems:   Schizoaffective disorder, bipolar type (HCC)   Current Medications:  Current Facility-Administered Medications  Medication Dose Route Frequency Provider Last Rate Last Dose  . acetaminophen (TYLENOL) tablet 650 mg  650 mg Oral Q6H PRN Clapacs, Jackquline Denmark, MD   650 mg at 05/28/17 2025  . alum & mag hydroxide-simeth (MAALOX/MYLANTA) 200-200-20 MG/5ML suspension 30 mL  30 mL Oral Q4H PRN Clapacs, John T, MD   30 mL at 05/29/17 0231  . benztropine (COGENTIN) tablet 0.5 mg  0.5 mg Oral BID Clapacs, Jackquline Denmark, MD   0.5 mg at 05/29/17 0820  . FLUoxetine (PROZAC) capsule 30 mg  30 mg Oral Daily Clapacs, Jackquline Denmark, MD   30 mg at 05/29/17 4782  . folic acid (FOLVITE) tablet 1 mg  1 mg Oral Daily Clapacs, Jackquline Denmark, MD   1 mg at 05/29/17 9562  . gabapentin (NEURONTIN) capsule 600 mg  600 mg Oral TID Clapacs, John T, MD   600 mg at 05/29/17 0820  . haloperidol (HALDOL) tablet 5 mg  5 mg Oral BID Cindee Lame, MD   5 mg at 05/29/17 0820  . levothyroxine (SYNTHROID, LEVOTHROID) tablet 50 mcg  50 mcg Oral QAC breakfast Clapacs, Jackquline Denmark, MD   50 mcg at 05/29/17 0820  . LORazepam (ATIVAN) tablet 1 mg  1 mg Oral Q6H PRN Clapacs, Jackquline Denmark, MD   1 mg at 05/29/17 1308   Or  . LORazepam (ATIVAN) injection 1 mg  1 mg Intravenous Q6H PRN Clapacs, John T, MD      . magnesium hydroxide (MILK OF MAGNESIA) suspension 30 mL  30 mL Oral Daily PRN Clapacs, John T, MD      . mirtazapine (REMERON) tablet 7.5 mg  7.5 mg Oral QHS McNew, Ileene Hutchinson, MD      . multivitamin with minerals tablet 1 tablet  1 tablet Oral Daily Clapacs, Jackquline Denmark, MD   1 tablet at 05/29/17 0820  . nicotine (NICODERM CQ - dosed in mg/24 hours) patch 21 mg  21 mg Transdermal Daily Cindee Lame, MD   21 mg at 05/29/17 0819  . prazosin  (MINIPRESS) capsule 2 mg  2 mg Oral QHS Clapacs, Jackquline Denmark, MD   2 mg at 05/28/17 2112  . QUEtiapine (SEROQUEL) tablet 400 mg  400 mg Oral QHS Clapacs, Jackquline Denmark, MD   400 mg at 05/28/17 2112  . thiamine (VITAMIN B-1) tablet 100 mg  100 mg Oral Daily Clapacs, John T, MD   100 mg at 05/29/17 6578   Or  . thiamine (B-1) injection 100 mg  100 mg Intravenous Daily Clapacs, Jackquline Denmark, MD       PTA Medications: Medications Prior to Admission  Medication Sig Dispense Refill Last Dose  . benztropine (COGENTIN) 0.5 MG tablet Take 1 tablet (0.5 mg total) by mouth at bedtime. For prevention of drug induced tremors. 30 tablet 0 05/26/2017 at Unknown time  . Buprenorphine HCl-Naloxone HCl (SUBOXONE) 12-3 MG FILM Place 1 Film under the tongue every morning.   Past Week at Unknown time  . carbamazepine (TEGRETOL) 200 MG tablet take 1  Tablet by mouth 3 times daily FOR mood stabilization  0 Past Week at Unknown time  . FLUoxetine (PROZAC) 10 MG tablet Take 10 mg by mouth daily. Takes along with 20 mg  to equal 30mg  daily   Past Week at Unknown time  . FLUoxetine (PROZAC) 20 MG tablet Take 20 mg by mouth daily. Takes with 10mg  to equal 30mg  daily   Past Week at Unknown time  . gabapentin (NEURONTIN) 600 MG tablet Take 600 mg by mouth 3 (three) times daily.   0 Past Week at Unknown time  . levothyroxine (SYNTHROID, LEVOTHROID) 50 MCG tablet Take 1 tablet (50 mcg total) by mouth daily before breakfast. For low thyroid Hormone 30 tablet 0 05/08/2017 at Unknown time  . mirtazapine (REMERON) 45 MG tablet Take 1 tablet (45 mg total) by mouth at bedtime. For depression/sleep (Patient taking differently: Take 90 mg by mouth at bedtime. For depression/sleep) 30 tablet 0 Past Month at Unknown time  . prazosin (MINIPRESS) 2 MG capsule Take 1 capsule (2 mg total) by mouth at bedtime. For nightmares 30 capsule 0 Past Month at Unknown time  . QUEtiapine (SEROQUEL) 300 MG tablet Take 2 tablets (600 mg total) by mouth at bedtime. For mood  control (Patient taking differently: Take 600 mg by mouth at bedtime. 300mg  in morning, 300mg  in afternoon and 400mg  at night.  For mood control) 60 tablet 0 05/08/2017 at Unknown time  . risperiDONE (RISPERDAL) 3 MG tablet Take 3 mg by mouth 2 (two) times daily.   05/08/2017 at Unknown time  . sertraline (ZOLOFT) 50 MG tablet Take 1 Tablet by mouth once daily FOR depression  0 Past Week at Unknown time    Patient Stressors: Educational concerns Financial difficulties Health problems Legal issue Medication change or noncompliance Occupational concerns Substance abuse  Patient Strengths: Motivation for treatment/growth Supportive family/friends  Treatment Modalities: Medication Management, Group therapy, Case management,  1 to 1 session with clinician, Psychoeducation, Recreational therapy.   Physician Treatment Plan for Primary Diagnosis: <principal problem not specified> Long Term Goal(s): Improvement in symptoms so as ready for discharge   Short Term Goals: Ability to identify changes in lifestyle to reduce recurrence of condition will improve Ability to demonstrate self-control will improve Compliance with prescribed medications will improve Ability to identify triggers associated with substance abuse/mental health issues will improve  Medication Management: Evaluate patient's response, side effects, and tolerance of medication regimen.  Therapeutic Interventions: 1 to 1 sessions, Unit Group sessions and Medication administration.  Evaluation of Outcomes: Progressing  Physician Treatment Plan for Secondary Diagnosis: Active Problems:   Schizoaffective disorder, bipolar type (HCC)  Long Term Goal(s): Improvement in symptoms so as ready for discharge   Short Term Goals: Ability to identify changes in lifestyle to reduce recurrence of condition will improve Ability to demonstrate self-control will improve Compliance with prescribed medications will improve Ability to  identify triggers associated with substance abuse/mental health issues will improve     Medication Management: Evaluate patient's response, side effects, and tolerance of medication regimen.  Therapeutic Interventions: 1 to 1 sessions, Unit Group sessions and Medication administration.  Evaluation of Outcomes: Progressing   RN Treatment Plan for Primary Diagnosis: <principal problem not specified> Long Term Goal(s): Knowledge of disease and therapeutic regimen to maintain health will improve  Short Term Goals: Ability to demonstrate self-control, Ability to participate in decision making will improve, Ability to identify and develop effective coping behaviors will improve and Compliance with prescribed medications will improve  Medication Management: RN will administer medications as ordered by provider, will assess and evaluate patient's response and provide education to patient for prescribed medication. RN will report any adverse and/or side effects to prescribing provider.  Therapeutic Interventions: 1 on 1 counseling sessions, Psychoeducation, Medication administration, Evaluate responses to treatment, Monitor vital signs and CBGs as ordered, Perform/monitor CIWA, COWS, AIMS and Fall Risk screenings as ordered, Perform wound care treatments as ordered.  Evaluation of Outcomes: Progressing   LCSW Treatment Plan for Primary Diagnosis: <principal problem not specified> Long Term Goal(s): Safe transition to appropriate next level of care at discharge, Engage patient in therapeutic group addressing interpersonal concerns.  Short Term Goals: Engage patient in aftercare planning with referrals and resources, Increase social support, Identify triggers associated with mental health/substance abuse issues and Increase skills for wellness and recovery  Therapeutic Interventions: Assess for all discharge needs, 1 to 1 time with Social worker, Explore available resources and support systems, Assess  for adequacy in community support network, Educate family and significant other(s) on suicide prevention, Complete Psychosocial Assessment, Interpersonal group therapy.  Evaluation of Outcomes: Progressing   Progress in Treatment: Attending groups: Yes. Participating in groups: Yes. Taking medication as prescribed: Yes. Toleration medication: Yes. Family/Significant other contact made: No, will contact:  Mom Lajuana CarryGwenn Conway Patient understands diagnosis: Yes. Discussing patient identified problems/goals with staff: Yes. Medical problems stabilized or resolved: Yes. Denies suicidal/homicidal ideation: Yes. Issues/concerns per patient self-inventory: Yes. Other:  New problem(s) identified: No, Describe:  None  New Short Term/Long Term Goal(s): "To get off drugs, get situated on the right medications and into a treatment facility."  Discharge Plan or Barriers: To go to a shelter and attend IOP.   Reason for Continuation of Hospitalization: Hallucinations Medication stabilization Withdrawal symptoms  Estimated Length of Stay: 3-5 days  Recreational Therapy: Patient Stressors: Patient will identify 3 positive replacements for unhealthy/harmful habits x5 days.  Patient Goal:  Patient will be able to identify at least 5 coping skills to cope with stress by conclusion of recreation therapy tx.  Attendees: Patient: Becky [name] 05/29/2017 11:56 AM  Physician: Corinna GabHolly McNew, 05/29/2017 11:56 AM  Nursing: Cecille AmsterdamGigi, Manirattui RN 05/29/2017 11:56 AM  RN Care Manager: 05/29/2017 11:56 AM  Social Worker: Johny Shearsassandra Jarrett, LCSWA 05/29/2017 11:56 AM  Recreational Therapist: Danella DeisShay. Dreama SaaOutlaw CTRS, LRT 05/29/2017 11:56 AM  Other: Heidi DachKelsey Craig, LCSW 05/29/2017 11:56 AM  Other:  05/29/2017 11:56 AM  Other: 05/29/2017 11:56 AM    Scribe for Treatment Team: Johny Shearsassandra  Jarrett, LCSW 05/29/2017 11:56 AM

## 2017-05-29 NOTE — [facility] Group Notes (Signed)
05/29/2017 1PM  Type of Therapy and Topic:  Group Therapy:  Overcoming Obstacles  Participation Level:  Active    Description of Group:    In this group patients will be encouraged to explore what they see as obstacles to their own wellness and recovery. They will be guided to discuss their thoughts, feelings, and behaviors related to these obstacles. The group will process together ways to cope with barriers, with attention given to specific choices patients can make. Each patient will be challenged to identify changes they are motivated to make in order to overcome their obstacles. This group will be process-oriented, with patients participating in exploration of their own experiences as well as giving and receiving support and challenge from other group members.   Therapeutic Goals: 1. Patient will identify personal and current obstacles as they relate to admission. 2. Patient will identify barriers that currently interfere with their wellness or overcoming obstacles.  3. Patient will identify feelings, thought process and behaviors related to these barriers. 4. Patient will identify two changes they are willing to make to overcome these obstacles:      Summary of Patient Progress Actively and appropriately engaged in the group. Patient was able to provide support and validation to other group members.Patient practiced active listening when interacting with the facilitator and other group members Patient in still in the process of obtaining treatment goals. Becky HeckDanielle mentions that her obstacle for today is learning that she can not go into residential treatment because she has pending charges. She says that she plans to come up with a plan B for discharge.    Therapeutic Modalities:   Cognitive Behavioral Therapy Solution Focused Therapy Motivational Interviewing Relapse Prevention Therapy    Becky ShearsCassandra Zakarie [name] MSW, LCSWA 05/29/2017 1:48 PM

## 2017-05-29 NOTE — Progress Notes (Signed)
Recreation Therapy Notes  INPATIENT RECREATION THERAPY ASSESSMENT  Patient Details Name: Jacquenette ShoneDanielle Nicole Nuccio MRN: [id number] DOB: 09/21/1990 Today's Date: 05/29/2017  Patient Stressors: Family, Other (Comment)(Finances, Medication)  Coping Skills:   Exercise(Read, call sponser)  Personal Challenges: Concentration, Self-Esteem/Confidence, Stress Management, Substance Abuse, Time Management, Trusting Others  Leisure Interests (2+):  Sports - Swimming, Exercise - Walking  Awareness of Community Resources:  Yes  Community Resources:  Park  Current Use: Yes  If no, Barriers?:    Patient Strengths:  Forensic scientistLoyal, Out spoken  Patient Identified Areas of Improvement:  Stop doing drugs and get back on medication  Current Recreation Participation:  Swimming  Patient Goal for Hospitalization:  To get off drugs and on the right medication and get treatment  [place]  [place]   Current [place] (including self-harm):  No  Current HI:  Yes Patient states that she is hearing voices and they are saying bad things to her. She states they are not tell her to do anything.  Consent to Intern Participation: N/A   Keelin Neville 05/29/2017, 4:24 PM

## 2017-05-29 NOTE — [facility] Group Notes (Signed)
[facility] Group Notes:  (Nursing/MHT/Case Management/Adjunct)  Date:  05/29/2017  Time:  4:20 AM  Type of Therapy:  Group Therapy  Participation Level:  Active  Participation Quality:  Appropriate  Affect:  Appropriate  Cognitive:  Appropriate  Insight:  Appropriate  Engagement in Group:  Engaged  Modes of Intervention:  Discussion  Summary of Progress/Problems:  Sherrell D Lea 05/29/2017, 4:20 AM

## 2017-05-30 MED ORDER — HALOPERIDOL 5 MG PO TABS
5.0000 mg | ORAL_TABLET | Freq: Two times a day (BID) | ORAL | Status: DC
  Administered 2017-05-30 – 2017-05-31 (×2): 5 mg via ORAL
  Filled 2017-05-30 (×2): qty 1

## 2017-05-30 MED ORDER — QUETIAPINE FUMARATE 200 MG PO TABS
300.0000 mg | ORAL_TABLET | Freq: Every day | ORAL | Status: DC
  Administered 2017-05-30: 21:00:00 300 mg via ORAL
  Filled 2017-05-30: qty 1

## 2017-05-30 MED ORDER — HALOPERIDOL 2 MG PO TABS
2.0000 mg | ORAL_TABLET | Freq: Every day | ORAL | Status: DC
  Administered 2017-05-30: 2 mg via ORAL
  Filled 2017-05-30: qty 1

## 2017-05-30 NOTE — Plan of Care (Signed)
Redirected  on [place] information affect flat ,  denies suicidal ideations   Aware of disease  condition . Attending  unit Programing  Progressing Education: Knowledge of  General Education information/materials will improve 05/30/2017 1533 - Progressing by Crist InfanteFarrish, Colden Samaras A, RN   Not Progressing Education: Emotional status will improve 05/30/2017 1533 - Not Progressing by Crist InfanteFarrish, Fayola Meckes A, RN Mental status will improve 05/30/2017 1533 - Not Progressing by Crist InfanteFarrish, Kosei Rhodes A, RN Verbalization of understanding the information provided will improve 05/30/2017 1533 - Not Progressing by Crist InfanteFarrish, Gearald Stonebraker A, RN Self-Concept: Ability to disclose and discuss suicidal ideas will improve 05/30/2017 1533 - Not Progressing by Crist InfanteFarrish, Aldrin Engelhard A, RN Ability to verbalize positive feelings about self will improve 05/30/2017 1533 - Not Progressing by Crist InfanteFarrish, Shatoria Stooksbury A, RN Education: Knowledge of disease or condition will improve 05/30/2017 1533 - Not Progressing by Crist InfanteFarrish, Dacoda Finlay A, RN Understanding of discharge needs will improve 05/30/2017 1533 - Not Progressing by Crist InfanteFarrish, Emilio Baylock A, RN [facility] in Recreation Therapeutic Interventions STG-Other Recreation Therapy Goal (Specify) Description Patient will identify 3 positive replacements for unhealthy/harmful habits x5 days.   05/30/2017 1533 - Not Progressing by Crist InfanteFarrish, Nalla Purdy A, RN Activity: Sleeping patterns will improve 05/30/2017 1533 - Not Progressing by Crist InfanteFarrish, Arcadio Cope A, RN  programming.

## 2017-05-30 NOTE — Progress Notes (Signed)
[facility] MD Progress Note  05/30/2017 12:06 PM Becky [name]  MRN:  [id number]   Subjective:  Pt states that she is feeling slightly better today. She still hears voices but they are slightly improved since getting back on medications. She states that Haldol helps a lot with voices and anxiety. She feels more calm today. She is not having any opiate w/d symptoms. BP is slightly low but denies any symptoms. She states that she used to be on very high doses of Seroquel and Risperdal and that helped with voices. We discussed the risks of being on high doses of multiple anti-psychotics but she is adamant that she wants to continue both Haldol and Seroquel and they are the only medications that have helped her. She is sleeping better. She did not ask for SUboxone of Ativan today. She states that she has a pending charge with a court hearing in March that she plans to go to. She states that she thinks it will be mediated. She plans to pursue residential treatment when that is over with. In the meantime, she is going to stay with her boyfriend who does not use drugs and is a good support for her. She denies SI or any thoughts of self harm today. Affect is slightly brighter today.   Principal Problem: Schizoaffective disorder, bipolar type (HCC) Diagnosis:   Patient Active Problem List   Diagnosis Date Noted  . Schizoaffective disorder, bipolar type (HCC) [F25.0] 05/26/2017    Priority: High  . Alcohol abuse [F10.10] 05/26/2017  . Polysubstance abuse (HCC) [F19.10]   . Altered mental state [R41.82] 05/08/2017  . Hallucinations [R44.3]   . Bipolar disorder, curr episode mixed, severe, with psychotic features (HCC) [F31.64] 11/02/2016  . PTSD (post-traumatic stress disorder) [F43.10] 11/02/2016  . GAD (generalized anxiety disorder) [F41.1] 10/16/2016  . Opioid use disorder, moderate, dependence (HCC) [F11.20] 06/06/2016  . Sedative, hypnotic or anxiolytic use disorder, severe, dependence (HCC)  [F13.20] 06/06/2016  . Cocaine use disorder, severe, dependence (HCC) [F14.20] 06/06/2016  . Cannabis use disorder, severe, dependence (HCC) [F12.20] 06/06/2016  . MDD (major depressive disorder), recurrent severe, without psychosis (HCC) [F33.2] 06/03/2016  . Bipolar I disorder, most recent episode depressed (HCC) [F31.30] 07/04/2015  . Overdose [T50.901A] 07/02/2015  . Lactic acidosis [E87.2] 07/02/2015  . Acute respiratory failure (HCC) [J96.00] 07/02/2015  . Sepsis (HCC) [A41.9] 07/02/2015  . AKI (acute kidney injury) (HCC) [N17.9]   . Altered mental status [R41.82]   . Pyrexia [R50.9]   . Abdominal pain, epigastric [R10.13] 06/17/2015  . Hepatitis C antibody test positive [R76.8] 06/17/2015  . Peptic ulcer disease [K27.9] 06/17/2015  . Epigastric pain [R10.13]   . Transaminitis [R74.0] 06/16/2015  . History of substance abuse [Z87.898] 06/16/2015   Total Time spent with patient: 20 minutes  Past Psychiatric History: See H&P  Past Medical History:  Past Medical History:  Diagnosis Date  . Bipolar 1 disorder (HCC)   . Bronchitis   . GAD (generalized anxiety disorder)   . Hepatitis C antibody test positive 06/17/2015  . History of substance abuse 06/16/2015  . Hypothyroidism   . OCD (obsessive compulsive disorder)   . Peptic ulcer   . Polysubstance abuse (HCC)   . PTSD (post-traumatic stress disorder)     Past Surgical History:  Procedure Laterality Date  . OTHER SURGICAL HISTORY     scar tissue removed from right ovary   . OTHER SURGICAL HISTORY     fallopian tube repair  . WISDOM TOOTH EXTRACTION  Family History:  Family History  Problem Relation Age of Onset  . Drug abuse Brother   . Suicidality Brother   . Cancer Other   . Depression Mother   . Drug abuse Mother   . Alcoholism Father   . Bipolar disorder Cousin    Family Psychiatric  History: See H&P Social History:  Social History   Substance and Sexual Activity  Alcohol Use Yes   Comment: occ      Social History   Substance and Sexual Activity  Drug Use Yes  . Types: Marijuana, Methamphetamines, MDMA (Ecstacy), Oxycodone, Benzodiazepines, Cocaine    Social History   Socioeconomic History  . Marital status: Legally Separated    Spouse name: None  . Number of children: None  . Years of education: None  . Highest education level: None  Social Needs  . Financial resource strain: None  . Food insecurity - worry: None  . Food insecurity - inability: None  . Transportation needs - medical: None  . Transportation needs - non-medical: None  Occupational History  . None  Tobacco Use  . Smoking status: Current Every Day Smoker    Packs/day: 1.00    Years: 10.00    Pack years: 10.00    Types: Cigarettes  . Smokeless tobacco: Never Used  Substance and Sexual Activity  . Alcohol use: Yes    Comment: occ  . Drug use: Yes    Types: Marijuana, Methamphetamines, MDMA (Ecstacy), Oxycodone, Benzodiazepines, Cocaine  . Sexual activity: Yes    Birth control/protection: None  Other Topics Concern  . None  Social History Narrative  . None   Additional Social History:                         Sleep: Good  Appetite:  Good  Current Medications: Current Facility-Administered Medications  Medication Dose Route Frequency Provider Last Rate Last Dose  . acetaminophen (TYLENOL) tablet 650 mg  650 mg Oral Q6H PRN Clapacs, Jackquline Denmark, MD   650 mg at 05/30/17 1050  . alum & mag hydroxide-simeth (MAALOX/MYLANTA) 200-200-20 MG/5ML suspension 30 mL  30 mL Oral Q4H PRN Clapacs, Jackquline Denmark, MD   30 mL at 05/29/17 2130  . benztropine (COGENTIN) tablet 0.5 mg  0.5 mg Oral BID Clapacs, Jackquline Denmark, MD   0.5 mg at 05/30/17 0753  . FLUoxetine (PROZAC) capsule 30 mg  30 mg Oral Daily Clapacs, Jackquline Denmark, MD   30 mg at 05/30/17 0839  . folic acid (FOLVITE) tablet 1 mg  1 mg Oral Daily Clapacs, Jackquline Denmark, MD   1 mg at 05/30/17 0753  . gabapentin (NEURONTIN) capsule 600 mg  600 mg Oral TID Clapacs, John T, MD    600 mg at 05/30/17 1100  . haloperidol (HALDOL) tablet 2 mg  2 mg Oral Daily Lily Kernen R, MD      . haloperidol (HALDOL) tablet 5 mg  5 mg Oral BID Nadeem Romanoski R, MD      . levothyroxine (SYNTHROID, LEVOTHROID) tablet 50 mcg  50 mcg Oral QAC breakfast Clapacs, Jackquline Denmark, MD   50 mcg at 05/30/17 0753  . loperamide (IMODIUM) capsule 4 mg  4 mg Oral PRN Pucilowska, Jolanta B, MD   4 mg at 05/30/17 1049  . magnesium hydroxide (MILK OF MAGNESIA) suspension 30 mL  30 mL Oral Daily PRN Clapacs, John T, MD      . mirtazapine (REMERON) tablet 7.5 mg  7.5 mg Oral  QHS Taesean Reth, Ileene HutchinsonHolly R, MD   7.5 mg at 05/29/17 2128  . multivitamin with minerals tablet 1 tablet  1 tablet Oral Daily Clapacs, Jackquline DenmarkJohn T, MD   1 tablet at 05/30/17 0753  . nicotine (NICODERM CQ - dosed in mg/24 hours) patch 21 mg  21 mg Transdermal Daily Cindee LameIsbell, Lauren M, MD   21 mg at 05/30/17 0756  . prazosin (MINIPRESS) capsule 2 mg  2 mg Oral QHS Clapacs, Jackquline DenmarkJohn T, MD   2 mg at 05/29/17 2127  . QUEtiapine (SEROQUEL) tablet 400 mg  400 mg Oral QHS Clapacs, Jackquline DenmarkJohn T, MD   400 mg at 05/29/17 2127  . thiamine (VITAMIN B-1) tablet 100 mg  100 mg Oral Daily Clapacs, Jackquline DenmarkJohn T, MD   100 mg at 05/30/17 [id number]   Or  . thiamine (B-1) injection 100 mg  100 mg Intravenous Daily Clapacs, Jackquline DenmarkJohn T, MD        Lab Results:  Results for orders placed or performed during the hospital encounter of 05/26/17 (from the past 48 hour(s))  Urinalysis, Complete w Microscopic     Status: Abnormal   Collection Time: 05/29/17  1:38 PM  Result Value Ref Range   Color, Urine YELLOW (A) YELLOW   APPearance CLEAR (A) CLEAR   Specific Gravity, Urine 1.008 1.005 - 1.030   pH 8.0 5.0 - 8.0   Glucose, UA NEGATIVE NEGATIVE mg/dL   Hgb urine dipstick NEGATIVE NEGATIVE   Bilirubin Urine NEGATIVE NEGATIVE   Ketones, ur NEGATIVE NEGATIVE mg/dL   Protein, ur NEGATIVE NEGATIVE mg/dL   Nitrite NEGATIVE NEGATIVE   Leukocytes, UA NEGATIVE NEGATIVE   RBC / HPF 0-5 0 - 5 RBC/hpf   WBC, UA 6-30  0 - 5 WBC/hpf   Bacteria, UA RARE (A) NONE SEEN   Squamous Epithelial / LPF 0-5 (A) NONE SEEN    Comment: Performed at [facility], [address] [place], [place] [address]  Chlamydia/NGC rt PCR [facility] only)     Status: None   Collection Time: 05/29/17  1:38 PM  Result Value Ref Range   Specimen source GC/Chlam URINE, RANDOM    Chlamydia Tr NOT DETECTED NOT DETECTED   N gonorrhoeae NOT DETECTED NOT DETECTED    Comment: (NOTE) 100  This methodology has not been evaluated in pregnant women or in 200  patients with a history of hysterectomy. 300 400  This methodology will not be performed on patients less than 3514  years of age. Performed at [facility], [address] [place], [place] [address]     Blood Alcohol level:  Lab Results  Component Value Date   [facility] <[facility] 05/26/2017   ETH <10 05/08/2017    Metabolic Disorder Labs: Lab Results  Component Value Date   HGBA1C 5.3 05/27/2017   MPG 105.41 05/27/2017   MPG 105 10/16/2016   Lab Results  Component Value Date   PROLACTIN 8.8 10/16/2016   PROLACTIN 49.9 (H) 06/07/2016   Lab Results  Component Value Date   CHOL 182 05/27/2017   TRIG 254 (H) 05/27/2017   HDL 49 05/27/2017   CHOLHDL 3.7 05/27/2017   VLDL 51 (H) 05/27/2017   LDLCALC 82 05/27/2017   LDLCALC 75 10/16/2016    Physical Findings: AIMS: Facial and Oral Movements Muscles of Facial Expression: None, normal Lips and Perioral Area: None, normal Jaw: None, normal Tongue: None, normal,Extremity Movements Upper (arms, wrists, hands, fingers): None, normal Lower (legs, knees, ankles, toes): None, normal, Trunk Movements Neck, shoulders,  hips: None, normal, Overall Severity Severity of abnormal movements (highest score from questions above): None, normal Incapacitation due to abnormal movements: None, normal Patient's awareness of abnormal movements (rate only patient's report): No Awareness, Dental Status Current problems with teeth  and/or dentures?: No Does patient usually wear dentures?: No  CIWA:  CIWA-Ar Total: 0 COWS:     Musculoskeletal: Strength & Muscle Tone: within normal limits Gait & Station: normal Patient leans: N/A  Psychiatric Specialty Exam: Physical Exam  Nursing note and vitals reviewed. -no cogwheeling or muscle stiffness noted on exam  Review of Systems  All other systems reviewed and are negative.   Blood pressure (!) 98/49, pulse 89, temperature 97.8 F (36.6 C), temperature source Oral, resp. rate 18, height 5\' 3"  (1.6 m), weight 82.6 kg (182 lb), SpO2 100 %.Body mass index is 32.24 kg/m.  General Appearance: Casual  Eye Contact:  Good  Speech:  Clear and Coherent  Volume:  Normal  Mood:  Euthymic  Affect:  Constricted  Thought Process:  Coherent and Goal Directed  Orientation:  Full (Time, Place, and Person)  Thought Content:  Logical  Suicidal Thoughts:  No  Homicidal Thoughts:  No  Memory:  Immediate;   Fair  Judgement:  Fair  Insight:  Fair  Psychomotor Activity:  Normal  Concentration:  Concentration: Fair  Recall:  [employer] of Knowledge:  Fair  Language:  Fair  Akathisia:  No      Assets:  Communication Skills Desire for Improvement Resilience  ADL's:  Intact  Cognition:  WNL  Sleep:  Number of Hours: 7.3     Treatment Plan Summary: 27 yo female admitted due to worsening Ah and SI. AH are improving and SI has resolved. She has been on high doses of anti-psychotics in the past including high dose Seroquel and Risperdal. She does not appear overtly psychotic and although endorses AH she has no delay in responses, no evidence of responding to any internal stimuli, and she is very organized in thought process. She is adamant that Seroquel and Haldol work for her and does not want these medications stopped. She would like increase in dose of Haldol through the day as it is very effective for anxiety and voices.  Plan:  Schizoaffective disorder -Increase Haldol to  5 mg qam, 2 mg q2pm, and 5 mg qhs -Continue Cogentin -Decrease Seroquel to 300 mg qhs since we are increasing haldol. She does not likely need high doses of Seroquel (and possibly could be tapered completely off in the future-she does not want to stop at this time)and Triglycerides are elevated -Continue Remeron for depression -Continue Prazosin 2 mg qhs -Continue Prozac 30 mg daily  Hypothyroid -Continue Levothyroxine. TSH WNL  UA shows rare bacteria-likely not clean catch, otherwise normal, Chlamydia and Gonorrhea negative  Dispo -She will stay with her boyfriend on discharge and follow up with [facility] Haskell Riling, MD 05/30/2017, 12:06 PM

## 2017-05-30 NOTE — Progress Notes (Signed)
D: Patient stated slept good last night .Stated appetite is good and energy level  Is normal. Stated concentration is good . Stated on Depression scale 6 , hopeless 5 and anxiety 8 .( low 0-10 high) Denies suicidal  homicidal ideations  .  No auditory hallucinations  No pain concerns . Appropriate ADL'S. Interacting with peers and staff.  Patient complaints of  Withdrawal symptoms  Isolate to room  Limited interaction with peers  And staff  A: Encourage patient participation with unit programming . Instruction  Given on  Medication , verbalize understanding.  R: Voice no other concerns. Staff continue to monitor

## 2017-05-30 NOTE — Plan of Care (Signed)
Patient slept for Estimated Hours of 7.30; Precautionary checks every 15 minutes for safety maintained, room free of safety hazards, patient sustains no injury or falls during this shift.  

## 2017-05-30 NOTE — Progress Notes (Signed)
Recreation Therapy Notes  Date: 01.15.2019  Time: 9:30 am  Location: Craft Room  Behavioral response: N/A  Intervention Topic: Coping skills  Discussion/Intervention: Patient did not attend group. Clinical Observations/Feedback:  Patient did not attend group. Aarin Sparkman LRT/CTRS          Valory Wetherby 05/30/2017 10:48 AM

## 2017-05-30 NOTE — B[facility] Group Notes (Signed)
05/30/2017 1PM  Type of Therapy/Topic:  Group Therapy:  Feelings about Diagnosis  Participation Level:  Did Not Attend   Description of Group:   This group will allow patients to explore their thoughts and feelings about diagnoses they have received. Patients will be guided to explore their level of understanding and acceptance of these diagnoses. Facilitator will encourage patients to process their thoughts and feelings about the reactions of others to their diagnosis and will guide patients in identifying ways to discuss their diagnosis with significant others in their lives. This group will be process-oriented, with patients participating in exploration of their own experiences, giving and receiving support, and processing challenge from other group members.   Therapeutic Goals: 1. Patient will demonstrate understanding of diagnosis as evidenced by identifying two or more symptoms of the disorder 2. Patient will be able to express two feelings regarding the diagnosis 3. Patient will demonstrate their ability to communicate their needs through discussion and/or role play  Summary of Patient Progress: Pt was invited to attend group but chose not to attend. CSW will continue to encourage pt to attend group throughout their admission.   Therapeutic Modalities:   Cognitive Behavioral Therapy Brief Therapy  Heidi DachKelsey Lenford Beddow, MSW, LCSW 05/30/2017 1:34 PM

## 2017-05-30 NOTE — Progress Notes (Signed)
Patient ID: Becky [name], female   DOB: 03/02/1991, 27 y.o.   MRN: [id number] Isolative, slept mostly, in and out of the Wrap-Up Group, fixated on medication, especially the Ativan, was disappointed to find out it has been discontinued; denied SI/HI, endorsing non-commanding Audio Hallucination.

## 2017-05-31 MED ORDER — HALOPERIDOL 5 MG PO TABS
5.0000 mg | ORAL_TABLET | Freq: Three times a day (TID) | ORAL | Status: DC
  Administered 2017-05-31 – 2017-06-02 (×6): 5 mg via ORAL
  Filled 2017-05-31 (×5): qty 1

## 2017-05-31 MED ORDER — QUETIAPINE FUMARATE 200 MG PO TABS
200.0000 mg | ORAL_TABLET | Freq: Every day | ORAL | Status: DC
  Administered 2017-05-31 – 2017-06-01 (×2): 200 mg via ORAL
  Filled 2017-05-31 (×3): qty 1

## 2017-05-31 MED ORDER — HALOPERIDOL 5 MG PO TABS
5.0000 mg | ORAL_TABLET | ORAL | Status: DC
  Filled 2017-05-31: qty 1

## 2017-05-31 NOTE — BHH Group Notes (Signed)
  05/31/2017  Time: 0930  Type of Therapy/Topic:  Group Therapy:  Emotion Regulation  Participation Level:  Did Not Attend   Description of Group:    The purpose of this group is to assist patients in learning to regulate negative emotions and experience positive emotions. Patients will be guided to discuss ways in which they have been vulnerable to their negative emotions. These vulnerabilities will be juxtaposed with experiences of positive emotions or situations, and patients will be challenged to use positive emotions to combat negative ones. Special emphasis will be placed on coping with negative emotions in conflict situations, and patients will process healthy conflict resolution skills.  Therapeutic Goals: 1. Patient will identify two positive emotions or experiences to reflect on in order to balance out negative emotions 2. Patient will label two or more emotions that they find the most difficult to experience 3. Patient will demonstrate positive conflict resolution skills through discussion and/or role plays  Summary of Patient Progress: Pt was invited to attend group but chose not to attend. CSW will continue to encourage pt to attend group throughout their admission.   Therapeutic Modalities:   Cognitive Behavioral Therapy Feelings Identification Dialectical Behavioral Therapy  Heidi DachKelsey Quinteria Chisum, MSW, LCSW 05/31/2017 10:19 AM

## 2017-05-31 NOTE — Progress Notes (Signed)
Patient ID: Becky [name], female   DOB: 06/10/1990, 27 y.o.   MRN: [id number] A&Ox3, requested for Tylenol 650 mg PO PRN for 7/10 toothache @1950 , Maalox 30 ml for indigestion at 2040, attended the Wrap Up Group, manipulative, still seeking medications; denied SI/HI/AVH, medication compliant.

## 2017-05-31 NOTE — Plan of Care (Signed)
  Progressing Education: Knowledge of [facility] Education information/materials will improve 05/31/2017 1234 - Progressing by Crist InfanteFarrish, Jamaiyah Pyle A, RN Emotional status will improve 05/31/2017 1234 - Progressing by Crist InfanteFarrish, Nghia Mcentee A, RN Mental status will improve 05/31/2017 1234 - Progressing by Crist InfanteFarrish, Nilay Mangrum A, RN Verbalization of understanding the information provided will improve 05/31/2017 1234 - Progressing by Crist InfanteFarrish, Laetitia Schnepf A, RN Self-Concept: Ability to disclose and discuss suicidal ideas will improve 05/31/2017 1234 - Progressing by Crist InfanteFarrish, Kearsten Ginther A, RN Ability to verbalize positive feelings about self will improve 05/31/2017 1234 - Progressing by Crist InfanteFarrish, Wilmon Conover A, RN Education: Knowledge of disease or condition will improve 05/31/2017 1234 - Progressing by Crist InfanteFarrish, Dhani Dannemiller A, RN Activity: Sleeping patterns will improve 05/31/2017 1234 - Progressing by Crist InfanteFarrish, Jettson Crable A, RN

## 2017-05-31 NOTE — Progress Notes (Signed)
[facility] MD Progress Note  05/31/2017 12:18 PM Becky [name]  MRN:  [id number] Subjective:  Pt states that she is feeling better today. She states that the voices are much quieter. She has been very medication seeking and states that she wants to be on 5 mg TID of Haldol like she was on in the past. She states that this was the dose that was helpful for her. She states that she is glad to be back on medications. Affect is brighter today. She is sleeping well. She denies command hallucinations. Denies SI or thoughts of self harm. She is still planning to stay at her boyfriend's house and pursue outpatient substance treatment until her court hearing in March and then possibly look into residential treatment. She is organized and goal directed in thoughts. Does not appear to be responding to internal stimuli.   Principal Problem: Schizoaffective disorder, bipolar type (HCC) Diagnosis:   Patient Active Problem List   Diagnosis Date Noted  . Schizoaffective disorder, bipolar type (HCC) [F25.0] 05/26/2017    Priority: High  . Alcohol abuse [F10.10] 05/26/2017  . Polysubstance abuse (HCC) [F19.10]   . Altered mental state [R41.82] 05/08/2017  . Hallucinations [R44.3]   . Bipolar disorder, curr episode mixed, severe, with psychotic features (HCC) [F31.64] 11/02/2016  . PTSD (post-traumatic stress disorder) [F43.10] 11/02/2016  . GAD (generalized anxiety disorder) [F41.1] 10/16/2016  . Opioid use disorder, moderate, dependence (HCC) [F11.20] 06/06/2016  . Sedative, hypnotic or anxiolytic use disorder, severe, dependence (HCC) [F13.20] 06/06/2016  . Cocaine use disorder, severe, dependence (HCC) [F14.20] 06/06/2016  . Cannabis use disorder, severe, dependence (HCC) [F12.20] 06/06/2016  . MDD (major depressive disorder), recurrent severe, without psychosis (HCC) [F33.2] 06/03/2016  . Bipolar I disorder, most recent episode depressed (HCC) [F31.30] 07/04/2015  . Overdose [T50.901A] 07/02/2015  .  Lactic acidosis [E87.2] 07/02/2015  . Acute respiratory failure (HCC) [J96.00] 07/02/2015  . Sepsis (HCC) [A41.9] 07/02/2015  . AKI (acute kidney injury) (HCC) [N17.9]   . Altered mental status [R41.82]   . Pyrexia [R50.9]   . Abdominal pain, epigastric [R10.13] 06/17/2015  . Hepatitis C antibody test positive [R76.8] 06/17/2015  . Peptic ulcer disease [K27.9] 06/17/2015  . Epigastric pain [R10.13]   . Transaminitis [R74.0] 06/16/2015  . History of substance abuse [Z87.898] 06/16/2015   Total Time spent with patient: 20 minutes  Past Psychiatric History: See H&P  Past Medical History:  Past Medical History:  Diagnosis Date  . Bipolar 1 disorder (HCC)   . Bronchitis   . GAD (generalized anxiety disorder)   . Hepatitis C antibody test positive 06/17/2015  . History of substance abuse 06/16/2015  . Hypothyroidism   . OCD (obsessive compulsive disorder)   . Peptic ulcer   . Polysubstance abuse (HCC)   . PTSD (post-traumatic stress disorder)     Past Surgical History:  Procedure Laterality Date  . OTHER SURGICAL HISTORY     scar tissue removed from right ovary   . OTHER SURGICAL HISTORY     fallopian tube repair  . WISDOM TOOTH EXTRACTION     Family History:  Family History  Problem Relation Age of Onset  . Drug abuse Brother   . Suicidality Brother   . Cancer Other   . Depression Mother   . Drug abuse Mother   . Alcoholism Father   . Bipolar disorder Cousin    Family Psychiatric  History: See H&P Social History:  Social History   Substance and Sexual Activity  Alcohol Use Yes  Comment: occ     Social History   Substance and Sexual Activity  Drug Use Yes  . Types: Marijuana, Methamphetamines, MDMA (Ecstacy), Oxycodone, Benzodiazepines, Cocaine    Social History   Socioeconomic History  . Marital status: Legally Separated    Spouse name: None  . Number of children: None  . Years of education: None  . Highest education level: None  Social Needs  .  Financial resource strain: None  . Food insecurity - worry: None  . Food insecurity - inability: None  . Transportation needs - medical: None  . Transportation needs - non-medical: None  Occupational History  . None  Tobacco Use  . Smoking status: Current Every Day Smoker    Packs/day: 1.00    Years: 10.00    Pack years: 10.00    Types: Cigarettes  . Smokeless tobacco: Never Used  Substance and Sexual Activity  . Alcohol use: Yes    Comment: occ  . Drug use: Yes    Types: Marijuana, Methamphetamines, MDMA (Ecstacy), Oxycodone, Benzodiazepines, Cocaine  . Sexual activity: Yes    Birth control/protection: None  Other Topics Concern  . None  Social History Narrative  . None   Additional Social History:                         Sleep: Good  Appetite:  Good  Current Medications: Current Facility-Administered Medications  Medication Dose Route Frequency Provider Last Rate Last Dose  . acetaminophen (TYLENOL) tablet 650 mg  650 mg Oral Q6H PRN Clapacs, Jackquline Denmark, MD   650 mg at 05/31/17 1211  . alum & mag hydroxide-simeth (MAALOX/MYLANTA) 200-200-20 MG/5ML suspension 30 mL  30 mL Oral Q4H PRN Clapacs, Jackquline Denmark, MD   30 mL at 05/30/17 2040  . benztropine (COGENTIN) tablet 0.5 mg  0.5 mg Oral BID Clapacs, Jackquline Denmark, MD   0.5 mg at 05/31/17 0818  . FLUoxetine (PROZAC) capsule 30 mg  30 mg Oral Daily Clapacs, Jackquline Denmark, MD   30 mg at 05/31/17 0818  . folic acid (FOLVITE) tablet 1 mg  1 mg Oral Daily Clapacs, Jackquline Denmark, MD   1 mg at 05/31/17 0818  . gabapentin (NEURONTIN) capsule 600 mg  600 mg Oral TID Clapacs, John T, MD   600 mg at 05/31/17 1211  . haloperidol (HALDOL) tablet 2 mg  2 mg Oral Daily McNew, Ileene Hutchinson, MD   2 mg at 05/30/17 1346  . haloperidol (HALDOL) tablet 5 mg  5 mg Oral BID Haskell Riling, MD   5 mg at 05/31/17 0819  . levothyroxine (SYNTHROID, LEVOTHROID) tablet 50 mcg  50 mcg Oral QAC breakfast Clapacs, Jackquline Denmark, MD   50 mcg at 05/31/17 0818  . loperamide (IMODIUM)  capsule 4 mg  4 mg Oral PRN Pucilowska, Jolanta B, MD   2 mg at 05/30/17 1643  . magnesium hydroxide (MILK OF MAGNESIA) suspension 30 mL  30 mL Oral Daily PRN Clapacs, John T, MD      . mirtazapine (REMERON) tablet 7.5 mg  7.5 mg Oral QHS McNew, Ileene Hutchinson, MD   7.5 mg at 05/30/17 2123  . multivitamin with minerals tablet 1 tablet  1 tablet Oral Daily Clapacs, Jackquline Denmark, MD   1 tablet at 05/31/17 0818  . nicotine (NICODERM CQ - dosed in mg/24 hours) patch 21 mg  21 mg Transdermal Daily Cindee Lame, MD   21 mg at 05/31/17 1610  . prazosin (  MINIPRESS) capsule 2 mg  2 mg Oral QHS Clapacs, Jackquline Denmark, MD   2 mg at 05/30/17 2124  . QUEtiapine (SEROQUEL) tablet 300 mg  300 mg Oral QHS McNew, Ileene Hutchinson, MD   300 mg at 05/30/17 2123  . thiamine (VITAMIN B-1) tablet 100 mg  100 mg Oral Daily Clapacs, Jackquline Denmark, MD   100 mg at 05/31/17 1610   Or  . thiamine (B-1) injection 100 mg  100 mg Intravenous Daily Clapacs, Jackquline Denmark, MD        Lab Results:  Results for orders placed or performed during the hospital encounter of 05/26/17 (from the past 48 hour(s))  Urinalysis, Complete w Microscopic     Status: Abnormal   Collection Time: 05/29/17  1:38 PM  Result Value Ref Range   Color, Urine YELLOW (A) YELLOW   APPearance CLEAR (A) CLEAR   Specific Gravity, Urine 1.008 1.005 - 1.030   pH 8.0 5.0 - 8.0   Glucose, UA NEGATIVE NEGATIVE mg/dL   Hgb urine dipstick NEGATIVE NEGATIVE   Bilirubin Urine NEGATIVE NEGATIVE   Ketones, ur NEGATIVE NEGATIVE mg/dL   Protein, ur NEGATIVE NEGATIVE mg/dL   Nitrite NEGATIVE NEGATIVE   Leukocytes, UA NEGATIVE NEGATIVE   RBC / HPF 0-5 0 - 5 RBC/hpf   WBC, UA 6-30 0 - 5 WBC/hpf   Bacteria, UA RARE (A) NONE SEEN   Squamous Epithelial / LPF 0-5 (A) NONE SEEN    Comment: Performed at [facility], [address] [place], [place] [address]  Chlamydia/NGC rt PCR [facility] only)     Status: None   Collection Time: 05/29/17  1:38 PM  Result Value Ref Range   Specimen source  GC/Chlam URINE, RANDOM    Chlamydia Tr NOT DETECTED NOT DETECTED   N gonorrhoeae NOT DETECTED NOT DETECTED    Comment: (NOTE) 100  This methodology has not been evaluated in pregnant women or in 200  patients with a history of hysterectomy. 300 400  This methodology will not be performed on patients less than 74  years of age. Performed at [facility], [address] [place], [place] [address]     Blood Alcohol level:  Lab Results  Component Value Date   [facility] <10 05/26/2017   ETH <10 05/08/2017    Metabolic Disorder Labs: Lab Results  Component Value Date   HGBA1C 5.3 05/27/2017   MPG 105.41 05/27/2017   MPG 105 10/16/2016   Lab Results  Component Value Date   PROLACTIN 8.8 10/16/2016   PROLACTIN 49.9 (H) 06/07/2016   Lab Results  Component Value Date   CHOL 182 05/27/2017   TRIG 254 (H) 05/27/2017   HDL 49 05/27/2017   CHOLHDL 3.7 05/27/2017   VLDL 51 (H) 05/27/2017   LDLCALC 82 05/27/2017   LDLCALC 75 10/16/2016    Physical Findings: AIMS: Facial and Oral Movements Muscles of Facial Expression: None, normal Lips and Perioral Area: None, normal Jaw: None, normal Tongue: None, normal,Extremity Movements Upper (arms, wrists, hands, fingers): None, normal Lower (legs, knees, ankles, toes): None, normal, Trunk Movements Neck, shoulders, hips: None, normal, Overall Severity Severity of abnormal movements (highest score from questions above): None, normal Incapacitation due to abnormal movements: None, normal Patient's awareness of abnormal movements (rate only patient's report): No Awareness, Dental Status Current problems with teeth and/or dentures?: No Does patient usually wear dentures?: No  CIWA:  CIWA-Ar Total: 0 COWS:     Musculoskeletal: Strength & Muscle Tone: within normal limits  Gait & Station: normal Patient leans: N/A  Psychiatric Specialty Exam: Physical Exam  ROS  Blood pressure (!) 112/56, pulse 85, temperature 97.8 F (36.6  C), temperature source Oral, resp. rate 18, height 5\' 3"  (1.6 m), weight 82.6 kg (182 lb), SpO2 100 %.Body mass index is 32.24 kg/m.  General Appearance: Casual  Eye Contact:  Good  Speech:  Slow  Volume:  Normal  Mood:  Euthymic  Affect:  Constricted  Thought Process:  Coherent and Goal Directed, perseverative on medications  Orientation:  Full (Time, Place, and Person)  Thought Content:  Logical  Suicidal Thoughts:  No  Homicidal Thoughts:  No  Memory:  Immediate;   Fair  Judgement:  Fair  Insight:  Fair  Psychomotor Activity:  Normal  Concentration:  Concentration: Fair  Recall:  [employer]Fair  Fund of Knowledge:  Fair  Language:  Fair  Akathisia:  No      Assets:  Communication Skills Desire for Improvement Resilience Social Support  ADL's:  Intact  Cognition:  WNL  Sleep:  Number of Hours: 6.45     Treatment Plan Summary: 27 yo female admitted due to worsening AH and SI. Symptoms are improving and SI has resolved. She is very medication seeking and adamant that she wants Haldol 5 mg TID and this was the only medication dose that works for her. She is also on Seroquel which she is also adamant that she wants to continue despite discussions of polypharmacy. She is perseverative on the fact that she is nervous she wont be discharged on her same medications. She continues to endorse AH but has not been any evidence of psychosis during hospitalization.   Plan:  Schizoaffective disorder -Increase Haldol to 5 mg TID per her request. No muscle stiffness or cogwheeling on exam -Continue Cogentin -Decrease Seroquel to 200 mg qhs since we are increasing Haldol and to minimize polypharmacy -Continue Remeron for depression -Continue Prozac -Continue Prazosin  Hypothyroid -Continue Levothyroxine  Discussed results of negative Chlamydia and Gonorrhea  Dispo -She will stay with boyfriend on discharge  Haskell RilingHolly R McNew, MD 05/31/2017, 12:18 PM

## 2017-05-31 NOTE — Progress Notes (Signed)
D: Patient stated slept good last night .Stated appetite is good and energy level  Is normal. Stated concentration is good . Stated on Depression scale 6 , hopeless 4 and anxiety 8.( low 0-10 high) Denies suicidal  homicidal ideations  .  No auditory hallucinations  No pain concerns . Appropriate ADL'S. Interacting with peers and staff.  Patient  verbalize understanding  of  [place] Information . Noted  with her mood  less irritable  and   intrusive. Denies  suicidal ideation. No group / unit programing  Patient reports  Withdrawal  Symptoms  Tremors, diarrhea, craving, agitation, runny nose, chilling and cramping. Behavior and  Observation incongruent  With  Above symptoms .  A: Encourage patient participation with unit programming . Instruction  Given on  Medication , verbalize understanding.  R: Voice no other concerns. Staff continue to monitor

## 2017-05-31 NOTE — Plan of Care (Signed)
Patient slept for Estimated Hours of 6.45; Precautionary checks every 15 minutes for safety maintained, room free of safety hazards, patient sustains no injury or falls during this shift.  

## 2017-05-31 NOTE — [facility] Group Notes (Signed)
[facility] Group Notes:  (Nursing/MHT/Case Management/Adjunct)  Date:  05/31/2017  Time:  2:21 AM  Type of Therapy:  Group Therapy  Participation Level:  Active  Participation Quality:  Appropriate  Affect:  Appropriate  Cognitive:  Appropriate  Insight:  Appropriate  Engagement in Group:  Engaged  Modes of Intervention:  Discussion  Summary of Progress/Problems:  [name] 05/31/2017, 2:21 AM

## 2017-06-01 MED ORDER — LEVOTHYROXINE SODIUM 50 MCG PO TABS: 50.0000 ug | ORAL_TABLET | Freq: Every day | ORAL | 0 refills | Status: DC

## 2017-06-01 MED ORDER — MIRTAZAPINE 7.5 MG PO TABS: 7.5000 mg | ORAL_TABLET | Freq: Every day | ORAL | 0 refills | Status: DC

## 2017-06-01 MED ORDER — BENZTROPINE MESYLATE 0.5 MG PO TABS: 0.5000 mg | ORAL_TABLET | Freq: Every day | ORAL | 0 refills | Status: DC

## 2017-06-01 MED ORDER — QUETIAPINE FUMARATE 200 MG PO TABS: 200.0000 mg | ORAL_TABLET | Freq: Every day | ORAL | 0 refills | Status: DC

## 2017-06-01 MED ORDER — FLUOXETINE HCL 10 MG PO CAPS: 30.0000 mg | ORAL_CAPSULE | Freq: Every day | ORAL | 0 refills | Status: DC

## 2017-06-01 MED ORDER — GABAPENTIN 600 MG PO TABS: 600.0000 mg | ORAL_TABLET | Freq: Three times a day (TID) | ORAL | 0 refills | Status: DC

## 2017-06-01 MED ORDER — HALOPERIDOL 5 MG PO TABS: 5.0000 mg | ORAL_TABLET | Freq: Three times a day (TID) | ORAL | 0 refills | Status: DC

## 2017-06-01 MED ORDER — IBUPROFEN 600 MG PO TABS: 600.0000 mg | ORAL_TABLET | Freq: Four times a day (QID) | ORAL | Status: DC | PRN

## 2017-06-01 MED ORDER — PRAZOSIN HCL 2 MG PO CAPS: 2.0000 mg | ORAL_CAPSULE | Freq: Every day | ORAL | 0 refills | Status: DC

## 2017-06-01 NOTE — [facility] Group Notes (Signed)
LCSW Group Therapy Note 06/01/2017 9:00 AM  Type of Therapy and Topic:  Group Therapy:  Setting Goals  Participation Level:  Did Not Attend  Description of Group: In this process group, patients discussed using strengths to work toward goals and address challenges.  Patients identified two positive things about themselves and one goal they were working on.  Patients were given the opportunity to share openly and support each other's plan for self-empowerment.  The group discussed the value of gratitude and were encouraged to have a daily reflection of positive characteristics or circumstances.  Patients were encouraged to identify a plan to utilize their strengths to work on current challenges and goals.  Therapeutic Goals 1. Patient will verbalize personal strengths/positive qualities and relate how these can assist with achieving desired personal goals 2. Patients will verbalize affirmation of peers plans for personal change and goal setting 3. Patients will explore the value of gratitude and positive focus as related to successful achievement of goals 4. Patients will verbalize a plan for regular reinforcement of personal positive qualities and circumstances.  Summary of Patient Progress:       Therapeutic Modalities Cognitive Behavioral Therapy Motivational Interviewing    Alease FrameSonya S Taylor Levick, [place] 06/01/2017 2:41 PM

## 2017-06-01 NOTE — Plan of Care (Signed)
  Progressing Education: Knowledge of [facility] Education information/materials will improve 06/01/2017 1144 - Progressing by Crist InfanteFarrish, Qusai Kem A, RN Emotional status will improve 06/01/2017 1144 - Progressing by Crist InfanteFarrish, [place] Pechacek A, RN Mental status will improve 06/01/2017 1144 - Progressing by Crist InfanteFarrish, Jiayi Lengacher A, RN Verbalization of understanding the information provided will improve 06/01/2017 1144 - Progressing by Crist InfanteFarrish, Dom Haverland A, RN Self-Concept: Ability to disclose and discuss suicidal ideas will improve 06/01/2017 1144 - Progressing by Crist InfanteFarrish, Kaylena Pacifico A, RN Ability to verbalize positive feelings about self will improve 06/01/2017 1144 - Progressing by Crist InfanteFarrish, Nafeesah Lapaglia A, RN Education: Knowledge of disease or condition will improve 06/01/2017 1144 - Progressing by Crist InfanteFarrish, Arad Burston A, RN Activity: Sleeping patterns will improve 06/01/2017 1144 - Progressing by Crist InfanteFarrish, Addis Tuohy A, RN

## 2017-06-01 NOTE — Plan of Care (Signed)
Patient slept for Estimated Hours of 6.15; Precautionary checks every 15 minutes for safety maintained, room free of safety hazards, patient sustains no injury or falls during this shift.  

## 2017-06-01 NOTE — Progress Notes (Signed)
D: Patient stated slept good last night .Stated appetite is good and energy level  Is normal. Stated concentration is good . Stated on Depression scale 6 , hopeless 4 and anxiety 0 .( low 0-10 high) Denies suicidal  homicidal ideations  .  No auditory hallucinations  No pain concerns . Appropriate ADL'S. Interacting with peers and staff.  Continue to complain  Of withdrawal symptoms Staff [facility] , staff having to redirect patient on information . Emotionally  patient calm  able to take directions . Working on Pharmacologistcoping skills . Patient   able to inform staff of her needs . No unit  programing this shift .  Denies suicidal ideations . Aware of disease condition.  Mood  and behavior calm , patient not  attending unit programing     A: Encourage patient participation with unit programming . Instruction  Given on  Medication , verbalize understanding.  R: Voice no other concerns. Staff continue to monitor

## 2017-06-01 NOTE — [facility] Suicide Risk Assessment (Signed)
[facility] INPATIENT:  Family/Significant Other Suicide Prevention Education  Suicide Prevention Education:  Contact Attempts: Mother Lajuana CarryGwenn Conway [phone] has been identified by the patient as the family member/significant other with whom the patient will be residing, and identified as the person(s) who will aid the patient in the event of a mental health crisis.  With written consent from the patient, two attempts were made to provide suicide prevention education, prior to and/or following the patient's discharge.  We were unsuccessful in providing suicide prevention education.  A suicide education pamphlet was given to the patient to share with family/significant other.  Date and time of first attempt:05/29/2017 at 9:40am Date and time of second attempt:06/01/2017 at 1:45pm  Becky ShearsCassandra  Becky [name] 06/01/2017, 1:44 PM

## 2017-06-01 NOTE — Progress Notes (Signed)
Patient ID: Becky [name], female   DOB: 06/01/1990, 27 y.o.   MRN: [id number] Visible in the milieu, pleasant on approach, c/o indigestion and received Maalox 30ml; denied SI/HI.

## 2017-06-01 NOTE — [facility] Group Notes (Signed)
06/01/2017  Time: 1:00PM  Type of Therapy/Topic:  Group Therapy:  Balance in Life  Participation Level:  Did Not Attend  Description of Group:   This group will address the concept of balance and how it feels and looks when one is unbalanced. Patients will be encouraged to process areas in their lives that are out of balance and identify reasons for remaining unbalanced. Facilitators will guide patients in utilizing problem-solving interventions to address and correct the stressor making their life unbalanced. Understanding and applying boundaries will be explored and addressed for obtaining and maintaining a balanced life. Patients will be encouraged to explore ways to assertively make their unbalanced needs known to significant others in their lives, using other group members and facilitator for support and feedback.  Therapeutic Goals: 1. Patient will identify two or more emotions or situations they have that consume much of in their lives. 2. Patient will identify signs/triggers that life has become out of balance:  3. Patient will identify two ways to set boundaries in order to achieve balance in their lives:  4. Patient will demonstrate ability to communicate their needs through discussion and/or role plays  Summary of Patient Progress: Pt was invited to attend group but chose not to attend. CSW will continue to encourage pt to attend group throughout their admission.    Therapeutic Modalities:   Cognitive Behavioral Therapy Solution-Focused Therapy Assertiveness Training  Heidi DachKelsey Berit Raczkowski, MSW, LCSW 06/01/2017 1:36 PM

## 2017-06-01 NOTE — Progress Notes (Signed)
Recreation Therapy Notes  Date: 01.17.2019  Time: 9:30 am   Location: Craft Room  Behavioral response: N/A  Intervention Topic: Anger  Discussion/Intervention: Patient did not attend group. Clinical Observations/Feedback:  Patient did not attend group.  Corissa Oguinn LRT/CTRS         Becky [name] 06/01/2017 10:41 AM 

## 2017-06-01 NOTE — Progress Notes (Signed)
[facility] MD Progress Note  06/01/2017 1:02 PM Becky [name]  MRN:  [id number] Subjective: Pt states that she is feeling well today. She states that the increase in Haldol has really helped with voices. She states that she is sleeping much better and no longer having nightmares. SI has also resolved. She hears some voices but very minimal and are much better. Denies command hallucinations. She states that she still plans to pursue residential substance treatment after her court hearing in March. IN the meantime, she will got to [facility]. Affect is brighter today.   Principal Problem: Schizoaffective disorder, bipolar type (HCC) Diagnosis:   Patient Active Problem List   Diagnosis Date Noted  . Schizoaffective disorder, bipolar type (HCC) [F25.0] 05/26/2017    Priority: High  . Alcohol abuse [F10.10] 05/26/2017  . Polysubstance abuse (HCC) [F19.10]   . Altered mental state [R41.82] 05/08/2017  . Hallucinations [R44.3]   . Bipolar disorder, curr episode mixed, severe, with psychotic features (HCC) [F31.64] 11/02/2016  . PTSD (post-traumatic stress disorder) [F43.10] 11/02/2016  . GAD (generalized anxiety disorder) [F41.1] 10/16/2016  . Opioid use disorder, moderate, dependence (HCC) [F11.20] 06/06/2016  . Sedative, hypnotic or anxiolytic use disorder, severe, dependence (HCC) [F13.20] 06/06/2016  . Cocaine use disorder, severe, dependence (HCC) [F14.20] 06/06/2016  . Cannabis use disorder, severe, dependence (HCC) [F12.20] 06/06/2016  . MDD (major depressive disorder), recurrent severe, without psychosis (HCC) [F33.2] 06/03/2016  . Bipolar I disorder, most recent episode depressed (HCC) [F31.30] 07/04/2015  . Overdose [T50.901A] 07/02/2015  . Lactic acidosis [E87.2] 07/02/2015  . Acute respiratory failure (HCC) [J96.00] 07/02/2015  . Sepsis (HCC) [A41.9] 07/02/2015  . AKI (acute kidney injury) (HCC) [N17.9]   . Altered mental status [R41.82]   . Pyrexia [R50.9]   . Abdominal pain,  epigastric [R10.13] 06/17/2015  . Hepatitis C antibody test positive [R76.8] 06/17/2015  . Peptic ulcer disease [K27.9] 06/17/2015  . Epigastric pain [R10.13]   . Transaminitis [R74.0] 06/16/2015  . History of substance abuse [Z87.898] 06/16/2015   Total Time spent with patient: 20 minutes  Past Psychiatric History: See H&P  Past Medical History:  Past Medical History:  Diagnosis Date  . Bipolar 1 disorder (HCC)   . Bronchitis   . GAD (generalized anxiety disorder)   . Hepatitis C antibody test positive 06/17/2015  . History of substance abuse 06/16/2015  . Hypothyroidism   . OCD (obsessive compulsive disorder)   . Peptic ulcer   . Polysubstance abuse (HCC)   . PTSD (post-traumatic stress disorder)     Past Surgical History:  Procedure Laterality Date  . OTHER SURGICAL HISTORY     scar tissue removed from right ovary   . OTHER SURGICAL HISTORY     fallopian tube repair  . WISDOM TOOTH EXTRACTION     Family History:  Family History  Problem Relation Age of Onset  . Drug abuse Brother   . Suicidality Brother   . Cancer Other   . Depression Mother   . Drug abuse Mother   . Alcoholism Father   . Bipolar disorder Cousin    Family Psychiatric  History: See H&P Social History:  Social History   Substance and Sexual Activity  Alcohol Use Yes   Comment: occ     Social History   Substance and Sexual Activity  Drug Use Yes  . Types: Marijuana, Methamphetamines, MDMA (Ecstacy), Oxycodone, Benzodiazepines, Cocaine    Social History   Socioeconomic History  . Marital status: Legally Separated    Spouse name:  None  . Number of children: None  . Years of education: None  . Highest education level: None  Social Needs  . Financial resource strain: None  . Food insecurity - worry: None  . Food insecurity - inability: None  . Transportation needs - medical: None  . Transportation needs - non-medical: None  Occupational History  . None  Tobacco Use  . Smoking  status: Current Every Day Smoker    Packs/day: 1.00    Years: 10.00    Pack years: 10.00    Types: Cigarettes  . Smokeless tobacco: Never Used  Substance and Sexual Activity  . Alcohol use: Yes    Comment: occ  . Drug use: Yes    Types: Marijuana, Methamphetamines, MDMA (Ecstacy), Oxycodone, Benzodiazepines, Cocaine  . Sexual activity: Yes    Birth control/protection: None  Other Topics Concern  . None  Social History Narrative  . None   Additional Social History:                         Sleep: Good  Appetite:  Good  Current Medications: Current Facility-Administered Medications  Medication Dose Route Frequency Provider Last Rate Last Dose  . acetaminophen (TYLENOL) tablet 650 mg  650 mg Oral Q6H PRN Clapacs, Jackquline DenmarkJohn T, MD   650 mg at 06/01/17 1051  . alum & mag hydroxide-simeth (MAALOX/MYLANTA) 200-200-20 MG/5ML suspension 30 mL  30 mL Oral Q4H PRN Clapacs, John T, MD   30 mL at 05/31/17 1546  . benztropine (COGENTIN) tablet 0.5 mg  0.5 mg Oral BID Clapacs, Jackquline DenmarkJohn T, MD   0.5 mg at 06/01/17 0814  . FLUoxetine (PROZAC) capsule 30 mg  30 mg Oral Daily Clapacs, Jackquline DenmarkJohn T, MD   30 mg at 06/01/17 0813  . folic acid (FOLVITE) tablet 1 mg  1 mg Oral Daily Clapacs, Jackquline DenmarkJohn T, MD   1 mg at 06/01/17 0814  . gabapentin (NEURONTIN) capsule 600 mg  600 mg Oral TID Clapacs, John T, MD   600 mg at 06/01/17 1203  . haloperidol (HALDOL) tablet 5 mg  5 mg Oral TID Haskell RilingMcNew, Neleh Muldoon R, MD   5 mg at 06/01/17 0814  . levothyroxine (SYNTHROID, LEVOTHROID) tablet 50 mcg  50 mcg Oral QAC breakfast Clapacs, Jackquline DenmarkJohn T, MD   50 mcg at 06/01/17 [phone] [id number]  . loperamide (IMODIUM) capsule 4 mg  4 mg Oral PRN Pucilowska, Jolanta B, MD   2 mg at 06/01/17 1050  . magnesium hydroxide (MILK OF MAGNESIA) suspension 30 mL  30 mL Oral Daily PRN Clapacs, John T, MD      . mirtazapine (REMERON) tablet 7.5 mg  7.5 mg Oral QHS Marvon Shillingburg, Ileene HutchinsonHolly R, MD   7.5 mg at 05/31/17 2107  . multivitamin with minerals tablet 1 tablet  1 tablet Oral  Daily Clapacs, Jackquline DenmarkJohn T, MD   1 tablet at 06/01/17 0813  . nicotine (NICODERM CQ - dosed in mg/24 hours) patch 21 mg  21 mg Transdermal Daily Cindee LameIsbell, Lauren M, MD   21 mg at 06/01/17 0819  . prazosin (MINIPRESS) capsule 2 mg  2 mg Oral QHS Clapacs, Jackquline DenmarkJohn T, MD   2 mg at 05/31/17 2107  . QUEtiapine (SEROQUEL) tablet 200 mg  200 mg Oral QHS Gianny Sabino, Ileene HutchinsonHolly R, MD   200 mg at 05/31/17 2106  . thiamine (VITAMIN B-1) tablet 100 mg  100 mg Oral Daily Clapacs, Jackquline DenmarkJohn T, MD   100 mg at 06/01/17 [id number]   Or  .  thiamine (B-1) injection 100 mg  100 mg Intravenous Daily Clapacs, John T, MD        Lab Results: No results found for this or any previous visit (from the past 48 hour(s)).  Blood Alcohol level:  Lab Results  Component Value Date   ETH <10 05/26/2017   ETH <10 05/08/2017    Metabolic Disorder Labs: Lab Results  Component Value Date   HGBA1C 5.3 05/27/2017   MPG 105.41 05/27/2017   MPG 105 10/16/2016   Lab Results  Component Value Date   PROLACTIN 8.8 10/16/2016   PROLACTIN 49.9 (H) 06/07/2016   Lab Results  Component Value Date   CHOL 182 05/27/2017   TRIG 254 (H) 05/27/2017   HDL 49 05/27/2017   CHOLHDL 3.7 05/27/2017   VLDL 51 (H) 05/27/2017   LDLCALC 82 05/27/2017   LDLCALC 75 10/16/2016    Physical Findings: AIMS: Facial and Oral Movements Muscles of Facial Expression: None, normal Lips and Perioral Area: None, normal Jaw: None, normal Tongue: None, normal,Extremity Movements Upper (arms, wrists, hands, fingers): None, normal Lower (legs, knees, ankles, toes): None, normal, Trunk Movements Neck, shoulders, hips: None, normal, Overall Severity Severity of abnormal movements (highest score from questions above): None, normal Incapacitation due to abnormal movements: None, normal Patient's awareness of abnormal movements (rate only patient's report): No Awareness, Dental Status Current problems with teeth and/or dentures?: No Does patient usually wear dentures?: No  CIWA:   CIWA-Ar Total: 0 COWS:     Musculoskeletal: Strength & Muscle Tone: within normal limits Gait & Station: normal Patient leans: N/A  Psychiatric Specialty Exam: Physical Exam  ROS  Blood pressure 128/71, pulse 82, temperature 98 F (36.7 C), temperature source Oral, resp. rate 18, height 5\' 3"  (1.6 m), weight 82.6 kg (182 lb), SpO2 99 %.Body mass index is 32.24 kg/m.  General Appearance: Casual  Eye Contact:  Good  Speech:  Clear and Coherent  Volume:  Normal  Mood:  Euthymic  Affect:  Congruent  Thought Process:  Coherent and Goal Directed  Orientation:  Full (Time, Place, and Person)  Thought Content:  Logical  Suicidal Thoughts:  No  Homicidal Thoughts:  No  Memory:  Immediate;   Fair  Judgement:  Fair  Insight:  Fair  Psychomotor Activity:  Normal  Concentration:  Concentration: Fair  Recall:  [employer] of Knowledge:  Fair  Language:  Fair  Akathisia:  No      Assets:  Communication Skills Resilience  ADL's:  Intact  Cognition:  WNL  Sleep:  Number of Hours: 6.15     Treatment Plan Summary: 27 yo female admitted due to AH and SI. Symptoms are resolving and she is feeling much better.   Plan:  Schizoaffective disorder -Continue Haldol 5 mg TID. No muscle stiffness of cogwheeling on exam -Continue Cogentin -Continue Seroquel 200 mg qhs -Continue Remeron, Prozac and Prazosin  Hypothyroid -Continue Levothyroxine  Dispo -She will stay with her boyfriend on discharge and follow up with Colan Neptune, MD 06/01/2017, 1:02 PM

## 2017-06-02 NOTE — Plan of Care (Signed)
Patient slept for Estimated Hours of 6.30; Precautionary checks every 15 minutes for safety maintained, room free of safety hazards, patient sustains no injury or falls during this shift.  

## 2017-06-02 NOTE — Progress Notes (Signed)
Received Becky [name] this AM after breakfast, she was compliant with her medications. She verbalized feeling safe for discharge home today and her boyfriend will be picking her up at 1030 hrs. She is packing her clothes from her room. She received her discharge order, the AVS was reviewed and her questins answered. The 7 day supply of medications were given to her and her prescriptions. Her personal bellongings were returned. She was discharged with her boyfriend without incident.

## 2017-06-02 NOTE — Discharge Summary (Signed)
Physician Discharge Summary Note  Patient:  Becky [name] is an 27 y.o., female MRN:  [id number] DOB:  09-Sep-1990 Patient phone:  [phone] (home)  Patient address:   [address] [place] [address],  Total Time spent with patient: 20 minutes  Plus 20 minutes of medication reconciliation, discharge planning, and discharge documentation   Date of Admission:  05/26/2017 Date of Discharge: 06/02/17  Reason for Admission:  AH and SI, opiate abuse  Principal Problem: Schizoaffective disorder, bipolar type [facility]) Discharge Diagnoses: Patient Active Problem List   Diagnosis Date Noted  . Schizoaffective disorder, bipolar type (HCC) [F25.0] 05/26/2017    Priority: High  . Alcohol abuse [F10.10] 05/26/2017  . Polysubstance abuse (HCC) [F19.10]   . Altered mental state [R41.82] 05/08/2017  . Hallucinations [R44.3]   . Bipolar disorder, curr episode mixed, severe, with psychotic features (HCC) [F31.64] 11/02/2016  . PTSD (post-traumatic stress disorder) [F43.10] 11/02/2016  . GAD (generalized anxiety disorder) [F41.1] 10/16/2016  . Opioid use disorder, moderate, dependence (HCC) [F11.20] 06/06/2016  . Sedative, hypnotic or anxiolytic use disorder, severe, dependence (HCC) [F13.20] 06/06/2016  . Cocaine use disorder, severe, dependence (HCC) [F14.20] 06/06/2016  . Cannabis use disorder, severe, dependence (HCC) [F12.20] 06/06/2016  . MDD (major depressive disorder), recurrent severe, without psychosis (HCC) [F33.2] 06/03/2016  . Bipolar I disorder, most recent episode depressed (HCC) [F31.30] 07/04/2015  . Overdose [T50.901A] 07/02/2015  . Lactic acidosis [E87.2] 07/02/2015  . Acute respiratory failure (HCC) [J96.00] 07/02/2015  . Sepsis (HCC) [A41.9] 07/02/2015  . AKI (acute kidney injury) (HCC) [N17.9]   . Altered mental status [R41.82]   . Pyrexia [R50.9]   . Abdominal pain, epigastric [R10.13] 06/17/2015  . Hepatitis C antibody test positive [R76.8] 06/17/2015  .  Peptic ulcer disease [K27.9] 06/17/2015  . Epigastric pain [R10.13]   . Transaminitis [R74.0] 06/16/2015  . History of substance abuse [Z87.898] 06/16/2015    Past Psychiatric History: See H&P  Past Medical History:  Past Medical History:  Diagnosis Date  . Bipolar 1 disorder (HCC)   . Bronchitis   . GAD (generalized anxiety disorder)   . Hepatitis C antibody test positive 06/17/2015  . History of substance abuse 06/16/2015  . Hypothyroidism   . OCD (obsessive compulsive disorder)   . Peptic ulcer   . Polysubstance abuse (HCC)   . PTSD (post-traumatic stress disorder)     Past Surgical History:  Procedure Laterality Date  . OTHER SURGICAL HISTORY     scar tissue removed from right ovary   . OTHER SURGICAL HISTORY     fallopian tube repair  . WISDOM TOOTH EXTRACTION     Family History:  Family History  Problem Relation Age of Onset  . Drug abuse Brother   . Suicidality Brother   . Cancer Other   . Depression Mother   . Drug abuse Mother   . Alcoholism Father   . Bipolar disorder Cousin    Family Psychiatric  History: See H&P Social History:  Social History   Substance and Sexual Activity  Alcohol Use Yes   Comment: occ     Social History   Substance and Sexual Activity  Drug Use Yes  . Types: Marijuana, Methamphetamines, MDMA (Ecstacy), Oxycodone, Benzodiazepines, Cocaine    Social History   Socioeconomic History  . Marital status: Legally Separated    Spouse name: None  . Number of children: None  . Years of education: None  . Highest education level: None  Social Needs  . Financial  resource strain: None  . Food insecurity - worry: None  . Food insecurity - inability: None  . Transportation needs - medical: None  . Transportation needs - non-medical: None  Occupational History  . None  Tobacco Use  . Smoking status: Current Every Day Smoker    Packs/day: 1.00    Years: 10.00    Pack years: 10.00    Types: Cigarettes  . Smokeless tobacco: Never  Used  Substance and Sexual Activity  . Alcohol use: Yes    Comment: occ  . Drug use: Yes    Types: Marijuana, Methamphetamines, MDMA (Ecstacy), Oxycodone, Benzodiazepines, Cocaine  . Sexual activity: Yes    Birth control/protection: None  Other Topics Concern  . None  Social History Narrative  . None    Hospital Course:  Pt was restarted on her home medications that she had been off for several weeks. She was on Suboxone taper for 3 days to assist in detox from opiates that she was abusing. She was very medication seeking during hospitalization and adamant about what medications she wanted despite polypharmacy. She felt that Haldol 5 mg TID was the only dose that was helpful for her. She is also on Seroquel and this was tapered down to avoid polypharmacy with anti-psychotics. She was adamant that she wanted to also continue Seroquel because the combination with Haldol was the only medications to help with distressing hallucinations. On day of discharge, she was feeling much better. She denied AH and felt they were much better. She also has been consistently been denying SI through hospitalizations. Affect was brighter. She was sleeping well and eating well. She was wanting a residential substance treatment program but has a pending court hearing in March which was preventing her from getting into a program. She was going to attend outpatient treatment and after the court hearing try to pursue residential program. She was very future oriented. She plans to stay with her boyfriend on discharge. She denied SI, HI, AH, VH. She was calm and pleasant during the hospital stay. She was given 7 day supply of medications from our pharmacy. She was very organized and goal directed through hospital stay. She did not appear psychotic or manic and was not observed responding to internal stimuli at all through hospitalization.   The patient is at low risk of imminent suicide. Patient denied thoughts, intent, or  plan for harm to self or others, expressed significant future orientation, and expressed an ability to mobilize assistance for her needs. She is presently void of any contributing psychiatric symptoms, cognitive difficulties, or substance use which would elevate her risk for lethality. Chronic risk for lethality is elevated in light of substance abuse The chronic risk is presently mitigated by her ongoing desire and engagement in [facility] treatment and mobilization of support from family and friends. Chronic risk may elevate if she experiences any significant loss or worsening of symptoms, which can be managed and monitored through outpatient providers. At this time,a cute risk for lethality is low and she is stable for ongoing outpatient management.   Modifiable risk factors were addressed during this hospitalization through appropriate pharmacotherapy and establishment of outpatient follow-up treatment. Some risk factors for suicide are situational (i.e. Unstable housing) or related personality pathology (i.e. Poor coping mechanisms) and thus cannot be further mitigated by continued hospitalization in this setting.    Physical Findings: AIMS: Facial and Oral Movements Muscles of Facial Expression: None, normal Lips and Perioral Area: None, normal Jaw: None, normal Tongue: None,  normal,Extremity Movements Upper (arms, wrists, hands, fingers): None, normal Lower (legs, knees, ankles, toes): None, normal, Trunk Movements Neck, shoulders, hips: None, normal, Overall Severity Severity of abnormal movements (highest score from questions above): None, normal Incapacitation due to abnormal movements: None, normal Patient's awareness of abnormal movements (rate only patient's report): No Awareness, Dental Status Current problems with teeth and/or dentures?: No Does patient usually wear dentures?: No  CIWA:  CIWA-Ar Total: 0 COWS:     Musculoskeletal: Strength & Muscle Tone: within normal limits Gait &  Station: normal Patient leans: N/A  Psychiatric Specialty Exam: Physical Exam  ROS  Blood pressure (!) 112/58, pulse 66, temperature 98 F (36.7 C), temperature source Oral, resp. rate 18, height 5\' 3"  (1.6 m), weight 82.6 kg (182 lb), SpO2 100 %.Body mass index is 32.24 kg/m.  General Appearance: Casual  Eye Contact:  Good  Speech:  Clear and Coherent  Volume:  Normal  Mood:  Euthymic  Affect:  Appropriate  Thought Process:  Coherent and Goal Directed  Orientation:  Full (Time, Place, and Person)  Thought Content:  Logical  Suicidal Thoughts:  No  Homicidal Thoughts:  No  Memory:  Immediate;   Fair  Judgement:  Fair  Insight:  Fair  Psychomotor Activity:  Normal  Concentration:  Concentration: Fair  Recall:  Fair  Fund of Knowledge:  Fair  Language:  Fair  Akathisia:  No      Assets:  Communication Skills Desire for Improvement Resilience Social Support  ADL's:  Intact  Cognition:  WNL  Sleep:  Number of Hours: 6.3     Have you used any form of tobacco in the last 30 days? (Cigarettes, Smokeless Tobacco, Cigars, and/or Pipes): Yes  Has this patient used any form of tobacco in the last 30 days? (Cigarettes, Smokeless Tobacco, Cigars, and/or Pipes) Yes, Yes, A prescription for an FDA-approved tobacco cessation medication was offered at discharge and the patient refused  Blood Alcohol level:  Lab Results  Component Value Date   [facility] <10 05/26/2017   ETH <10 05/08/2017    Metabolic Disorder Labs:  Lab Results  Component Value Date   HGBA1C 5.3 05/27/2017   MPG 105.41 05/27/2017   MPG 105 10/16/2016   Lab Results  Component Value Date   PROLACTIN 8.8 10/16/2016   PROLACTIN 49.9 (H) 06/07/2016   Lab Results  Component Value Date   CHOL 182 05/27/2017   TRIG 254 (H) 05/27/2017   HDL 49 05/27/2017   CHOLHDL 3.7 05/27/2017   VLDL 51 (H) 05/27/2017   LDLCALC 82 05/27/2017   LDLCALC 75 10/16/2016    See Psychiatric Specialty Exam and Suicide Risk  Assessment completed by Attending Physician prior to discharge.  Discharge destination:  Home  Is patient on multiple antipsychotic therapies at discharge:  Yes,  Risperdal, Haldol, Seroquel Do you recommend tapering to monotherapy for antipsychotics?  Yes, may attempt to taper off Seroquel in the future once Haldol becomes therapeutic   Has Patient had three or more failed trials of antipsychotic monotherapy by history:  Yes  Recommended Plan for Multiple Antipsychotic Therapies: Taper to monotherapy as described:  Taper off Serouqel in the future if tolerated. This was decreased from 400 mg to 200 mg while in the hospital  Discharge Instructions    Increase activity slowly   Complete by:  As directed      Allergies as of 06/02/2017      Reactions   Azithromycin Anaphylaxis   Nsaids Other (See Comments)  Reaction:  Stomach ulcers    Tolmetin Other (See Comments)   Reaction:  Stomach ulcers       Medication List    STOP taking these medications   carbamazepine 200 MG tablet Commonly known as:  TEGRETOL   FLUoxetine 10 MG tablet Commonly known as:  PROZAC Replaced by:  FLUoxetine 10 MG capsule   FLUoxetine 20 MG tablet Commonly known as:  PROZAC   risperiDONE 3 MG tablet Commonly known as:  RISPERDAL   sertraline 50 MG tablet Commonly known as:  ZOLOFT   SUBOXONE 12-3 MG Film Generic drug:  Buprenorphine HCl-Naloxone HCl     TAKE these medications     Indication  benztropine 0.5 MG tablet Commonly known as:  COGENTIN Take 1 tablet (0.5 mg total) by mouth at bedtime. For prevention of drug induced tremors.  Indication:  Extrapyramidal Reaction caused by Medications   FLUoxetine 10 MG capsule Commonly known as:  PROZAC Take 3 capsules (30 mg total) by mouth daily. Replaces:  FLUoxetine 10 MG tablet  Indication:  Depression   gabapentin 600 MG tablet Commonly known as:  NEURONTIN Take 1 tablet (600 mg total) by mouth 3 (three) times daily.  Indication:   Neuropathic Pain   haloperidol 5 MG tablet Commonly known as:  HALDOL Take 1 tablet (5 mg total) by mouth 3 (three) times daily.  Indication:  Psychosis   levothyroxine 50 MCG tablet Commonly known as:  SYNTHROID, LEVOTHROID Take 1 tablet (50 mcg total) by mouth daily before breakfast. For low thyroid Hormone  Indication:  Underactive Thyroid   mirtazapine 7.5 MG tablet Commonly known as:  REMERON Take 1 tablet (7.5 mg total) by mouth at bedtime. For depression/sleep What changed:    medication strength  how much to take  Indication:  Trouble Sleeping, Major Depressive Disorder   prazosin 2 MG capsule Commonly known as:  MINIPRESS Take 1 capsule (2 mg total) by mouth at bedtime. For nightmares  Indication:  PTSD related nightmares   QUEtiapine 200 MG tablet Commonly known as:  SEROQUEL Take 1 tablet (200 mg total) by mouth at bedtime. For mood control What changed:    medication strength  how much to take  Indication:  Mood stabilization      Follow-up Information    [facility], [facility]. Go on 06/07/2017.   Why:  Please arrive to your scheduled appointment on Wednesday 06/07/2016 at 8am. Please bring proof of income if you have income. Thank you. Contact information: [address] [place] [address] [phone]           Follow-up recommendations:  Follow up with [facility]  Signed: Haskell RilingHolly R Ethel Veronica, MD 06/02/2017, 9:16 AM

## 2017-06-02 NOTE — Progress Notes (Signed)
  [facility] :  Will you be returning to the same living situation after discharge:  No. Going to live with boyfriend. At discharge, do you have transportation home?: Yes,  Boyfriend will come pick her up Do you have the ability to pay for your medications: Yes,  Reffered to a provider who can assist.  Release of information consent forms completed and in the chart;  Patient's signature needed at discharge.  Patient to Follow up at: Follow-up Information    [facility]. Go on 06/07/2017.   Why:  Please arrive to your scheduled appointment on Wednesday 06/07/2016 at 8am. Please bring proof of income if you have income. Thank you. Contact information: [address] [place] [id number] [phone]           Next level of care provider [facility]:no  Safety Planning and Suicide Prevention discussed: Yes,  Completed with patient. Unable to get in contact with patients mother Dedra SkeensGwen.  Have you used any form of tobacco in the last 30 days? (Cigarettes, Smokeless Tobacco, Cigars, and/or Pipes): Yes  Has patient been referred to the Quitline?: Patient refused referral  Patient has been referred for addiction treatment: Yes  Johny ShearsCassandra  Mileigh Tilley, LCSW 06/02/2017, 9:06 AM

## 2017-06-02 NOTE — BHH Suicide Risk Assessment (Signed)
[facility]   Principal Problem: Schizoaffective disorder, bipolar type [facility](HCC) Discharge Diagnoses:  Patient Active Problem List   Diagnosis Date Noted  . Schizoaffective disorder, bipolar type (HCC) [F25.0] 05/26/2017    Priority: High  . Alcohol abuse [F10.10] 05/26/2017  . Polysubstance abuse (HCC) [F19.10]   . Altered mental state [R41.82] 05/08/2017  . Hallucinations [R44.3]   . Bipolar disorder, curr episode mixed, severe, with psychotic features (HCC) [F31.64] 11/02/2016  . PTSD (post-traumatic stress disorder) [F43.10] 11/02/2016  . GAD (generalized anxiety disorder) [F41.1] 10/16/2016  . Opioid use disorder, moderate, dependence (HCC) [F11.20] 06/06/2016  . Sedative, hypnotic or anxiolytic use disorder, severe, dependence (HCC) [F13.20] 06/06/2016  . Cocaine use disorder, severe, dependence (HCC) [F14.20] 06/06/2016  . Cannabis use disorder, severe, dependence (HCC) [F12.20] 06/06/2016  . MDD (major depressive disorder), recurrent severe, without psychosis (HCC) [F33.2] 06/03/2016  . Bipolar I disorder, most recent episode depressed (HCC) [F31.30] 07/04/2015  . Overdose [T50.901A] 07/02/2015  . Lactic acidosis [E87.2] 07/02/2015  . Acute respiratory failure (HCC) [J96.00] 07/02/2015  . Sepsis (HCC) [A41.9] 07/02/2015  . AKI (acute kidney injury) (HCC) [N17.9]   . Altered mental status [R41.82]   . Pyrexia [R50.9]   . Abdominal pain, epigastric [R10.13] 06/17/2015  . Hepatitis C antibody test positive [R76.8] 06/17/2015  . Peptic ulcer disease [K27.9] 06/17/2015  . Epigastric pain [R10.13]   . Transaminitis [R74.0] 06/16/2015  . History of substance abuse [Z87.898] 06/16/2015      Mental Status Per Nursing Assessment::   On Admission:  Suicidal ideation indicated by patient  Demographic Factors:  Caucasian and Unemployed  Loss Factors: NA  Historical Factors: NA  Risk Reduction Factors:   Living with another person, especially a  relative, Positive social support, Positive therapeutic relationship and Positive coping skills or problem solving skills  Continued Clinical Symptoms:  None  Cognitive Features That Contribute To Risk:  None    Suicide Risk:  Minimal: No identifiable suicidal ideation.  Patients presenting with no risk factors but with morbid ruminations; may be classified as minimal risk based on the severity of the depressive symptoms  Follow-up Information    [facility]. Go on 06/07/2017.   Why:  Please arrive to your scheduled appointment on Wednesday 06/07/2016 at 8am. Please bring proof of income if you have income. Thank you. Contact information: [address] [place] [id number] [phone]           Plan Of Care/Follow-up recommendations:  [facility]  Viktor Philipp R Yenesis Even, MD 06/02/2017, 9:15 AM

## 2017-06-02 NOTE — BHH Group Notes (Signed)
  06/02/2017  Time: 0930  Type of Therapy and Topic:  Group Therapy:  Feelings around Relapse and Recovery  Participation Level:  Did Not Attend   Description of Group:    Patients in this group will discuss emotions they experience before and after a relapse. They will process how experiencing these feelings, or avoidance of experiencing them, relates to having a relapse. Facilitator will guide patients to explore emotions they have related to recovery. Patients will be encouraged to process which emotions are more powerful. They will be guided to discuss the emotional reaction significant others in their lives may have to their relapse or recovery. Patients will be assisted in exploring ways to respond to the emotions of others without this contributing to a relapse.  Therapeutic Goals: 1. Patient will identify two or more emotions that lead to a relapse for them 2. Patient will identify two emotions that result when they relapse 3. Patient will identify two emotions related to recovery 4. Patient will demonstrate ability to communicate their needs through discussion and/or role plays   Summary of Patient Progress: Pt was invited to attend group but chose not to attend. CSW will continue to encourage pt to attend group throughout their admission.     Therapeutic Modalities:   Cognitive Behavioral Therapy Solution-Focused Therapy Assertiveness Training Relapse Prevention Therapy  Heidi DachKelsey Quinetta Shilling, MSW, LCSW 06/02/2017 10:16 AM

## 2017-06-02 NOTE — Progress Notes (Signed)
Recreation Therapy Notes  INPATIENT RECREATION TR PLAN  Patient Details Name: Becky [name] MRN: [id number] DOB: [birth year] Today's Date: 06/02/2017  Rec Therapy Plan Is patient appropriate for Therapeutic Recreation?: Yes Treatment times per week: at least 3 Estimated Length of Stay: 5-7 days TR Treatment/Interventions: Group participation (Comment)  Discharge Criteria Pt will be discharged from therapy if:: Discharged Treatment plan/goals/alternatives discussed and agreed upon by:: Patient/family  Discharge Summary Short term goals set: Patient will identify 3 positive replacements for unhealthy/harmful habits x5 days.  Short term goals met: Not met Progress toward goals comments: Groups attended Which groups?: Self-esteem Reason goals not met: Patient soent most of her time in her room Therapeutic equipment acquired: N/A Reason patient discharged from therapy: Discharge from hospital Pt/family agrees with progress & goals achieved: Yes Date patient discharged from therapy: 06/02/17   Almee Pelphrey 06/02/2017, 11:42 AM

## 2017-06-02 NOTE — Progress Notes (Signed)
Patient ID: Becky [name], female   DOB: 03/10/1991, 27 y.o.   MRN: [id number] Getting along well with her new roommate, less fixated on medications; no PRNs during this shift; visible in the milieu, denied SI/HI/AVH.

## 2017-07-12 ENCOUNTER — Emergency Department (HOSPITAL_COMMUNITY)
Admission: EM | Admit: 2017-07-12 | Discharge: 2017-07-12 | Disposition: A | Discharge status: Home / Self Care | Payer: Self-pay | Attending: Emergency Medicine | Admitting: Emergency Medicine

## 2017-07-12 ENCOUNTER — Other Ambulatory Visit: Payer: Self-pay

## 2017-07-12 ENCOUNTER — Emergency Department (HOSPITAL_COMMUNITY): Payer: Self-pay

## 2017-07-12 ENCOUNTER — Encounter (HOSPITAL_COMMUNITY): Payer: Self-pay | Admitting: Emergency Medicine

## 2017-07-12 DIAGNOSIS — M25572 Pain in left ankle and joints of left foot: Secondary | ICD-10-CM | POA: Insufficient documentation

## 2017-07-12 DIAGNOSIS — Z5321 Procedure and treatment not carried out due to patient leaving prior to being seen by health care provider: Secondary | ICD-10-CM | POA: Insufficient documentation

## 2017-07-12 NOTE — ED Triage Notes (Signed)
Pt states fell yesterday twisting left ankle. Pt is ambulatory.

## 2017-07-12 NOTE — ED Notes (Signed)
Called for room X1 no answer 

## 2017-08-04 ENCOUNTER — Other Ambulatory Visit: Payer: Self-pay

## 2017-08-04 ENCOUNTER — Encounter (HOSPITAL_COMMUNITY): Payer: Self-pay | Admitting: Nurse Practitioner

## 2017-08-04 ENCOUNTER — Emergency Department (HOSPITAL_COMMUNITY)
Admission: EM | Admit: 2017-08-04 | Discharge: 2017-08-04 | Disposition: A | Discharge status: Home / Self Care | Payer: Self-pay | Attending: Emergency Medicine | Admitting: Emergency Medicine

## 2017-08-04 DIAGNOSIS — Z79899 Other long term (current) drug therapy: Secondary | ICD-10-CM | POA: Insufficient documentation

## 2017-08-04 DIAGNOSIS — E039 Hypothyroidism, unspecified: Secondary | ICD-10-CM | POA: Insufficient documentation

## 2017-08-04 DIAGNOSIS — F1910 Other psychoactive substance abuse, uncomplicated: Secondary | ICD-10-CM | POA: Insufficient documentation

## 2017-08-04 DIAGNOSIS — F259 Schizoaffective disorder, unspecified: Secondary | ICD-10-CM | POA: Insufficient documentation

## 2017-08-04 DIAGNOSIS — F17210 Nicotine dependence, cigarettes, uncomplicated: Secondary | ICD-10-CM | POA: Insufficient documentation

## 2017-08-04 LAB — COMPREHENSIVE METABOLIC PANEL
ALT: 69 U/L — ABNORMAL HIGH (ref 14–54)
ANION GAP: 8 (ref 5–15)
AST: 61 U/L — ABNORMAL HIGH (ref 15–41)
Albumin: 3.4 g/dL — ABNORMAL LOW (ref 3.5–5.0)
Alkaline Phosphatase: 96 U/L (ref 38–126)
BUN: 11 mg/dL (ref 6–20)
CHLORIDE: 106 mmol/L (ref 101–111)
CO2: 26 mmol/L (ref 22–32)
Calcium: 9 mg/dL (ref 8.9–10.3)
Creatinine, Ser: 0.74 mg/dL (ref 0.44–1.00)
GFR calc Af Amer: >60 mL/min (ref >60)
GFR calc non Af Amer: >60 mL/min (ref >60)
GLUCOSE: 91 mg/dL (ref 65–99)
POTASSIUM: 4 mmol/L (ref 3.5–5.1)
SODIUM: 140 mmol/L (ref 135–145)
Total Bilirubin: 0.4 mg/dL (ref 0.3–1.2)
Total Protein: 6.7 g/dL (ref 6.5–8.1)

## 2017-08-04 LAB — CBC
HEMATOCRIT: 38.7 % (ref 36.0–46.0)
HEMOGLOBIN: 13.1 g/dL (ref 12.0–15.0)
MCH: 31.2 pg (ref 26.0–34.0)
MCHC: 33.9 g/dL (ref 30.0–36.0)
MCV: 92.1 fL (ref 78.0–100.0)
Platelets: 286 10*3/uL (ref 150–400)
RBC: 4.2 MIL/uL (ref 3.87–5.11)
RDW: 13 % (ref 11.5–15.5)
WBC: 6.8 10*3/uL (ref 4.0–10.5)

## 2017-08-04 LAB — ETHANOL: Alcohol, Ethyl (B): <10 mg/dL (ref <10)

## 2017-08-04 LAB — RAPID URINE DRUG SCREEN, HOSP PERFORMED
Amphetamines: NOT DETECTED
BARBITURATES: NOT DETECTED
BENZODIAZEPINES: POSITIVE — AB
COCAINE: NOT DETECTED
Opiates: POSITIVE — AB
Tetrahydrocannabinol: POSITIVE — AB

## 2017-08-04 LAB — ACETAMINOPHEN LEVEL: Acetaminophen (Tylenol), Serum: <10 ug/mL — ABNORMAL LOW (ref 10–30)

## 2017-08-04 LAB — I-STAT BETA HCG BLOOD, ED (MC, WL, AP ONLY): I-stat hCG, quantitative: <5 m[IU]/mL (ref <5)

## 2017-08-04 LAB — SALICYLATE LEVEL: Salicylate Lvl: <7 mg/dL (ref 2.8–30.0)

## 2017-08-04 MED ORDER — LOPERAMIDE HCL 2 MG PO CAPS: 2.0000 mg | ORAL_CAPSULE | Freq: Four times a day (QID) | ORAL | 0 refills | Status: DC | PRN

## 2017-08-04 MED ORDER — DICYCLOMINE HCL 20 MG PO TABS: 20.0000 mg | ORAL_TABLET | Freq: Four times a day (QID) | ORAL | Status: DC | PRN

## 2017-08-04 MED ORDER — LOPERAMIDE HCL 2 MG PO CAPS: 2.0000 mg | ORAL_CAPSULE | ORAL | Status: DC | PRN

## 2017-08-04 MED ORDER — CLONIDINE HCL 0.1 MG PO TABS
0.1000 mg | ORAL_TABLET | Freq: Four times a day (QID) | ORAL | Status: DC
  Administered 2017-08-04: 0.1 mg via ORAL
  Filled 2017-08-04: qty 1

## 2017-08-04 MED ORDER — CLONIDINE HCL 0.1 MG PO TABS: 0.1000 mg | ORAL_TABLET | Freq: Two times a day (BID) | ORAL | Status: DC

## 2017-08-04 MED ORDER — ONDANSETRON 4 MG PO TBDP: 4.0000 mg | ORAL_TABLET | Freq: Four times a day (QID) | ORAL | Status: DC | PRN

## 2017-08-04 MED ORDER — ONDANSETRON 4 MG PO TBDP: 4.0000 mg | ORAL_TABLET | Freq: Three times a day (TID) | ORAL | 0 refills | Status: DC | PRN

## 2017-08-04 MED ORDER — HYDROXYZINE HCL 25 MG PO TABS: 25.0000 mg | ORAL_TABLET | Freq: Four times a day (QID) | ORAL | 0 refills | Status: DC | PRN

## 2017-08-04 MED ORDER — METHOCARBAMOL 500 MG PO TABS: 500.0000 mg | ORAL_TABLET | Freq: Three times a day (TID) | ORAL | Status: DC | PRN

## 2017-08-04 MED ORDER — CLONIDINE HCL 0.1 MG PO TABS: ORAL_TABLET | ORAL | 0 refills | Status: DC

## 2017-08-04 MED ORDER — CLONIDINE HCL 0.1 MG PO TABS: 0.1000 mg | ORAL_TABLET | Freq: Every day | ORAL | Status: DC

## 2017-08-04 MED ORDER — HYDROXYZINE HCL 25 MG PO TABS
25.0000 mg | ORAL_TABLET | Freq: Four times a day (QID) | ORAL | Status: DC | PRN
  Administered 2017-08-04: 25 mg via ORAL
  Filled 2017-08-04: qty 1

## 2017-08-04 MED ORDER — METHOCARBAMOL 500 MG PO TABS: 500.0000 mg | ORAL_TABLET | Freq: Three times a day (TID) | ORAL | 0 refills | Status: DC | PRN

## 2017-08-04 MED ORDER — DICYCLOMINE HCL 20 MG PO TABS: 20.0000 mg | ORAL_TABLET | Freq: Four times a day (QID) | ORAL | 0 refills | Status: DC | PRN

## 2017-08-04 NOTE — BH Assessment (Signed)
BHH Assessment Progress Note     Arlys JohnBrian from [facility] is off today and will not be able to see patient.  TTS has contacted [facility] for bed availability and waiting for a call back.

## 2017-08-04 NOTE — ED Provider Notes (Signed)
[facility]-EMERGENCY DEPT Provider Note   CSN: [id number] Arrival date & time: 08/04/17  0746     History   Chief Complaint Chief Complaint  Patient presents with  . Addiction Problem  . Hallucinations    HPI Becky [name] is a 27 y.o. female.  HPI Patient presents for help with substance abuse.  States that she has been using heroin with fentanyl.  States she used this morning.  Also used about 20-30 mg of benzodiazepine a day.  Last use that yesterday.  States she has schizoaffective.  Has been having hallucinations which is not unusual for her.  Has made some suicidal statements here.  However to me she said she is not suicidal and was saying that so she would get seen.  Patient states that she needs Suboxone.  States that she cannot have Suboxone then phenobarbital may work.  States she has been using less of the heroin for the last few days has had nausea vomiting diarrhea.  States she has had chills with it.  No chest pain.  Patient's story has changed somewhat during her stay also and I am somewhat unsure of the accuracy of any of it. Past Medical History:  Diagnosis Date  . Bipolar 1 disorder (HCC)   . Bronchitis   . GAD (generalized anxiety disorder)   . Hepatitis C antibody test positive 06/17/2015  . History of substance abuse 06/16/2015  . Hypothyroidism   . OCD (obsessive compulsive disorder)   . Peptic ulcer   . Polysubstance abuse (HCC)   . PTSD (post-traumatic stress disorder)     Patient Active Problem List   Diagnosis Date Noted  . Alcohol abuse 05/26/2017  . Schizoaffective disorder, bipolar type (HCC) 05/26/2017  . Polysubstance abuse (HCC)   . Altered mental state 05/08/2017  . Hallucinations   . Bipolar disorder, curr episode mixed, severe, with psychotic features (HCC) 11/02/2016  . PTSD (post-traumatic stress disorder) 11/02/2016  . GAD (generalized anxiety disorder) 10/16/2016  . Opioid use disorder, moderate,  dependence (HCC) 06/06/2016  . 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 Cocaine use disorder, severe, dependence (HCC) 06/06/2016  . Cannabis use disorder, severe, dependence (HCC) 06/06/2016  . 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 Bipolar I disorder, most recent episode depressed (HCC) 07/04/2015  . Overdose 07/02/2015  . Lactic acidosis 07/02/2015  . Acute respiratory failure (HCC) 07/02/2015  . Sepsis (HCC) 07/02/2015  . AKI (acute kidney injury) (HCC)   . Altered mental status   . Pyrexia   . Abdominal pain, epigastric 06/17/2015  . Hepatitis C antibody test positive 06/17/2015  . Peptic ulcer disease 06/17/2015  . Epigastric pain   . Transaminitis 06/16/2015  . History of substance abuse 06/16/2015    Past Surgical History:  Procedure Laterality Date  . CESAREAN SECTION    . OTHER SURGICAL HISTORY     scar tissue removed from right ovary   . OTHER SURGICAL HISTORY     fallopian tube repair  . WISDOM TOOTH EXTRACTION      OB History    Gravida  2   Para      Term      Preterm      AB  1   Living  1     SAB      TAB      Ectopic      Multiple      Live Births  Home Medications    Prior to Admission medications   Medication Sig Start Date End Date Taking? Authorizing Provider  benztropine (COGENTIN) 0.5 MG tablet Take 1 tablet (0.5 mg total) by mouth at bedtime. For prevention of drug induced tremors. 06/01/17  Yes McNew, Ileene Hutchinson, MD  FLUoxetine (PROZAC) 10 MG capsule Take 3 capsules (30 mg total) by mouth daily. 06/02/17 08/04/17 Yes McNew, Ileene Hutchinson, MD  gabapentin (NEURONTIN) 600 MG tablet Take 1 tablet (600 mg total) by mouth 3 (three) times daily. 06/01/17 08/04/17 Yes McNew, Ileene Hutchinson, MD  haloperidol (HALDOL) 5 MG tablet Take 1 tablet (5 mg total) by mouth 3 (three) times daily. 06/01/17 08/04/17 Yes McNew, Ileene Hutchinson, MD  levothyroxine  (SYNTHROID, LEVOTHROID) 50 MCG tablet Take 1 tablet (50 mcg total) by mouth daily before breakfast. For low thyroid Hormone 06/01/17  Yes McNew, Ileene Hutchinson, MD  mirtazapine (REMERON) 7.5 MG tablet Take 1 tablet (7.5 mg total) by mouth at bedtime. For depression/sleep 06/01/17  Yes McNew, Ileene Hutchinson, MD  prazosin (MINIPRESS) 2 MG capsule Take 1 capsule (2 mg total) by mouth at bedtime. For nightmares 06/01/17  Yes McNew, Ileene Hutchinson, MD  QUEtiapine (SEROQUEL) 200 MG tablet Take 1 tablet (200 mg total) by mouth at bedtime. For mood control Patient taking differently: Take 200-400 mg by mouth 3 (three) times daily. Takes 200mg  twice a day with meals and 400mg  at bedtime. For mood control. 06/01/17  Yes McNew, Ileene Hutchinson, MD  cloNIDine (CATAPRES) 0.1 MG tablet Take 1 tablet (0.1 mg total) by mouth 4 (four) times daily for 2 days, THEN 1 tablet (0.1 mg total) 2 (two) times daily for 2 days, THEN 1 tablet (0.1 mg total) daily for 2 days. 08/04/17 08/10/17  Benjiman Core, MD  dicyclomine (BENTYL) 20 MG tablet Take 1 tablet (20 mg total) by mouth every 6 (six) hours as needed for spasms. 08/04/17   Benjiman Core, MD  hydrOXYzine (ATARAX/VISTARIL) 25 MG tablet Take 1 tablet (25 mg total) by mouth every 6 (six) hours as needed. 08/04/17   Benjiman Core, MD  loperamide (IMODIUM) 2 MG capsule Take 1 capsule (2 mg total) by mouth 4 (four) times daily as needed for diarrhea or loose stools. 08/04/17   Benjiman Core, MD  methocarbamol (ROBAXIN) 500 MG tablet Take 1 tablet (500 mg total) by mouth every 8 (eight) hours as needed for muscle spasms. 08/04/17   Benjiman Core, MD  ondansetron (ZOFRAN-ODT) 4 MG disintegrating tablet Take 1 tablet (4 mg total) by mouth every 8 (eight) hours as needed for nausea or vomiting. 08/04/17   Benjiman Core, MD    Family History Family History  Problem Relation Age of Onset  . Drug abuse Brother   . Suicidality Brother   . Cancer Other   . Depression Mother   . Drug abuse  Mother   . Alcoholism Father   . Bipolar disorder Cousin     Social History Social History   Tobacco Use  . Smoking status: Current Every Day Smoker    Packs/day: 1.00    Years: 10.00    Pack years: 10.00    Types: Cigarettes  . Smokeless tobacco: Never Used  Substance Use Topics  . Alcohol use: Yes    Comment: occasional use  . Drug use: Yes    Types: Marijuana, Methamphetamines, MDMA (Ecstacy), Oxycodone, Benzodiazepines, Cocaine, IV    Comment:  Last used drugs 08/04/17     Allergies   Azithromycin; Nsaids; and Tolmetin  Review of Systems Review of Systems  Constitutional: Positive for appetite change and chills.  HENT: Negative for congestion.   Respiratory: Negative for shortness of breath.   Cardiovascular: Positive for chest pain.  Gastrointestinal: Positive for diarrhea, nausea and vomiting.  Genitourinary: Negative for flank pain.  Musculoskeletal: Positive for gait problem.  Skin: Positive for wound. Negative for rash.  Neurological: Negative for syncope.  Psychiatric/Behavioral: Positive for hallucinations and suicidal ideas.     Physical Exam Updated Vital Signs BP 111/69   Pulse 98   Temp 98.2 F (36.8 C) (Oral)   Resp 18   Ht 5\' 2"  (1.575 m)   Wt 81.6 kg (180 lb)   SpO2 98%   BMI 32.92 kg/m   Physical Exam  Constitutional: She appears well-developed.  HENT:  Head: Normocephalic.  Eyes: Pupils are equal, round, and reactive to light.  Neck: Thyromegaly present.  Cardiovascular: Normal rate.  No murmur heard. Pulmonary/Chest: Effort normal.  Abdominal: There is no tenderness.  Musculoskeletal: She exhibits no tenderness.  Mild erythematous area on left thumb.  Approximately 1.5 cm.  No fluctuance.  Neurological: She is alert.  Skin: Skin is warm. Capillary refill takes less than 2 seconds.  Psychiatric: Thought content normal.     ED Treatments / Results  Labs (all labs ordered are listed, but only abnormal results are  displayed) Labs Reviewed  COMPREHENSIVE METABOLIC PANEL - Abnormal; Notable for the following components:      Result Value   Albumin 3.4 (*)    AST 61 (*)    ALT 69 (*)    All other components within normal limits  ACETAMINOPHEN LEVEL - Abnormal; Notable for the following components:   Acetaminophen (Tylenol), Serum <10 (*)    All other components within normal limits  RAPID URINE DRUG SCREEN, HOSP PERFORMED - Abnormal; Notable for the following components:   Opiates POSITIVE (*)    Benzodiazepines POSITIVE (*)    Tetrahydrocannabinol POSITIVE (*)    All other components within normal limits  ETHANOL  SALICYLATE LEVEL  CBC  I-STAT BETA HCG BLOOD, ED ([facility], [facility], [facility] ONLY)    EKG  EKG Interpretation None       Radiology No results found.  Procedures Procedures (including critical care time)  Medications Ordered in ED Medications  cloNIDine (CATAPRES) tablet 0.1 mg (0.1 mg Oral Given 08/04/17 4696)    Followed by  cloNIDine (CATAPRES) tablet 0.1 mg (0.1 mg Oral Canceled Entry 08/04/17 0933)    Followed by  cloNIDine (CATAPRES) tablet 0.1 mg (has no administration in time range)  dicyclomine (BENTYL) tablet 20 mg (has no administration in time range)  hydrOXYzine (ATARAX/VISTARIL) tablet 25 mg (25 mg Oral Given 08/04/17 0932)  loperamide (IMODIUM) capsule 2-4 mg (has no administration in time range)  methocarbamol (ROBAXIN) tablet 500 mg (has no administration in time range)  ondansetron (ZOFRAN-ODT) disintegrating tablet 4 mg (has no administration in time range)     Initial Impression / Assessment and Plan / ED Course  I have reviewed the triage vital signs and the nursing notes.  Pertinent labs & imaging results that were available during my care of the patient were reviewed by me and considered in my medical decision making (see chart for details).     Polysubstance abuse.  Medically cleared.  Some question of depression but states she was just saying this to be  seen.  Did not like some of placement options.  Given resources.  Discharge home with treatment for her  opiate abuse.  Seen by TTS and psychologically discharge  Final Clinical Impressions(s) / ED Diagnoses   Final diagnoses:  Polysubstance abuse [facility])    ED Discharge Orders        Ordered    dicyclomine (BENTYL) 20 MG tablet  Every 6 hours PRN     08/04/17 1348    hydrOXYzine (ATARAX/VISTARIL) 25 MG tablet  Every 6 hours PRN     08/04/17 1348    loperamide (IMODIUM) 2 MG capsule  4 times daily PRN     08/04/17 1348    methocarbamol (ROBAXIN) 500 MG tablet  Every 8 hours PRN     08/04/17 1348    ondansetron (ZOFRAN-ODT) 4 MG disintegrating tablet  Every 8 hours PRN     08/04/17 1348    cloNIDine (CATAPRES) 0.1 MG tablet     08/04/17 1348       Benjiman Core, MD 08/04/17 1713

## 2017-08-04 NOTE — ED Notes (Signed)
Pt stated that she used heroine right before entering hospital. Pt asking for medication to help with withdrawing from the heroine and opioids, requesting suboxone.

## 2017-08-04 NOTE — BH Assessment (Signed)
[facility]  Per Juanetta BeetsJacqueline Norman, DO, this pt does not require psychiatric hospitalization at this time.  [facility]  This has been included in pt's discharge instructions.  Pt would also benefit from seeing [facility]; they will be asked to speak to pt.  Pt's nurse, Donnal DebarRandi, has been notified.  Doylene Canninghomas Americo Vallery, MA Triage Specialist [phone]

## 2017-08-04 NOTE — ED Notes (Signed)
Pt is irritable and refused to sign out because she is in a hurry to leave. She said that her boyfriend is here and that he is will be mad if she does not come out right now. She did not want the prescriptions, but took them anyway. She had asked staff where she can get a card to get hyperdermic needles from the pharmacies. Her belongings and discharge instructions were given to her, but she was not interested in them. She got dressed, and walked straight out the door without saying anything.

## 2017-08-04 NOTE — BH Assessment (Signed)
BHH Assessment Progress Note    Per Dr. Sharma CovertNorman, patient can be discharged with a referral to an outpatient provider for her SA issues.  [facility] [place]  Patient will also benefit from a peer support referral prior to her discharge
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
## 2017-08-04 NOTE — ED Notes (Signed)
Report given to RN

## 2017-08-04 NOTE — BH Assessment (Signed)
[facility] Assessment Progress Note      Contacted [facility], one female detox bed available. Faxed referral to them for review.

## 2017-08-04 NOTE — Discharge Instructions (Signed)
To help you maintain a sober lifestyle, a substance abuse treatment program may be beneficial to you.  Contact [facility] at your earliest opportunity to ask about enrolling in their program: ° °     [facility], [place] °     [address] °     [place], [place] [address] °     [phone] ° ° °

## 2017-08-04 NOTE — ED Triage Notes (Signed)
Patient was brought in by her boyfriend for SI and drug addiction. Patient stated she uses heroine and opiates. She wants help with withdrawals. Patient keeps changing her story everytime someone asks she gives everyone a different story.

## 2017-08-04 NOTE — BH Assessment (Signed)
[facility] Assessment Progress Note     Patient requested to see TTS.  Informed her [facility], but I was exploring options for her to get her a detox bed.  Patient stated that she wanted to leave.  Patient was cleared earlier this morning for discharge with referrals to other facilities by Dr. Sharma CovertNorman.

## 2017-10-05 ENCOUNTER — Emergency Department (HOSPITAL_COMMUNITY)
Admission: EM | Admit: 2017-10-05 | Discharge: 2017-10-05 | Discharge status: Against Medical Advice | Payer: Self-pay | Attending: Emergency Medicine | Admitting: Emergency Medicine

## 2017-10-05 ENCOUNTER — Encounter (HOSPITAL_COMMUNITY): Payer: Self-pay | Admitting: Cardiology

## 2017-10-05 ENCOUNTER — Emergency Department (HOSPITAL_COMMUNITY): Payer: Self-pay

## 2017-10-05 ENCOUNTER — Other Ambulatory Visit: Payer: Self-pay

## 2017-10-05 DIAGNOSIS — Z79899 Other long term (current) drug therapy: Secondary | ICD-10-CM | POA: Insufficient documentation

## 2017-10-05 DIAGNOSIS — F32A Depression, unspecified: Secondary | ICD-10-CM

## 2017-10-05 DIAGNOSIS — Y999 Unspecified external cause status: Secondary | ICD-10-CM | POA: Insufficient documentation

## 2017-10-05 DIAGNOSIS — Y929 Unspecified place or not applicable: Secondary | ICD-10-CM | POA: Insufficient documentation

## 2017-10-05 DIAGNOSIS — F329 Major depressive disorder, single episode, unspecified: Secondary | ICD-10-CM | POA: Insufficient documentation

## 2017-10-05 DIAGNOSIS — F17210 Nicotine dependence, cigarettes, uncomplicated: Secondary | ICD-10-CM | POA: Insufficient documentation

## 2017-10-05 DIAGNOSIS — Y939 Activity, unspecified: Secondary | ICD-10-CM | POA: Insufficient documentation

## 2017-10-05 DIAGNOSIS — S0011XA Contusion of right eyelid and periocular area, initial encounter: Secondary | ICD-10-CM | POA: Insufficient documentation

## 2017-10-05 DIAGNOSIS — F259 Schizoaffective disorder, unspecified: Secondary | ICD-10-CM | POA: Insufficient documentation

## 2017-10-05 DIAGNOSIS — E039 Hypothyroidism, unspecified: Secondary | ICD-10-CM | POA: Insufficient documentation

## 2017-10-05 DIAGNOSIS — F1129 Opioid dependence with unspecified opioid-induced disorder: Secondary | ICD-10-CM | POA: Insufficient documentation

## 2017-10-05 LAB — COMPREHENSIVE METABOLIC PANEL
ALBUMIN: 4.9 g/dL (ref 3.5–5.0)
ALK PHOS: 117 U/L (ref 38–126)
ALT: 75 U/L — AB (ref 14–54)
AST: 66 U/L — AB (ref 15–41)
Anion gap: 17 — ABNORMAL HIGH (ref 5–15)
BILIRUBIN TOTAL: 1.5 mg/dL — AB (ref 0.3–1.2)
BUN: 11 mg/dL (ref 6–20)
CALCIUM: 10.4 mg/dL — AB (ref 8.9–10.3)
CO2: 19 mmol/L — AB (ref 22–32)
CREATININE: 0.86 mg/dL (ref 0.44–1.00)
Chloride: 103 mmol/L (ref 101–111)
GFR calc Af Amer: >60 mL/min (ref >60)
GFR calc non Af Amer: >60 mL/min (ref >60)
GLUCOSE: 81 mg/dL (ref 65–99)
Potassium: 4.2 mmol/L (ref 3.5–5.1)
SODIUM: 139 mmol/L (ref 135–145)
TOTAL PROTEIN: 9.7 g/dL — AB (ref 6.5–8.1)

## 2017-10-05 LAB — RAPID URINE DRUG SCREEN, HOSP PERFORMED
AMPHETAMINES: NOT DETECTED
Barbiturates: NOT DETECTED
Benzodiazepines: POSITIVE — AB
Cocaine: POSITIVE — AB
Opiates: POSITIVE — AB
Tetrahydrocannabinol: POSITIVE — AB

## 2017-10-05 LAB — TSH: TSH: 0.334 u[IU]/mL — AB (ref 0.350–4.500)

## 2017-10-05 LAB — CBC
HCT: 48.8 % — ABNORMAL HIGH (ref 36.0–46.0)
Hemoglobin: 16.7 g/dL — ABNORMAL HIGH (ref 12.0–15.0)
MCH: 29.7 pg (ref 26.0–34.0)
MCHC: 34.2 g/dL (ref 30.0–36.0)
MCV: 86.7 fL (ref 78.0–100.0)
PLATELETS: 480 10*3/uL — AB (ref 150–400)
RBC: 5.63 MIL/uL — AB (ref 3.87–5.11)
RDW: 13.1 % (ref 11.5–15.5)
WBC: 16.5 10*3/uL — ABNORMAL HIGH (ref 4.0–10.5)

## 2017-10-05 LAB — ETHANOL: Alcohol, Ethyl (B): <10 mg/dL (ref <10)

## 2017-10-05 LAB — I-STAT BETA HCG BLOOD, ED (MC, WL, AP ONLY)

## 2017-10-05 MED ORDER — CLONIDINE HCL 0.1 MG PO TABS: 0.1000 mg | ORAL_TABLET | Freq: Two times a day (BID) | ORAL | Status: DC

## 2017-10-05 MED ORDER — METHOCARBAMOL 500 MG PO TABS: 500.0000 mg | ORAL_TABLET | Freq: Three times a day (TID) | ORAL | Status: DC | PRN

## 2017-10-05 MED ORDER — LOPERAMIDE HCL 2 MG PO CAPS: 2.0000 mg | ORAL_CAPSULE | ORAL | Status: DC | PRN

## 2017-10-05 MED ORDER — LORAZEPAM 2 MG/ML IJ SOLN
1.0000 mg | Freq: Once | INTRAMUSCULAR | Status: AC
  Administered 2017-10-05: 1 mg via INTRAVENOUS
  Filled 2017-10-05: qty 1

## 2017-10-05 MED ORDER — DICYCLOMINE HCL 10 MG PO CAPS: 20.0000 mg | ORAL_CAPSULE | Freq: Four times a day (QID) | ORAL | Status: DC | PRN

## 2017-10-05 MED ORDER — BUPRENORPHINE HCL-NALOXONE HCL 8-2 MG SL SUBL
1.0000 | SUBLINGUAL_TABLET | Freq: Every day | SUBLINGUAL | Status: DC
  Administered 2017-10-05: 1 via SUBLINGUAL
  Filled 2017-10-05: qty 1

## 2017-10-05 MED ORDER — SODIUM CHLORIDE 0.9 % IV BOLUS
1000.0000 mL | Freq: Once | INTRAVENOUS | Status: AC
  Administered 2017-10-05: 1000 mL via INTRAVENOUS

## 2017-10-05 MED ORDER — ONDANSETRON HCL 4 MG/2ML IJ SOLN
4.0000 mg | Freq: Once | INTRAMUSCULAR | Status: AC
  Administered 2017-10-05: 4 mg via INTRAVENOUS
  Filled 2017-10-05: qty 2

## 2017-10-05 MED ORDER — CLONIDINE HCL 0.1 MG PO TABS: 0.1000 mg | ORAL_TABLET | Freq: Every day | ORAL | Status: DC

## 2017-10-05 MED ORDER — ONDANSETRON 4 MG PO TBDP: 4.0000 mg | ORAL_TABLET | Freq: Four times a day (QID) | ORAL | Status: DC | PRN

## 2017-10-05 MED ORDER — SODIUM CHLORIDE 0.9 % IV BOLUS: 1000.0000 mL | Freq: Once | INTRAVENOUS | Status: DC

## 2017-10-05 MED ORDER — HYDROXYZINE HCL 25 MG PO TABS: 25.0000 mg | ORAL_TABLET | Freq: Four times a day (QID) | ORAL | Status: DC | PRN

## 2017-10-05 MED ORDER — CLONIDINE HCL 0.1 MG PO TABS: 0.1000 mg | ORAL_TABLET | Freq: Four times a day (QID) | ORAL | Status: DC

## 2017-10-05 MED ORDER — LOPERAMIDE HCL 2 MG PO CAPS
4.0000 mg | ORAL_CAPSULE | Freq: Once | ORAL | Status: AC
  Administered 2017-10-05: 4 mg via ORAL
  Filled 2017-10-05: qty 2
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
## 2017-10-05 NOTE — BH Assessment (Signed)
Tele Assessment Note   Patient Name: Becky [name] MRN: [id number] Referring Physician: Dr. Adriana Simas Location of Patient: [facility] Location of Provider: Behavioral Health TTS Department  Becky [name] is an 27 y.o. female. Pt denies SI/HI and AVH.Pt denies previous SI attempts. Pt reports daily methamphetamine, crack cocaine, and marijuana use.Pt states "I use whatever I can get my hands on." Pt denies current mental health treatment. Pt states she has not been able to afford medication. The Pt was informed about Freight forwarder. Pt states she was not aware that [facility] offered sliding scale fees. Pt reports multiple hospitalizations.  Pt states she would like long-term SA services. This Clinical research associate discussed the local SA rehabilitations. The Pt stated that she had been to many of the programs. There were some programs that the Pt had not been admitted.   [name], NP recommends D/C and follow-up with SA resources. Resources Attempted to find the Pt transportation to the SA programs but there were no local resources that would transport the Pt from [place] to surrounding counties.   Diagnosis:  F11.20 Opioid use, severe  Past Medical History:  Past Medical History:  Diagnosis Date  . Bipolar 1 disorder (HCC)   . Bronchitis   . GAD (generalized anxiety disorder)   . Hepatitis C antibody test positive 06/17/2015  . History of substance abuse 06/16/2015  . Hypothyroidism   . OCD (obsessive compulsive disorder)   . Peptic ulcer   . Polysubstance abuse (HCC)   . PTSD (post-traumatic stress disorder)     Past Surgical History:  Procedure Laterality Date  . CESAREAN SECTION    . OTHER SURGICAL HISTORY     scar tissue removed from right ovary   . OTHER SURGICAL HISTORY     fallopian tube repair  . WISDOM TOOTH EXTRACTION      Family History:  Family History  Problem Relation Age of Onset  . Drug abuse Brother   . Suicidality Brother   . Cancer Other   .  Depression Mother   . Drug abuse Mother   . Alcoholism Father   . Bipolar disorder Cousin     Social History:  reports that she has been smoking cigarettes.  She has a 10.00 pack-year smoking history. She has never used smokeless tobacco. She reports that she drinks alcohol. She reports that she has current or past drug history. Drugs: Marijuana, Methamphetamines, MDMA (Ecstacy), Oxycodone, Benzodiazepines, Cocaine, and IV.  Additional Social History:  Alcohol / Drug Use Pain Medications: please see mar Prescriptions: please see mar Over the Counter: please see mar History of alcohol / drug use?: Yes Longest period of sobriety (when/how long): unknown Substance #1 Name of Substance 1: heroin 1 - Age of First Use: unknown 1 - Amount (size/oz): unknown 1 - Frequency: daily 1 - Duration: ongoing 1 - Last Use / Amount: 10/04/17  CIWA: CIWA-Ar BP: (!) 138/120 Pulse Rate: (!) 116 COWS:    Allergies:  Allergies  Allergen Reactions  . Azithromycin Anaphylaxis  . Nsaids Other (See Comments)    Reaction:  Stomach ulcers    . Tolmetin Other (See Comments)    Reaction:  Stomach ulcers     Home Medications:  (Not in a hospital admission)  OB/GYN Status:  No LMP recorded.  General Assessment Data Location of Assessment: [facility] TTS Assessment: In system Is this a Tele or Face-to-Face Assessment?: Tele Assessment Is this an Initial Assessment or a Re-assessment for this encounter?: Initial Assessment Marital status: Single  Maiden name: NA Is patient pregnant?: No Pregnancy Status: No Living Arrangements: Other relatives Can pt return to current living arrangement?: No Admission Status: Voluntary Is patient capable of signing voluntary admission?: No Referral Source: Self/Family/Friend Insurance type: SP     Crisis Care Plan Living Arrangements: Other relatives Legal Guardian: Other:(self) Name of Psychiatrist: NA Name of Therapist: NA  Education Status Is patient  currently in school?: No  Risk to self with the past 6 months Suicidal Ideation: No Has patient been a risk to self within the past 6 months prior to admission? : No Suicidal Intent: No Has patient had any suicidal intent within the past 6 months prior to admission? : No Is patient at risk for suicide?: No Suicidal Plan?: No Has patient had any suicidal plan within the past 6 months prior to admission? : No Access to Means: No What has been your use of drugs/alcohol within the last 12 months?: NA Previous Attempts/Gestures: No How many times?: 0 Other Self Harm Risks: NA Triggers for Past Attempts: None known Intentional Self Injurious Behavior: None Family Suicide History: No Recent stressful life event(s): Other (Comment)(SA) Persecutory voices/beliefs?: No Depression: Yes Depression Symptoms: Feeling worthless/self pity, Loss of interest in usual pleasures Substance abuse history and/or treatment for substance abuse?: Yes Suicide prevention information given to non-admitted patients: Not applicable  Risk to Others within the past 6 months Homicidal Ideation: No Does patient have any lifetime risk of violence toward others beyond the six months prior to admission? : No Thoughts of Harm to Others: No Current Homicidal Intent: No Current Homicidal Plan: No Access to Homicidal Means: No Identified Victim: NA History of harm to others?: No Assessment of Violence: None Noted Violent Behavior Description: NA Does patient have access to weapons?: No Criminal Charges Pending?: No Does patient have a court date: No Is patient on probation?: No  Psychosis Hallucinations: None noted Delusions: None noted  Mental Status Report Appearance/Hygiene: Unremarkable Eye Contact: Fair Motor Activity: Freedom of movement Speech: Logical/coherent Level of Consciousness: Alert Mood: Euthymic Affect: Appropriate to circumstance Anxiety Level: Minimal Thought Processes: Coherent,  Relevant Judgement: Unimpaired Orientation: Person, Place, Time, Situation Obsessive Compulsive Thoughts/Behaviors: None  Cognitive Functioning Concentration: Normal Memory: Recent Intact, Remote Intact Is patient IDD: No Is patient DD?: No Insight: Poor Impulse Control: Poor Appetite: Fair Have you had any weight changes? : No Change Sleep: No Change Total Hours of Sleep: 8 Vegetative Symptoms: None  ADLScreening [facility]) Patient's cognitive ability adequate to safely complete daily activities?: Yes Patient able to express need for assistance with ADLs?: Yes Independently performs ADLs?: Yes (appropriate for developmental age)  Prior Inpatient Therapy Prior Inpatient Therapy: Yes Prior Therapy Dates: multiple Prior Therapy Facilty/Provider(s): [facility] Reason for Treatment: SA  Prior Outpatient Therapy Prior Outpatient Therapy: No Does patient have an ACCT team?: No Does patient have Intensive In-House Services?  : No Does patient have [facility] services? : No Does patient have P4CC services?: No  ADL Screening (condition at time of admission) Patient's cognitive ability adequate to safely complete daily activities?: Yes Is the patient deaf or have difficulty hearing?: No Does the patient have difficulty seeing, even when wearing glasses/contacts?: No Does the patient have difficulty concentrating, remembering, or making decisions?: No Patient able to express need for assistance with ADLs?: Yes Does the patient have difficulty dressing or bathing?: No Independently performs ADLs?: Yes (appropriate for developmental age)       Abuse/Neglect Assessment (Assessment to be complete while patient is alone)  Abuse/Neglect Assessment Can Be Completed: Yes Physical Abuse: Denies Verbal Abuse: Denies Sexual Abuse: Denies Exploitation of patient/patient's resources: Denies     Merchant navy officer (For Healthcare) Does Patient Have a Medical Advance Directive?:  No Would patient like information on creating a medical advance directive?: No - Patient declined    Additional Information 1:1 In Past 12 Months?: No CIRT Risk: No Elopement Risk: No Does patient have medical clearance?: Yes     Disposition:  Disposition Initial Assessment Completed for this Encounter: Yes Disposition of Patient: Discharge Patient refused recommended treatment: No Mode of transportation if patient is discharged?: Other Patient referred to: Outpatient clinic referral  This service was provided via telemedicine using a 2-way, interactive audio and video technology.  Names of all persons participating in this telemedicine service and their role in this encounter. Name: [name] Money Role: NP  Name: Role:   Name:  Role:   Name:  Role:     Emmit Pomfret 10/05/2017 4:11 PM

## 2017-10-05 NOTE — Progress Notes (Signed)
Patient is seen by me via tele-psych and I have consulted with Dr. Jola Babinski.  Patient has denied SI/HI/AVH and contracts for safety.  Then patient states walking she come to the [facility] hospital and patient is advised of need to be in a crisis situation including SI/HI/AVH, which she is denied.  Patient make statements such as I know I am dually diagnosed and I belong in the hospital and will now I am starting to feel suicidal if I do not have similar to go.  Patient is offered [employer] and social work are working on helping patient with transportation.  Patient is in agreement with this plan. Patient does not meet psychiatric inpatient criteria.
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
## 2017-10-05 NOTE — ED Notes (Signed)
Pt informed of treatment options. P

## 2017-10-05 NOTE — ED Notes (Signed)
Pt upset wanting money for ride to [facility]. Informed patient that she will need to call for ride. Pt calling mom and "CHuck". Pt cursing and seen walking out of ED without discharge instructions. Pt seen outside on sidewalk by lab tech pulling IV out. Per lab tech pt was seen walking down street.

## 2017-10-13 ENCOUNTER — Other Ambulatory Visit: Payer: Self-pay

## 2017-10-13 ENCOUNTER — Emergency Department (HOSPITAL_COMMUNITY)
Admission: EM | Admit: 2017-10-13 | Discharge: 2017-10-14 | Disposition: A | Discharge status: Home / Self Care | Payer: Self-pay | Attending: Emergency Medicine | Admitting: Emergency Medicine

## 2017-10-13 ENCOUNTER — Encounter (HOSPITAL_COMMUNITY): Payer: Self-pay | Admitting: Emergency Medicine

## 2017-10-13 DIAGNOSIS — F1910 Other psychoactive substance abuse, uncomplicated: Secondary | ICD-10-CM | POA: Insufficient documentation

## 2017-10-13 DIAGNOSIS — F319 Bipolar disorder, unspecified: Secondary | ICD-10-CM | POA: Insufficient documentation

## 2017-10-13 DIAGNOSIS — E039 Hypothyroidism, unspecified: Secondary | ICD-10-CM | POA: Insufficient documentation

## 2017-10-13 DIAGNOSIS — R45851 Suicidal ideations: Secondary | ICD-10-CM | POA: Insufficient documentation

## 2017-10-13 DIAGNOSIS — F17210 Nicotine dependence, cigarettes, uncomplicated: Secondary | ICD-10-CM | POA: Insufficient documentation

## 2017-10-13 DIAGNOSIS — Z79899 Other long term (current) drug therapy: Secondary | ICD-10-CM | POA: Insufficient documentation

## 2017-10-13 LAB — CBC WITH DIFFERENTIAL/PLATELET
Basophils Absolute: 0 10*3/uL (ref 0.0–0.1)
Basophils Relative: 1 %
Eosinophils Absolute: 0.2 10*3/uL (ref 0.0–0.7)
Eosinophils Relative: 2 %
HCT: 40.4 % (ref 36.0–46.0)
Hemoglobin: 13.5 g/dL (ref 12.0–15.0)
Lymphocytes Relative: 42 %
Lymphs Abs: 3.4 10*3/uL (ref 0.7–4.0)
MCH: 29.2 pg (ref 26.0–34.0)
MCHC: 33.4 g/dL (ref 30.0–36.0)
MCV: 87.4 fL (ref 78.0–100.0)
Monocytes Absolute: 0.5 10*3/uL (ref 0.1–1.0)
Monocytes Relative: 6 %
Neutro Abs: 4 10*3/uL (ref 1.7–7.7)
Neutrophils Relative %: 49 %
Platelets: 312 10*3/uL (ref 150–400)
RBC: 4.62 MIL/uL (ref 3.87–5.11)
RDW: 13.3 % (ref 11.5–15.5)
WBC: 8.1 10*3/uL (ref 4.0–10.5)

## 2017-10-13 LAB — BASIC METABOLIC PANEL
Anion gap: 10 (ref 5–15)
BUN: 5 mg/dL — ABNORMAL LOW (ref 6–20)
CO2: 27 mmol/L (ref 22–32)
Calcium: 9.5 mg/dL (ref 8.9–10.3)
Chloride: 101 mmol/L (ref 101–111)
Creatinine, Ser: 0.68 mg/dL (ref 0.44–1.00)
GFR calc Af Amer: >60 mL/min (ref >60)
GFR calc non Af Amer: >60 mL/min (ref >60)
Glucose, Bld: 113 mg/dL — ABNORMAL HIGH (ref 65–99)
Potassium: 3.8 mmol/L (ref 3.5–5.1)
Sodium: 138 mmol/L (ref 135–145)

## 2017-10-13 LAB — SALICYLATE LEVEL: Salicylate Lvl: <7 mg/dL (ref 2.8–30.0)

## 2017-10-13 LAB — RAPID URINE DRUG SCREEN, HOSP PERFORMED
Amphetamines: POSITIVE — AB
Barbiturates: POSITIVE — AB
Benzodiazepines: POSITIVE — AB
Cocaine: NOT DETECTED
Opiates: POSITIVE — AB
Tetrahydrocannabinol: POSITIVE — AB

## 2017-10-13 LAB — ACETAMINOPHEN LEVEL: Acetaminophen (Tylenol), Serum: <10 ug/mL — ABNORMAL LOW (ref 10–30)

## 2017-10-13 LAB — ETHANOL: Alcohol, Ethyl (B): <10 mg/dL (ref <10)

## 2017-10-13 MED ORDER — LEVOTHYROXINE SODIUM 50 MCG PO TABS
50.0000 ug | ORAL_TABLET | Freq: Every day | ORAL | Status: DC
  Administered 2017-10-14: 50 ug via ORAL
  Filled 2017-10-13: qty 1

## 2017-10-13 MED ORDER — BENZTROPINE MESYLATE 1 MG PO TABS
0.5000 mg | ORAL_TABLET | Freq: Every day | ORAL | Status: DC
  Administered 2017-10-13: 0.5 mg via ORAL
  Filled 2017-10-13: qty 1

## 2017-10-13 MED ORDER — CLONIDINE HCL 0.1 MG PO TABS
0.1000 mg | ORAL_TABLET | Freq: Two times a day (BID) | ORAL | Status: DC
  Administered 2017-10-14: 0.1 mg via ORAL
  Filled 2017-10-13: qty 1

## 2017-10-13 MED ORDER — LORAZEPAM 1 MG PO TABS
1.0000 mg | ORAL_TABLET | Freq: Once | ORAL | Status: AC
  Administered 2017-10-13: 1 mg via ORAL
  Filled 2017-10-13: qty 1

## 2017-10-13 MED ORDER — CARBAMAZEPINE 200 MG PO TABS
200.0000 mg | ORAL_TABLET | Freq: Three times a day (TID) | ORAL | Status: DC
  Administered 2017-10-13 – 2017-10-14 (×2): 200 mg via ORAL
  Filled 2017-10-13 (×2): qty 1

## 2017-10-13 MED ORDER — HYDROXYZINE HCL 25 MG PO TABS
25.0000 mg | ORAL_TABLET | Freq: Four times a day (QID) | ORAL | Status: DC | PRN
  Administered 2017-10-13 – 2017-10-14 (×2): 25 mg via ORAL
  Filled 2017-10-13 (×2): qty 1

## 2017-10-13 MED ORDER — QUETIAPINE FUMARATE 100 MG PO TABS
200.0000 mg | ORAL_TABLET | Freq: Three times a day (TID) | ORAL | Status: DC
  Administered 2017-10-13 – 2017-10-14 (×2): 400 mg via ORAL
  Filled 2017-10-13 (×2): qty 4

## 2017-10-13 MED ORDER — FLUOXETINE HCL 10 MG PO CAPS
30.0000 mg | ORAL_CAPSULE | Freq: Every day | ORAL | Status: DC
  Administered 2017-10-14: 30 mg via ORAL
  Filled 2017-10-13 (×3): qty 3

## 2017-10-13 MED ORDER — HALOPERIDOL 5 MG PO TABS
5.0000 mg | ORAL_TABLET | Freq: Three times a day (TID) | ORAL | Status: DC
  Administered 2017-10-13 – 2017-10-14 (×2): 5 mg via ORAL
  Filled 2017-10-13 (×2): qty 1

## 2017-10-13 NOTE — ED Triage Notes (Signed)
Per EMS patient states she is suicidal. Patient conscious, alert, and oriented. EMS reports patient stated that she took heroin this am. Patient states that she is hearing voices and having hallucinations.

## 2017-10-14 ENCOUNTER — Encounter (HOSPITAL_COMMUNITY): Payer: Self-pay | Admitting: Registered Nurse

## 2017-10-14 LAB — PREGNANCY, URINE: PREG TEST UR: NEGATIVE

## 2017-10-14 MED ORDER — NICOTINE 21 MG/24HR TD PT24
21.0000 mg | MEDICATED_PATCH | Freq: Once | TRANSDERMAL | Status: DC
  Administered 2017-10-14: 21 mg via TRANSDERMAL
  Filled 2017-10-14: qty 1

## 2017-10-14 NOTE — ED Provider Notes (Signed)
Pt screaming, yelling, cursing that she "wants my suboxone!" [facility]: we both chart reviewed pt's records dating back to 05/2017, and I accessed the [place] and [place], pt has 4 different names on file and none of those names have listed suboxone rx filled since 03/16/2017; pt received suboxone [facility] inpt at [facility], but then it is written under "stop taking these medications" on her discharge instructions. Pt is not showing signs of withdrawal from opiates at this time. Multiple other meds have been ordered for anxiety/depression, etc. Pt made aware.     Samuel JesterMcManus, Nevin Kozuch, DO 10/14/17 0900

## 2017-10-14 NOTE — ED Notes (Signed)
Pt upset she is not getting medicated with Suboxone.  Pt yelling and screaming at staff.  Advised pt she is not exhibiting withdrawal symptoms at this time.  Dr. Clarene DukeMcManus aware pt is requesting this and agrees it is not needed at this time as pt has not had a valid rx for it since November and due to her drug use she is not taking it appropriately anyways.
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
## 2017-10-14 NOTE — ED Provider Notes (Signed)
[facility] EMERGENCY DEPARTMENT Provider Note   CSN: [id number] Arrival date & time: 10/13/17  2123     History   Chief Complaint Chief Complaint  Patient presents with  . V70.1    HPI Becky PancoastDanielle [name] is a 27 y.o. female.  HPI 27 year old female with polysubstance abuse, auditory hallucinations and suicidal ideation.  Patient has a long standing history of poly-substance abuse.  She took methamphetamines yesterday and heroin earlier this morning.  She reports having auditory hallucinations.  She states that these are often worse after taking methamphetamines.  She is also been having suicidal ideations.  She has been having thoughts of overdosing on heroin.  Patient is very animated.  She is insistent that she needs a long-term residential treatment facility.   Past Medical History:  Diagnosis Date  . Bipolar 1 disorder (HCC)   . Bronchitis   . GAD (generalized anxiety disorder)   . Hepatitis C antibody test positive 06/17/2015  . History of substance abuse 06/16/2015  . Hypothyroidism   . OCD (obsessive compulsive disorder)   . Peptic ulcer   . Polysubstance abuse (HCC)   . PTSD (post-traumatic stress disorder)     Patient Active Problem List   Diagnosis Date Noted  . Alcohol abuse 05/26/2017  . Schizoaffective disorder, bipolar type (HCC) 05/26/2017  . Polysubstance abuse (HCC)   . Altered mental state 05/08/2017  . Hallucinations   . Bipolar disorder, curr episode mixed, severe, with psychotic features (HCC) 11/02/2016  . PTSD (post-traumatic stress disorder) 11/02/2016  . GAD (generalized anxiety disorder) 10/16/2016  . Opioid use disorder, moderate, dependence (HCC) 06/06/2016  . 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 Cocaine use disorder, severe, dependence (HCC) 06/06/2016  . Cannabis use disorder, severe, dependence (HCC) 06/06/2016  . MDD (major depressive disorder), recurrent severe, without psychosis (HCC)  06/03/2016  . Bipolar I disorder, most recent episode depressed (HCC) 07/04/2015  . Overdose 07/02/2015  . Lactic acidosis 07/02/2015  . Acute respiratory failure (HCC) 07/02/2015  . Sepsis (HCC) 07/02/2015  . AKI (acute kidney injury) (HCC)   . Altered mental status   . Pyrexia   . Abdominal pain, epigastric 06/17/2015  . Hepatitis C antibody test positive 06/17/2015  . Peptic ulcer disease 06/17/2015  . Epigastric pain   . Transaminitis 06/16/2015  . History of substance abuse 06/16/2015    Past Surgical History:  Procedure Laterality Date  . CESAREAN SECTION    . OTHER SURGICAL HISTORY     scar tissue removed from right ovary   . OTHER SURGICAL HISTORY     fallopian tube repair  . WISDOM TOOTH EXTRACTION       OB History    Gravida  2   Para      Term      Preterm      AB  1   Living  1     SAB      TAB      Ectopic      Multiple      Live Births               Home Medications    Prior to Admission medications   Medication Sig Start Date End Date Taking? Authorizing Provider  benztropine (COGENTIN) 0.5 MG tablet Take 1 tablet (0.5 mg total) by mouth at bedtime. For prevention of drug induced tremors. 06/01/17   McNew, Ileene HutchinsonHolly R, MD  Buprenorphine HCl-Naloxone HCl (SUBOXONE) 12-3 MG FILM Place 1  Film under the tongue daily.    [provider]  carbamazepine (TEGRETOL) 200 MG tablet Take 1 tablet by mouth 3 (three) times daily. 09/07/16   [provider]  cloNIDine (CATAPRES) 0.1 MG tablet Take 0.1 mg by mouth 2 (two) times daily.    [provider]  dicyclomine (BENTYL) 20 MG tablet Take 1 tablet (20 mg total) by mouth every 6 (six) hours as needed for spasms. 08/04/17   Benjiman Core, MD  FLUoxetine (PROZAC) 10 MG capsule Take 3 capsules (30 mg total) by mouth daily. 06/02/17 10/05/17  McNew, Ileene Hutchinson, MD  gabapentin (NEURONTIN) 600 MG tablet Take 1 tablet (600 mg total) by mouth 3 (three) times daily. 06/01/17 10/05/17   McNew, Ileene Hutchinson, MD  haloperidol (HALDOL) 5 MG tablet Take 1 tablet (5 mg total) by mouth 3 (three) times daily. 06/01/17 10/05/17  McNew, Ileene Hutchinson, MD  hydrOXYzine (ATARAX/VISTARIL) 25 MG tablet Take 1 tablet (25 mg total) by mouth every 6 (six) hours as needed. 08/04/17   Benjiman Core, MD  levothyroxine (SYNTHROID, LEVOTHROID) 50 MCG tablet Take 1 tablet (50 mcg total) by mouth daily before breakfast. For low thyroid Hormone 06/01/17   McNew, Ileene Hutchinson, MD  loperamide (IMODIUM) 2 MG capsule Take 1 capsule (2 mg total) by mouth 4 (four) times daily as needed for diarrhea or loose stools. 08/04/17   Benjiman Core, MD  methocarbamol (ROBAXIN) 500 MG tablet Take 1 tablet (500 mg total) by mouth every 8 (eight) hours as needed for muscle spasms. 08/04/17   Benjiman Core, MD  mirtazapine (REMERON) 7.5 MG tablet Take 1 tablet (7.5 mg total) by mouth at bedtime. For depression/sleep 06/01/17   Haskell Riling, MD  ondansetron (ZOFRAN-ODT) 4 MG disintegrating tablet Take 1 tablet (4 mg total) by mouth every 8 (eight) hours as needed for nausea or vomiting. 08/04/17   Benjiman Core, MD  prazosin (MINIPRESS) 2 MG capsule Take 1 capsule (2 mg total) by mouth at bedtime. For nightmares 06/01/17   McNew, Ileene Hutchinson, MD  QUEtiapine (SEROQUEL) 200 MG tablet Take 1 tablet (200 mg total) by mouth at bedtime. For mood control Patient taking differently: Take 200-400 mg by mouth 3 (three) times daily. Takes 200mg  twice a day with meals and 400mg  at bedtime. For mood control. 06/01/17   McNew, Ileene Hutchinson, MD    Family History Family History  Problem Relation Age of Onset  . Drug abuse Brother   . Suicidality Brother   . Cancer Other   . Depression Mother   . Drug abuse Mother   . Alcoholism Father   . Bipolar disorder Cousin     Social History Social History   Tobacco Use  . Smoking status: Current Every Day Smoker    Packs/day: 1.00    Years: 10.00    Pack years: 10.00    Types: Cigarettes  . Smokeless  tobacco: Never Used  Substance Use Topics  . Alcohol use: Yes    Comment: occasional use  . Drug use: Yes    Types: Marijuana, Methamphetamines, MDMA (Ecstacy), Oxycodone, Benzodiazepines, Cocaine, IV    Comment: uses heroin, crack and marijuana,  last used 10/04/2017     Allergies   Azithromycin; Nsaids; and Tolmetin   Review of Systems Review of Systems  All systems reviewed and negative, other than as noted in HPI.  Physical Exam Updated Vital Signs BP 135/84 (BP Location: Left Arm)   Pulse (!) 130   Temp 98.7 F (37.1 C) (  Oral)   Resp 20   Ht 5\' 2"  (1.575 m)   Wt 79.8 kg (176 lb)   SpO2 97%   BMI 32.19 kg/m    Physical Exam  Constitutional: She appears well-developed and well-nourished. No distress.  HENT:  Head: Normocephalic and atraumatic.  Eyes: Conjunctivae are normal. Right eye exhibits no discharge. Left eye exhibits no discharge.  Neck: Neck supple.  Cardiovascular: Regular rhythm and normal heart sounds. Exam reveals no gallop and no friction rub.  No murmur heard. Tachycardic  Pulmonary/Chest: Effort normal and breath sounds normal. No respiratory distress.  Abdominal: Soft. She exhibits no distension. There is no tenderness.  Musculoskeletal: She exhibits no edema or tenderness.  Neurological: She is alert.  Skin: Skin is warm and dry.  Psychiatric:  Standing in room and pacing.  Somewhat agitated.  Speech is a little pressured.  Thought process is fairly logical.  Poor insight.  Nursing note and vitals reviewed.    ED Treatments / Results  Labs (all labs ordered are listed, but only abnormal results are displayed) Labs Reviewed  RAPID URINE DRUG SCREEN, HOSP PERFORMED - Abnormal; Notable for the following components:      Result Value   Opiates POSITIVE (*)    Benzodiazepines POSITIVE (*)    Amphetamines POSITIVE (*)    Tetrahydrocannabinol POSITIVE (*)    Barbiturates POSITIVE (*)    All other components within normal limits  BASIC  METABOLIC PANEL - Abnormal; Notable for the following components:   Glucose, Bld 113 (*)    BUN 5 (*)    All other components within normal limits  ACETAMINOPHEN LEVEL - Abnormal; Notable for the following components:   Acetaminophen (Tylenol), Serum <10 (*)    All other components within normal limits  CBC WITH DIFFERENTIAL/PLATELET  ETHANOL  SALICYLATE LEVEL    EKG None  Radiology No results found.  Procedures Procedures (including critical care time)  Medications Ordered in ED Medications  benztropine (COGENTIN) tablet 0.5 mg (0.5 mg Oral Given 10/13/17 2323)  carbamazepine (TEGRETOL) tablet 200 mg (200 mg Oral Given 10/13/17 2324)  cloNIDine (CATAPRES) tablet 0.1 mg (0.1 mg Oral Not Given 10/13/17 2319)  FLUoxetine (PROZAC) capsule 30 mg (has no administration in time range)  haloperidol (HALDOL) tablet 5 mg (5 mg Oral Given 10/13/17 2325)  hydrOXYzine (ATARAX/VISTARIL) tablet 25 mg (25 mg Oral Given 10/13/17 2325)  levothyroxine (SYNTHROID, LEVOTHROID) tablet 50 mcg (has no administration in time range)  QUEtiapine (SEROQUEL) tablet 200-400 mg (400 mg Oral Given 10/13/17 2324)  LORazepam (ATIVAN) tablet 1 mg (1 mg Oral Given 10/13/17 2219)     Initial Impression / Assessment and Plan / ED Course  I have reviewed the triage vital signs and the nursing notes.  Pertinent labs & imaging results that were available during my care of the patient were reviewed by me and considered in my medical decision making (see chart for details).     27 year old female with polysubstance abuse, auditory hallucinations and suicidal ideation.  Medically cleared.  She is tachycardic but she currently appears to be manic and has been using amphetamines.  Final Clinical Impressions(s) / ED Diagnoses   Final diagnoses:  Polysubstance abuse [facility])  Suicidal ideation    ED Discharge Orders    None      Raeford Razor, MD 10/14/17 [phone]

## 2017-10-14 NOTE — Consult Note (Signed)
  Tele Assessment   Becky [name], 27 y.o., female patient presented to [facility] with complaints of suicidal ideation and auditory hallucinations after doing crystal meth and heroin.  Patient seen via telepsych by this provider; chart reviewed and consulted with Dr. Lucianne MussKumar on 10/14/17.  On evaluation Becky [name] reports that she came to the hospital because she was hearing voices.  Patient states that she has done crystal meth, marijuana, and heroin.  Patient states that she does heroin and marijuana everyday.  Patients UDS is positive for opiates, Benzo, amphetamines, THC, and barbiturates.  Patient states that the voices got worse after she done the crystal meth.  Patient states [facility] they told me that they can give me a diagnosis because of my drug use; but [facility] they diagnosed me with schizoaffective disorder and [facility] told me once I had a diagnosis I could come back."  Patient states she wants to get admitted to a long-term rehab facility " I am tired of these 7-10 day places and then I just get discharged.  I do not never get help long enough to help me not want to do the drugs.  I do not have to leave my house I can have the drugs brought to my door.  I need to go somewhere for a long time."  Patient denies suicidal/self-harm/homicidal ideation and paranoia.  Patient continues to endorse auditory hallucinations there are non command.  She reports that the voices are not as bad as yesterday.  Patient reports that she does not follow-up with any outpatient psychiatric services because she lives and [place] and the closest place she can go for services is and [facility] which is "along way and I do not have any transportation."  Patient reports that she has called several long-term rehab facilities seeking bed placement herself but has been unable to get accepted anywhere. During evaluation Becky [name] is alert/oriented x 4; cooperative  but agitated "cause ya'll won't send me to rehab"'; and mood congruent with affect.  She does not appear to be responding to internal/external stimuli or delusional thoughts.  Patient denied suicidal/self-harm/homicidal ideation, psychosis, and paranoia.  Patient answered question appropriately.  Patient cleared psychiatrically. [facility] called and stated that they did not have any beds available at this time but could put patient on a waiting list; but patient would have to call daily to see when bed was available. Called [facility] [place] and was informed that they had 4 beds available.  Patient was informed that beds were available but she would have to get there for the assessment interview and there was no guarantee that she would be accepted; could possibly do the interview over the phone.    Recommendations:  Patient psychiatrically cleared.  Patient to follow up with referral/resources given.  [facility]  Disposition: No evidence of imminent risk to self or others at present.   `Patient does not meet criteria for psychiatric inpatient admission. Marland Kitchen. Spoke with Dr Clarene DukeMcManus (EDP) about above recommendation/disposition.  Patient psychiatrically cleared.    Assunta FoundShuvon Rankin, NP

## 2017-10-14 NOTE — Progress Notes (Signed)
LCSW has faxed resources to patient's nurse [place] substance abuse [place].   Moss McKy-Sha Affan Callow MSW, LCSW-A, LCAS-A Clinical Social Worker 10/14/2017 12:39 PM

## 2017-10-14 NOTE — Discharge Instructions (Signed)
Substance Abuse Treatment Programs ° °Intensive Outpatient Programs °[facility]     °[address]      °[place], [place]                   °[phone]      ° °[facility] °[address] °[place], [place] °[phone] ° °[facility]     °(Inpatient and outpatient)     °[address]           °[phone]   ° °[facility] °[phone] (Suboxone and Methadone) ° °[address]      °[place], [place] [address]      °[phone]      ° °[address] °[place], [place] °852-3033 ° °[facility] (Outpatient/Inpatient, Chemical)    °(insurance only) [phone]      °       °[facility] (Groups & Residential) °[place], [place] °[phone] ° °   °[facility]     °[address]     °[place], [place]      °[phone]      ° °[facility] (for caregivers and family) °[address] °[place], [place] °336-299-4655 ° ° ° ° ° °Residential Treatment Programs °[facility]      °[address], [place], [place] [address]  °([phone]      ° °[facility] °[address] [place], [place] [address] °[phone] ° °[facility]        °[phone]      ° °[facility] °[phone] ° °[facility]             °[address]                                         °[place], [place]                                                °877-615-2722 or 336-784-9470                              ° °[facility] [place] °[address] °[place], [address] °[phone] ° °[facility]    °[address]      °, [place]     °[phone]      ° °[facility] °[address] °, [place] °336-285-9073 ° °[facility]   °[address]     °[place], [place] [address]     °[phone]      °Admissions: 8am-3pm M-F ° °[facility] °[address] °[place] °336-227-7417 ° °[facility]: Residential Program (90 Days)   °[place], [place]      °336-725-8389 or 800-758-6077    ° °[facility] °[place], [place] °(Walk in Hours over the weekend or by referral) ° °[facility] °[address], [place], [place] [address] °([phone]) [phone] ° °Crisis Mobile: [facility]:  1-877-626-1772 (for crisis response 24 hours a day) °[facility] Hotline:      1-800-256-2452 °Outpatient Psychiatry and Counseling ° °[facility]: Mobile Crisis   Management 24 hours:  1-877-626-1772 ° °[facility] sliding scale fee and walk in schedule: M-F 8am-12pm/1pm-3pm °[address]  °[place], [place] [address] °[phone] ° °[facility] °[address] °[place], [place] [address] °336-703-9650 ° °[facility] ([facility])- new patient walk-in appointments available Monday - Friday 8am -3pm.          °[address] °[place], [place] [address] °336-676-6840 or crisis line- 336-676-6905 ° °[facility] Intensive [facility]patient Therapy Program °[address] °[place], [place] [address] °336-832-9804 ° °[facility]                  °Crisis Services      °[phone]      °[address]     °[place], [place] [address]                ° °[facility]   °[facility] °[phone] °[address] °[place], [place] [address] ° ° °[facility]          °[address]  °[place], [place] [address]       °([phone]) [phone] ° °[facility] °[address] °[place], [place] [address] °336-292-1510 ° °[facility]    °[address]    °[place], [place] [address]     °336-632-3505      ° °Parish McKinney, MD     °[address]     °[place] [address]     °336-282-1251     °  °[facility] °[address] °[place] [address] ° °[facility]     °[address]     °[place], [place]      °336-542-2076      ° °[facility] °Shamsher Ahluwalia, MD °[address] °[place], [place] [address] °336-420-9558 ° °[facility]     °[address]     °[place], [place] [address]     °336-274-1237      ° °[facility] °[address] °[place], [place] [address] °336-854-4450 °Resources for Temporary Residential Assistance/Crisis Centers ° °DAY CENTERS °[facility] °M-F 8am-3pm   °[address] [place], [place] [address]   336-332-0824 °Services include: laundry, barbering, support groups, case management, phone  & computer access, showers, AA/NA mtgs, mental health/substance abuse nurse, job skills class, disability information, VA assistance, spiritual classes, etc.  ° °HOMELESS SHELTERS ° °[facility]     °[facility]   °[address], [place]     °336.271.5959       °       °[facility] (women and children)       °[address] °[place], [place] [address] °336-275-0820 °[email or web address] for application and process °Application Required ° °[facility]   °[address]    °[place] [address]     °336.886.4922       °             °[facility] °[address] °[place], [place] [address] °336.273.5572 °336-235-0363(schedule application appt.) °Application Required ° °[facility] (women only)    °[address]     °[place], [place] [address]     °336-884-1039      °  Intake starts 6pm daily Need valid ID, SSC, & Police report [facility] [place] [address] [place], [place] [phone] Application Required  [facility] (men only)     [address]      [place], [place]     [phone]       [facility] (Pregnant women only) [address] [place], [place] [phone]  [facility]      [address]      [place], [place] [address]     [phone]             [facility] [address] [place], [place] [phone] 90 day commitment/SA/Application process  [facility](men only)     [address]     [place], [place]     [phone]       Check-in at [facility]            [facility] [address] [place], [place] [address] [phone] Men/Women/Women and Children must be there by 7 pm  [facility] [place], [place] [phone]                  [facility] [place] has a bed available for you. Go there after you leave the Emergency Department to start detox treatment. Take your usual prescriptions as previously directed.  Call your regular medical doctor Monday to schedule a follow up appointment within the next week.  Return to the Emergency Department immediately sooner if worsening.

## 2017-10-14 NOTE — ED Provider Notes (Signed)
Psych team has re-evaluated pt: pt denies SI/HI, there is no inpt criteria at this time, pt can be d/c to f/u with [facility] [place] to start detox (they have a bed available). Will d/c stable.    Samuel JesterMcManus, [place]abia Weatherwax, DO 10/14/17 1315

## 2017-11-16 IMAGING — US US ABDOMEN COMPLETE
1 series · 14 of 25 positions shown · non-contrast
Comparison: CT abdomen and pelvis 09/04/2013

CLINICAL DATA: Hepatitis-C, elevated LFTs, worsening abdominal
pain, smoker

EXAM:
ABDOMEN ULTRASOUND COMPLETE

[Series 1: us abdomen complete · 0.22mm/px · 14 of 104 slices shown]
[im 1/104]
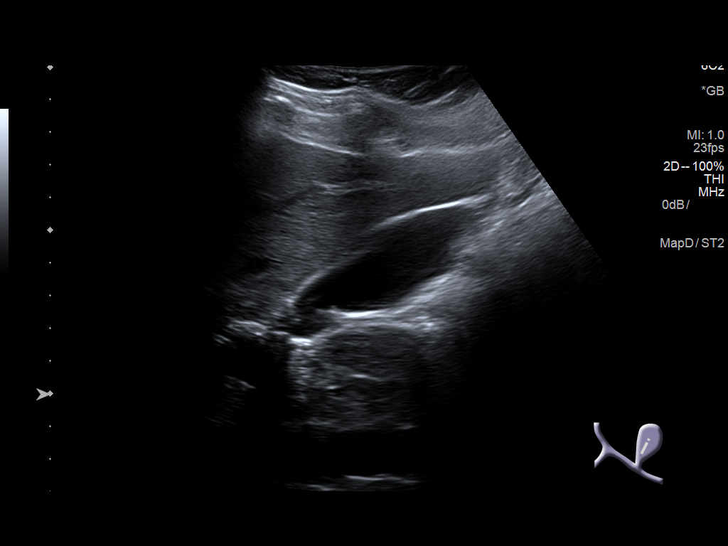
[im 9/104]
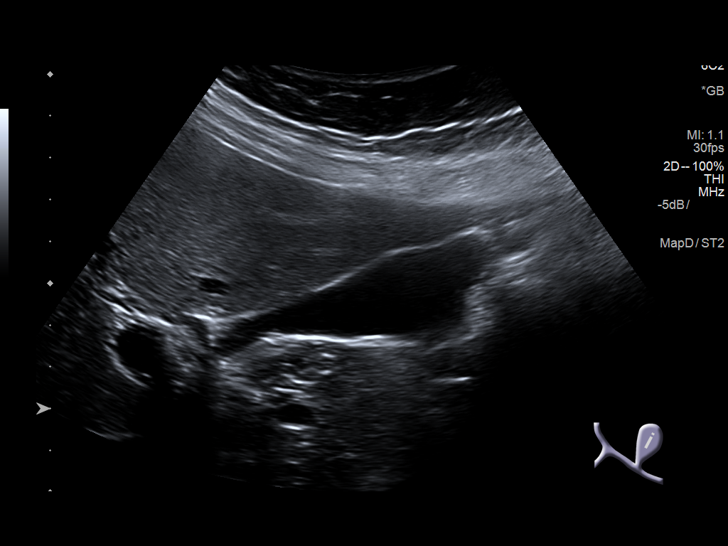
[im 18/104]
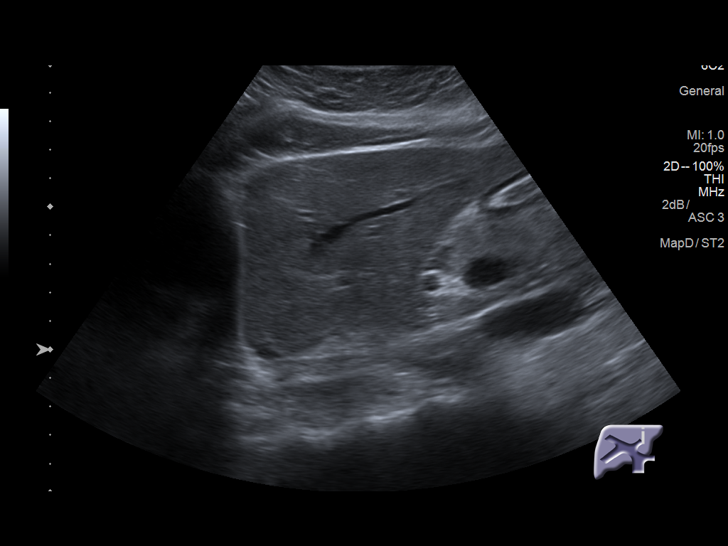
[im 26/104]
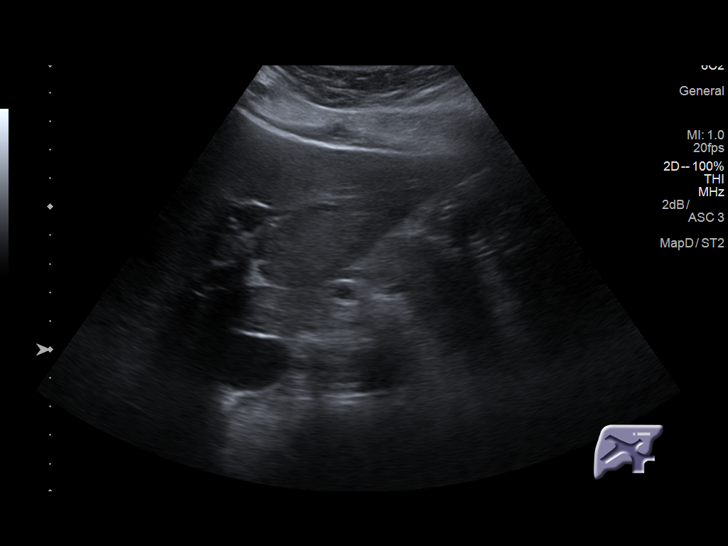
[im 35/104]
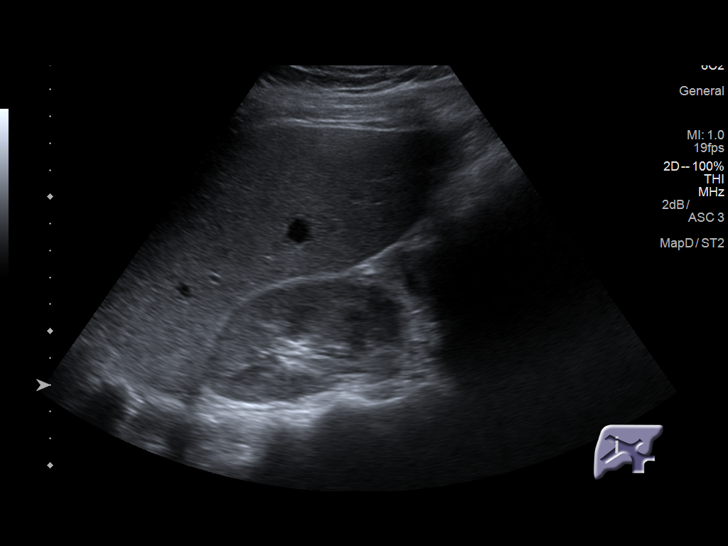
[im 39/104]
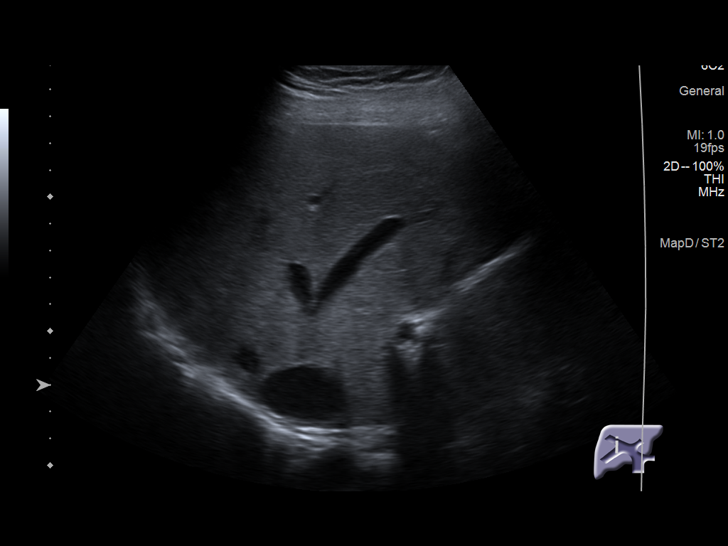
[im 48/104]
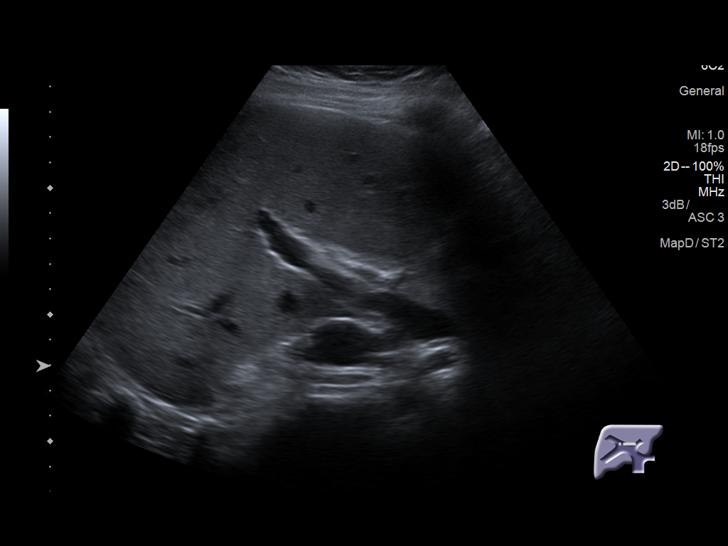
[im 56/104]
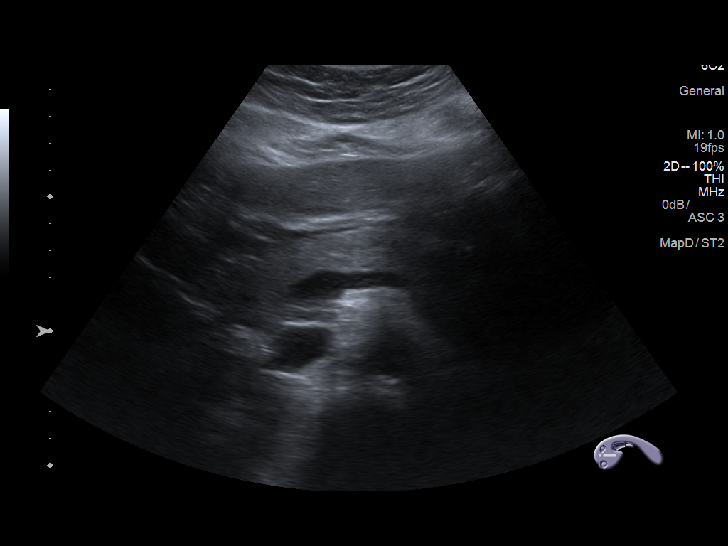
[im 65/104]
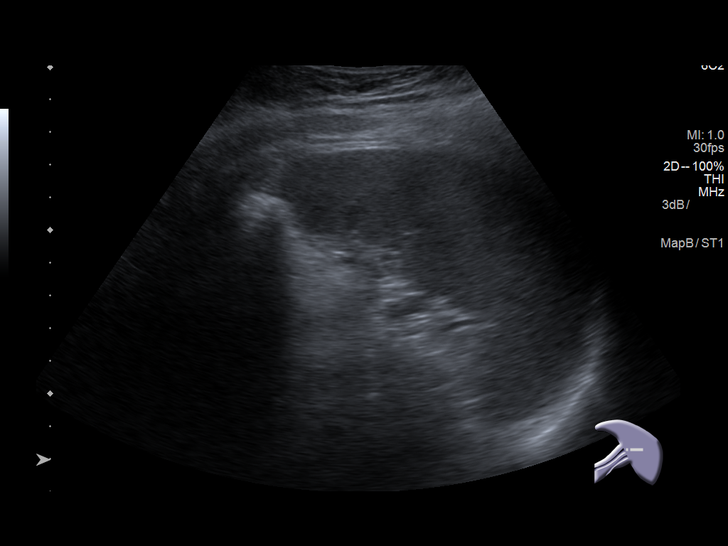
[im 69/104]
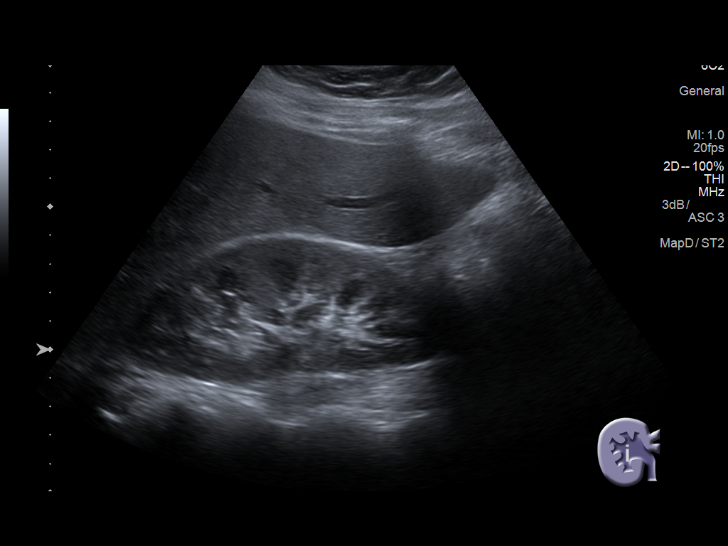
[im 78/104]
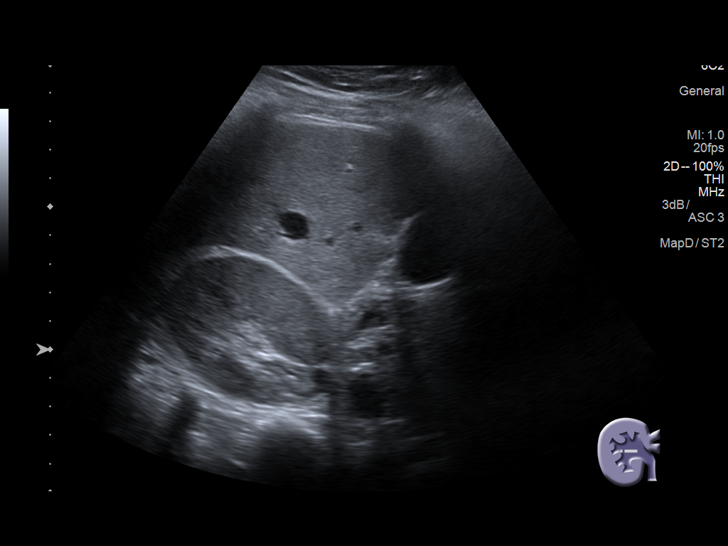
[im 86/104]
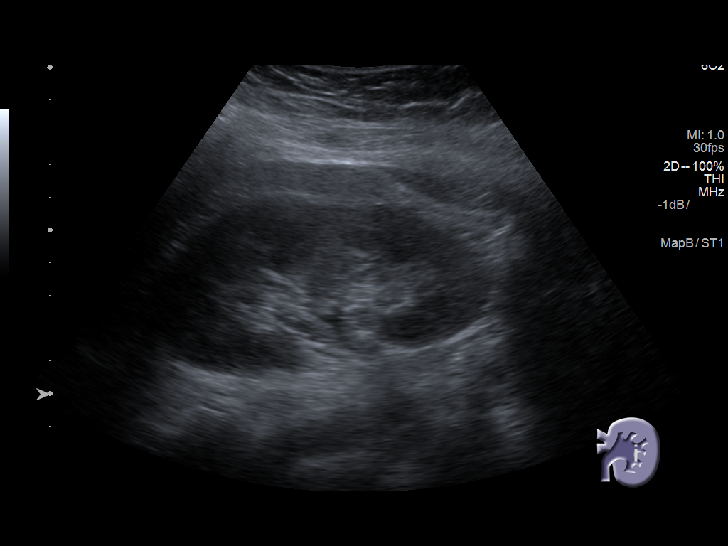
[im 95/104]
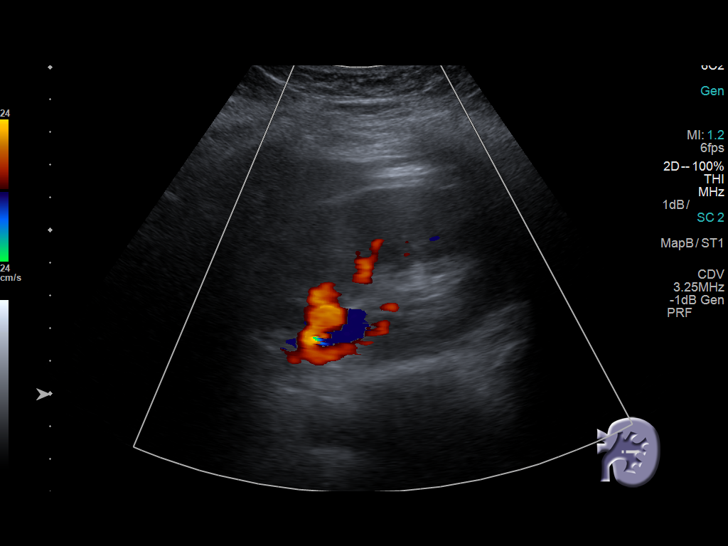
[im 104/104]
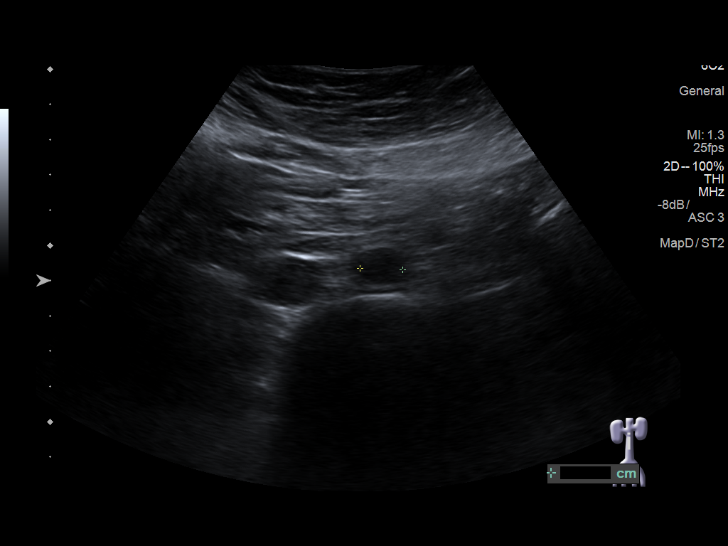

[14 of 25 positions shown; findings below may reference images not displayed]

FINDINGS: Gallbladder: Normal distend without stones or wall thickening. No
pericholecystic fluid or sonographic Murphy sign.

Common bile duct: Diameter: Normal caliber 4 mm diameter

Liver: Normal appearance

IVC: Normal appearance

Pancreas: Tail obscured by bowel gas. Visualized portion normal
appearance.

Spleen: Normal appearance, 9.5 cm length

Right Kidney: Length: 11.7 cm. Normal morphology without mass or
hydronephrosis.

Left Kidney: Length: 11.8 cm. Normal morphology without mass or
hydronephrosis.

Abdominal aorta: Normal caliber

Other findings: No free fluid
IMPRESSION: Incomplete visualization of pancreatic tail.

Otherwise normal exam.

## 2017-12-01 ENCOUNTER — Emergency Department (HOSPITAL_COMMUNITY): Payer: Self-pay

## 2017-12-01 ENCOUNTER — Other Ambulatory Visit: Payer: Self-pay

## 2017-12-01 ENCOUNTER — Encounter (HOSPITAL_COMMUNITY): Payer: Self-pay | Admitting: Emergency Medicine

## 2017-12-01 ENCOUNTER — Emergency Department (HOSPITAL_COMMUNITY)
Admission: EM | Admit: 2017-12-01 | Discharge: 2017-12-01 | Disposition: A | Discharge status: Other Acute Inpatient Hospital | Payer: Self-pay | Attending: Emergency Medicine | Admitting: Emergency Medicine

## 2017-12-01 ENCOUNTER — Inpatient Hospital Stay (HOSPITAL_COMMUNITY)
Admission: AD | Admit: 2017-12-01 | Discharge: 2017-12-08 | DRG: 885 | Disposition: A | Discharge status: Home / Self Care | Payer: Federal, State, Local not specified - Other | Source: Intra-hospital | Attending: Psychiatry | Admitting: Psychiatry

## 2017-12-01 DIAGNOSIS — Z813 Family history of other psychoactive substance abuse and dependence: Secondary | ICD-10-CM

## 2017-12-01 DIAGNOSIS — F1320 Sedative, hypnotic or anxiolytic dependence, uncomplicated: Secondary | ICD-10-CM | POA: Insufficient documentation

## 2017-12-01 DIAGNOSIS — F1910 Other psychoactive substance abuse, uncomplicated: Secondary | ICD-10-CM | POA: Diagnosis present

## 2017-12-01 DIAGNOSIS — Z79899 Other long term (current) drug therapy: Secondary | ICD-10-CM

## 2017-12-01 DIAGNOSIS — Z886 Allergy status to analgesic agent status: Secondary | ICD-10-CM | POA: Diagnosis not present

## 2017-12-01 DIAGNOSIS — Z881 Allergy status to other antibiotic agents status: Secondary | ICD-10-CM

## 2017-12-01 DIAGNOSIS — W1839XA Other fall on same level, initial encounter: Secondary | ICD-10-CM | POA: Insufficient documentation

## 2017-12-01 DIAGNOSIS — F251 Schizoaffective disorder, depressive type: Secondary | ICD-10-CM | POA: Insufficient documentation

## 2017-12-01 DIAGNOSIS — F259 Schizoaffective disorder, unspecified: Secondary | ICD-10-CM | POA: Diagnosis present

## 2017-12-01 DIAGNOSIS — F411 Generalized anxiety disorder: Secondary | ICD-10-CM | POA: Diagnosis present

## 2017-12-01 DIAGNOSIS — X58XXXA Exposure to other specified factors, initial encounter: Secondary | ICD-10-CM | POA: Diagnosis present

## 2017-12-01 DIAGNOSIS — F4310 Post-traumatic stress disorder, unspecified: Secondary | ICD-10-CM | POA: Diagnosis present

## 2017-12-01 DIAGNOSIS — F1120 Opioid dependence, uncomplicated: Secondary | ICD-10-CM | POA: Diagnosis not present

## 2017-12-01 DIAGNOSIS — F17210 Nicotine dependence, cigarettes, uncomplicated: Secondary | ICD-10-CM | POA: Insufficient documentation

## 2017-12-01 DIAGNOSIS — Z599 Problem related to housing and economic circumstances, unspecified: Secondary | ICD-10-CM | POA: Diagnosis not present

## 2017-12-01 DIAGNOSIS — Z811 Family history of alcohol abuse and dependence: Secondary | ICD-10-CM | POA: Diagnosis not present

## 2017-12-01 DIAGNOSIS — S025XXA Fracture of tooth (traumatic), initial encounter for closed fracture: Secondary | ICD-10-CM | POA: Diagnosis present

## 2017-12-01 DIAGNOSIS — T50902A Poisoning by unspecified drugs, medicaments and biological substances, intentional self-harm, initial encounter: Secondary | ICD-10-CM

## 2017-12-01 DIAGNOSIS — Y9389 Activity, other specified: Secondary | ICD-10-CM | POA: Insufficient documentation

## 2017-12-01 DIAGNOSIS — K047 Periapical abscess without sinus: Secondary | ICD-10-CM | POA: Diagnosis present

## 2017-12-01 DIAGNOSIS — Z79891 Long term (current) use of opiate analgesic: Secondary | ICD-10-CM | POA: Diagnosis not present

## 2017-12-01 DIAGNOSIS — G4700 Insomnia, unspecified: Secondary | ICD-10-CM | POA: Diagnosis present

## 2017-12-01 DIAGNOSIS — Z915 Personal history of self-harm: Secondary | ICD-10-CM | POA: Diagnosis not present

## 2017-12-01 DIAGNOSIS — Z8619 Personal history of other infectious and parasitic diseases: Secondary | ICD-10-CM

## 2017-12-01 DIAGNOSIS — Y929 Unspecified place or not applicable: Secondary | ICD-10-CM | POA: Insufficient documentation

## 2017-12-01 DIAGNOSIS — F410 Panic disorder [episodic paroxysmal anxiety] without agoraphobia: Secondary | ICD-10-CM | POA: Diagnosis present

## 2017-12-01 DIAGNOSIS — Z818 Family history of other mental and behavioral disorders: Secondary | ICD-10-CM

## 2017-12-01 DIAGNOSIS — F1520 Other stimulant dependence, uncomplicated: Secondary | ICD-10-CM | POA: Diagnosis not present

## 2017-12-01 DIAGNOSIS — F1420 Cocaine dependence, uncomplicated: Secondary | ICD-10-CM | POA: Insufficient documentation

## 2017-12-01 DIAGNOSIS — Z9114 Patient's other noncompliance with medication regimen: Secondary | ICD-10-CM | POA: Diagnosis not present

## 2017-12-01 DIAGNOSIS — F1410 Cocaine abuse, uncomplicated: Secondary | ICD-10-CM | POA: Diagnosis not present

## 2017-12-01 DIAGNOSIS — T421X2A Poisoning by iminostilbenes, intentional self-harm, initial encounter: Secondary | ICD-10-CM | POA: Insufficient documentation

## 2017-12-01 DIAGNOSIS — E039 Hypothyroidism, unspecified: Secondary | ICD-10-CM | POA: Diagnosis present

## 2017-12-01 DIAGNOSIS — N3090 Cystitis, unspecified without hematuria: Secondary | ICD-10-CM

## 2017-12-01 DIAGNOSIS — R45851 Suicidal ideations: Secondary | ICD-10-CM | POA: Insufficient documentation

## 2017-12-01 DIAGNOSIS — T1491XA Suicide attempt, initial encounter: Secondary | ICD-10-CM

## 2017-12-01 DIAGNOSIS — F4010 Social phobia, unspecified: Secondary | ICD-10-CM | POA: Diagnosis present

## 2017-12-01 DIAGNOSIS — S0083XA Contusion of other part of head, initial encounter: Secondary | ICD-10-CM

## 2017-12-01 DIAGNOSIS — Y999 Unspecified external cause status: Secondary | ICD-10-CM | POA: Insufficient documentation

## 2017-12-01 DIAGNOSIS — Z046 Encounter for general psychiatric examination, requested by authority: Secondary | ICD-10-CM | POA: Insufficient documentation

## 2017-12-01 LAB — CBC WITH DIFFERENTIAL/PLATELET
BASOS ABS: 0 10*3/uL (ref 0.0–0.1)
Basophils Relative: 0 %
EOS ABS: 0 10*3/uL (ref 0.0–0.7)
Eosinophils Relative: 0 %
HCT: 44 % (ref 36.0–46.0)
HEMOGLOBIN: 15 g/dL (ref 12.0–15.0)
LYMPHS ABS: 3.2 10*3/uL (ref 0.7–4.0)
Lymphocytes Relative: 25 %
MCH: 30.6 pg (ref 26.0–34.0)
MCHC: 34.1 g/dL (ref 30.0–36.0)
MCV: 89.8 fL (ref 78.0–100.0)
Monocytes Absolute: 0.7 10*3/uL (ref 0.1–1.0)
Monocytes Relative: 5 %
NEUTROS PCT: 70 %
Neutro Abs: 9 10*3/uL — ABNORMAL HIGH (ref 1.7–7.7)
Platelets: 300 10*3/uL (ref 150–400)
RBC: 4.9 MIL/uL (ref 3.87–5.11)
RDW: 15.4 % (ref 11.5–15.5)
WBC: 12.9 10*3/uL — AB (ref 4.0–10.5)

## 2017-12-01 LAB — URINALYSIS, ROUTINE W REFLEX MICROSCOPIC
Glucose, UA: NEGATIVE mg/dL
Hgb urine dipstick: NEGATIVE
Ketones, ur: NEGATIVE mg/dL
Nitrite: NEGATIVE
PROTEIN: 100 mg/dL — AB
Specific Gravity, Urine: 1.03 (ref 1.005–1.030)
pH: 6 (ref 5.0–8.0)

## 2017-12-01 LAB — BASIC METABOLIC PANEL
Anion gap: 12 (ref 5–15)
BUN: 8 mg/dL (ref 6–20)
CO2: 28 mmol/L (ref 22–32)
CREATININE: 0.76 mg/dL (ref 0.44–1.00)
Calcium: 9.5 mg/dL (ref 8.9–10.3)
Chloride: 96 mmol/L — ABNORMAL LOW (ref 98–111)
GFR calc Af Amer: >60 mL/min (ref >60)
GLUCOSE: 115 mg/dL — AB (ref 70–99)
POTASSIUM: 3.6 mmol/L (ref 3.5–5.1)
Sodium: 136 mmol/L (ref 135–145)

## 2017-12-01 LAB — RAPID URINE DRUG SCREEN, HOSP PERFORMED
AMPHETAMINES: NOT DETECTED
BENZODIAZEPINES: POSITIVE — AB
COCAINE: NOT DETECTED
OPIATES: NOT DETECTED
Tetrahydrocannabinol: POSITIVE — AB

## 2017-12-01 LAB — HEPATIC FUNCTION PANEL
ALBUMIN: 4.6 g/dL (ref 3.5–5.0)
ALT: 140 U/L — ABNORMAL HIGH (ref 0–44)
AST: 63 U/L — AB (ref 15–41)
Alkaline Phosphatase: 151 U/L — ABNORMAL HIGH (ref 38–126)
Bilirubin, Direct: 0.2 mg/dL (ref 0.0–0.2)
Indirect Bilirubin: 0.8 mg/dL (ref 0.3–0.9)
Total Bilirubin: 1 mg/dL (ref 0.3–1.2)
Total Protein: 8.8 g/dL — ABNORMAL HIGH (ref 6.5–8.1)

## 2017-12-01 LAB — ETHANOL

## 2017-12-01 LAB — SALICYLATE LEVEL

## 2017-12-01 LAB — PREGNANCY, URINE: PREG TEST UR: NEGATIVE

## 2017-12-01 LAB — CARBAMAZEPINE LEVEL, TOTAL: CARBAMAZEPINE LVL: 12.3 ug/mL — AB (ref 4.0–12.0)

## 2017-12-01 LAB — ACETAMINOPHEN LEVEL

## 2017-12-01 MED ORDER — HYDROXYZINE HCL 25 MG PO TABS
25.0000 mg | ORAL_TABLET | Freq: Three times a day (TID) | ORAL | Status: DC | PRN
  Administered 2017-12-02 – 2017-12-03 (×3): 25 mg via ORAL
  Filled 2017-12-01 (×3): qty 1

## 2017-12-01 MED ORDER — ACETAMINOPHEN 325 MG PO TABS: 650.0000 mg | ORAL_TABLET | ORAL | Status: DC | PRN

## 2017-12-01 MED ORDER — ONDANSETRON 4 MG PO TBDP
4.0000 mg | ORAL_TABLET | Freq: Four times a day (QID) | ORAL | Status: DC | PRN
  Administered 2017-12-01: 4 mg via ORAL
  Filled 2017-12-01: qty 1

## 2017-12-01 MED ORDER — MAGNESIUM HYDROXIDE 400 MG/5ML PO SUSP: 30.0000 mL | Freq: Every day | ORAL | Status: DC | PRN

## 2017-12-01 MED ORDER — TRAZODONE HCL 50 MG PO TABS
50.0000 mg | ORAL_TABLET | Freq: Every evening | ORAL | Status: DC | PRN
  Administered 2017-12-01 – 2017-12-03 (×3): 50 mg via ORAL
  Filled 2017-12-01 (×3): qty 1

## 2017-12-01 MED ORDER — METHOCARBAMOL 500 MG PO TABS: 500.0000 mg | ORAL_TABLET | Freq: Three times a day (TID) | ORAL | Status: DC | PRN

## 2017-12-01 MED ORDER — DICYCLOMINE HCL 20 MG PO TABS
20.0000 mg | ORAL_TABLET | Freq: Four times a day (QID) | ORAL | Status: DC | PRN
  Filled 2017-12-01: qty 1

## 2017-12-01 MED ORDER — NICOTINE 21 MG/24HR TD PT24: 21.0000 mg | MEDICATED_PATCH | Freq: Every day | TRANSDERMAL | Status: DC | PRN

## 2017-12-01 MED ORDER — DICYCLOMINE HCL 10 MG PO CAPS: 20.0000 mg | ORAL_CAPSULE | Freq: Four times a day (QID) | ORAL | Status: DC | PRN

## 2017-12-01 MED ORDER — CLONIDINE HCL 0.1 MG PO TABS: 0.1000 mg | ORAL_TABLET | Freq: Two times a day (BID) | ORAL | Status: DC

## 2017-12-01 MED ORDER — CLONIDINE HCL 0.1 MG PO TABS: 0.1000 mg | ORAL_TABLET | Freq: Every day | ORAL | Status: DC

## 2017-12-01 MED ORDER — ACETAMINOPHEN 325 MG PO TABS
650.0000 mg | ORAL_TABLET | Freq: Once | ORAL | Status: AC
  Administered 2017-12-01: 650 mg via ORAL
  Filled 2017-12-01: qty 2

## 2017-12-01 MED ORDER — HYDROXYZINE HCL 25 MG PO TABS: 25.0000 mg | ORAL_TABLET | Freq: Four times a day (QID) | ORAL | Status: DC | PRN

## 2017-12-01 MED ORDER — ALUM & MAG HYDROXIDE-SIMETH 200-200-20 MG/5ML PO SUSP
30.0000 mL | ORAL | Status: DC | PRN
  Administered 2017-12-04 – 2017-12-06 (×3): 30 mL via ORAL
  Filled 2017-12-01 (×3): qty 30

## 2017-12-01 MED ORDER — CLONIDINE HCL 0.1 MG PO TABS
0.1000 mg | ORAL_TABLET | Freq: Four times a day (QID) | ORAL | Status: DC
  Administered 2017-12-01: 0.1 mg via ORAL
  Filled 2017-12-01: qty 1

## 2017-12-01 MED ORDER — NICOTINE POLACRILEX 2 MG MT GUM
2.0000 mg | CHEWING_GUM | OROMUCOSAL | Status: DC | PRN
Start: 2017-12-01 — End: 2017-12-08
  Administered 2017-12-01 – 2017-12-08 (×22): 2 mg via ORAL
  Filled 2017-12-01 (×16): qty 1

## 2017-12-01 MED ORDER — ACETAMINOPHEN 325 MG PO TABS
650.0000 mg | ORAL_TABLET | Freq: Four times a day (QID) | ORAL | Status: DC | PRN
  Administered 2017-12-02 – 2017-12-07 (×8): 650 mg via ORAL
  Filled 2017-12-01 (×8): qty 2

## 2017-12-01 MED ORDER — FOSFOMYCIN TROMETHAMINE 3 G PO PACK
3.0000 g | PACK | Freq: Once | ORAL | Status: AC
  Administered 2017-12-01: 3 g via ORAL
  Filled 2017-12-01 (×2): qty 3

## 2017-12-01 MED ORDER — LOPERAMIDE HCL 2 MG PO CAPS: 2.0000 mg | ORAL_CAPSULE | ORAL | Status: DC | PRN

## 2017-12-01 NOTE — ED Notes (Signed)
Patient lying in bed sleeping at this time. Equal rise and fall of chest noted. Statisticianfficer and sitter at bedside. Per officer, she was called by family members for patient "taking a bunch of pills." States "she said she took a bunch of pills today, but then she admitted it was yesterday. She hasn't done heroin in a couple of days and hasn't done meth in a while." States she took pills trying to end her life. Did not wake patient to assess SI/HI.

## 2017-12-01 NOTE — ED Triage Notes (Signed)
Pt is ivc pt took 10-12 tregatolol trying to get high and/or killing herself she is withdrawing from herion. She has not used in 2 days
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
## 2017-12-01 NOTE — ED Notes (Signed)
Patient requesting medication for headache. Advised Dr Clarene DukeMcManus. Verbal order for tylenol to be given.

## 2017-12-01 NOTE — Progress Notes (Signed)
[facility]. Becky Calkinsravis Money, NP is the accepting provider.   Dr. Jola Babinskilary is the attending provider.   Call report to [phone]. [facility], Becky [name] has notified appropriate [facility] staff. Pt is involuntary and can be transported by [employer] enforcement. [facility].   Becky GuilesSarah Keonia Pasko, LCSW, LCAS Disposition CSW [facility] [phone]

## 2017-12-01 NOTE — ED Notes (Signed)
Gave patient phone to use. 

## 2017-12-01 NOTE — ED Provider Notes (Signed)
[facility] EMERGENCY DEPARTMENT Provider Note   CSN: [id number] Arrival date & time: 12/01/17  1214     History   Chief Complaint Chief Complaint  Patient presents with  . V70.1    HPI Becky [name] is a 27 y.o. female.  HPI  Pt was seen at 1340. Per Police and pt:  Pt brought to ED by Police for s/p intentional OD on tegretol yesterday. LD heroin 2 days ago. Pt states she took 12 tegretol in [place] yesterday afternoon. States she "thinks I passed out" because her face and chest are sore and she has a new bruise to her chin and forehead.  Family called Police today when informed of this attempt. Pt continues to endorse SA, also stating she "feels I'm starting to withdraw from heroin." Pt also states she "thinks I have a urine infection." Denies HI, no hallucinations. Denies fevers, no vomiting, no back/flank pain.   Past Medical History:  Diagnosis Date  . Bipolar 1 disorder (HCC)   . Bronchitis   . GAD (generalized anxiety disorder)   . Hepatitis C antibody test positive 06/17/2015  . History of substance abuse 06/16/2015  . Hypothyroidism   . OCD (obsessive compulsive disorder)   . Peptic ulcer   . Polysubstance abuse (HCC)   . PTSD (post-traumatic stress disorder)     Patient Active Problem List   Diagnosis Date Noted  . Alcohol abuse 05/26/2017  . Schizoaffective disorder, bipolar type (HCC) 05/26/2017  . Polysubstance abuse (HCC)   . Altered mental state 05/08/2017  . Hallucinations   . Bipolar disorder, curr episode mixed, severe, with psychotic features (HCC) 11/02/2016  . PTSD (post-traumatic stress disorder) 11/02/2016  . GAD (generalized anxiety disorder) 10/16/2016  . Opioid use disorder, moderate, dependence (HCC) 06/06/2016  . 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 Cocaine use disorder, severe, dependence (HCC) 06/06/2016  . Cannabis use disorder, severe, dependence (HCC) 06/06/2016  . MDD (major depressive  disorder), recurrent severe, without psychosis (HCC) 06/03/2016  . Bipolar I disorder, most recent episode depressed (HCC) 07/04/2015  . Overdose 07/02/2015  . Lactic acidosis 07/02/2015  . Acute respiratory failure (HCC) 07/02/2015  . Sepsis (HCC) 07/02/2015  . AKI (acute kidney injury) (HCC)   . Altered mental status   . Pyrexia   . Abdominal pain, epigastric 06/17/2015  . Hepatitis C antibody test positive 06/17/2015  . Peptic ulcer disease 06/17/2015  . Epigastric pain   . Transaminitis 06/16/2015  . History of substance abuse 06/16/2015    Past Surgical History:  Procedure Laterality Date  . CESAREAN SECTION    . OTHER SURGICAL HISTORY     scar tissue removed from right ovary   . OTHER SURGICAL HISTORY     fallopian tube repair  . WISDOM TOOTH EXTRACTION       OB History    Gravida  2   Para      Term      Preterm      AB  1   Living  1     SAB      TAB      Ectopic      Multiple      Live Births               Home Medications    Prior to Admission medications   Medication Sig Start Date End Date Taking? Authorizing Provider  benztropine (COGENTIN) 0.5 MG tablet Take 1 tablet (0.5 mg total)  by mouth at bedtime. For prevention of drug induced tremors. 06/01/17   McNew, Ileene HutchinsonHolly R, MD  Buprenorphine HCl-Naloxone HCl (SUBOXONE) 12-3 MG FILM Place 1 Film under the tongue daily.    [provider]  carbamazepine (TEGRETOL) 200 MG tablet Take 1 tablet by mouth 3 (three) times daily. 09/07/16   [provider]  cloNIDine (CATAPRES) 0.1 MG tablet Take 0.1 mg by mouth 2 (two) times daily.    [provider]  dicyclomine (BENTYL) 20 MG tablet Take 1 tablet (20 mg total) by mouth every 6 (six) hours as needed for spasms. 08/04/17   Benjiman CorePickering, Nathan, MD  FLUoxetine (PROZAC) 10 MG capsule Take 3 capsules (30 mg total) by mouth daily. 06/02/17 10/05/17  McNew, Ileene HutchinsonHolly R, MD  gabapentin (NEURONTIN) 600 MG tablet Take 1 tablet (600 mg total)  by mouth 3 (three) times daily. 06/01/17 10/05/17  McNew, Ileene HutchinsonHolly R, MD  haloperidol (HALDOL) 5 MG tablet Take 1 tablet (5 mg total) by mouth 3 (three) times daily. 06/01/17 10/05/17  McNew, Ileene HutchinsonHolly R, MD  hydrOXYzine (ATARAX/VISTARIL) 25 MG tablet Take 1 tablet (25 mg total) by mouth every 6 (six) hours as needed. 08/04/17   Benjiman CorePickering, Nathan, MD  levothyroxine (SYNTHROID, LEVOTHROID) 50 MCG tablet Take 1 tablet (50 mcg total) by mouth daily before breakfast. For low thyroid Hormone 06/01/17   McNew, Ileene HutchinsonHolly R, MD  loperamide (IMODIUM) 2 MG capsule Take 1 capsule (2 mg total) by mouth 4 (four) times daily as needed for diarrhea or loose stools. 08/04/17   Benjiman CorePickering, Nathan, MD  methocarbamol (ROBAXIN) 500 MG tablet Take 1 tablet (500 mg total) by mouth every 8 (eight) hours as needed for muscle spasms. 08/04/17   Benjiman CorePickering, Nathan, MD  mirtazapine (REMERON) 7.5 MG tablet Take 1 tablet (7.5 mg total) by mouth at bedtime. For depression/sleep 06/01/17   Haskell RilingMcNew, Holly R, MD  ondansetron (ZOFRAN-ODT) 4 MG disintegrating tablet Take 1 tablet (4 mg total) by mouth every 8 (eight) hours as needed for nausea or vomiting. 08/04/17   Benjiman CorePickering, Nathan, MD  prazosin (MINIPRESS) 2 MG capsule Take 1 capsule (2 mg total) by mouth at bedtime. For nightmares 06/01/17   McNew, Ileene HutchinsonHolly R, MD  QUEtiapine (SEROQUEL) 200 MG tablet Take 1 tablet (200 mg total) by mouth at bedtime. For mood control Patient taking differently: Take 200-400 mg by mouth 3 (three) times daily. Takes 200mg  twice a day with meals and 400mg  at bedtime. For mood control. 06/01/17   McNew, Ileene HutchinsonHolly R, MD    Family History Family History  Problem Relation Age of Onset  . Drug abuse Brother   . Suicidality Brother   . Cancer Other   . Depression Mother   . Drug abuse Mother   . Alcoholism Father   . Bipolar disorder Cousin     Social History Social History   Tobacco Use  . Smoking status: Current Every Day Smoker    Packs/day: 1.00    Years: 10.00    Pack  years: 10.00    Types: Cigarettes  . Smokeless tobacco: Never Used  Substance Use Topics  . Alcohol use: Yes    Comment: occasional use  . Drug use: Yes    Types: Marijuana, Methamphetamines, MDMA (Ecstacy), Oxycodone, Benzodiazepines, Cocaine, IV    Comment: uses heroin, crack and marijuana,  last used 10/04/2017     Allergies   Azithromycin; Nsaids; and Tolmetin   Review of Systems Review of Systems ROS: Statement: All systems negative except as marked or  noted in the HPI; Constitutional: Negative for fever and chills. ; ; Eyes: Negative for eye pain, redness and discharge. ; ; ENMT: Negative for ear pain, hoarseness, nasal congestion, sinus pressure and sore throat. ; ; Cardiovascular: Negative for chest pain, palpitations, diaphoresis, dyspnea and peripheral edema. ; ; Respiratory: Negative for cough, wheezing and stridor. ; ; Gastrointestinal: Negative for nausea, vomiting, diarrhea, abdominal pain, blood in stool, hematemesis, jaundice and rectal bleeding. . ; ; Genitourinary: +dysuria. Negative for flank pain and hematuria. ; ; GYN:  No pelvic pain, no vaginal bleeding, no vaginal discharge, no vulvar pain. ;; Musculoskeletal: +face and head pain. Negative for back pain and neck pain. Negative for swelling and deformity.; ; Skin: +bruising. Negative for pruritus, rash, abrasions, blisters, and skin lesion.; ; Neuro: Negative for headache, lightheadedness and neck stiffness. Negative for weakness, altered level of consciousness, altered mental status, extremity weakness, paresthesias, involuntary movement, seizure and syncope.; Psych:  +SI, +SA, no HI, no hallucinations.        Physical Exam Updated Vital Signs BP 140/86 (BP Location: Right Arm)   Pulse 99   Temp 98.6 F (37 C) (Oral)   Ht 5\' 2"  (1.575 m)   Wt 81.6 kg (180 lb)   LMP 10/29/2017   SpO2 96%   BMI 32.92 kg/m   Physical Exam 1345: Physical examination: Vital signs and O2 SAT: Reviewed; Constitutional: Well  developed, Well nourished, Well hydrated, In no acute distress; Head and Face: Normocephalic, No scalp hematomas, no lacs. +left forehead contusion.  Non-tender to palp superior and inferior orbital rim areas.  No zygoma tenderness.  +localized mandibular tenderness and ecchymosis to chin..; Eyes: EOMI, PERRL, No scleral icterus; ENMT: Mouth and pharynx normal, Mucous membranes moist, +teeth and tongue intact.  No intraoral or intranasal bleeding.  No septal hematomas.  No trismus, no malocclusion.;  Neck: Supple,  Trachea midline; Spine: No midline CS, TS, LS tenderness.; Cardiovascular: Regular rate and rhythm, No gallop; Respiratory: Breath sounds clear & equal bilaterally, No wheezes, Normal respiratory effort/excursion; Chest: Nontender, No deformity, Movement normal, No crepitus, No abrasions or ecchymosis.; Abdomen: Soft, Nontender, Nondistended, Normal bowel sounds, No abrasions or ecchymosis.; Genitourinary: No CVA tenderness;; Extremities: Full range of motion major/large joints of bilat UE's and LE's without pain or tenderness to palp, Neurovascularly intact, Pulses normal, No deformity. No tenderness, No edema, Pelvis stable; Neuro: AA&Ox3, GCS 15.  Major CN grossly intact. Speech clear. No gross focal motor or sensory deficits in extremities. Climbs on and off stretcher easily by herself. Gait steady..; Skin: Color normal, Warm, Dry    ED Treatments / Results  Labs (all labs ordered are listed, but only abnormal results are displayed)   EKG None  Radiology   Procedures Procedures (including critical care time)  Medications Ordered in ED Medications  acetaminophen (TYLENOL) tablet 650 mg (has no administration in time range)  cloNIDine (CATAPRES) tablet 0.1 mg (has no administration in time range)    Followed by  cloNIDine (CATAPRES) tablet 0.1 mg (has no administration in time range)    Followed by  cloNIDine (CATAPRES) tablet 0.1 mg (has no administration in time range)    dicyclomine (BENTYL) tablet 20 mg (has no administration in time range)  hydrOXYzine (ATARAX/VISTARIL) tablet 25 mg (has no administration in time range)  loperamide (IMODIUM) capsule 2-4 mg (has no administration in time range)  methocarbamol (ROBAXIN) tablet 500 mg (has no administration in time range)  ondansetron (ZOFRAN-ODT) disintegrating tablet 4 mg (has no administration in time  range)  acetaminophen (TYLENOL) tablet 650 mg (has no administration in time range)  nicotine (NICODERM CQ - dosed in mg/24 hours) patch 21 mg (has no administration in time range)  fosfomycin (MONUROL) packet 3 g (has no administration in time range)     Initial Impression / Assessment and Plan / ED Course  I have reviewed the triage vital signs and the nursing notes.  Pertinent labs & imaging results that were available during my care of the patient were reviewed by me and considered in my medical decision making (see chart for details).  MDM Reviewed: previous chart, nursing note and vitals Reviewed previous: labs Interpretation: labs, x-ray and CT scan   Results for orders placed or performed during the hospital encounter of 12/01/17  Basic metabolic panel  Result Value Ref Range   Sodium 136 135 - 145 mmol/L   Potassium 3.6 3.5 - 5.1 mmol/L   Chloride 96 (L) 98 - 111 mmol/L   CO2 28 22 - 32 mmol/L   Glucose, Bld 115 (H) 70 - 99 mg/dL   BUN 8 6 - 20 mg/dL   Creatinine, Ser 1.61 0.44 - 1.00 mg/dL   Calcium 9.5 8.9 - 09.6 mg/dL   GFR calc non Af Amer >60 >60 mL/min   GFR calc Af Amer >60 >60 mL/min   Anion gap 12 5 - 15  CBC with Differential  Result Value Ref Range   WBC 12.9 (H) 4.0 - 10.5 K/uL   RBC 4.90 3.87 - 5.11 MIL/uL   Hemoglobin 15.0 12.0 - 15.0 g/dL   HCT 04.5 40.9 - 81.1 %   MCV 89.8 78.0 - 100.0 fL   MCH 30.6 26.0 - 34.0 pg   MCHC 34.1 30.0 - 36.0 g/dL   RDW 91.4 78.2 - 95.6 %   Platelets 300 150 - 400 K/uL   Neutrophils Relative % 70 %   Neutro Abs 9.0 (H) 1.7 - 7.7  K/uL   Lymphocytes Relative 25 %   Lymphs Abs 3.2 0.7 - 4.0 K/uL   Monocytes Relative 5 %   Monocytes Absolute 0.7 0.1 - 1.0 K/uL   Eosinophils Relative 0 %   Eosinophils Absolute 0.0 0.0 - 0.7 K/uL   Basophils Relative 0 %   Basophils Absolute 0.0 0.0 - 0.1 K/uL  Pregnancy, urine  Result Value Ref Range   Preg Test, Ur NEGATIVE NEGATIVE  Urine rapid drug screen (hosp performed)  Result Value Ref Range   Opiates NONE DETECTED NONE DETECTED   Cocaine NONE DETECTED NONE DETECTED   Benzodiazepines POSITIVE (A) NONE DETECTED   Amphetamines NONE DETECTED NONE DETECTED   Tetrahydrocannabinol POSITIVE (A) NONE DETECTED   Barbiturates (A) NONE DETECTED    Result not available. Reagent lot number recalled by manufacturer.  Urinalysis, Routine w reflex microscopic  Result Value Ref Range   Color, Urine AMBER (A) YELLOW   APPearance HAZY (A) CLEAR   Specific Gravity, Urine 1.030 1.005 - 1.030   pH 6.0 5.0 - 8.0   Glucose, UA NEGATIVE NEGATIVE mg/dL   Hgb urine dipstick NEGATIVE NEGATIVE   Bilirubin Urine MODERATE (A) NEGATIVE   Ketones, ur NEGATIVE NEGATIVE mg/dL   Protein, ur 213 (A) NEGATIVE mg/dL   Nitrite NEGATIVE NEGATIVE   Leukocytes, UA MODERATE (A) NEGATIVE   RBC / HPF 6-10 0 - 5 RBC/hpf   WBC, UA 21-50 0 - 5 WBC/hpf   Bacteria, UA RARE (A) NONE SEEN   Squamous Epithelial / LPF 11-20 0 - 5  Mucus PRESENT   Acetaminophen level  Result Value Ref Range   Acetaminophen (Tylenol), Serum <10 (L) 10 - 30 ug/mL  Salicylate level  Result Value Ref Range   Salicylate Lvl <7.0 2.8 - 30.0 mg/dL  Carbamazepine level, total  Result Value Ref Range   Carbamazepine Lvl 12.3 (H) 4.0 - 12.0 ug/mL  Hepatic function panel  Result Value Ref Range   Total Protein 8.8 (H) 6.5 - 8.1 g/dL   Albumin 4.6 3.5 - 5.0 g/dL   AST 63 (H) 15 - 41 U/L   ALT 140 (H) 0 - 44 U/L   Alkaline Phosphatase 151 (H) 38 - 126 U/L   Total Bilirubin 1.0 0.3 - 1.2 mg/dL   Bilirubin, Direct 0.2 0.0 - 0.2 mg/dL     Indirect Bilirubin 0.8 0.3 - 0.9 mg/dL  Ethanol  Result Value Ref Range   Alcohol, Ethyl (B) <10 <10 mg/dL   Dg Chest 2 View Result Date: 12/01/2017 CLINICAL DATA:  Drug overdose.  Polysubstance abuse. EXAM: CHEST - 2 VIEW COMPARISON:  08/07/2016 FINDINGS: The heart size and mediastinal contours are within normal limits. Both lungs are clear. The visualized skeletal structures are unremarkable. IMPRESSION: No active cardiopulmonary disease. Electronically Signed   By: Myles Rosenthal M.D.   On: 12/01/2017 15:01   Ct Head Wo Contrast Result Date: 12/01/2017 CLINICAL DATA:  Head trauma. Heroin withdrawal. History of polysubstance abuse. EXAM: CT HEAD WITHOUT CONTRAST CT MAXILLOFACIAL WITHOUT CONTRAST CT CERVICAL SPINE WITHOUT CONTRAST TECHNIQUE: Multidetector CT imaging of the head, cervical spine, and maxillofacial structures were performed using the standard protocol without intravenous contrast. Multiplanar CT image reconstructions of the cervical spine and maxillofacial structures were also generated. COMPARISON:  CT face Oct 05, 2017 and CT HEAD May 08, 2017. FINDINGS: CT HEAD FINDINGS BRAIN: The ventricles and sulci are normal. No intraparenchymal hemorrhage, mass effect nor midline shift. No acute large vascular territory infarcts. No abnormal extra-axial fluid collections. Basal cisterns are patent. VASCULAR: Unremarkable. SKULL/SOFT TISSUES: No skull fracture. No significant soft tissue swelling. OTHER: None. CT MAXILLOFACIAL FINDINGS OSSEOUS: The mandible is intact, the condyles are located. No acute facial fracture. Nondisplaced fracture LEFT anterior maxillary wall extending to the nasal process of the maxilla in improved alignment. No destructive bony lesions. Multiple dental caries and periapical abscess. ORBITS: Ocular globes and orbital contents are normal. SINUSES: Paranasal sinuses are well aerated. Nasal septum is midline. Included mastoid air cells are well aerated. SOFT TISSUES: No  significant soft tissue swelling. No subcutaneous gas or radiopaque foreign bodies. CT CERVICAL SPINE FINDINGS ALIGNMENT: Cervical vertebral bodies in alignment. Broad reversed cervical lordosis. SKULL BASE AND VERTEBRAE: Cervical vertebral bodies and posterior elements are intact. Small RIGHT C7 rib. Elongated LEFT C7 transverse process. Intervertebral disc heights preserved. No destructive bony lesions. C1-2 articulation maintained. SOFT TISSUES AND SPINAL CANAL: Included prevertebral and paraspinal soft tissues are normal. DISC LEVELS: No significant osseous canal stenosis or neural foraminal narrowing. UPPER CHEST: Lung apices are clear. OTHER: None. IMPRESSION: CT HEAD: 1. Negative noncontrast CT HEAD. CT MAXILLOFACIAL: 1. No acute facial fracture. 2. Nondisplaced subacute LEFT maxillary fractures. 3. Poor dentition. CT CERVICAL SPINE: 1. Negative noncontrast CT cervical spine. Electronically Signed   By: Awilda Metro M.D.   On: 12/01/2017 14:34   Ct Cervical Spine Wo Contrast Result Date: 12/01/2017 CLINICAL DATA:  Head trauma. Heroin withdrawal. History of polysubstance abuse. EXAM: CT HEAD WITHOUT CONTRAST CT MAXILLOFACIAL WITHOUT CONTRAST CT CERVICAL SPINE WITHOUT CONTRAST TECHNIQUE: Multidetector CT imaging of  the head, cervical spine, and maxillofacial structures were performed using the standard protocol without intravenous contrast. Multiplanar CT image reconstructions of the cervical spine and maxillofacial structures were also generated. COMPARISON:  CT face Oct 05, 2017 and CT HEAD May 08, 2017. FINDINGS: CT HEAD FINDINGS BRAIN: The ventricles and sulci are normal. No intraparenchymal hemorrhage, mass effect nor midline shift. No acute large vascular territory infarcts. No abnormal extra-axial fluid collections. Basal cisterns are patent. VASCULAR: Unremarkable. SKULL/SOFT TISSUES: No skull fracture. No significant soft tissue swelling. OTHER: None. CT MAXILLOFACIAL FINDINGS OSSEOUS: The  mandible is intact, the condyles are located. No acute facial fracture. Nondisplaced fracture LEFT anterior maxillary wall extending to the nasal process of the maxilla in improved alignment. No destructive bony lesions. Multiple dental caries and periapical abscess. ORBITS: Ocular globes and orbital contents are normal. SINUSES: Paranasal sinuses are well aerated. Nasal septum is midline. Included mastoid air cells are well aerated. SOFT TISSUES: No significant soft tissue swelling. No subcutaneous gas or radiopaque foreign bodies. CT CERVICAL SPINE FINDINGS ALIGNMENT: Cervical vertebral bodies in alignment. Broad reversed cervical lordosis. SKULL BASE AND VERTEBRAE: Cervical vertebral bodies and posterior elements are intact. Small RIGHT C7 rib. Elongated LEFT C7 transverse process. Intervertebral disc heights preserved. No destructive bony lesions. C1-2 articulation maintained. SOFT TISSUES AND SPINAL CANAL: Included prevertebral and paraspinal soft tissues are normal. DISC LEVELS: No significant osseous canal stenosis or neural foraminal narrowing. UPPER CHEST: Lung apices are clear. OTHER: None. IMPRESSION: CT HEAD: 1. Negative noncontrast CT HEAD. CT MAXILLOFACIAL: 1. No acute facial fracture. 2. Nondisplaced subacute LEFT maxillary fractures. 3. Poor dentition. CT CERVICAL SPINE: 1. Negative noncontrast CT cervical spine. Electronically Signed   By: Awilda Metro M.D.   On: 12/01/2017 14:34   Ct Maxillofacial Wo Cm Result Date: 12/01/2017 CLINICAL DATA:  Head trauma. Heroin withdrawal. History of polysubstance abuse. EXAM: CT HEAD WITHOUT CONTRAST CT MAXILLOFACIAL WITHOUT CONTRAST CT CERVICAL SPINE WITHOUT CONTRAST TECHNIQUE: Multidetector CT imaging of the head, cervical spine, and maxillofacial structures were performed using the standard protocol without intravenous contrast. Multiplanar CT image reconstructions of the cervical spine and maxillofacial structures were also generated. COMPARISON:  CT  face Oct 05, 2017 and CT HEAD May 08, 2017. FINDINGS: CT HEAD FINDINGS BRAIN: The ventricles and sulci are normal. No intraparenchymal hemorrhage, mass effect nor midline shift. No acute large vascular territory infarcts. No abnormal extra-axial fluid collections. Basal cisterns are patent. VASCULAR: Unremarkable. SKULL/SOFT TISSUES: No skull fracture. No significant soft tissue swelling. OTHER: None. CT MAXILLOFACIAL FINDINGS OSSEOUS: The mandible is intact, the condyles are located. No acute facial fracture. Nondisplaced fracture LEFT anterior maxillary wall extending to the nasal process of the maxilla in improved alignment. No destructive bony lesions. Multiple dental caries and periapical abscess. ORBITS: Ocular globes and orbital contents are normal. SINUSES: Paranasal sinuses are well aerated. Nasal septum is midline. Included mastoid air cells are well aerated. SOFT TISSUES: No significant soft tissue swelling. No subcutaneous gas or radiopaque foreign bodies. CT CERVICAL SPINE FINDINGS ALIGNMENT: Cervical vertebral bodies in alignment. Broad reversed cervical lordosis. SKULL BASE AND VERTEBRAE: Cervical vertebral bodies and posterior elements are intact. Small RIGHT C7 rib. Elongated LEFT C7 transverse process. Intervertebral disc heights preserved. No destructive bony lesions. C1-2 articulation maintained. SOFT TISSUES AND SPINAL CANAL: Included prevertebral and paraspinal soft tissues are normal. DISC LEVELS: No significant osseous canal stenosis or neural foraminal narrowing. UPPER CHEST: Lung apices are clear. OTHER: None. IMPRESSION: CT HEAD: 1. Negative noncontrast CT HEAD. CT MAXILLOFACIAL:  1. No acute facial fracture. 2. Nondisplaced subacute LEFT maxillary fractures. 3. Poor dentition. CT CERVICAL SPINE: 1. Negative noncontrast CT cervical spine. Electronically Signed   By: Awilda Metro M.D.   On: 12/01/2017 14:34    1600:  Workup reassuring. Will dose/fully tx for cystitis while in  the ED. TTS pending evaluation. Holding orders written.      Final Clinical Impressions(s) / ED Diagnoses     ED Discharge Orders    None       [name], DO 12/01/17 1612

## 2017-12-01 NOTE — ED Notes (Signed)
Pt transported to radiology.

## 2017-12-01 NOTE — ED Provider Notes (Signed)
Pt was brought in by Police under emergency IVC for SA/SI. TTS has evaluated pt: [facility] Will transfer stable.    Samuel JesterMcManus, Cristalle Rohm, DO 12/01/17 1637

## 2017-12-02 ENCOUNTER — Encounter (HOSPITAL_COMMUNITY): Payer: Self-pay

## 2017-12-02 MED ORDER — DIPHENHYDRAMINE HCL 50 MG/ML IJ SOLN
50.0000 mg | Freq: Once | INTRAMUSCULAR | Status: AC
  Administered 2017-12-02: 50 mg via INTRAMUSCULAR
  Filled 2017-12-02: qty 1

## 2017-12-02 MED ORDER — BENZTROPINE MESYLATE 0.5 MG PO TABS
0.5000 mg | ORAL_TABLET | Freq: Two times a day (BID) | ORAL | Status: DC
Start: 2017-12-02 — End: 2017-12-05
  Administered 2017-12-02 – 2017-12-05 (×5): 0.5 mg via ORAL
  Filled 2017-12-02 (×11): qty 1

## 2017-12-02 MED ORDER — OLANZAPINE 7.5 MG PO TABS
7.5000 mg | ORAL_TABLET | Freq: Every day | ORAL | Status: DC
  Administered 2017-12-02 – 2017-12-03 (×2): 7.5 mg via ORAL
  Filled 2017-12-02 (×3): qty 1
  Filled 2017-12-02: qty 3

## 2017-12-02 MED ORDER — HALOPERIDOL 5 MG PO TABS
5.0000 mg | ORAL_TABLET | Freq: Three times a day (TID) | ORAL | Status: DC
  Administered 2017-12-02: 5 mg via ORAL
  Filled 2017-12-02 (×7): qty 1

## 2017-12-02 MED ORDER — HALOPERIDOL 5 MG PO TABS
5.0000 mg | ORAL_TABLET | Freq: Two times a day (BID) | ORAL | Status: DC
  Administered 2017-12-02: 5 mg via ORAL
  Filled 2017-12-02 (×2): qty 1

## 2017-12-02 MED ORDER — BENZTROPINE MESYLATE 0.5 MG PO TABS
ORAL_TABLET | ORAL | Status: AC
  Administered 2017-12-02: 0.5 mg via ORAL
  Filled 2017-12-02: qty 1

## 2017-12-02 MED ORDER — DIPHENHYDRAMINE HCL 50 MG/ML IJ SOLN
INTRAMUSCULAR | Status: AC
  Filled 2017-12-02: qty 1

## 2017-12-02 MED ORDER — DIPHENHYDRAMINE HCL 25 MG PO CAPS
25.0000 mg | ORAL_CAPSULE | Freq: Once | ORAL | Status: AC
  Administered 2017-12-02: 25 mg via ORAL
  Filled 2017-12-02 (×2): qty 1

## 2017-12-02 MED ORDER — CEPHALEXIN 500 MG PO CAPS
500.0000 mg | ORAL_CAPSULE | Freq: Three times a day (TID) | ORAL | Status: DC
  Administered 2017-12-02 – 2017-12-08 (×19): 500 mg via ORAL
  Filled 2017-12-02 (×4): qty 1
  Filled 2017-12-02: qty 9
  Filled 2017-12-02 (×8): qty 1
  Filled 2017-12-02: qty 9
  Filled 2017-12-02: qty 2
  Filled 2017-12-02 (×2): qty 1
  Filled 2017-12-02 (×2): qty 9
  Filled 2017-12-02 (×3): qty 1
  Filled 2017-12-02: qty 9
  Filled 2017-12-02 (×2): qty 1
  Filled 2017-12-02: qty 9
  Filled 2017-12-02 (×3): qty 1

## 2017-12-02 MED ORDER — DIPHENHYDRAMINE HCL 50 MG/ML IJ SOLN
50.0000 mg | Freq: Once | INTRAMUSCULAR | Status: DC
  Administered 2017-12-03: 50 mg via INTRAVENOUS
  Filled 2017-12-02: qty 1

## 2017-12-02 NOTE — [facility] Group Notes (Signed)
  [facility] LCSW Group Therapy Note  Date/Time:  12/02/2017 11:15AM-12:00PM  Type of Therapy and Topic:  Group Therapy:  Feelings About Hospitalization  Participation Level:  Did Not Attend   Description of Group This process group involved patients discussing their feelings related to being hospitalized, as well as the benefits they see to being in the hospital.  These feelings and benefits were itemized.  The group then brainstormed specific ways in which they could seek those same benefits when they discharge and return home.  Therapeutic Goals 1. Patient will identify and describe positive and negative feelings related to hospitalization 2. Patient will verbalize benefits of hospitalization to themselves personally 3. Patients will brainstorm together ways they can obtain similar benefits in the outpatient setting, identify barriers to wellness and possible solutions  Summary of Patient Progress:  N/A  Therapeutic Modalities Cognitive Behavioral Therapy Motivational Interviewing    Ambrose MantleMareida Grossman-Orr, LCSW 12/02/2017, 8:39 AM   l

## 2017-12-02 NOTE — Progress Notes (Signed)
Did not attend group 

## 2017-12-02 NOTE — Progress Notes (Signed)
Pt complained of EPS-like symptoms upon shift assessment.  Her jaw is clenched and she talks through clenched teeth.  She complains of pain from jaw muscle tightening.  On-call provider, Dr. Jola Babinskilary, notified and new orders placed.  Benadryl 50 mg IMX1 was ordered and administered.  Medication administered per order.  EPS-like symptoms are now resolved and her jaw is no longer clenched.  Pt is able to talk normally.  She was compliant with medication administration.  Will continue to monitor and assess.

## 2017-12-02 NOTE — Progress Notes (Signed)
Becky [name] is a 27 year old female being admitted involuntarily to [facility] from [facility].  She came to the ED under IVC after OD attempt on 10-12 pills of tegretol, hearing voices and using substances.  [facility], she was pleasant and cooperative.  She denied current suicidal ideation and will contract for safety on the unit.  She denied HI or A/V hallucinations.  She reported her stressors were losing her job, no money, not taking her psychiatric medications and living with her mother/aunt.  She has history of multiple hospitalization and three previous suicide attempts in the past.  Oriented her to the unit.  Admission paperwork completed and signed.  Belongings searched and no belongings needing to be locked up, no contraband found.  Skin assessment completed and noted bruising to her chin.  Q 15 minute checks initiated for safety.  We will continue to monitor the progress towards her goals.
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
## 2017-12-02 NOTE — Tx Team (Signed)
Initial Treatment Plan 12/02/2017 1:31 AM Ambrose Pancoastanielle Peaden ZOX:[id number]    PATIENT STRESSORS: Financial difficulties Marital or family conflict Medication change or noncompliance Occupational concerns Substance abuse   PATIENT STRENGTHS: Wellsite geologistCommunication skills General fund of knowledge Physical Health   PATIENT IDENTIFIED PROBLEMS: Depression  Substance abuse  Suicidal ideation  "Get on my meds"  "Get a better frame of mind"             DISCHARGE CRITERIA:  Improved stabilization in mood, thinking, and/or behavior Verbal commitment to aftercare and medication compliance Withdrawal symptoms are absent or subacute and managed without 24-hour nursing intervention  PRELIMINARY DISCHARGE PLAN: Outpatient therapy Medication management  PATIENT/FAMILY INVOLVEMENT: This treatment plan has been presented to and reviewed with the patient, Ambrose PancoastDanielle Mallozzi.  The patient and family have been given the opportunity to ask questions and make suggestions.  Levin BaconHeather V Slayter Moorhouse, RN 12/02/2017, 1:31 AM

## 2017-12-02 NOTE — [facility] Counselor (Signed)
Clinical Social Work Note  CSW attempted to meet with patient to do Psychosocial Assessment and she declined, asking to do it tomorrow.  When told it could be done in her room without her getting out of bed, she became irritable and insisted she could not do it until tomorrow.  CSW will follow up.  Ambrose MantleMareida Grossman-Orr, LCSW 12/02/2017, 4:26 PM

## 2017-12-02 NOTE — [facility] Suicide Risk Assessment (Signed)
[facility]   Nursing information obtained from:  Patient Demographic factors:  Caucasian, Unemployed Current Mental Status:  NA Loss Factors:  Decrease in vocational status, Financial problems / change in socioeconomic status Historical Factors:  Prior suicide attempts Risk Reduction Factors:  Living with another person, especially a relative  Total Time spent with patient: 30 minutes Principal Problem: <principal problem not specified> Diagnosis:   Patient Active Problem List   Diagnosis Date Noted  . Schizoaffective disorder (HCC) [F25.9] 12/01/2017  . Alcohol abuse [F10.10] 05/26/2017  . Schizoaffective disorder, bipolar type (HCC) [F25.0] 05/26/2017  . Polysubstance abuse (HCC) [F19.10]   . Altered mental state [R41.82] 05/08/2017  . Hallucinations [R44.3]   . Bipolar disorder, curr episode mixed, severe, with psychotic features (HCC) [F31.64] 11/02/2016  . PTSD (post-traumatic stress disorder) [F43.10] 11/02/2016  . GAD (generalized anxiety disorder) [F41.1] 10/16/2016  . Opioid use disorder, moderate, dependence (HCC) [F11.20] 06/06/2016  . Sedative, hypnotic or anxiolytic use disorder, severe, dependence (HCC) [F13.20] 06/06/2016  . Cocaine use disorder, severe, dependence (HCC) [F14.20] 06/06/2016  . Cannabis use disorder, severe, dependence (HCC) [F12.20] 06/06/2016  . MDD (major depressive disorder), recurrent severe, without psychosis (HCC) [F33.2] 06/03/2016  . Bipolar I disorder, most recent episode depressed (HCC) [F31.30] 07/04/2015  . Overdose [T50.901A] 07/02/2015  . Lactic acidosis [E87.2] 07/02/2015  . Acute respiratory failure (HCC) [J96.00] 07/02/2015  . Sepsis (HCC) [A41.9] 07/02/2015  . AKI (acute kidney injury) (HCC) [N17.9]   . Altered mental status [R41.82]   . Pyrexia [R50.9]   . Abdominal pain, epigastric [R10.13] 06/17/2015  . Hepatitis C antibody test positive [R76.8] 06/17/2015  . Peptic ulcer disease [K27.9] 06/17/2015   . Epigastric pain [R10.13]   . Transaminitis [R74.0] 06/16/2015  . History of substance abuse [Z87.898] 06/16/2015   Subjective Data: Patient is seen and examined.  Patient is a 27 year old female with a reported history of bipolar disorder, schizoaffective disorder, generalized anxiety disorder, history of substance abuse, hypothyroidism who presented to the [facility] emergency department on 7/19 after reported intentional overdose of Tegretol.  The patient is a poor historian, and really cannot provide a great deal of history at this point.  According to the notes from the emergency room the patient had been brought to the emergency department by police for an intentional overdose on Tegretol.  She also had used heroin 2 days prior to admission.  She stated that she took 12 Tegretol in a suicide attempt the day prior to admission.  She reportedly stated that she thought she had lost consciousness because her face and chest were sore and a new bruise was on her chin.  The patient endorsed suicidal attempt in the emergency department and stated "I feel like I am starting withdrawal from heroin".  In the emergency department her drug screen was positive for benzodiazepines and THC, but negative for opiates.  Her Tegretol level was 12.3.  Her desire on admission was to be restarted on her medications.  She stated this a.m. that she was taking Haldol and Zyprexa.  She stated that she was unsure of the dosage.  She stated she had stopped her medicines after her most recent hospitalization at [facility] 3 weeks ago.  She stated she did not get her medications after discharge.  She had also been admitted to [facility] in May, and had been treated with Cogentin, clonidine, Haldol, hydroxyzine, loperamide, mirtazapine, ondansetron, Seroquel, Zoloft on admission, but these were stopped.  She was discharged on  fluoxetine, Robaxin, prazosin, and was continued on Tegretol, dicyclomine, Neurontin, levothyroxine, and  propranolol.  I am not sure where the Haldol and Zyprexa came from except if it was from here.  Again the patient is not very cooperative this morning and very irritable.  We will have to contact her pharmacy for a more clear idea of what medications that she was discharged on at [facility].  She was admitted to the hospital for evaluation and stabilization.  Continued Clinical Symptoms:  Alcohol Use Disorder Identification Test Final Score (AUDIT): 2 The "Alcohol Use Disorders Identification Test", Guidelines for Use in Primary Care, Second Edition.  World Science writer [facility]). Score between 0-7:  no or low risk or alcohol related problems. Score between 8-15:  moderate risk of alcohol related problems. Score between 16-19:  high risk of alcohol related problems. Score 20 or above:  warrants further diagnostic evaluation for alcohol dependence and treatment.   CLINICAL FACTORS:   Bipolar Disorder:   Depressive phase Alcohol/Substance Abuse/Dependencies Schizophrenia:   Less than 82 years old   Musculoskeletal: Strength & Muscle Tone: within normal limits Gait & Station: normal Patient leans: N/A  Psychiatric Specialty Exam: Physical Exam  Nursing note and vitals reviewed. Constitutional: She is oriented to person, place, and time. She appears well-developed and well-nourished.  HENT:  Head: Normocephalic and atraumatic.  Respiratory: Effort normal.  Neurological: She is alert and oriented to person, place, and time.    ROS  Blood pressure 108/71, pulse 97, temperature 98.9 F (37.2 C), temperature source Oral, resp. rate 18, height 5\' 2"  (1.575 m), weight 80.3 kg (177 lb).Body mass index is 32.37 kg/m.  General Appearance: Disheveled  Eye Contact:  Poor  Speech:  Normal Rate  Volume:  Increased  Mood:  Anxious, Dysphoric and Irritable  Affect:  Congruent  Thought Process:  Coherent  Orientation:  Full (Time, Place, and Person)  Thought Content:  Negative  Suicidal  Thoughts:  Yes.  without intent/plan  Homicidal Thoughts:  No  Memory:  Immediate;   Poor Recent;   Poor Remote;   Poor  Judgement:  Impaired  Insight:  Lacking  Psychomotor Activity:  Psychomotor Retardation  Concentration:  Concentration: Poor and Attention Span: Poor  Recall:  Poor  Fund of Knowledge:  Fair  Language:  Fair  Akathisia:  Negative  Handed:  Right  AIMS (if indicated):     Assets:  Desire for Improvement Physical Health  ADL's:  Intact  Cognition:  WNL  Sleep:  Number of Hours: 6.5      COGNITIVE FEATURES THAT CONTRIBUTE TO RISK:  Thought constriction (tunnel vision)    SUICIDE RISK:   Moderate:  Frequent suicidal ideation with limited intensity, and duration, some specificity in terms of plans, no associated intent, good self-control, limited dysphoria/symptomatology, some risk factors present, and identifiable protective factors, including available and accessible social support.  PLAN OF CARE: Patient is seen and examined.  Patient is a 27 year old female with the above-stated past psychiatric history who was admitted after reported intentional overdose of Tegretol.  She is very irritable and not able to provide a great deal of history at this point.  I have checked the controlled substances database and she had not received any Suboxone, Subutex or controlled substances since 2018.  Her drug screen was positive for benzodiazepines as well as THC.  Her Tegretol level was 12.3.  She does appear to have urinary tract infection and apparently was given Keflex in the emergency department, but  that has not been continued.  She stated she thought she was taking Haldol and Zyprexa, but at least the old notes from [facility] showed that she was last on Haldol alone with other medications.  This did include Tegretol at that point.  We will try and get a hold of her old records from [facility], and as well contact her most recent pharmacy to find out what medication she had  been taking.  I am going to restart the Keflex for urinary tract infection, and put haloperidol 5 mg p.o. 3 times daily as needed for now.  Hopefully we can get some clarification on these issues rapidly so we can get her medication issues straightened out.  She will be admitted to the hospital.  She will be integrated into the milieu.  She will meet with social work both individually and in groups.  We will work on her substance abuse issues.  We will try and work with her on coping skills.  We will attempt to get additional social history once she is less irritable.  I certify that inpatient services furnished can reasonably be expected to improve the patient's condition.   Antonieta Pert, MD 12/02/2017, 8:36 AM

## 2017-12-02 NOTE — Progress Notes (Signed)
D. Pt presents with a sullen affect and anxious/depressed mood and calm, cooperative behavior. Pt isolative to room for much of the day- complaining of stomach cramps. Pt's mood improving after taking a shower  Pt currently denies SI/HI - does not appear to be responding to internal stimuli.  A. Labs and vitals monitored. Pt compliant with medications. Pt supported emotionally and encouraged to express concerns and ask questions.   R. Pt remains safe with 15 minute checks. Will continue POC.

## 2017-12-02 NOTE — Progress Notes (Signed)
Met with pt 1:1.  Pt denies SI/HI and hallucinations.  She verbally contracts for safety and reports she will inform staff of needs and concerns.  Medication administered per order and pt is compliant with medications.  PRN medication administered for sleep.  Will continue to monitor and assess.  

## 2017-12-02 NOTE — Progress Notes (Signed)
Pt complaining of 'clenching her teeth' and 'feeling restless'. NP notified. Administered cogentin per order. Will continue to monitor

## 2017-12-03 DIAGNOSIS — F1520 Other stimulant dependence, uncomplicated: Secondary | ICD-10-CM

## 2017-12-03 DIAGNOSIS — F1120 Opioid dependence, uncomplicated: Secondary | ICD-10-CM

## 2017-12-03 DIAGNOSIS — R450 Nervousness: Secondary | ICD-10-CM

## 2017-12-03 DIAGNOSIS — F1210 Cannabis abuse, uncomplicated: Secondary | ICD-10-CM

## 2017-12-03 DIAGNOSIS — F1410 Cocaine abuse, uncomplicated: Secondary | ICD-10-CM

## 2017-12-03 DIAGNOSIS — F259 Schizoaffective disorder, unspecified: Principal | ICD-10-CM

## 2017-12-03 LAB — TSH: TSH: 1.028 u[IU]/mL (ref 0.350–4.500)

## 2017-12-03 MED ORDER — DIPHENHYDRAMINE HCL 50 MG/ML IJ SOLN
INTRAMUSCULAR | Status: AC
  Administered 2017-12-03: 50 mg via INTRAVENOUS
  Filled 2017-12-03: qty 1

## 2017-12-03 MED ORDER — HYDROXYZINE HCL 50 MG PO TABS
ORAL_TABLET | ORAL | Status: AC
  Filled 2017-12-03: qty 1

## 2017-12-03 MED ORDER — DIPHENHYDRAMINE HCL 50 MG/ML IJ SOLN: 50.0000 mg | Freq: Once | INTRAMUSCULAR | Status: DC

## 2017-12-03 MED ORDER — HYDROXYZINE HCL 50 MG PO TABS
50.0000 mg | ORAL_TABLET | Freq: Three times a day (TID) | ORAL | Status: DC | PRN
Start: 2017-12-03 — End: 2017-12-08
  Administered 2017-12-03 – 2017-12-07 (×5): 50 mg via ORAL
  Filled 2017-12-03: qty 1
  Filled 2017-12-03: qty 10
  Filled 2017-12-03 (×3): qty 1

## 2017-12-03 NOTE — [facility] Group Notes (Signed)
Patient did not attend orientation and goals group.  

## 2017-12-03 NOTE — [facility] Group Notes (Signed)
[facility] Group Notes: (Clinical Social Work)   12/03/2017      Type of Therapy:  Group Therapy   Participation Level:  Did Not Attend despite [facility]HT prompting   Ambrose MantleMareida Grossman-Orr, LCSW 12/03/2017, 1:42 PM

## 2017-12-03 NOTE — Plan of Care (Signed)
  Problem: Safety: Goal: Periods of time without injury will increase Outcome: Progressing Note:  Pt has not harmed self or others tonight.  She denies SI/HI and verbally contracts for safety.    

## 2017-12-03 NOTE — Progress Notes (Signed)
D: Patient approached RN acting with torticollis, oculogyric crisis and twisting abdomen. Negative for cogwheel rigidity. Patient's eyes easily move towards midline at patient's will. Arms relaxed intermittently at sides. Patient refusing to speak and holding jaw tight.  A: Provider Wynetta EmeryG. Clary, MD notified and ordered benadryl. Administered IM Benadryl 50mg  to rule out acute dystonic reaction.  R: Patient agrees to rest in bed.

## 2017-12-03 NOTE — Plan of Care (Signed)
D: Patient presents lethargic, irritable, depressed. She is sleeping in late this morning, and demonstrating little interest in participating in assessment. She reports that she slept well last night. She currently denies SI/HI/AVH. A: Patient checked q15 min, and checks reviewed. Reviewed medication changes with patient and educated on side effects. Educated patient on importance of attending group therapy sessions and educated on several coping skills. Encouarged participation in milieu through recreation therapy and attending meals with peers. Support and encouragement provided. Fluids offered. R: Patient receptive to education on medications, and is medication compliant. Patient contracts for safety on the unit. She did not fill out a self inventory sheet.

## 2017-12-03 NOTE — [facility] Group Notes (Signed)
[facility] Group Notes:  (Nursing)  Date:  12/03/2017  Time: 1400 Type of Therapy:  Nurse Education  Participation Level:  Did Not Attend   Shela NevinValerie S Rosalin Buster 12/03/2017, 3:29 PM

## 2017-12-03 NOTE — [facility] Counselor (Signed)
Clinical Social Work Note  Attempted to do Psychosocial Assessment, and patient refused once more.  When importance of doing this was explained to her, she became very irritable and said, "can we please wait until I feel better?"  CSW department to continue to follow up.  Becky MantleMareida Grossman-Orr, LCSW 12/03/2017, 1:01 PM

## 2017-12-03 NOTE — Progress Notes (Addendum)
[facility] MD Progress Note  12/03/2017 9:27 AM Becky [name]  MRN:  [id number]   Subjective:  Becky [name] seen resting in bed.  Patient presents irritable and guarded,  and agitated during this assessment.  Reports "everything is fine"  with short abrupt responses. Becky [name] is blunted, flat and depressed.   Denies homicidal suicidal ideations.  Denies auditory or visual hallucinations.  Was reported on yesterday patient was experiencing jaw pain with medication.  Evaluated patient denied any pain or discomfort today.  Rates her depression 8 out of 10 with 10 being the worst states she does need to get some rest denies attending group session.  Support encouragement reassurance was provided.  History:Harrelsonis an 27 y.o.female.The pt came in under IVC after taking 10-12 Tegretol pills in a suicide attempt. The pt is vague about stating her major stressors. When asked about her stressors she stated "life". She later stated she is having financial problems. The pt was discharged from [facility] three weeks ago for SI. She stated she wants to get back on medication. She reported she had a prescription after leaving [facility], but did not have the money to get the prescription filled. The pt stated she has not been to [facility] or any other out patient treatment for about 3 years. The pt has had several hospitalizations in the past.     Principal Problem: Schizoaffective disorder ([place]) Diagnosis:   Patient Active Problem List   Diagnosis Date Noted  . Schizoaffective disorder ([place]) [F25.9] 12/01/2017  . Alcohol abuse [F10.10] 05/26/2017  . Schizoaffective disorder, bipolar type ([place]) [F25.0] 05/26/2017  . Polysubstance abuse ([place]) [F19.10]   . Altered mental state [R41.82] 05/08/2017  . Hallucinations [R44.3]   . Bipolar disorder, curr episode mixed, severe, with psychotic features ([place]) [F31.64] 11/02/2016  . PTSD (post-traumatic stress disorder) [F43.10] 11/02/2016  . GAD (generalized  anxiety disorder) [F41.1] 10/16/2016  . Opioid use disorder, moderate, dependence ([place]) [F11.20] 06/06/2016  . 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 Cocaine use disorder, severe, dependence ([place]) [F14.20] 06/06/2016  . Cannabis use disorder, severe, dependence ([place]) [F12.20] 06/06/2016  . MDD (major depressive disorder), recurrent severe, without psychosis ([place]) [F33.2] 06/03/2016  . Bipolar I disorder, most recent episode depressed ([place]) [F31.30] 07/04/2015  . Overdose [T50.901A] 07/02/2015  . Lactic acidosis [E87.2] 07/02/2015  . Acute respiratory failure ([place]) [J96.00] 07/02/2015  . Sepsis ([place]) [A41.9] 07/02/2015  . AKI (acute kidney injury) ([place]) [N17.9]   . Altered mental status [R41.82]   . Pyrexia [R50.9]   . Abdominal pain, epigastric [R10.13] 06/17/2015  . Hepatitis C antibody test positive [R76.8] 06/17/2015  . Peptic ulcer disease [K27.9] 06/17/2015  . Epigastric pain [R10.13]   . Transaminitis [R74.0] 06/16/2015  . History of substance abuse [Z87.898] 06/16/2015   Total Time spent with patient: 20 minutes  Past Psychiatric History:   Past Medical History:  Past Medical History:  Diagnosis Date  . Bipolar 1 disorder ([place])   . Bronchitis   . GAD (generalized anxiety disorder)   . Hepatitis C antibody test positive 06/17/2015  . History of substance abuse 06/16/2015  . Hypothyroidism   . OCD (obsessive compulsive disorder)   . Peptic ulcer   . Polysubstance abuse ([place])   . PTSD (post-traumatic stress disorder)     Past Surgical History:  Procedure Laterality Date  . CESAREAN SECTION    . OTHER SURGICAL HISTORY     scar tissue removed from right ovary   . OTHER  SURGICAL HISTORY     fallopian tube repair  . WISDOM TOOTH EXTRACTION     Family History:  Family History  Problem Relation Age of Onset  . Drug abuse Brother   . Suicidality Brother   . Cancer Other   . Depression Mother   . Drug abuse Mother    . Alcoholism Father   . Bipolar disorder Cousin    Family Psychiatric  History:  Social History:  Social History   Substance and Sexual Activity  Alcohol Use Yes   Comment: occasional use     Social History   Substance and Sexual Activity  Drug Use Yes  . Types: Marijuana, Methamphetamines, MDMA (Ecstacy), Oxycodone, Benzodiazepines, Cocaine, IV   Comment: uses heroin, crack and marijuana,  last used 10/04/2017    Social History   Socioeconomic History  . Marital status: Legally Separated    Spouse name: Not on file  . Number of children: Not on file  . Years of education: Not on file  . Highest education level: Not on file  Occupational History  . Not on file  Social Needs  . Financial resource strain: Not on file  . Food insecurity:    Worry: Not on file    Inability: Not on file  . Transportation needs:    Medical: Not on file    Non-medical: Not on file  Tobacco Use  . Smoking status: Current Every Day Smoker    Packs/day: 1.00    Years: 10.00    Pack years: 10.00    Types: Cigarettes  . Smokeless tobacco: Never Used  Substance and Sexual Activity  . Alcohol use: Yes    Comment: occasional use  . Drug use: Yes    Types: Marijuana, Methamphetamines, MDMA (Ecstacy), Oxycodone, Benzodiazepines, Cocaine, IV    Comment: uses heroin, crack and marijuana,  last used 10/04/2017  . Sexual activity: Yes    Birth control/protection: None  Lifestyle  . Physical activity:    Days per week: Not on file    Minutes per session: Not on file  . Stress: Not on file  Relationships  . Social connections:    Talks on phone: Not on file    Gets together: Not on file    Attends religious service: Not on file    Active member of club or organization: Not on file    Attends meetings of clubs or organizations: Not on file    Relationship status: Not on file  Other Topics Concern  . Not on file  Social History Narrative  . Not on file   Additional Social History:                          Sleep: Fair  Appetite:  Fair  Current Medications: Current Facility-Administered Medications  Medication Dose Route Frequency Provider Last Rate Last Dose  . acetaminophen (TYLENOL) tablet 650 mg  650 mg Oral Q6H PRN Money, Lowry Ram, FNP   650 mg at 12/02/17 1634  . alum & mag hydroxide-simeth (MAALOX/MYLANTA) 200-200-20 MG/5ML suspension 30 mL  30 mL Oral Q4H PRN Money, Darnelle Maffucci B, FNP      . benztropine (COGENTIN) tablet 0.5 mg  0.5 mg Oral BID Derrill Center, NP   0.5 mg at 12/03/17 0911  . cephALEXin (KEFLEX) capsule 500 mg  500 mg Oral Q8H Sharma Covert, MD   500 mg at 12/03/17 0911  . hydrOXYzine (ATARAX/VISTARIL) tablet 25 mg  25  mg Oral TID PRN Money, Lowry Ram, FNP   25 mg at 12/02/17 1909  . magnesium hydroxide (MILK OF MAGNESIA) suspension 30 mL  30 mL Oral Daily PRN Money, Lowry Ram, FNP      . nicotine polacrilex (NICORETTE) gum 2 mg  2 mg Oral PRN Patriciaann Clan E   2 mg at 12/03/17 0911  . OLANZapine (ZYPREXA) tablet 7.5 mg  7.5 mg Oral QHS Sharma Covert, MD   7.5 mg at 12/02/17 2111  . traZODone (DESYREL) tablet 50 mg  50 mg Oral QHS PRN Money, Lowry Ram, FNP   50 mg at 12/02/17 2111    Lab Results:  Results for orders placed or performed during the hospital encounter of 12/01/17 (from the past 48 hour(s))  Basic metabolic panel     Status: Abnormal   Collection Time: 12/01/17  1:33 PM  Result Value Ref Range   Sodium 136 135 - 145 mmol/L   Potassium 3.6 3.5 - 5.1 mmol/L   Chloride 96 (L) 98 - 111 mmol/L    Comment: Please note change in reference range.   CO2 28 22 - 32 mmol/L   Glucose, Bld 115 (H) 70 - 99 mg/dL    Comment: Please note change in reference range.   BUN 8 6 - 20 mg/dL    Comment: Please note change in reference range.   Creatinine, Ser 0.76 0.44 - 1.00 mg/dL   Calcium 9.5 8.9 - 10.3 mg/dL   GFR calc non Af Amer >60 >60 mL/min   GFR calc Af Amer >60 >60 mL/min    Comment: (NOTE) The eGFR has been calculated  using the CKD EPI equation. This calculation has not been validated in all clinical situations. eGFR's persistently <60 mL/min signify possible Chronic Kidney Disease.    Anion gap 12 5 - 15    Comment: Performed at [facility], [address], [place], [place] [address]  CBC with Differential     Status: Abnormal   Collection Time: 12/01/17  1:33 PM  Result Value Ref Range   WBC 12.9 (H) 4.0 - 10.5 K/uL   RBC 4.90 3.87 - 5.11 MIL/uL   Hemoglobin 15.0 12.0 - 15.0 g/dL   HCT 44.0 36.0 - 46.0 %   MCV 89.8 78.0 - 100.0 fL   MCH 30.6 26.0 - 34.0 pg   MCHC 34.1 30.0 - 36.0 g/dL   RDW 15.4 11.5 - 15.5 %   Platelets 300 150 - 400 K/uL   Neutrophils Relative % 70 %   Neutro Abs 9.0 (H) 1.7 - 7.7 K/uL   Lymphocytes Relative 25 %   Lymphs Abs 3.2 0.7 - 4.0 K/uL   Monocytes Relative 5 %   Monocytes Absolute 0.7 0.1 - 1.0 K/uL   Eosinophils Relative 0 %   Eosinophils Absolute 0.0 0.0 - 0.7 K/uL   Basophils Relative 0 %   Basophils Absolute 0.0 0.0 - 0.1 K/uL    Comment: Performed at [facility], [address] [place], [place] [address]  Acetaminophen level     Status: Abnormal   Collection Time: 12/01/17  1:33 PM  Result Value Ref Range   Acetaminophen (Tylenol), Serum <10 (L) 10 - 30 ug/mL    Comment: (NOTE) Therapeutic concentrations vary significantly. A range of 10-30 ug/mL  may be an effective concentration for many patients. However, some  are best treated at concentrations outside of this range. Acetaminophen concentrations >150 ug/mL at 4 hours after ingestion  and >50 ug/mL  at 12 hours after ingestion are often associated with  toxic reactions. Performed at [facility], [address], [place], [place] [address]   Salicylate level     Status: None   Collection Time: 12/01/17  1:33 PM  Result Value Ref Range   Salicylate Lvl <8.6 2.8 - 30.0 mg/dL    Comment: Performed at [facility], [address] [place], [place] [address]  Carbamazepine level, total     Status:  Abnormal   Collection Time: 12/01/17  1:33 PM  Result Value Ref Range   Carbamazepine Lvl 12.3 (H) 4.0 - 12.0 ug/mL    Comment: Performed at [facility], [address], [place], [place] [address]  Hepatic function panel     Status: Abnormal   Collection Time: 12/01/17  1:33 PM  Result Value Ref Range   Total Protein 8.8 (H) 6.5 - 8.1 g/dL   Albumin 4.6 3.5 - 5.0 g/dL   AST 63 (H) 15 - 41 U/L   ALT 140 (H) 0 - 44 U/L    Comment: Please note change in reference range.   Alkaline Phosphatase 151 (H) 38 - 126 U/L   Total Bilirubin 1.0 0.3 - 1.2 mg/dL   Bilirubin, Direct 0.2 0.0 - 0.2 mg/dL    Comment: Please note change in reference range.   Indirect Bilirubin 0.8 0.3 - 0.9 mg/dL    Comment: Performed at [facility], [address], [place], [place] [address]  Ethanol     Status: None   Collection Time: 12/01/17  1:33 PM  Result Value Ref Range   Alcohol, Ethyl (B) <10 <10 mg/dL    Comment: (NOTE) Lowest detectable limit for serum alcohol is 10 mg/dL. For medical purposes only. Performed at [facility], [address] [place], [place] [address]   Pregnancy, urine     Status: None   Collection Time: 12/01/17  2:30 PM  Result Value Ref Range   Preg Test, Ur NEGATIVE NEGATIVE    Comment:        THE SENSITIVITY OF THIS METHODOLOGY IS >20 mIU/mL. Performed at [facility], [address] [place],  [address]   Urine rapid drug screen (hosp performed)     Status: Abnormal   Collection Time: 12/01/17  2:30 PM  Result Value Ref Range   Opiates NONE DETECTED NONE DETECTED   Cocaine NONE DETECTED NONE DETECTED   Benzodiazepines POSITIVE (A) NONE DETECTED   Amphetamines NONE DETECTED NONE DETECTED   Tetrahydrocannabinol POSITIVE (A) NONE DETECTED   Barbiturates (A) NONE DETECTED    Result not available. Reagent lot number recalled by manufacturer.    Comment: (NOTE) DRUG SCREEN FOR MEDICAL PURPOSES ONLY.  IF CONFIRMATION IS NEEDED FOR ANY PURPOSE, NOTIFY LAB WITHIN 5  DAYS. LOWEST DETECTABLE LIMITS FOR URINE DRUG SCREEN Drug Class                     Cutoff (ng/mL) Amphetamine and metabolites    1000 Barbiturate and metabolites    200 Benzodiazepine                 264 Tricyclics and metabolites     300 Opiates and metabolites        300 Cocaine and metabolites        300 THC                            50 Performed at [facility],  [address] [place], [place] [address]   Urinalysis, Routine w reflex microscopic     Status: Abnormal   Collection Time: 12/01/17  2:30 PM  Result Value Ref Range   Color, Urine AMBER (A) YELLOW    Comment: BIOCHEMICALS MAY BE AFFECTED BY COLOR   APPearance HAZY (A) CLEAR   Specific Gravity, Urine 1.030 1.005 - 1.030   pH 6.0 5.0 - 8.0   Glucose, UA NEGATIVE NEGATIVE mg/dL   Hgb urine dipstick NEGATIVE NEGATIVE   Bilirubin Urine MODERATE (A) NEGATIVE   Ketones, ur NEGATIVE NEGATIVE mg/dL   Protein, ur 100 (A) NEGATIVE mg/dL   Nitrite NEGATIVE NEGATIVE   Leukocytes, UA MODERATE (A) NEGATIVE   RBC / HPF 6-10 0 - 5 RBC/hpf   WBC, UA 21-50 0 - 5 WBC/hpf   Bacteria, UA RARE (A) NONE SEEN   Squamous Epithelial / LPF 11-20 0 - 5   Mucus PRESENT     Comment: Performed at [facility], [address] [place], [place] [address]    Blood Alcohol level:  Lab Results  Component Value Date   [facility] <10 12/01/2017   ETH <10 85/63/1497    Metabolic Disorder Labs: Lab Results  Component Value Date   HGBA1C 5.3 05/27/2017   MPG 105.41 05/27/2017   MPG 105 10/16/2016   Lab Results  Component Value Date   PROLACTIN 8.8 10/16/2016   PROLACTIN 49.9 (H) 06/07/2016   Lab Results  Component Value Date   CHOL 182 05/27/2017   TRIG 254 (H) 05/27/2017   HDL 49 05/27/2017   CHOLHDL 3.7 05/27/2017   VLDL 51 (H) 05/27/2017   LDLCALC 82 05/27/2017   LDLCALC 75 10/16/2016    Physical Findings: AIMS: Facial and Oral Movements Muscles of Facial Expression: None, normal Lips and Perioral Area: None, normal Jaw: None,  normal Tongue: None, normal,Extremity Movements Upper (arms, wrists, hands, fingers): None, normal Lower (legs, knees, ankles, toes): None, normal, Trunk Movements Neck, shoulders, hips: None, normal, Overall Severity Severity of abnormal movements (highest score from questions above): None, normal Incapacitation due to abnormal movements: None, normal Patient's awareness of abnormal movements (rate only patient's report): No Awareness, Dental Status Current problems with teeth and/or dentures?: No Does patient usually wear dentures?: No  CIWA:  CIWA-Ar Total: 0 COWS:  COWS Total Score: 1  Musculoskeletal: Strength & Muscle Tone: within normal limits Gait & Station: normal Patient leans: N/A  Psychiatric Specialty Exam: Physical Exam  Review of Systems  Psychiatric/Behavioral: Positive for depression and substance abuse. The patient is nervous/anxious.   All other systems reviewed and are negative.   Blood pressure 122/84, pulse 90, temperature 98.9 F (37.2 C), temperature source Oral, resp. rate 16, height 5' 2" (1.575 m), weight 80.3 kg (177 lb).Body mass index is 32.37 kg/m.  General Appearance: Casual and Disheveled  Eye Contact:  Poor  Speech:  Clear and Coherent  Volume:  Decreased  Mood:  Anxious and Depressed  Affect:  Congruent, Non-Congruent, Depressed and Flat  Thought Process:  Coherent  Orientation:  Full (Time, Place, and Person)  Thought Content:  Hallucinations: None  Suicidal Thoughts:  No  Homicidal Thoughts:  No  Memory:  Immediate;   Fair Recent;   Fair Remote;   Fair  Judgement:  Fair  Insight:  Fair  Psychomotor Activity:  Normal  Concentration:  Concentration: Fair  Recall:  [employer] of Knowledge:  Fair  Language:  Fair  Akathisia:  No  Handed:  Right  AIMS (  if indicated):     Assets:  Communication Skills Desire for Improvement Resilience Social Support  ADL's:  Intact  Cognition:  WNL  Sleep:  Number of Hours: 6.75     Treatment  Plan Summary: Daily contact with patient to assess and evaluate symptoms and progress in treatment and Medication management   Continue with current treatment plan on 12/03/2017 as listed below except were noted   Continue with Haldol 80m PO BID  for mood stabilization. Continue with Trazodone 50 mg for insomnia  Will continue to monitor vitals ,medication compliance and treatment side effects while patient is here.  Reviewed labs: AST/ALT 63/140 hx of hep c infection ,BAL - , UDS -thc and benzodizpines. CSW will continue working on discharge disposition.  Patient to participate in therapeutic milieu   TDerrill Center NP 12/03/2017, 9:27 AM

## 2017-12-04 MED ORDER — LOPERAMIDE HCL 2 MG PO CAPS
2.0000 mg | ORAL_CAPSULE | ORAL | Status: DC | PRN
  Administered 2017-12-04: 4 mg via ORAL
  Filled 2017-12-04: qty 2

## 2017-12-04 MED ORDER — NAPROXEN 500 MG PO TABS: 500.0000 mg | ORAL_TABLET | Freq: Two times a day (BID) | ORAL | Status: DC | PRN

## 2017-12-04 MED ORDER — RISPERIDONE 2 MG PO TABS: 2.0000 mg | ORAL_TABLET | Freq: Every day | ORAL | Status: DC

## 2017-12-04 MED ORDER — DICYCLOMINE HCL 20 MG PO TABS
20.0000 mg | ORAL_TABLET | Freq: Four times a day (QID) | ORAL | Status: DC | PRN
  Administered 2017-12-06: 20 mg via ORAL
  Filled 2017-12-04: qty 1

## 2017-12-04 MED ORDER — CLONIDINE HCL 0.1 MG PO TABS: 0.1000 mg | ORAL_TABLET | Freq: Every day | ORAL | Status: DC

## 2017-12-04 MED ORDER — TRAZODONE HCL 50 MG PO TABS
50.0000 mg | ORAL_TABLET | Freq: Every evening | ORAL | Status: DC | PRN
  Administered 2017-12-04 – 2017-12-07 (×7): 50 mg via ORAL
  Filled 2017-12-04 (×14): qty 1

## 2017-12-04 MED ORDER — ONDANSETRON 4 MG PO TBDP: 4.0000 mg | ORAL_TABLET | Freq: Four times a day (QID) | ORAL | Status: DC | PRN

## 2017-12-04 MED ORDER — RISPERIDONE 2 MG PO TABS
2.0000 mg | ORAL_TABLET | Freq: Two times a day (BID) | ORAL | Status: DC
  Administered 2017-12-04: 2 mg via ORAL
  Filled 2017-12-04 (×6): qty 1

## 2017-12-04 MED ORDER — HYDROXYZINE HCL 25 MG PO TABS
25.0000 mg | ORAL_TABLET | Freq: Four times a day (QID) | ORAL | Status: DC | PRN
  Administered 2017-12-04: 25 mg via ORAL
  Filled 2017-12-04: qty 1

## 2017-12-04 MED ORDER — TRAZODONE HCL 50 MG PO TABS
ORAL_TABLET | ORAL | Status: AC
  Filled 2017-12-04: qty 1

## 2017-12-04 MED ORDER — METHOCARBAMOL 500 MG PO TABS: 500.0000 mg | ORAL_TABLET | Freq: Three times a day (TID) | ORAL | Status: DC | PRN

## 2017-12-04 MED ORDER — CLONIDINE HCL 0.1 MG PO TABS
0.1000 mg | ORAL_TABLET | ORAL | Status: DC
  Administered 2017-12-07: 0.1 mg via ORAL
  Filled 2017-12-04 (×3): qty 1

## 2017-12-04 MED ORDER — DICYCLOMINE HCL 20 MG PO TABS
20.0000 mg | ORAL_TABLET | Freq: Four times a day (QID) | ORAL | Status: DC | PRN
  Administered 2017-12-04: 20 mg via ORAL
  Filled 2017-12-04: qty 1

## 2017-12-04 MED ORDER — RISPERIDONE 2 MG PO TABS
2.0000 mg | ORAL_TABLET | Freq: Three times a day (TID) | ORAL | Status: DC
  Administered 2017-12-04 – 2017-12-05 (×3): 2 mg via ORAL
  Filled 2017-12-04 (×9): qty 1

## 2017-12-04 MED ORDER — LOPERAMIDE HCL 2 MG PO CAPS: 2.0000 mg | ORAL_CAPSULE | ORAL | Status: DC | PRN

## 2017-12-04 MED ORDER — HYDROXYZINE HCL 25 MG PO TABS: 25.0000 mg | ORAL_TABLET | Freq: Four times a day (QID) | ORAL | Status: DC | PRN

## 2017-12-04 MED ORDER — CLONIDINE HCL 0.1 MG PO TABS
0.1000 mg | ORAL_TABLET | Freq: Four times a day (QID) | ORAL | Status: AC
  Administered 2017-12-04 – 2017-12-06 (×10): 0.1 mg via ORAL
  Filled 2017-12-04 (×13): qty 1

## 2017-12-04 MED ORDER — FLUOXETINE HCL 20 MG PO CAPS
40.0000 mg | ORAL_CAPSULE | Freq: Every day | ORAL | Status: DC
  Administered 2017-12-04 – 2017-12-08 (×5): 40 mg via ORAL
  Filled 2017-12-04 (×2): qty 2
  Filled 2017-12-04: qty 14
  Filled 2017-12-04 (×5): qty 2
  Filled 2017-12-04: qty 14

## 2017-12-04 MED ORDER — GABAPENTIN 300 MG PO CAPS
600.0000 mg | ORAL_CAPSULE | Freq: Three times a day (TID) | ORAL | Status: DC
  Administered 2017-12-04 – 2017-12-08 (×13): 600 mg via ORAL
  Filled 2017-12-04 (×2): qty 42
  Filled 2017-12-04 (×4): qty 2
  Filled 2017-12-04: qty 42
  Filled 2017-12-04 (×11): qty 2
  Filled 2017-12-04: qty 42
  Filled 2017-12-04 (×2): qty 2
  Filled 2017-12-04 (×2): qty 42

## 2017-12-04 NOTE — Tx Team (Signed)
Interdisciplinary Treatment and Diagnostic Plan Update  12/04/2017 Time of Session: 8:39 AM  Becky [name] MRN: [id number]  Principal Diagnosis: Schizoaffective disorder [facility])  Secondary Diagnoses: Principal Problem:   Schizoaffective disorder (HCC)   Current Medications:  Current Facility-Administered Medications  Medication Dose Route Frequency Provider Last Rate Last Dose  . acetaminophen (TYLENOL) tablet 650 mg  650 mg Oral Q6H PRN Money, Gerlene Burdock, FNP   650 mg at 12/03/17 2100  . alum & mag hydroxide-simeth (MAALOX/MYLANTA) 200-200-20 MG/5ML suspension 30 mL  30 mL Oral Q4H PRN Money, Gerlene Burdock, FNP   30 mL at 12/04/17 0332  . benztropine (COGENTIN) tablet 0.5 mg  0.5 mg Oral BID Oneta Rack, NP   0.5 mg at 12/03/17 1600  . cephALEXin (KEFLEX) capsule 500 mg  500 mg Oral Q8H Antonieta Pert, MD   500 mg at 12/04/17 0547  . diphenhydrAMINE (BENADRYL) injection 50 mg  50 mg Intramuscular Once Antonieta Pert, MD      . hydrOXYzine (ATARAX/VISTARIL) tablet 50 mg  50 mg Oral TID PRN Money, Gerlene Burdock, FNP   50 mg at 12/03/17 1904  . magnesium hydroxide (MILK OF MAGNESIA) suspension 30 mL  30 mL Oral Daily PRN Money, [name] B, FNP      . nicotine polacrilex (NICORETTE) gum 2 mg  2 mg Oral PRN Donell Sievert E   2 mg at 12/04/17 0547  . OLANZapine (ZYPREXA) tablet 7.5 mg  7.5 mg Oral QHS Antonieta Pert, MD   7.5 mg at 12/03/17 2100  . traZODone (DESYREL) tablet 50 mg  50 mg Oral QHS,MR X 1 Kerry Hough, PA-C   50 mg at 12/04/17 4098    PTA Medications: Medications Prior to Admission  Medication Sig Dispense Refill Last Dose  . benztropine (COGENTIN) 0.5 MG tablet Take 1 tablet (0.5 mg total) by mouth at bedtime. For prevention of drug induced tremors. 60 tablet 0 Past Week at Unknown time  . Buprenorphine HCl-Naloxone HCl (SUBOXONE) 12-3 MG FILM Place 1 Film under the tongue daily.   Past Month at Unknown time  . carbamazepine (TEGRETOL) 200 MG tablet Take 1 tablet  by mouth 3 (three) times daily.   Past Month at Unknown time  . FLUoxetine (PROZAC) 10 MG capsule Take 3 capsules (30 mg total) by mouth daily. 90 capsule 0 10/04/2017 at 1300  . gabapentin (NEURONTIN) 600 MG tablet Take 1 tablet (600 mg total) by mouth 3 (three) times daily. 90 tablet 0 10/04/2017 at 1800  . haloperidol (HALDOL) 5 MG tablet Take 1 tablet (5 mg total) by mouth 3 (three) times daily. 90 tablet 0 10/04/2017 at 1800  . levothyroxine (SYNTHROID, LEVOTHROID) 50 MCG tablet Take 1 tablet (50 mcg total) by mouth daily before breakfast. For low thyroid Hormone 30 tablet 0 10/04/2017 at Unknown time  . mirtazapine (REMERON) 7.5 MG tablet Take 1 tablet (7.5 mg total) by mouth at bedtime. For depression/sleep 30 tablet 0 10/04/2017 at 1900  . prazosin (MINIPRESS) 2 MG capsule Take 1 capsule (2 mg total) by mouth at bedtime. For nightmares 30 capsule 0 10/04/2017 at 1900    Patient Stressors: Financial difficulties Marital or family conflict Medication change or noncompliance Occupational concerns Substance abuse  Patient Strengths: Wellsite geologist fund of knowledge Physical Health  Treatment Modalities: Medication Management, Group therapy, Case management,  1 to 1 session with clinician, Psychoeducation, Recreational therapy.   Physician Treatment Plan for Primary Diagnosis: Schizoaffective disorder [facility]) Long Term Goal(s): Improvement  in symptoms so as ready for discharge  Short Term Goals: Ability to identify changes in lifestyle to reduce recurrence of condition will improve Ability to verbalize feelings will improve Compliance with prescribed medications will improve Ability to identify triggers associated with substance abuse/mental health issues will improve Ability to identify changes in lifestyle to reduce recurrence of condition will improve Ability to verbalize feelings will improve Compliance with prescribed medications will improve Ability to identify  triggers associated with substance abuse/mental health issues will improve  Medication Management: Evaluate patient's response, side effects, and tolerance of medication regimen.  Therapeutic Interventions: 1 to 1 sessions, Unit Group sessions and Medication administration.  Evaluation of Outcomes: Progressing  Physician Treatment Plan for Secondary Diagnosis: Principal Problem:   Schizoaffective disorder (HCC)   Long Term Goal(s): Improvement in symptoms so as ready for discharge  Short Term Goals: Ability to identify changes in lifestyle to reduce recurrence of condition will improve Ability to verbalize feelings will improve Compliance with prescribed medications will improve Ability to identify triggers associated with substance abuse/mental health issues will improve Ability to identify changes in lifestyle to reduce recurrence of condition will improve Ability to verbalize feelings will improve Compliance with prescribed medications will improve Ability to identify triggers associated with substance abuse/mental health issues will improve  Medication Management: Evaluate patient's response, side effects, and tolerance of medication regimen.  Therapeutic Interventions: 1 to 1 sessions, Unit Group sessions and Medication administration.  Evaluation of Outcomes: Progressing   RN Treatment Plan for Primary Diagnosis: Schizoaffective disorder (HCC) Long Term Goal(s): Knowledge of disease and therapeutic regimen to maintain health will improve  Short Term Goals: Ability to identify and develop effective coping behaviors will improve and Compliance with prescribed medications will improve  Medication Management: RN will administer medications as ordered by provider, will assess and evaluate patient's response and provide education to patient for prescribed medication. RN will report any adverse and/or side effects to prescribing provider.  Therapeutic Interventions: 1 on 1  counseling sessions, Psychoeducation, Medication administration, Evaluate responses to treatment, Monitor vital signs and CBGs as ordered, Perform/monitor CIWA, COWS, AIMS and Fall Risk screenings as ordered, Perform wound care treatments as ordered.  Evaluation of Outcomes: Progressing   LCSW Treatment Plan for Primary Diagnosis: Schizoaffective disorder (HCC) Long Term Goal(s): Safe transition to appropriate next level of care at discharge, Engage patient in therapeutic group addressing interpersonal concerns.  Short Term Goals: Engage patient in aftercare planning with referrals and resources  Therapeutic Interventions: Assess for all discharge needs, 1 to 1 time with Social worker, Explore available resources and support systems, Assess for adequacy in community support network, Educate family and significant other(s) on suicide prevention, Complete Psychosocial Assessment, Interpersonal group therapy.  Evaluation of Outcomes: Progressing  Return home, follow up [facility]   Progress in Treatment: Attending groups: Yes Participating in groups: Yes Taking medication as prescribed: Yes Toleration medication: Yes, no side effects reported at this time Family/Significant other contact made: No Patient understands diagnosis: Yes AEB asking for medications to treat symptoms. Discussing patient identified problems/goals with staff: Yes Medical problems stabilized or resolved: Yes Denies suicidal/homicidal ideation: Yes Issues/concerns per patient self-inventory: None Other: N/A  New problem(s) identified: None identified at this time.   New Short Term/Long Term Goal(s): "I need to gat back on medication for voices, depression, anxiety. And I'm withdrawing from heroin."   Discharge Plan or Barriers:   Reason for Continuation of Hospitalization: Anxiety Depression Hallucinations Medication stabilization Suicidal ideation Withdrawal symptoms  Estimated Length of Stay:  7/26  Attendees: Patient: Becky PancoastDanielle [name] 12/04/2017  8:39 AM  Physician: Gretta CoolSheila Maurer, MD 12/04/2017  8:39 AM  Nursing: Roddie McElizabeth Awofadeju, RN 12/04/2017  8:39 AM  RN Care Manager: Onnie BoerJennifer Clark, RN 12/04/2017  8:39 AM  Social Worker: Richelle Itood Jsoeph Podesta 12/04/2017  8:39 AM  Recreational Therapist: Aggie CosierMarjette Lindsey 12/04/2017  8:39 AM  Other: Tomasita Morrowelora Sutton 12/04/2017  8:39 AM  Other:  12/04/2017  8:39 AM    Scribe for Treatment Team:  Daryel Geraldodney Zyrell Carmean LCSW 12/04/2017 8:39 AM

## 2017-12-04 NOTE — Progress Notes (Signed)
D: Pt was in her room upon initial approach.  Pt presents with anxious affect and mood.  Her goal is to "go to sleep."  Pt states "I've been having issues."  Pt reports neck pain of 3/10.  Heat packs provided.  Pt denies HI.  She reports SI without a plan.  She reports hallucinations of "different stuff."  Pt was asked to elaborate but she did not.  Pt has been isolative to her room for the majority of the night.      A: Introduced self to pt.  Actively listened to pt and offered support and encouragement. Medications administered per order.  PRN medication administered for sleep and pain.  Q15 minute safety checks maintained.  R: Pt is safe on the unit.  Pt is compliant with medications.  Pt verbally contracts for safety.  Will continue to monitor and assess.

## 2017-12-04 NOTE — Progress Notes (Addendum)
[facility]  12/04/2017 1:10 PM Becky [name]  MRN:  [id number]   History:Harrelsonis an 27 y.o.female.The pt came in under IVC after taking 10-12 Tegretol pills in a suicide attempt. The pt is vague about stating her major stressors. When asked about her stressors she stated "life". She later stated she is having financial problems. The pt was discharged from [facility] three weeks ago for SI. She stated she wants to get back on medication. She reported she had a prescription after leaving [facility], but did not have the money to get the prescription filled. The pt stated she has not been to [facility] or any other out patient treatment for about 3 years. The pt has had several hospitalizations in the past.  The chart findings reviewed and dicussed with the treatment team. Patient see with treatment team. Becky [name] reports that she is having AH, depression and anxiety.  She states that she was discharged 3 weeks ago from inpatient psychiatry on Zyprexa, haldol and gabapentin but has not had follow-up with a psychiatrist since discharge. She states that she used Prozac 40 mg QD for depression, gabapentin 600 mg TID for anxiety.  Becky [name] reports that she had been using heroin since discharge, with last use 3 days ago.  She requests medications for withdrawal symptoms. Reports decreased appetite and poor sleep. Becky [name] denies any symptoms of depression, SIHI, VH, delusional thoughts or paranoia, and does not appear to be responding to any internal stimuli. Patient is visible on the milieu.  Patient seen attending group sessions with active and engaged participation. Becky [name] has agreed to continue the current plan of care already in progress. She denies any other issues or concerns. Support encouragement reassurance was provided.  Principal Problem: Schizoaffective disorder (HCC) Diagnosis:   Patient Active Problem List   Diagnosis Date Noted  .  Schizoaffective disorder (HCC) [F25.9] 12/01/2017  . Alcohol abuse [F10.10] 05/26/2017  . Schizoaffective disorder, bipolar type (HCC) [F25.0] 05/26/2017  . Polysubstance abuse (HCC) [F19.10]   . Altered mental state [R41.82] 05/08/2017  . Hallucinations [R44.3]   . Bipolar disorder, curr episode mixed, severe, with psychotic features (HCC) [F31.64] 11/02/2016  . PTSD (post-traumatic stress disorder) [F43.10] 11/02/2016  . GAD (generalized anxiety disorder) [F41.1] 10/16/2016  . Opioid use disorder, moderate, dependence (HCC) [F11.20] 06/06/2016  . Sedative, hypnotic or anxiolytic use disorder, severe, dependence (HCC) [F13.20] 06/06/2016  . Cocaine use disorder, severe, dependence (HCC) [F14.20] 06/06/2016  . Cannabis use disorder, severe, dependence (HCC) [F12.20] 06/06/2016  . MDD (major depressive disorder), recurrent severe, without psychosis (HCC) [F33.2] 06/03/2016  . Bipolar I disorder, most recent episode depressed (HCC) [F31.30] 07/04/2015  . Overdose [T50.901A] 07/02/2015  . Lactic acidosis [E87.2] 07/02/2015  . Acute respiratory failure (HCC) [J96.00] 07/02/2015  . Sepsis (HCC) [A41.9] 07/02/2015  . AKI (acute kidney injury) (HCC) [N17.9]   . Altered mental status [R41.82]   . Pyrexia [R50.9]   . Abdominal pain, epigastric [R10.13] 06/17/2015  . Hepatitis C antibody test positive [R76.8] 06/17/2015  . Peptic ulcer disease [K27.9] 06/17/2015  . Epigastric pain [R10.13]   . Transaminitis [R74.0] 06/16/2015  . History of substance abuse [Z87.898] 06/16/2015   Total Time spent with patient: 20 minutes  Past Psychiatric History:   Past Medical History:  Past Medical History:  Diagnosis Date  . Bipolar 1 disorder (HCC)   . Bronchitis   . GAD (generalized anxiety disorder)   . Hepatitis C antibody test positive 06/17/2015  . History of substance abuse  06/16/2015  . Hypothyroidism   . OCD (obsessive compulsive disorder)   . Peptic ulcer   . Polysubstance abuse (HCC)   .  PTSD (post-traumatic stress disorder)     Past Surgical History:  Procedure Laterality Date  . CESAREAN SECTION    . OTHER SURGICAL HISTORY     scar tissue removed from right ovary   . OTHER SURGICAL HISTORY     fallopian tube repair  . WISDOM TOOTH EXTRACTION     Family History:  Family History  Problem Relation Age of Onset  . Drug abuse Brother   . Suicidality Brother   . Cancer Other   . Depression Mother   . Drug abuse Mother   . Alcoholism Father   . Bipolar disorder Cousin    Family Psychiatric  History:  Social History:  Social History   Substance and Sexual Activity  Alcohol Use Yes   Comment: occasional use     Social History   Substance and Sexual Activity  Drug Use Yes  . Types: Marijuana, Methamphetamines, MDMA (Ecstacy), Oxycodone, Benzodiazepines, Cocaine, IV   Comment: uses heroin, crack and marijuana,  last used 10/04/2017    Social History   Socioeconomic History  . Marital status: Legally Separated    Spouse name: Not on file  . Number of children: Not on file  . Years of education: Not on file  . Highest education level: Not on file  Occupational History  . Not on file  Social Needs  . Financial resource strain: Not on file  . Food insecurity:    Worry: Not on file    Inability: Not on file  . Transportation needs:    Medical: Not on file    Non-medical: Not on file  Tobacco Use  . Smoking status: Current Every Day Smoker    Packs/day: 1.00    Years: 10.00    Pack years: 10.00    Types: Cigarettes  . Smokeless tobacco: Never Used  Substance and Sexual Activity  . Alcohol use: Yes    Comment: occasional use  . Drug use: Yes    Types: Marijuana, Methamphetamines, MDMA (Ecstacy), Oxycodone, Benzodiazepines, Cocaine, IV    Comment: uses heroin, crack and marijuana,  last used 10/04/2017  . Sexual activity: Yes    Birth control/protection: None  Lifestyle  . Physical activity:    Days per week: Not on file    Minutes per session:  Not on file  . Stress: Not on file  Relationships  . Social connections:    Talks on phone: Not on file    Gets together: Not on file    Attends religious service: Not on file    Active member of club or organization: Not on file    Attends meetings of clubs or organizations: Not on file    Relationship status: Not on file  Other Topics Concern  . Not on file  Social History Narrative  . Not on file   Additional Social History:                         Sleep: Fair  Appetite:  Fair  Current Medications: Current Facility-Administered Medications  Medication Dose Route Frequency Provider Last Rate Last Dose  . acetaminophen (TYLENOL) tablet 650 mg  650 mg Oral Q6H PRN Money, Gerlene Burdock, FNP   650 mg at 12/03/17 2100  . alum & mag hydroxide-simeth (MAALOX/MYLANTA) 200-200-20 MG/5ML suspension 30 mL  30 mL Oral  Q4H PRN Money, Gerlene Burdock, FNP   30 mL at 12/04/17 0332  . benztropine (COGENTIN) tablet 0.5 mg  0.5 mg Oral BID Oneta Rack, NP   0.5 mg at 12/03/17 1600  . cephALEXin (KEFLEX) capsule 500 mg  500 mg Oral Q8H Antonieta Pert, MD   500 mg at 12/04/17 1306  . dicyclomine (BENTYL) tablet 20 mg  20 mg Oral Q6H PRN Mariel Craft, MD   20 mg at 12/04/17 1019  . diphenhydrAMINE (BENADRYL) injection 50 mg  50 mg Intramuscular Once Antonieta Pert, MD      . FLUoxetine (PROZAC) capsule 40 mg  40 mg Oral Daily Mariel Craft, MD   40 mg at 12/04/17 1019  . gabapentin (NEURONTIN) capsule 600 mg  600 mg Oral TID Mariel Craft, MD   600 mg at 12/04/17 1019  . hydrOXYzine (ATARAX/VISTARIL) tablet 25 mg  25 mg Oral Q6H PRN Mariel Craft, MD      . hydrOXYzine (ATARAX/VISTARIL) tablet 50 mg  50 mg Oral TID PRN Money, Gerlene Burdock, FNP   50 mg at 12/03/17 1904  . loperamide (IMODIUM) capsule 2-4 mg  2-4 mg Oral PRN Mariel Craft, MD   4 mg at 12/04/17 1018  . magnesium hydroxide (MILK OF MAGNESIA) suspension 30 mL  30 mL Oral Daily PRN Money, Gerlene Burdock, FNP      .  methocarbamol (ROBAXIN) tablet 500 mg  500 mg Oral Q8H PRN Mariel Craft, MD      . naproxen (NAPROSYN) tablet 500 mg  500 mg Oral BID PRN Mariel Craft, MD      . nicotine polacrilex (NICORETTE) gum 2 mg  2 mg Oral PRN Donell Sievert E   2 mg at 12/04/17 1306  . ondansetron (ZOFRAN-ODT) disintegrating tablet 4 mg  4 mg Oral Q6H PRN Mariel Craft, MD      . risperiDONE (RISPERDAL) tablet 2 mg  2 mg Oral BID Mariel Craft, MD   2 mg at 12/04/17 1019  . traZODone (DESYREL) tablet 50 mg  50 mg Oral QHS,MR X 1 Kerry Hough, PA-C   50 mg at 12/04/17 0865    Lab Results:  Results for orders placed or performed during the hospital encounter of 12/01/17 (from the past 48 hour(s))  TSH     Status: None   Collection Time: 12/03/17  7:03 PM  Result Value Ref Range   TSH 1.028 0.350 - 4.500 uIU/mL    Comment: Performed by a 3rd Generation assay with a functional sensitivity of <=0.01 uIU/mL. Performed at [facility], [address], [place], [place] [address]     Blood Alcohol level:  Lab Results  Component Value Date   ETH <10 12/01/2017   ETH <10 10/13/2017    Metabolic Disorder Labs: Lab Results  Component Value Date   HGBA1C 5.3 05/27/2017   MPG 105.41 05/27/2017   MPG 105 10/16/2016   Lab Results  Component Value Date   PROLACTIN 8.8 10/16/2016   PROLACTIN 49.9 (H) 06/07/2016   Lab Results  Component Value Date   CHOL 182 05/27/2017   TRIG 254 (H) 05/27/2017   HDL 49 05/27/2017   CHOLHDL 3.7 05/27/2017   VLDL 51 (H) 05/27/2017   LDLCALC 82 05/27/2017   LDLCALC 75 10/16/2016    Physical Findings: AIMS: Facial and Oral Movements Muscles of Facial Expression: None, normal Lips and Perioral Area: None, normal Jaw: None, normal Tongue:  None, normal,Extremity Movements Upper (arms, wrists, hands, fingers): None, normal Lower (legs, knees, ankles, toes): None, normal, Trunk Movements Neck, shoulders, hips: None, normal, Overall  Severity Severity of abnormal movements (highest score from questions above): None, normal Incapacitation due to abnormal movements: None, normal Patient's awareness of abnormal movements (rate only patient's report): No Awareness, Dental Status Current problems with teeth and/or dentures?: No Does patient usually wear dentures?: No  CIWA:  CIWA-Ar Total: 0 COWS:  COWS Total Score: 1  Musculoskeletal: Strength & Muscle Tone: within normal limits Gait & Station: normal Patient leans: N/A  Psychiatric Specialty Exam: Physical Exam  Nursing note and vitals reviewed. Constitutional: She is oriented to person, place, and time. She appears well-developed and well-nourished. No distress.  Musculoskeletal: Normal range of motion.  Neurological: She is alert and oriented to person, place, and time.  Psychiatric: She has a normal mood and affect. Her behavior is normal.    Review of Systems  Psychiatric/Behavioral: Positive for depression and substance abuse. The patient is nervous/anxious.   All other systems reviewed and are negative.   Blood pressure 126/80, pulse 88, temperature 98.7 F (37.1 C), temperature source Oral, resp. rate 16, height 5\' 2"  (1.575 m), weight 80.3 kg (177 lb).Body mass index is 32.37 kg/m.  General Appearance: Casual and Disheveled  Eye Contact:  Poor  Speech:  Clear and Coherent  Volume:  Decreased  Mood:  Anxious and Depressed  Affect:  Congruent, Non-Congruent, Depressed and Flat  Thought Process:  Coherent  Orientation:  Full (Time, Place, and Person)  Thought Content:  Hallucinations: None  Suicidal Thoughts:  No  Homicidal Thoughts:  No  Memory:  Immediate;   Fair Recent;   Fair Remote;   Fair  Judgement:  Fair  Insight:  Fair  Psychomotor Activity:  Normal  Concentration:  Concentration: Fair  Recall:  [employer] of Knowledge:  Fair  Language:  Fair  Akathisia:  No  Handed:  Right  AIMS (if indicated):     Assets:  Communication  Skills Desire for Improvement Resilience Social Support  ADL's:  Intact  Cognition:  WNL  Sleep:  Number of Hours: 0.25     Treatment Plan Summary: Daily contact with patient to assess and evaluate symptoms and progress in treatment and Medication management   Continue with current treatment plan on 12/03/2017 as listed below except were noted   Restart Prozac 40 mg QD Restart Gabapentin 600 mg TID COWS and Clonidine Protocol for withdrawal Start Risperdal 2 mg TID for AH and mood stabilization with plan to convert to LAI. Cogentin 0.5 mg BID with Risperdal to prevent EPS.  Discontinue with Haldol 5mg  PO BID  for mood stabilization. Continue with Trazodone 50 mg for insomnia  Will continue to monitor vitals ,medication compliance and treatment side effects while patient is here.  Reviewed labs: AST/ALT 63/140 hx of hep c infection ,BAL - , UDS -thc and benzodizpines. CSW will continue working on discharge disposition.  Patient to participate in therapeutic milieu   Mariel Craft, MD 12/04/2017, 1:10 PM

## 2017-12-04 NOTE — [facility] Group Notes (Signed)
LCSW Group Therapy Note   12/04/2017 1:15pm   Type of Therapy and Topic:  Group Therapy:  Overcoming Obstacles   Participation Level:  Minimal   Description of Group:    In this group patients will be encouraged to explore what they see as obstacles to their own wellness and recovery. They will be guided to discuss their thoughts, feelings, and behaviors related to these obstacles. The group will process together ways to cope with barriers, with attention given to specific choices patients can make. Each patient will be challenged to identify changes they are motivated to make in order to overcome their obstacles. This group will be process-oriented, with patients participating in exploration of their own experiences as well as giving and receiving support and challenge from other group members.   Therapeutic Goals: 1. Patient will identify personal and current obstacles as they relate to admission. 2. Patient will identify barriers that currently interfere with their wellness or overcoming obstacles.  3. Patient will identify feelings, thought process and behaviors related to these barriers. 4. Patient will identify two changes they are willing to make to overcome these obstacles:      Summary of Patient Progress   In and out multiple times, engaged when present.  At first identifed stopping her meds as her biggest obstacle, but later admitted that addiction is the biggest one.  Stated that she plans on getting a sponsor when she leaves because it worked for her before, is also open to referral to IOP.   Therapeutic Modalities:   Cognitive Behavioral Therapy Solution Focused Therapy Motivational Interviewing Relapse Prevention Therapy  Ida RogueRodney B Kerly Rigsbee, LCSW 12/04/2017 3:09 PM

## 2017-12-04 NOTE — Progress Notes (Signed)
D: Pt was at nurse's station upon initial approach.  Pt presents with depressed affect and mood.  Her speech is soft, eye contact is intense and pupils appear moderately dilated.  She reports her day has been "pretty good, I've been up all day."  Her goal is to "get good sleep."  Pt denies SI/HI, reports AH "a little bit" of "voices" saying "nothing in particular."  Pt reports pain from headache of 6/10.  Pt has been visible in milieu interacting with peers and staff appropriately.  Pt attended evening group.    A: Introduced self to pt.  Actively listened to pt and offered support and encouragement. Medications administered per order.  Medication education provided.  Fall prevention techniques reviewed with pt and pt verbalized understanding.  PRN medication administered for indigestion and pain.  Q15 minute safety checks maintained.    R: Pt is safe on the unit.  Pt is compliant with medications.  Pt verbally contracts for safety.  Will continue to monitor and assess.

## 2017-12-04 NOTE — Progress Notes (Signed)
Recreation Therapy Notes  INPATIENT RECREATION THERAPY ASSESSMENT  Patient Details Name: Becky PancoastDanielle Orwick MRN: [id number] DOB: 06/29/1990 Today's Date: 12/04/2017       Information Obtained From: Patient  Able to Participate in Assessment/Interview: Yes  Patient Presentation: Alert, Oriented, Anxious  Reason for Admission (Per Patient): Suicide Attempt  Patient Stressors: Family, Other (Comment), Work(Mom was just put in a home, lack of finances)  Coping Skills:   Isolation, TV, Aggression, Music, Substance Abuse, Talk, Prayer, Avoidance  Leisure Interests (2+):  Individual - Reading, Individual - Other (Comment)(Play with dog)  Frequency of Recreation/Participation: Other (Comment)(Daily)  Awareness of Community Resources:  Yes  Wal[employer]Community Resources:  Library, Public affairs consultantestaurants, Tree surgeonMall  Current Use: Yes  If no, Barriers?:    Expressed Interest in State Street CorporationCommunity Resource Information: No  [place] of [place]idence[place]  Patient Main Form of Transportation: Therapist, musicublic Transportation  Patient Strengths:  Love to talk to people  Patient Identified Areas of Improvement:  Take medication right  Patient Goal for Hospitalization:  "To get on right medication and continue taking them when I leave"  Current SI (including self-harm):  No  Current HI:  No  Current AVH: No  Staff Intervention Plan: Group Attendance, Collaborate with Interdisciplinary Treatment Team  Consent to Intern Participation: N/A    Caroll RancherMarjette Benji Poynter, LRT/CTRS   Lillia AbedLindsay, Shameer Molstad A 12/04/2017, 2:56 PM

## 2017-12-04 NOTE — Progress Notes (Signed)
Adult Psychoeducational Group Note  Date:  12/04/2017 Time:  9:03 PM  Group Topic/Focus:  Wrap-Up Group:   The focus of this group is to help patients review their daily goal of treatment and discuss progress on daily workbooks.  Participation Level:  Active  Participation Quality:  Appropriate  Affect:  Appropriate  Cognitive:  Appropriate  Insight: Appropriate  Engagement in Group:  Engaged  Modes of Intervention:  Discussion  Additional Comments:  The patient expressed that she rates today a 9 and attended all groups.The patient also said that she reach her goal to attend groups. Octavio Mannshigpen, Brooks Kinnan Lee 12/04/2017, 9:03 PM

## 2017-12-04 NOTE — Plan of Care (Signed)
  Problem: Activity: Goal: Interest or engagement in activities will improve Outcome: Progressing   Problem: Safety: Goal: Periods of time without injury will increase Outcome: Progressing   Problem: Medication: Goal: Compliance with prescribed medication regimen will improve Outcome: Progressing  DAR NOTE: Patient presents with anxious affect and mood.  Denies suicidal thoughts, auditory and visual hallucinations.  Reports withdrawal symptoms of tremors, cravings, diarrhea, runny nose, chilling and cramping on self inventory form.  Rates depression at 7, hopelessness at 6, and anxiety at 8.  Maintained on routine safety checks.  Medications given as prescribed.  Support and encouragement offered as needed.  Attended group and participated.  States goal for today is "get on meds."  Patient observed socializing with peers in the dayroom.  Requested and received Vistaril, Tylenol and Bentyl pain and abdominal discomfort with good effect.  Patient is safe on and off the unit.

## 2017-12-04 NOTE — Progress Notes (Signed)
Recreation Therapy Notes  Date: 7.22.19 Time: 1000 Location: 500 [address] Dayroom  Group Topic: Coping Skills  Goal Area(s) Addresses:  Patient will be able to identify positive coping skills. Patient will be able to identify benefit of using coping skills post d/c.  Intervention: Worksheet  Activity: Web Design.  Patients were to identify the things that have them "stuck" and write them inside the web.  Patients were to then write positive coping skills on the outside of the web they could use to deal with their struggles.  Education: Coping Skills, Discharge Planning.   Education Outcome: Acknowledges understanding/In group clarification offered/Needs additional education.   Clinical Observations/Feedback: Pt did not attend group.    Eliyohu Class, LRT/CTRS         Xzavian Semmel A 12/04/2017 2:32 PM 

## 2017-12-05 MED ORDER — CARBAMAZEPINE 200 MG PO TABS
200.0000 mg | ORAL_TABLET | Freq: Three times a day (TID) | ORAL | Status: DC
  Administered 2017-12-05 – 2017-12-08 (×9): 200 mg via ORAL
  Filled 2017-12-05: qty 21
  Filled 2017-12-05 (×2): qty 1
  Filled 2017-12-05: qty 21
  Filled 2017-12-05 (×6): qty 1
  Filled 2017-12-05: qty 21
  Filled 2017-12-05 (×3): qty 1
  Filled 2017-12-05: qty 21
  Filled 2017-12-05: qty 1
  Filled 2017-12-05 (×2): qty 21

## 2017-12-05 MED ORDER — LOPERAMIDE HCL 2 MG PO CAPS
4.0000 mg | ORAL_CAPSULE | Freq: Every day | ORAL | Status: DC | PRN
  Administered 2017-12-05 – 2017-12-07 (×2): 4 mg via ORAL
  Filled 2017-12-05 (×2): qty 2

## 2017-12-05 MED ORDER — RISPERIDONE 3 MG PO TABS
3.0000 mg | ORAL_TABLET | ORAL | Status: DC
  Administered 2017-12-05 – 2017-12-07 (×4): 3 mg via ORAL
  Filled 2017-12-05 (×2): qty 1
  Filled 2017-12-05: qty 3
  Filled 2017-12-05 (×6): qty 1
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
## 2017-12-05 NOTE — Progress Notes (Signed)
Recreation Therapy Notes  Animal-Assisted Activity (AAA) Program Checklist/Progress Notes Patient Eligibility Criteria Checklist & Daily Group note for Rec Tx Intervention  Date: 7.23.19 Time: 1430 Location: 300 Hall Group Room   AAA/T Program Assumption of Risk Form signed by Patient/ or Parent Legal Guardian YES   Patient is free of allergies or sever asthma YES   Patient reports no fear of animals YES  Patient reports no history of cruelty to animals YES   Patient understands his/her participation is voluntary YES   Patient washes hands before animal contact YES   Patient washes hands after animal contact YES   Behavioral Response: Engaged  Education: Hand Washing, Appropriate Animal Interaction   Education Outcome: Acknowledges understanding/In group clarification offered/Needs additional education.   Clinical Observations/Feedback:  Pt attended and participated in activity.    Alaric Gladwin, LRT/CTRS         Miroslav Gin A 12/05/2017 3:46 PM 

## 2017-12-05 NOTE — Progress Notes (Signed)
[facility] MD Progress Note  12/05/2017 4:48 PM Becky [name]  MRN:  [id number]   NWG:[id number] a 27 y.o.female.The pt came in under IVC after taking 10-12 Tegretol pills in a suicide attempt. The pt is vague about stating her major stressors. When asked about her stressors she stated "life". She later stated she is having financial problems. The pt was discharged from [facility] three weeks ago for SI. She stated she wants to get back on medication. She reported she had a prescription after leaving [place] [facility], but did not have the money to get the prescription filled. The pt stated she has not been to [facility] or any other out patient treatment for about 3 years. The pt has had several hospitalizations in the past.  The chart findings reviewed and dicussed with the treatment team. Patient seen. Becky [name] reports that she is having increased anxiety and "unstable mood".  Medications are reviewed, and she would like to restart Tegretol, as this helped best in past for mood stability.  She is denying SI/HI today, but endorses  AH, worsening depression and anxiety. She reports that the clonidine is not helping her withdrawal, and the gabapentin is for pain, and not helping her anxiety. She has tolerated Risperdal,  But still hearing voices some. She c/o right lower jaw and tooth pain related to broken tooth.  She remains on Keflex.   Reports decreased appetite and poor sleep. Becky [name] denies VH, delusional thoughts or paranoia, and does not appear to be responding to any internal stimuli. Patient is visible on the milieu.  Patient seen attending group sessions with engaged participation. Becky [name] has agreed to continue the current plan of care already in progress. She denies any other issues or concerns. Support encouragement reassurance was provided.  Principal Problem: Schizoaffective disorder (HCC) Diagnosis:   Patient Active Problem List   Diagnosis Date Noted  .  Schizoaffective disorder (HCC) [F25.9] 12/01/2017  . Alcohol abuse [F10.10] 05/26/2017  . Schizoaffective disorder, bipolar type (HCC) [F25.0] 05/26/2017  . Polysubstance abuse (HCC) [F19.10]   . Altered mental state [R41.82] 05/08/2017  . Hallucinations [R44.3]   . Bipolar disorder, curr episode mixed, severe, with psychotic features (HCC) [F31.64] 11/02/2016  . PTSD (post-traumatic stress disorder) [F43.10] 11/02/2016  . GAD (generalized anxiety disorder) [F41.1] 10/16/2016  . Opioid use disorder, moderate, dependence (HCC) [F11.20] 06/06/2016  . Sedative, hypnotic or anxiolytic use disorder, severe, dependence (HCC) [F13.20] 06/06/2016  . Cocaine use disorder, severe, dependence (HCC) [F14.20] 06/06/2016  . Cannabis use disorder, severe, dependence (HCC) [F12.20] 06/06/2016  . MDD (major depressive disorder), recurrent severe, without psychosis (HCC) [F33.2] 06/03/2016  . Bipolar I disorder, most recent episode depressed (HCC) [F31.30] 07/04/2015  . Overdose [T50.901A] 07/02/2015  . Lactic acidosis [E87.2] 07/02/2015  . Acute respiratory failure (HCC) [J96.00] 07/02/2015  . Sepsis (HCC) [A41.9] 07/02/2015  . AKI (acute kidney injury) (HCC) [N17.9]   . Altered mental status [R41.82]   . Pyrexia [R50.9]   . Abdominal pain, epigastric [R10.13] 06/17/2015  . Hepatitis C antibody test positive [R76.8] 06/17/2015  . Peptic ulcer disease [K27.9] 06/17/2015  . Epigastric pain [R10.13]   . Transaminitis [R74.0] 06/16/2015  . History of substance abuse [Z87.898] 06/16/2015   Total Time spent with patient: 35 min  Past Psychiatric History:  See H&P  Past Medical History:  Past Medical History:  Diagnosis Date  . Bipolar 1 disorder (HCC)   . Bronchitis   . GAD (generalized anxiety disorder)   . Hepatitis C antibody test  positive 06/17/2015  . History of substance abuse 06/16/2015  . Hypothyroidism   . OCD (obsessive compulsive disorder)   . Peptic ulcer   . Polysubstance abuse (HCC)    . PTSD (post-traumatic stress disorder)     Past Surgical History:  Procedure Laterality Date  . CESAREAN SECTION    . OTHER SURGICAL HISTORY     scar tissue removed from right ovary   . OTHER SURGICAL HISTORY     fallopian tube repair  . WISDOM TOOTH EXTRACTION     Family History:  Family History  Problem Relation Age of Onset  . Drug abuse Brother   . Suicidality Brother   . Cancer Other   . Depression Mother   . Drug abuse Mother   . Alcoholism Father   . Bipolar disorder Cousin    Family Psychiatric  History:  Social History:  Social History   Substance and Sexual Activity  Alcohol Use Yes   Comment: occasional use     Social History   Substance and Sexual Activity  Drug Use Yes  . Types: Marijuana, Methamphetamines, MDMA (Ecstacy), Oxycodone, Benzodiazepines, Cocaine, IV   Comment: uses heroin, crack and marijuana,  last used 10/04/2017    Social History   Socioeconomic History  . Marital status: Legally Separated    Spouse name: Not on file  . Number of children: Not on file  . Years of education: Not on file  . Highest education level: Not on file  Occupational History  . Not on file  Social Needs  . Financial resource strain: Not on file  . Food insecurity:    Worry: Not on file    Inability: Not on file  . Transportation needs:    Medical: Not on file    Non-medical: Not on file  Tobacco Use  . Smoking status: Current Every Day Smoker    Packs/day: 1.00    Years: 10.00    Pack years: 10.00    Types: Cigarettes  . Smokeless tobacco: Never Used  Substance and Sexual Activity  . Alcohol use: Yes    Comment: occasional use  . Drug use: Yes    Types: Marijuana, Methamphetamines, MDMA (Ecstacy), Oxycodone, Benzodiazepines, Cocaine, IV    Comment: uses heroin, crack and marijuana,  last used 10/04/2017  . Sexual activity: Yes    Birth control/protection: None  Lifestyle  . Physical activity:    Days per week: Not on file    Minutes per  session: Not on file  . Stress: Not on file  Relationships  . Social connections:    Talks on phone: Not on file    Gets together: Not on file    Attends religious service: Not on file    Active member of club or organization: Not on file    Attends meetings of clubs or organizations: Not on file    Relationship status: Not on file  Other Topics Concern  . Not on file  Social History Narrative  . Not on file   Additional Social History:           see H&P              Sleep: Fair  Appetite:  Fair  Current Medications: Current Facility-Administered Medications  Medication Dose Route Frequency Provider Last Rate Last Dose  . acetaminophen (TYLENOL) tablet 650 mg  650 mg Oral Q6H PRN Money, Gerlene Burdock, FNP   650 mg at 12/05/17 1437  . alum & mag hydroxide-simeth (MAALOX/MYLANTA)  200-200-20 MG/5ML suspension 30 mL  30 mL Oral Q4H PRN Money, Gerlene Burdock, FNP   30 mL at 12/04/17 1942  . carbamazepine (TEGRETOL) tablet 200 mg  200 mg Oral TID Mariel Craft, MD   200 mg at 12/05/17 1555  . cephALEXin (KEFLEX) capsule 500 mg  500 mg Oral Q8H Antonieta Pert, MD   500 mg at 12/05/17 1437  . cloNIDine (CATAPRES) tablet 0.1 mg  0.1 mg Oral QID Mariel Craft, MD   0.1 mg at 12/05/17 1156   Followed by  . [START ON 12/07/2017] cloNIDine (CATAPRES) tablet 0.1 mg  0.1 mg Oral BH-qamhs Mariel Craft, MD       Followed by  . [START ON 12/09/2017] cloNIDine (CATAPRES) tablet 0.1 mg  0.1 mg Oral QAC breakfast Mariel Craft, MD      . dicyclomine (BENTYL) tablet 20 mg  20 mg Oral Q6H PRN Mariel Craft, MD      . diphenhydrAMINE (BENADRYL) injection 50 mg  50 mg Intramuscular Once Antonieta Pert, MD      . FLUoxetine (PROZAC) capsule 40 mg  40 mg Oral Daily Mariel Craft, MD   40 mg at 12/05/17 0827  . gabapentin (NEURONTIN) capsule 600 mg  600 mg Oral TID Mariel Craft, MD   600 mg at 12/05/17 1156  . hydrOXYzine (ATARAX/VISTARIL) tablet 50 mg  50 mg Oral TID PRN Money,  Gerlene Burdock, FNP   50 mg at 12/03/17 1904  . loperamide (IMODIUM) capsule 4 mg  4 mg Oral Daily PRN Mariel Craft, MD   4 mg at 12/05/17 1555  . magnesium hydroxide (MILK OF MAGNESIA) suspension 30 mL  30 mL Oral Daily PRN Money, Gerlene Burdock, FNP      . methocarbamol (ROBAXIN) tablet 500 mg  500 mg Oral Q8H PRN Mariel Craft, MD      . naproxen (NAPROSYN) tablet 500 mg  500 mg Oral BID PRN Mariel Craft, MD      . nicotine polacrilex (NICORETTE) gum 2 mg  2 mg Oral PRN Donell Sievert E   2 mg at 12/05/17 1555  . ondansetron (ZOFRAN-ODT) disintegrating tablet 4 mg  4 mg Oral Q6H PRN Mariel Craft, MD      . risperiDONE (RISPERDAL) tablet 3 mg  3 mg Oral BH-qamhs Mariel Craft, MD      . traZODone (DESYREL) tablet 50 mg  50 mg Oral QHS,MR X 1 Kerry Hough, PA-C   50 mg at 12/04/17 2058    Lab Results:  Results for orders placed or performed during the hospital encounter of 12/01/17 (from the past 48 hour(s))  TSH     Status: None   Collection Time: 12/03/17  7:03 PM  Result Value Ref Range   TSH 1.028 0.350 - 4.500 uIU/mL    Comment: Performed by a 3rd Generation assay with a functional sensitivity of <=0.01 uIU/mL. Performed at [facility], [address] [place], [place] [address]     Blood Alcohol level:  Lab Results  Component Value Date   [facility] <10 12/01/2017   ETH <10 10/13/2017    Metabolic Disorder Labs: Lab Results  Component Value Date   HGBA1C 5.3 05/27/2017   MPG 105.41 05/27/2017   MPG 105 10/16/2016   Lab Results  Component Value Date   PROLACTIN 8.8 10/16/2016   PROLACTIN 49.9 (H) 06/07/2016   Lab Results  Component Value Date  CHOL 182 05/27/2017   TRIG 254 (H) 05/27/2017   HDL 49 05/27/2017   CHOLHDL 3.7 05/27/2017   VLDL 51 (H) 05/27/2017   LDLCALC 82 05/27/2017   LDLCALC 75 10/16/2016    Physical Findings: AIMS: Facial and Oral Movements Muscles of Facial Expression: None, normal Lips and Perioral Area: None,  normal Jaw: None, normal Tongue: None, normal,Extremity Movements Upper (arms, wrists, hands, fingers): None, normal Lower (legs, knees, ankles, toes): None, normal, Trunk Movements Neck, shoulders, hips: None, normal, Overall Severity Severity of abnormal movements (highest score from questions above): None, normal Incapacitation due to abnormal movements: None, normal Patient's awareness of abnormal movements (rate only patient's report): No Awareness, Dental Status Current problems with teeth and/or dentures?: No Does patient usually wear dentures?: No  CIWA:  CIWA-Ar Total: 0 COWS:  COWS Total Score: 2  Musculoskeletal: Strength & Muscle Tone: within normal limits Gait & Station: normal Patient leans: N/A  Psychiatric Specialty Exam: Physical Exam  Nursing note and vitals reviewed. Constitutional: She is oriented to person, place, and time. She appears well-developed and well-nourished. No distress.  Musculoskeletal: Normal range of motion.  Neurological: She is alert and oriented to person, place, and time.  Psychiatric:  Depressed mood, and flat affect     Review of Systems  Constitutional: Negative for fever.  HENT:       Right lower molar broken at gum line with swelling of surrounding gum tissue.   Musculoskeletal: Positive for myalgias.  Psychiatric/Behavioral: Positive for depression and substance abuse. The patient is nervous/anxious.   All other systems reviewed and are negative.   Blood pressure 106/78, pulse 90, temperature 97.9 F (36.6 C), resp. rate 20, height 5\' 2"  (1.575 m), weight 80.3 kg (177 lb).Body mass index is 32.37 kg/m.  General Appearance: Casual and Disheveled  Eye Contact:  Poor  Speech:  Clear and Coherent  Volume:  Decreased  Mood:  Anxious and Depressed  Affect:  Congruent, Non-Congruent, Depressed and Flat  Thought Process:  Coherent  Orientation:  Full (Time, Place, and Person)  Thought Content:  Hallucinations: None  Suicidal  Thoughts:  No  Homicidal Thoughts:  No  Memory:  Immediate;   Fair Recent;   Fair Remote;   Fair  Judgement:  Fair  Insight:  Fair  Psychomotor Activity:  Normal  Concentration:  Concentration: Fair  Recall:  [employer] of Knowledge:  Fair  Language:  Fair  Akathisia:  No  Handed:  Right  AIMS (if indicated):     Assets:  Communication Skills Desire for Improvement Resilience Social Support  ADL's:  Intact  Cognition:  WNL  Sleep:  Number of Hours: 6.75     Treatment Plan Summary: Daily contact with patient to assess and evaluate symptoms and progress in treatment and Medication management   Continue with current treatment plan on 12/03/2017 as listed below except were noted   Continue Prozac 40 mg QD Continue Gabapentin 600 mg TID Restart Tegretol 200 mg TID  Change Risperdal 3 mg BID for AH and mood stabilization with plan to convert to LAI. Cogentin 0.5 mg BID with Risperdal to prevent EPS. Continue with Trazodone 50 mg for insomnia COWS and Clonidine Protocol for withdrawal   Will continue to monitor vitals ,medication compliance and treatment side effects while patient is here.  Reviewed labs: AST/ALT 63/140 hx of hep c infection ,BAL - , UDS -THC and benzodiazepines. CSW will continue working on discharge disposition.  Patient to participate in therapeutic milieu  Will require dental/oral surgeon referral for dental abscess of broken tooth.   Mariel CraftSHEILA M Emmett Arntz, MD 12/05/2017, 4:48 PM

## 2017-12-05 NOTE — Progress Notes (Signed)
Recreation Therapy Notes  Date: 7.23.19 Time: 1000 Location: 500 Hall Dayroom  Group Topic: Communication  Goal Area(s) Addresses:  Patient will effectively communicate with peers in group.  Patient will verbalize benefit of healthy communication. Patient will verbalize positive effect of healthy communication on post d/c goals.  Patient will identify communication techniques that made activity effective for group.   Intervention: Drawings, pencils, blank paper  Activity: Geometrical Drawings.  Patients were put into groups of 2.  Each person would take turns being the listener and the speaker.  The speaker will describe the picture to their partner.  The listener is to draw the picture as it is described to them.  However, the listener can not ask any questions besides "can you repeat that".  Partners would then switch roles and get a different picture.  Education: Communication, Discharge Planning  Education Outcome: Acknowledges understanding/In group clarification offered/Needs additional education.   Clinical Observations/Feedback: Pt did not attend group.   Kimley Apsey, LRT/CTRS         Salam Micucci A 12/05/2017 12:10 PM 

## 2017-12-05 NOTE — Plan of Care (Signed)
  Problem: Activity: Goal: Interest or engagement in activities will improve Outcome: Progressing   Problem: Safety: Goal: Periods of time without injury will increase Outcome: Progressing   Problem: Medication: Goal: Compliance with prescribed medication regimen will improve Outcome: Progressing  DAR NOTE: Patient presents with anxious affect and depressed mood.  Denies suicidal thoughts, auditory and visual hallucinations.  Reports withdrawal symptoms of tremors, diarrhea, cramping and irritability.  Rates depression at 7, hopelessness at 7, and anxiety at 10.  Maintained on routine safety checks.  Medications given as prescribed.  Support and encouragement offered as needed.  Attended group and participated.  Patient observed socializing with peers in the dayroom.

## 2017-12-05 NOTE — Progress Notes (Signed)
Adult Psychoeducational Group Note  Date:  12/05/2017 Time:  9:17 PM  Group Topic/Focus:  Wrap-Up Group:   The focus of this group is to help patients review their daily goal of treatment and discuss progress on daily workbooks.  Participation Level:  Active  Participation Quality:  Appropriate  Affect:  Appropriate  Cognitive:  Appropriate  Insight: Appropriate  Engagement in Group:  Engaged  Modes of Intervention:  Discussion  Additional Comments: The patient expressed that she rates today a 8 .  Octavio Mannshigpen, Alexxa Sabet Lee 12/05/2017, 9:17 PM

## 2017-12-05 NOTE — [facility] Group Notes (Signed)
LCSW Group Therapy Notes 12/05/2017 1:15pm Type of Therapy and Topic:  Group Therapy:  Communication Participation Level:  None  Description of Group: Patients will identify how individuals communicate with one another appropriately and inappropriately.  Patients will be guided to discuss their thoughts, feelings and behaviors related to barriers when communicating.  The group will process together ways to execute positive and appropriate communication with attention given to how one uses behavior, tone and body language.  Patients will be encouraged to reflect on a situation where they were successfully able to communicate and what made this example successful.  Group will identify specific changes they are motivated to make in order to overcome communication barriers with self, peers, authority, and parents.  This group will be process-oriented with patients participating in exploration of their own experiences, giving and receiving support, and challenging self and other group members.   Therapeutic Goals 1. Patient will identify how people communicate (body language, facial expression, and electronics).  Group will also discuss tone, voice and how these impact what is communicated and what is received. 2. Patient will identify feelings (such as fear or worry), thought process and behaviors related to why people internalize feelings rather than express self openly. 3. Patient will identify two changes they are willing to make to overcome communication barriers 4. Members will then practice through role play how to communicate using I statements, I feel statements, and acknowledging feelings rather than displacing feelings on others Summary of Patient Progress:  Made a brief appearance towards the end. Left soon thereafter.  Therapeutic Modalities Cognitive Behavioral Therapy Motivational Interviewing Solution Focused Therapy  Ida RogueRodney B Eyva Califano, LCSW 12/05/2017 1:40 PM

## 2017-12-05 NOTE — [facility] Suicide Risk Assessment (Signed)
[facility] INPATIENT:  Family/Significant Other Suicide Prevention Education  Suicide Prevention Education:  Patient Refusal for Family/Significant Other Suicide Prevention Education: The patient Becky PancoastDanielle [name] has refused to provide written consent for family/significant other to be provided Family/Significant Other Suicide Prevention Education during admission and/or prior to discharge.  Physician notified.  Becky DaubRodney [name] [facility] 12/05/2017, 2:34 PM

## 2017-12-06 MED ORDER — RISPERIDONE MICROSPHERES 25 MG IM SUSR
25.0000 mg | INTRAMUSCULAR | Status: DC
  Administered 2017-12-06: 25 mg via INTRAMUSCULAR
  Filled 2017-12-06 (×2): qty 2

## 2017-12-06 MED ORDER — MELATONIN 3 MG PO TABS
3.0000 mg | ORAL_TABLET | Freq: Every day | ORAL | Status: DC
  Administered 2017-12-06 – 2017-12-07 (×2): 3 mg via ORAL
  Filled 2017-12-06: qty 1
  Filled 2017-12-06 (×2): qty 7
  Filled 2017-12-06 (×2): qty 1

## 2017-12-06 MED ORDER — NON FORMULARY: Freq: Every day | Status: DC

## 2017-12-06 NOTE — [facility] Group Notes (Signed)
LCSW Group Therapy Note  12/06/2017 1:15pm  Type of Therapy/Topic:  Group Therapy:  Emotion Regulation  Participation Level:  Minimal   Description of Group:   The purpose of this group is to assist patients in learning to regulate negative emotions and experience positive emotions. Patients will be guided to discuss ways in which they have been vulnerable to their negative emotions. These vulnerabilities will be juxtaposed with experiences of positive emotions or situations, and patients will be challenged to use positive emotions to combat negative ones. Special emphasis will be placed on coping with negative emotions in conflict situations, and patients will process healthy conflict resolution skills.  Therapeutic Goals: 1. Patient will identify two positive emotions or experiences to reflect on in order to balance out negative emotions 2. Patient will label two or more emotions that they find the most difficult to experience 3. Patient will demonstrate positive conflict resolution skills through discussion and/or role plays  Summary of Patient Progress:  Per usual, Becky [name] made several brief appearances. No significant contributions to the discussion.     Therapeutic Modalities:   Cognitive Behavioral Therapy Feelings Identification Dialectical Behavioral Therapy   Ida RogueRodney B Silvia Markuson, LCSW 12/06/2017 11:40 AM

## 2017-12-06 NOTE — Progress Notes (Signed)
[facility] MD Progress Note  12/06/2017 4:14 PM Becky [name]  MRN:  [id number]   GMW:[id number] a 27 y.o.female.The pt came in under IVC after taking 10-12 Tegretol pills in a suicide attempt. The pt is vague about stating her major stressors. When asked about her stressors she stated "life". She later stated she is having financial problems. The pt was discharged from [facility] three weeks ago for SI. She stated she wants to get back on medication. She reported she had a prescription after leaving [facility], but did not have the money to get the prescription filled. The pt stated she has not been to [facility] or any other out patient treatment for about 3 years. The pt has had several hospitalizations in the past.  The chart findings reviewed and dicussed with the treatment team. Patient seen. Becky [name] reports that she is "feeling a little better with adding back Tegretol".  She states that she "will never overdose on Tegretol again because I lost all muscle and nerve control."  Patient reports that she does not want to be in a suboxone or methadone clinic because of cost and symptoms of withdrawal similar to coming off of heroin.  She reports that she intends to go to an IOP and get a sponsor after discharge. She is able to relate support system of friends and family she can talk to if she is craving opiates/heroin. Patient tolerated increased dose of Risperdal with slight decrease in AH, but "it did not make me sleepy, and trazodone is not working".  She requests a sleep aid. She is denying SI/HI and VH today.  Reports decreased appetite and poor sleep. Becky [name] denies delusional thoughts or paranoia, and does not appear to be responding to any internal stimuli. Patient is visible on the milieu.  Patient seen attending group sessions with engaged participation. Becky [name] has agreed to continue the current plan of care already in progress. She denies any other issues or  concerns. Support encouragement reassurance was provided.  Principal Problem: Schizoaffective disorder (HCC) Diagnosis:   Patient Active Problem List   Diagnosis Date Noted  . Schizoaffective disorder (HCC) [F25.9] 12/01/2017  . Alcohol abuse [F10.10] 05/26/2017  . Schizoaffective disorder, bipolar type (HCC) [F25.0] 05/26/2017  . Polysubstance abuse (HCC) [F19.10]   . Altered mental state [R41.82] 05/08/2017  . Hallucinations [R44.3]   . Bipolar disorder, curr episode mixed, severe, with psychotic features (HCC) [F31.64] 11/02/2016  . PTSD (post-traumatic stress disorder) [F43.10] 11/02/2016  . GAD (generalized anxiety disorder) [F41.1] 10/16/2016  . Opioid use disorder, moderate, dependence (HCC) [F11.20] 06/06/2016  . Sedative, hypnotic or anxiolytic use disorder, severe, dependence (HCC) [F13.20] 06/06/2016  . Cocaine use disorder, severe, dependence (HCC) [F14.20] 06/06/2016  . Cannabis use disorder, severe, dependence (HCC) [F12.20] 06/06/2016  . MDD (major depressive disorder), recurrent severe, without psychosis (HCC) [F33.2] 06/03/2016  . Bipolar I disorder, most recent episode depressed (HCC) [F31.30] 07/04/2015  . Overdose [T50.901A] 07/02/2015  . Lactic acidosis [E87.2] 07/02/2015  . Acute respiratory failure (HCC) [J96.00] 07/02/2015  . Sepsis (HCC) [A41.9] 07/02/2015  . AKI (acute kidney injury) (HCC) [N17.9]   . Altered mental status [R41.82]   . Pyrexia [R50.9]   . Abdominal pain, epigastric [R10.13] 06/17/2015  . Hepatitis C antibody test positive [R76.8] 06/17/2015  . Peptic ulcer disease [K27.9] 06/17/2015  . Epigastric pain [R10.13]   . Transaminitis [R74.0] 06/16/2015  . History of substance abuse [Z87.898] 06/16/2015   Total Time spent with patient: 35 min  Past Psychiatric  History:  See H&P  Past Medical History:  Past Medical History:  Diagnosis Date  . Bipolar 1 disorder (HCC)   . Bronchitis   . GAD (generalized anxiety disorder)   . Hepatitis C  antibody test positive 06/17/2015  . History of substance abuse 06/16/2015  . Hypothyroidism   . OCD (obsessive compulsive disorder)   . Peptic ulcer   . Polysubstance abuse (HCC)   . PTSD (post-traumatic stress disorder)     Past Surgical History:  Procedure Laterality Date  . CESAREAN SECTION    . OTHER SURGICAL HISTORY     scar tissue removed from right ovary   . OTHER SURGICAL HISTORY     fallopian tube repair  . WISDOM TOOTH EXTRACTION     Family History:  Family History  Problem Relation Age of Onset  . Drug abuse Brother   . Suicidality Brother   . Cancer Other   . Depression Mother   . Drug abuse Mother   . Alcoholism Father   . Bipolar disorder Cousin    Family Psychiatric  History:  Social History:  Social History   Substance and Sexual Activity  Alcohol Use Yes   Comment: occasional use     Social History   Substance and Sexual Activity  Drug Use Yes  . Types: Marijuana, Methamphetamines, MDMA (Ecstacy), Oxycodone, Benzodiazepines, Cocaine, IV   Comment: uses heroin, crack and marijuana,  last used 10/04/2017    Social History   Socioeconomic History  . Marital status: Legally Separated    Spouse name: Not on file  . Number of children: Not on file  . Years of education: Not on file  . Highest education level: Not on file  Occupational History  . Not on file  Social Needs  . Financial resource strain: Not on file  . Food insecurity:    Worry: Not on file    Inability: Not on file  . Transportation needs:    Medical: Not on file    Non-medical: Not on file  Tobacco Use  . Smoking status: Current Every Day Smoker    Packs/day: 1.00    Years: 10.00    Pack years: 10.00    Types: Cigarettes  . Smokeless tobacco: Never Used  Substance and Sexual Activity  . Alcohol use: Yes    Comment: occasional use  . Drug use: Yes    Types: Marijuana, Methamphetamines, MDMA (Ecstacy), Oxycodone, Benzodiazepines, Cocaine, IV    Comment: uses heroin, crack  and marijuana,  last used 10/04/2017  . Sexual activity: Yes    Birth control/protection: None  Lifestyle  . Physical activity:    Days per week: Not on file    Minutes per session: Not on file  . Stress: Not on file  Relationships  . Social connections:    Talks on phone: Not on file    Gets together: Not on file    Attends religious service: Not on file    Active member of club or organization: Not on file    Attends meetings of clubs or organizations: Not on file    Relationship status: Not on file  Other Topics Concern  . Not on file  Social History Narrative  . Not on file   Additional Social History:           see H&P              Sleep: Fair  Appetite:  Fair  Current Medications: Current Facility-Administered Medications  Medication  Dose Route Frequency Provider Last Rate Last Dose  . acetaminophen (TYLENOL) tablet 650 mg  650 mg Oral Q6H PRN Money, Gerlene Burdock, FNP   650 mg at 12/05/17 1437  . alum & mag hydroxide-simeth (MAALOX/MYLANTA) 200-200-20 MG/5ML suspension 30 mL  30 mL Oral Q4H PRN Money, [name] B, FNP   30 mL at 12/06/17 0944  . carbamazepine (TEGRETOL) tablet 200 mg  200 mg Oral TID Mariel Craft, MD   200 mg at 12/06/17 1115  . cephALEXin (KEFLEX) capsule 500 mg  500 mg Oral Q8H Antonieta Pert, MD   500 mg at 12/06/17 1500  . cloNIDine (CATAPRES) tablet 0.1 mg  0.1 mg Oral QID Mariel Craft, MD   0.1 mg at 12/06/17 1115   Followed by  . [START ON 12/07/2017] cloNIDine (CATAPRES) tablet 0.1 mg  0.1 mg Oral BH-qamhs Mariel Craft, MD       Followed by  . [START ON 12/09/2017] cloNIDine (CATAPRES) tablet 0.1 mg  0.1 mg Oral QAC breakfast Mariel Craft, MD      . dicyclomine (BENTYL) tablet 20 mg  20 mg Oral Q6H PRN Mariel Craft, MD      . diphenhydrAMINE (BENADRYL) injection 50 mg  50 mg Intramuscular Once Antonieta Pert, MD      . FLUoxetine (PROZAC) capsule 40 mg  40 mg Oral Daily Mariel Craft, MD   40 mg at 12/06/17 0940   . gabapentin (NEURONTIN) capsule 600 mg  600 mg Oral TID Mariel Craft, MD   600 mg at 12/06/17 1115  . hydrOXYzine (ATARAX/VISTARIL) tablet 50 mg  50 mg Oral TID PRN Money, Gerlene Burdock, FNP   50 mg at 12/03/17 1904  . loperamide (IMODIUM) capsule 4 mg  4 mg Oral Daily PRN Mariel Craft, MD   4 mg at 12/05/17 1555  . magnesium hydroxide (MILK OF MAGNESIA) suspension 30 mL  30 mL Oral Daily PRN Money, Gerlene Burdock, FNP      . Melatonin TABS 3 mg  3 mg Oral q1800 Antonieta Pert, MD      . methocarbamol (ROBAXIN) tablet 500 mg  500 mg Oral Q8H PRN Mariel Craft, MD      . naproxen (NAPROSYN) tablet 500 mg  500 mg Oral BID PRN Mariel Craft, MD      . nicotine polacrilex (NICORETTE) gum 2 mg  2 mg Oral PRN Donell Sievert E   2 mg at 12/06/17 0945  . ondansetron (ZOFRAN-ODT) disintegrating tablet 4 mg  4 mg Oral Q6H PRN Mariel Craft, MD      . risperiDONE (RISPERDAL) tablet 3 mg  3 mg Oral BH-qamhs Mariel Craft, MD   3 mg at 12/06/17 1308  . traZODone (DESYREL) tablet 50 mg  50 mg Oral QHS,MR X 1 Kerry Hough, PA-C   50 mg at 12/05/17 2112    Lab Results:  No results found for this or any previous visit (from the past 48 hour(s)).  Blood Alcohol level:  Lab Results  Component Value Date   ETH <10 12/01/2017   ETH <10 10/13/2017    Metabolic Disorder Labs: Lab Results  Component Value Date   HGBA1C 5.3 05/27/2017   MPG 105.41 05/27/2017   MPG 105 10/16/2016   Lab Results  Component Value Date   PROLACTIN 8.8 10/16/2016   PROLACTIN 49.9 (H) 06/07/2016   Lab Results  Component Value Date   CHOL 182 05/27/2017  TRIG 254 (H) 05/27/2017   HDL 49 05/27/2017   CHOLHDL 3.7 05/27/2017   VLDL 51 (H) 05/27/2017   LDLCALC 82 05/27/2017   LDLCALC 75 10/16/2016    Physical Findings: AIMS: Facial and Oral Movements Muscles of Facial Expression: None, normal Lips and Perioral Area: None, normal Jaw: None, normal Tongue: None, normal,Extremity Movements Upper  (arms, wrists, hands, fingers): None, normal Lower (legs, knees, ankles, toes): None, normal, Trunk Movements Neck, shoulders, hips: None, normal, Overall Severity Severity of abnormal movements (highest score from questions above): None, normal Incapacitation due to abnormal movements: None, normal Patient's awareness of abnormal movements (rate only patient's report): No Awareness, Dental Status Current problems with teeth and/or dentures?: No Does patient usually wear dentures?: No  CIWA:  CIWA-Ar Total: 0 COWS:  COWS Total Score: 0  Musculoskeletal: Strength & Muscle Tone: within normal limits Gait & Station: normal Patient leans: N/A  Psychiatric Specialty Exam: Physical Exam  Nursing note and vitals reviewed. Constitutional: She is oriented to person, place, and time. She appears well-developed and well-nourished. No distress.  Musculoskeletal: Normal range of motion.  Neurological: She is alert and oriented to person, place, and time.  Psychiatric:  Depressed mood, and flat affect     Review of Systems  Constitutional: Negative for fever.  HENT:       Right lower molar broken at gum line with swelling of surrounding gum tissue.   Musculoskeletal: Positive for myalgias.  Psychiatric/Behavioral: Positive for depression and substance abuse. The patient is nervous/anxious.   All other systems reviewed and are negative.   Blood pressure 119/79, pulse 93, temperature (!) 97.5 F (36.4 C), temperature source Oral, resp. rate 18, height 5\' 2"  (1.575 m), weight 80.3 kg (177 lb).Body mass index is 32.37 kg/m.  General Appearance: Casual and Disheveled  Eye Contact:  Poor  Speech:  Clear and Coherent  Volume:  Decreased  Mood:  dysthymic  Affect:  Congruent and Flat  Thought Process:  Coherent, Linear and Descriptions of Associations: Intact  Orientation:  Full (Time, Place, and Person)  Thought Content:  Hallucinations: None  Suicidal Thoughts:  No  Homicidal Thoughts:  No   Memory:  Immediate;   Fair Recent;   Fair Remote;   Fair  Judgement:  Fair  Insight:  Fair  Psychomotor Activity:  Normal  Concentration:  Concentration: Fair  Recall:  [employer] of Knowledge:  Fair  Language:  Fair  Akathisia:  No  Handed:  Right  AIMS (if indicated):     Assets:  Communication Skills Desire for Improvement Resilience Social Support  ADL's:  Intact  Cognition:  WNL  Sleep:  Number of Hours: 6.75     Treatment Plan Summary: Daily contact with patient to assess and evaluate symptoms and progress in treatment and Medication management   Continue with current treatment plan on 12/03/2017 as listed below except were noted  Continue Prozac 40 mg QD Continue Gabapentin 600 mg TID Continue Tegretol 200 mg TID  Continue Risperdal 3 mg BID for AH and mood stabilization with plan to convert to LAI:    Risperdal Consta 25 mg IM 12/06/2017  Cogentin 0.5 mg BID with Risperdal to prevent EPS. Continue with Trazodone 50 mg for insomnia COWS and Clonidine Protocol for withdrawal   Will continue to monitor vitals ,medication compliance and treatment side effects while patient is here.  Reviewed labs: AST/ALT 63/140 hx of hep c infection ,BAL - , UDS -THC and benzodiazepines. CSW will continue working on  discharge disposition.  Patient to participate in therapeutic milieu  Will require dental/oral surgeon referral for dental abscess of broken tooth.   Mariel CraftSHEILA M MAURER, MD 12/06/2017, 4:14 PM

## 2017-12-06 NOTE — [facility] Group Notes (Signed)
[facility] Group Notes:  (Nursing/MHT/Case Management/Adjunct)  Date:  12/06/2017  Time:  7:04 PM  Type of Therapy:  Psychoeducational Skills  Participation Level:  Did Not Attend  Participation Quality:  DID NOT ATTEND  Affect:  DID NOT ATTEND  Cognitive:  DID NOT ATTEND  Insight:  None  Engagement in Group:  DID NOT ATTEND  Modes of Intervention:  DID NOT ATTEND  Summary of Progress/Problems: Pt did not attend patient self inventory group.    Aveleen Nevers O Krimson Massmann 12/06/2017, 7:04 PM 

## 2017-12-06 NOTE — Progress Notes (Signed)
Pt was observed in the dayroom, attending wrap-up group. Pt appears flat in affect and mood. Pt denies SI/HI/AVH/Pain at this time. Pt c/o of worsening indigestion. Pt was less irritable this evening. Pt remains preocuppied with somatic c/o's. Pt states she had an "okay" day. Pt states she wanted to take her medications early so she can go to sleep. PRN bentyl, vistaril, and trazodone with repeat requested and given. Support and encouragement offered. Encourage to push fluids. Will continue with POC.

## 2017-12-06 NOTE — [facility] Group Notes (Signed)
Adult Psychoeducational Group Note  Date:  12/06/2017 Time:  9:32 PM  Group Topic/Focus:  Wrap-Up Group:   The focus of this group is to help patients review their daily goal of treatment and discuss progress on daily workbooks.  Participation Level:  Active  Participation Quality:  Appropriate and Attentive  Affect:  Appropriate  Cognitive:  Alert and Appropriate  Insight: Appropriate and Good  Engagement in Group:  Engaged  Modes of Intervention:  Discussion and Education  Additional Comments:  Pt attended and participated in wrap up group this evening. Pt rated their day a 8/10, due to them speaking to their mother for the first time in weeks. Pt completed their goal, of getting out of the bed, then they went outside with their hall mates.   Becky NettersOctavia A Wanell [name] 12/06/2017, 9:32 PM

## 2017-12-06 NOTE — [facility] Group Notes (Signed)
Patient did not attend orientation and goals group.  

## 2017-12-06 NOTE — Progress Notes (Signed)
Patient ID: Becky PancoastDanielle Perazzo, female   DOB: 05/30/1990, 27 y.o.   MRN: [id number] DAR note: Pt observed in dayroom interacting with peers. Pt with blank and depressed affect endorsed moderate anxiety, depression and AH, "They keep telling me things I already know about myself." Pt was med compliant. All patient's questions and concerns addressed. 15-minute safety checks continue. Safety checks continue. Pt attended wrap-up group.

## 2017-12-06 NOTE — Progress Notes (Signed)
Recreation Therapy Notes  Date: 7.24.19 Time: 1000 Location: 500 Hall Dayroom  Group Topic: Goal Setting  Goal Area(s) Addresses:  Patient will be able to identify at least 3 life goals.  Patient will be able to identify benefit of investing in life goals.  Patient will be able to identify benefit of setting life goals.   Intervention: Worksheet  Activity: Goal Planning.  Patients were to identify goals they wanted to accomplish in a week, a month, in a year and in 5 years.  Patients were then to identify obstacles to reaching their goals, what they need to achieve their goals and what they can start doing immediately to work towards their goals.  Education:  Discharge Planning, Coping Skills, Leisure Education   Education Outcome: Acknowledges Education/In Group Clarification Provided/Needs Additional Education  Clinical Observations:   Pt did not attend group.    Becky [name], LRT/CTRS         Corneluis Allston A 12/06/2017 11:55 AM 

## 2017-12-06 NOTE — Progress Notes (Signed)
D: Pt continues to be somatic with her demands for medications. Reports increased anxiety when assessed, observed pacing in milieu. Visible in milieu at intervals during groups and engaged minimally. Denies SI, HI, AVH and pain at this time.  A: Scheduled and PRN medications given as ordered with verbal education and effects monitored. Safety checks maintained. Emotional support and encouragement provided.  R: Pt is receptive to care. Tolerates all PO intake well. Remains safe on and off unit.

## 2017-12-07 MED ORDER — RISPERIDONE 3 MG PO TABS
3.0000 mg | ORAL_TABLET | Freq: Every day | ORAL | Status: DC
  Filled 2017-12-07 (×2): qty 7
  Filled 2017-12-07: qty 1

## 2017-12-07 MED ORDER — CLONIDINE HCL 0.1 MG PO TABS: 0.1000 mg | ORAL_TABLET | Freq: Two times a day (BID) | ORAL | Status: DC | PRN

## 2017-12-07 NOTE — Progress Notes (Signed)
Adult Psychoeducational Group Note  Date:  12/07/2017 Time:  8:59 PM  Group Topic/Focus:  Wrap-Up Group:   The focus of this group is to help patients review their daily goal of treatment and discuss progress on daily workbooks.  Participation Level:  Active  Participation Quality:  Appropriate  Affect:  Appropriate  Cognitive:  Appropriate  Insight: Appropriate  Engagement in Group:  Engaged  Modes of Intervention:  Discussion  Additional Comments:  The patient expressed that she rates today a 8.The patient also said that she attended group and reached her goal to stay awake.   Octavio Mannshigpen, Becky [name] 12/07/2017, 8:59 PM

## 2017-12-07 NOTE — Progress Notes (Signed)
[facility] MD Progress Note  12/07/2017 4:07 PM Becky [name]  MRN:  [id number]   YQM:[id number] a 27 y.o.female.The pt came in under IVC after taking 10-12 Tegretol pills in a suicide attempt. The pt is vague about stating her major stressors. When asked about her stressors she stated "life". She later stated she is having financial problems. The pt was discharged from [facility] three weeks ago for SI. She stated she wants to get back on medication. She reported she had a prescription after leaving [facility], but did not have the money to get the prescription filled. The pt stated she has not been to [facility] or any other out patient treatment for about 3 years. The pt has had several hospitalizations in the past.  The chart findings reviewed and dicussed with the treatment team. Patient seen. Becky [name] reports that she is "feeling anxious about going home" and describes passive SI because of the anxiety.  She denies having a plan for suicide and denies intent.  She recognizes that she needs to remain under IVC for transportation home.  She relates that she will be staying with her aunt and will not have access to medications for overdose. She reports that she intends to go to an IOP and get a sponsor after discharge. She is able to relate support system of friends and family she can talk to if she is craving opiates/heroin. Patient tolerated increased dose of Risperdal and denies AVH, and HI today. Reports adequate appetite and sleep. Becky [name] denies delusional thoughts or paranoia, and does not appear to be responding to any internal stimuli. Patient is visible on the milieu.  Patient seen attending group sessions with engaged participation. Becky [name] agreed to continue the current plan of care already in progress. She denies any other issues or concerns. Support encouragement reassurance was provided.  Principal Problem: Schizoaffective disorder (HCC) Diagnosis:    Patient Active Problem List   Diagnosis Date Noted  . Schizoaffective disorder (HCC) [F25.9] 12/01/2017  . Alcohol abuse [F10.10] 05/26/2017  . Schizoaffective disorder, bipolar type (HCC) [F25.0] 05/26/2017  . Polysubstance abuse (HCC) [F19.10]   . Altered mental state [R41.82] 05/08/2017  . Hallucinations [R44.3]   . Bipolar disorder, curr episode mixed, severe, with psychotic features (HCC) [F31.64] 11/02/2016  . PTSD (post-traumatic stress disorder) [F43.10] 11/02/2016  . GAD (generalized anxiety disorder) [F41.1] 10/16/2016  . Opioid use disorder, moderate, dependence (HCC) [F11.20] 06/06/2016  . Sedative, hypnotic or anxiolytic use disorder, severe, dependence (HCC) [F13.20] 06/06/2016  . Cocaine use disorder, severe, dependence (HCC) [F14.20] 06/06/2016  . Cannabis use disorder, severe, dependence (HCC) [F12.20] 06/06/2016  . MDD (major depressive disorder), recurrent severe, without psychosis (HCC) [F33.2] 06/03/2016  . Bipolar I disorder, most recent episode depressed (HCC) [F31.30] 07/04/2015  . Overdose [T50.901A] 07/02/2015  . Lactic acidosis [E87.2] 07/02/2015  . Acute respiratory failure (HCC) [J96.00] 07/02/2015  . Sepsis (HCC) [A41.9] 07/02/2015  . AKI (acute kidney injury) (HCC) [N17.9]   . Altered mental status [R41.82]   . Pyrexia [R50.9]   . Abdominal pain, epigastric [R10.13] 06/17/2015  . Hepatitis C antibody test positive [R76.8] 06/17/2015  . Peptic ulcer disease [K27.9] 06/17/2015  . Epigastric pain [R10.13]   . Transaminitis [R74.0] 06/16/2015  . History of substance abuse [Z87.898] 06/16/2015   Total Time spent with patient: 35 min  Past Psychiatric History:  See H&P  Past Medical History:  Past Medical History:  Diagnosis Date  . Bipolar 1 disorder (HCC)   . Bronchitis   .  GAD (generalized anxiety disorder)   . Hepatitis C antibody test positive 06/17/2015  . History of substance abuse 06/16/2015  . Hypothyroidism   . OCD (obsessive compulsive  disorder)   . Peptic ulcer   . Polysubstance abuse (HCC)   . PTSD (post-traumatic stress disorder)     Past Surgical History:  Procedure Laterality Date  . CESAREAN SECTION    . OTHER SURGICAL HISTORY     scar tissue removed from right ovary   . OTHER SURGICAL HISTORY     fallopian tube repair  . WISDOM TOOTH EXTRACTION     Family History:  Family History  Problem Relation Age of Onset  . Drug abuse Brother   . Suicidality Brother   . Cancer Other   . Depression Mother   . Drug abuse Mother   . Alcoholism Father   . Bipolar disorder Cousin    Family Psychiatric  History:  Social History:  Social History   Substance and Sexual Activity  Alcohol Use Yes   Comment: occasional use     Social History   Substance and Sexual Activity  Drug Use Yes  . Types: Marijuana, Methamphetamines, MDMA (Ecstacy), Oxycodone, Benzodiazepines, Cocaine, IV   Comment: uses heroin, crack and marijuana,  last used 10/04/2017    Social History   Socioeconomic History  . Marital status: Legally Separated    Spouse name: Not on file  . Number of children: Not on file  . Years of education: Not on file  . Highest education level: Not on file  Occupational History  . Not on file  Social Needs  . Financial resource strain: Not on file  . Food insecurity:    Worry: Not on file    Inability: Not on file  . Transportation needs:    Medical: Not on file    Non-medical: Not on file  Tobacco Use  . Smoking status: Current Every Day Smoker    Packs/day: 1.00    Years: 10.00    Pack years: 10.00    Types: Cigarettes  . Smokeless tobacco: Never Used  Substance and Sexual Activity  . Alcohol use: Yes    Comment: occasional use  . Drug use: Yes    Types: Marijuana, Methamphetamines, MDMA (Ecstacy), Oxycodone, Benzodiazepines, Cocaine, IV    Comment: uses heroin, crack and marijuana,  last used 10/04/2017  . Sexual activity: Yes    Birth control/protection: None  Lifestyle  . Physical  activity:    Days per week: Not on file    Minutes per session: Not on file  . Stress: Not on file  Relationships  . Social connections:    Talks on phone: Not on file    Gets together: Not on file    Attends religious service: Not on file    Active member of club or organization: Not on file    Attends meetings of clubs or organizations: Not on file    Relationship status: Not on file  Other Topics Concern  . Not on file  Social History Narrative  . Not on file   Additional Social History:           see H&P              Sleep: Fair  Appetite:  Fair  Current Medications: Current Facility-Administered Medications  Medication Dose Route Frequency Provider Last Rate Last Dose  . acetaminophen (TYLENOL) tablet 650 mg  650 mg Oral Q6H PRN Money, Gerlene Burdock, FNP   650  mg at 12/07/17 1139  . alum & mag hydroxide-simeth (MAALOX/MYLANTA) 200-200-20 MG/5ML suspension 30 mL  30 mL Oral Q4H PRN Money, [name] B, FNP   30 mL at 12/06/17 0944  . carbamazepine (TEGRETOL) tablet 200 mg  200 mg Oral TID Mariel Craft, MD   200 mg at 12/07/17 1300  . cephALEXin (KEFLEX) capsule 500 mg  500 mg Oral Q8H Antonieta Pert, MD   500 mg at 12/07/17 1310  . cloNIDine (CATAPRES) tablet 0.1 mg  0.1 mg Oral BID PRN Mariel Craft, MD      . dicyclomine (BENTYL) tablet 20 mg  20 mg Oral Q6H PRN Mariel Craft, MD   20 mg at 12/06/17 2106  . diphenhydrAMINE (BENADRYL) injection 50 mg  50 mg Intramuscular Once Antonieta Pert, MD      . FLUoxetine (PROZAC) capsule 40 mg  40 mg Oral Daily Mariel Craft, MD   40 mg at 12/07/17 1032  . gabapentin (NEURONTIN) capsule 600 mg  600 mg Oral TID Mariel Craft, MD   600 mg at 12/07/17 1300  . hydrOXYzine (ATARAX/VISTARIL) tablet 50 mg  50 mg Oral TID PRN Money, Gerlene Burdock, FNP   50 mg at 12/07/17 1139  . loperamide (IMODIUM) capsule 4 mg  4 mg Oral Daily PRN Mariel Craft, MD   4 mg at 12/05/17 1555  . magnesium hydroxide (MILK OF MAGNESIA)  suspension 30 mL  30 mL Oral Daily PRN Money, Gerlene Burdock, FNP      . Melatonin TABS 3 mg  3 mg Oral q1800 Antonieta Pert, MD   3 mg at 12/06/17 1714  . methocarbamol (ROBAXIN) tablet 500 mg  500 mg Oral Q8H PRN Mariel Craft, MD      . naproxen (NAPROSYN) tablet 500 mg  500 mg Oral BID PRN Mariel Craft, MD      . nicotine polacrilex (NICORETTE) gum 2 mg  2 mg Oral PRN Donell Sievert E   2 mg at 12/07/17 1035  . ondansetron (ZOFRAN-ODT) disintegrating tablet 4 mg  4 mg Oral Q6H PRN Mariel Craft, MD      . risperiDONE (RISPERDAL) tablet 3 mg  3 mg Oral BH-qamhs Mariel Craft, MD   3 mg at 12/07/17 1032  . risperiDONE microspheres (RISPERDAL CONSTA) injection 25 mg  25 mg Intramuscular Q14 Days Mariel Craft, MD   25 mg at 12/06/17 1815  . traZODone (DESYREL) tablet 50 mg  50 mg Oral QHS,MR X 1 Kerry Hough, PA-C   50 mg at 12/06/17 2106    Lab Results:  No results found for this or any previous visit (from the past 48 hour(s)).  Blood Alcohol level:  Lab Results  Component Value Date   ETH <10 12/01/2017   ETH <10 10/13/2017    Metabolic Disorder Labs: Lab Results  Component Value Date   HGBA1C 5.3 05/27/2017   MPG 105.41 05/27/2017   MPG 105 10/16/2016   Lab Results  Component Value Date   PROLACTIN 8.8 10/16/2016   PROLACTIN 49.9 (H) 06/07/2016   Lab Results  Component Value Date   CHOL 182 05/27/2017   TRIG 254 (H) 05/27/2017   HDL 49 05/27/2017   CHOLHDL 3.7 05/27/2017   VLDL 51 (H) 05/27/2017   LDLCALC 82 05/27/2017   LDLCALC 75 10/16/2016    Physical Findings: AIMS: Facial and Oral Movements Muscles of Facial Expression: None, normal Lips and Perioral Area: None, normal  Jaw: None, normal Tongue: None, normal,Extremity Movements Upper (arms, wrists, hands, fingers): None, normal Lower (legs, knees, ankles, toes): None, normal, Trunk Movements Neck, shoulders, hips: None, normal, Overall Severity Severity of abnormal movements (highest  score from questions above): None, normal Incapacitation due to abnormal movements: None, normal Patient's awareness of abnormal movements (rate only patient's report): No Awareness, Dental Status Current problems with teeth and/or dentures?: No Does patient usually wear dentures?: No  CIWA:  CIWA-Ar Total: 0 COWS:  COWS Total Score: 1  Musculoskeletal: Strength & Muscle Tone: within normal limits Gait & Station: normal Patient leans: N/A  Psychiatric Specialty Exam: Physical Exam  Nursing note and vitals reviewed. Constitutional: She is oriented to person, place, and time. She appears well-developed and well-nourished. No distress.  Musculoskeletal: Normal range of motion.  Slow gait   Neurological: She is alert and oriented to person, place, and time.  Psychiatric:  Depressed mood, and flat affect     Review of Systems  Constitutional: Negative for fever.  HENT:       Right lower molar broken at gum line with swelling of surrounding gum tissue.   Musculoskeletal: Positive for myalgias.  Psychiatric/Behavioral: Positive for depression and substance abuse. The patient is nervous/anxious.   All other systems reviewed and are negative.   Blood pressure 132/69, pulse 82, temperature 98.4 F (36.9 C), resp. rate 20, height 5\' 2"  (1.575 m), weight 80.3 kg (177 lb).Body mass index is 32.37 kg/m.  General Appearance: Casual and Disheveled  Eye Contact:  Poor  Speech:  Clear and Coherent  Volume:  Decreased  Mood:  dysthymic  Affect:  Congruent and Flat  Thought Process:  Coherent, Linear and Descriptions of Associations: Intact  Orientation:  Full (Time, Place, and Person)  Thought Content:  Hallucinations: None  Suicidal Thoughts:  No  Homicidal Thoughts:  No  Memory:  Immediate;   Fair Recent;   Fair Remote;   Fair  Judgement:  Fair  Insight:  Fair  Psychomotor Activity:  Normal  Concentration:  Concentration: Fair  Recall:  FiservFair  Fund of Knowledge:  Fair  Language:   Fair  Akathisia:  No  Handed:  Right  AIMS (if indicated):     Assets:  Communication Skills Desire for Improvement Resilience Social Support  ADL's:  Intact  Cognition:  WNL  Sleep:  Number of Hours: 6.75    Treatment Plan Summary: Daily contact with patient to assess and evaluate symptoms and progress in treatment and Medication management   Continue with current treatment plan on 12/03/2017 as listed below except were noted  Continue Prozac 40 mg QD Continue Gabapentin 600 mg TID Continue Tegretol 200 mg TID  Continue Risperdal 3 mg QHS for AH and mood stabilization and  convert to LAI:    Risperdal Consta 25 mg IM 12/06/2017  Next dose in 2 weeks with outpatient provider.  Cogentin 0.5 mg BID with Risperdal to prevent EPS. Continue with Trazodone 50 mg for insomnia COWS and Clonidine Protocol for withdrawal, change to PRN   Will continue to monitor vitals ,medication compliance and treatment side effects while patient is here.  Reviewed labs: AST/ALT 63/140 hx of hep c infection ,BAL - , UDS -THC and benzodiazepines. C will continue working on discharge disposition.  Patient to participate in therapeutic milieu  Will require dental/oral surgeon referral for dental abscess of broken tooth.   Mariel CraftSHEILA M Malcome Ambrocio, MD 12/07/2017, 4:07 PM

## 2017-12-07 NOTE — Tx Team (Signed)
Interdisciplinary Treatment and Diagnostic Plan Update  12/07/2017 Time of Session: 2:54 PM  Melaya Hoselton MRN: [id number]  Principal Diagnosis: Schizoaffective disorder [facility])  Secondary Diagnoses: Principal Problem:   Schizoaffective disorder ([place])   Current Medications:  Current Facility-Administered Medications  Medication Dose Route Frequency Provider Last Rate Last Dose  . acetaminophen (TYLENOL) tablet 650 mg  650 mg Oral Q6H PRN Money, Lowry Ram, FNP   650 mg at 12/07/17 1139  . alum & mag hydroxide-simeth (MAALOX/MYLANTA) 200-200-20 MG/5ML suspension 30 mL  30 mL Oral Q4H PRN Money, Darnelle Maffucci B, FNP   30 mL at 12/06/17 0944  . carbamazepine (TEGRETOL) tablet 200 mg  200 mg Oral TID Lavella Hammock, MD   200 mg at 12/07/17 1300  . cephALEXin (KEFLEX) capsule 500 mg  500 mg Oral Q8H Sharma Covert, MD   500 mg at 12/07/17 1310  . cloNIDine (CATAPRES) tablet 0.1 mg  0.1 mg Oral BID PRN Lavella Hammock, MD      . dicyclomine (BENTYL) tablet 20 mg  20 mg Oral Q6H PRN Lavella Hammock, MD   20 mg at 12/06/17 2106  . diphenhydrAMINE (BENADRYL) injection 50 mg  50 mg Intramuscular Once Sharma Covert, MD      . FLUoxetine (PROZAC) capsule 40 mg  40 mg Oral Daily Lavella Hammock, MD   40 mg at 12/07/17 1032  . gabapentin (NEURONTIN) capsule 600 mg  600 mg Oral TID Lavella Hammock, MD   600 mg at 12/07/17 1300  . hydrOXYzine (ATARAX/VISTARIL) tablet 50 mg  50 mg Oral TID PRN Money, Lowry Ram, FNP   50 mg at 12/07/17 1139  . loperamide (IMODIUM) capsule 4 mg  4 mg Oral Daily PRN Lavella Hammock, MD   4 mg at 12/05/17 1555  . magnesium hydroxide (MILK OF MAGNESIA) suspension 30 mL  30 mL Oral Daily PRN Money, Lowry Ram, FNP      . Melatonin TABS 3 mg  3 mg Oral q1800 Sharma Covert, MD   3 mg at 12/06/17 1714  . methocarbamol (ROBAXIN) tablet 500 mg  500 mg Oral Q8H PRN Lavella Hammock, MD      . naproxen (NAPROSYN) tablet 500 mg  500 mg Oral BID PRN Lavella Hammock, MD       . nicotine polacrilex (NICORETTE) gum 2 mg  2 mg Oral PRN Patriciaann Clan E   2 mg at 12/07/17 1035  . ondansetron (ZOFRAN-ODT) disintegrating tablet 4 mg  4 mg Oral Q6H PRN Lavella Hammock, MD      . risperiDONE (RISPERDAL) tablet 3 mg  3 mg Oral BH-qamhs Lavella Hammock, MD   3 mg at 12/07/17 1032  . risperiDONE microspheres (RISPERDAL CONSTA) injection 25 mg  25 mg Intramuscular Q14 Days Lavella Hammock, MD   25 mg at 12/06/17 1815  . traZODone (DESYREL) tablet 50 mg  50 mg Oral QHS,MR X 1 Laverle Hobby, PA-C   50 mg at 12/06/17 2106    PTA Medications: Medications Prior to Admission  Medication Sig Dispense Refill Last Dose  . benztropine (COGENTIN) 0.5 MG tablet Take 1 tablet (0.5 mg total) by mouth at bedtime. For prevention of drug induced tremors. 60 tablet 0 Past Week at Unknown time  . Buprenorphine HCl-Naloxone HCl (SUBOXONE) 12-3 MG FILM Place 1 Film under the tongue daily.   Past Month at Unknown time  . carbamazepine (TEGRETOL) 200 MG tablet Take 1 tablet by mouth  3 (three) times daily.   Past Month at Unknown time  . FLUoxetine (PROZAC) 10 MG capsule Take 3 capsules (30 mg total) by mouth daily. 90 capsule 0 10/04/2017 at 1300  . gabapentin (NEURONTIN) 600 MG tablet Take 1 tablet (600 mg total) by mouth 3 (three) times daily. 90 tablet 0 10/04/2017 at 1800  . haloperidol (HALDOL) 5 MG tablet Take 1 tablet (5 mg total) by mouth 3 (three) times daily. 90 tablet 0 10/04/2017 at 1800  . levothyroxine (SYNTHROID, LEVOTHROID) 50 MCG tablet Take 1 tablet (50 mcg total) by mouth daily before breakfast. For low thyroid Hormone 30 tablet 0 10/04/2017 at Unknown time  . mirtazapine (REMERON) 7.5 MG tablet Take 1 tablet (7.5 mg total) by mouth at bedtime. For depression/sleep 30 tablet 0 10/04/2017 at 1900  . prazosin (MINIPRESS) 2 MG capsule Take 1 capsule (2 mg total) by mouth at bedtime. For nightmares 30 capsule 0 10/04/2017 at 1900    Patient Stressors: Financial difficulties Marital  or family conflict Medication change or noncompliance Occupational concerns Substance abuse  Patient Strengths: Curator fund of knowledge Physical Health  Treatment Modalities: Medication Management, Group therapy, Case management,  1 to 1 session with clinician, Psychoeducation, Recreational therapy.   Physician Treatment Plan for Primary Diagnosis: Schizoaffective disorder ([place]) Long Term Goal(s): Improvement in symptoms so as ready for discharge  Short Term Goals: Ability to identify changes in lifestyle to reduce recurrence of condition will improve Ability to verbalize feelings will improve Compliance with prescribed medications will improve Ability to identify triggers associated with substance abuse/mental health issues will improve Ability to identify changes in lifestyle to reduce recurrence of condition will improve Ability to verbalize feelings will improve Compliance with prescribed medications will improve Ability to identify triggers associated with substance abuse/mental health issues will improve  Medication Management: Evaluate patient's response, side effects, and tolerance of medication regimen.  Therapeutic Interventions: 1 to 1 sessions, Unit Group sessions and Medication administration.  Evaluation of Outcomes: Adequate for Discharge  Physician Treatment Plan for Secondary Diagnosis: Principal Problem:   Schizoaffective disorder ([place])   Long Term Goal(s): Improvement in symptoms so as ready for discharge  Short Term Goals: Ability to identify changes in lifestyle to reduce recurrence of condition will improve Ability to verbalize feelings will improve Compliance with prescribed medications will improve Ability to identify triggers associated with substance abuse/mental health issues will improve Ability to identify changes in lifestyle to reduce recurrence of condition will improve Ability to verbalize feelings will  improve Compliance with prescribed medications will improve Ability to identify triggers associated with substance abuse/mental health issues will improve  Medication Management: Evaluate patient's response, side effects, and tolerance of medication regimen.  Therapeutic Interventions: 1 to 1 sessions, Unit Group sessions and Medication administration.  Evaluation of Outcomes: Adequate for Discharge   RN Treatment Plan for Primary Diagnosis: Schizoaffective disorder ([place]) Long Term Goal(s): Knowledge of disease and therapeutic regimen to maintain health will improve  Short Term Goals: Ability to identify and develop effective coping behaviors will improve and Compliance with prescribed medications will improve  Medication Management: RN will administer medications as ordered by provider, will assess and evaluate patient's response and provide education to patient for prescribed medication. RN will report any adverse and/or side effects to prescribing provider.  Therapeutic Interventions: 1 on 1 counseling sessions, Psychoeducation, Medication administration, Evaluate responses to treatment, Monitor vital signs and CBGs as ordered, Perform/monitor CIWA, COWS, AIMS and Fall Risk screenings as ordered, Perform  wound care treatments as ordered.  Evaluation of Outcomes: Adequate for Discharge   LCSW Treatment Plan for Primary Diagnosis: Schizoaffective disorder [facility]) Long Term Goal(s): Safe transition to appropriate next level of care at discharge, Engage patient in therapeutic group addressing interpersonal concerns.  Short Term Goals: Engage patient in aftercare planning with referrals and resources  Therapeutic Interventions: Assess for all discharge needs, 1 to 1 time with Social worker, Explore available resources and support systems, Assess for adequacy in community support network, Educate family and significant other(s) on suicide prevention, Complete Psychosocial Assessment,  Interpersonal group therapy.  Evaluation of Outcomes: Met  Return home, follow up [facility] and [facility]   Progress in Treatment: Attending groups: Yes Participating in groups: Yes Taking medication as prescribed: Yes Toleration medication: Yes, no side effects reported at this time Family/Significant other contact made: No Patient understands diagnosis: Yes AEB asking for medications to treat symptoms. Discussing patient identified problems/goals with staff: Yes Medical problems stabilized or resolved: Yes Denies suicidal/homicidal ideation: Yes Issues/concerns per patient self-inventory: None Other: N/A  New problem(s) identified: None identified at this time.   New Short Term/Long Term Goal(s): "I need to gat back on medication for voices, depression, anxiety. And I'm withdrawing from heroin."   Discharge Plan or Barriers:   Reason for Continuation of Hospitalization:  Medication stabilization   Estimated Length of Stay: D/C tomorrow  Attendees: Patient:  12/07/2017  2:54 PM  Physician: Melba Coon, MD 12/07/2017  2:54 PM  Nursing: Sena Hitch, RN 12/07/2017  2:54 PM  RN Care Manager: Lars Pinks, RN 12/07/2017  2:54 PM  Social Worker: Ripley Fraise 12/07/2017  2:54 PM  Recreational Therapist: Winfield Cunas 12/07/2017  2:54 PM  Other: Norberto Sorenson 12/07/2017  2:54 PM  Other:  12/07/2017  2:54 PM    Scribe for Treatment Team:  Roque Lias LCSW 12/07/2017 2:54 PM

## 2017-12-07 NOTE — Progress Notes (Signed)
Recreation Therapy Notes  Date: 7.25.19 Time: 1000 Location: [address] Dayroom  Group Topic: Self-Esteem  Goal Area(s) Addresses:  Patient will successfully identify positive attributes about themselves.  Patient will successfully identify benefit of improved self-esteem.   Intervention: Construction paper, markers, colored pencils  Activity: Personalized Plates.  Patients were to design license plates that highlighted personal accomplishments, special dates, interests and things that make them unique.  Education:  Self-Esteem, Discharge Planning.   Education Outcome: Acknowledges education/In group clarification offered/Needs additional education  Clinical Observations/Feedback: Pt did not attend group.    Tyler Robidoux, LRT/CTRS         Becky [name] 12/07/2017 11:46 AM 

## 2017-12-07 NOTE — Progress Notes (Signed)
Pt was observed in the dayroom, attending wrap-up group. Pt appears flat in affect and mood. Pt denies SI/HI/AVH at this time. Rates pain 5/10; Headache.Pt remains preocuppied with somatic c/o's and displaying medication seeking behaviors. Pt states she might get to leave tomorrow.PRN tylenol, imodium, vistaril, and trazodone with repeat requested and given. Support and encouragement offered. Encourage to push fluids. Will continue with POC.
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
## 2017-12-07 NOTE — Progress Notes (Signed)
D: Pt Denies SI, HI, AVH and pain at this time. Cooperative with care thus far. Reports improvement in her mood, states "it's getting better" and presents less irritable on interactions. Continues to be somatic but is verbally redirectable. A: Scheduled and PRN medications given as ordered. Safety checks maintained without self harm gestures / outburst. Continued support and encouragement provided throughout this shift.  R: Pt receptive to care. Cooperative with unit routines. Remains safe on and off unit. Refused to attend all morning groups despite multiple prompts.

## 2017-12-08 MED ORDER — RISPERIDONE 3 MG PO TABS: 3.0000 mg | ORAL_TABLET | Freq: Every day | ORAL | 0 refills | Status: AC

## 2017-12-08 MED ORDER — GABAPENTIN 300 MG PO CAPS: 600.0000 mg | ORAL_CAPSULE | Freq: Three times a day (TID) | ORAL | 0 refills | Status: AC

## 2017-12-08 MED ORDER — CEPHALEXIN 500 MG PO CAPS: 500.0000 mg | ORAL_CAPSULE | Freq: Three times a day (TID) | ORAL | Status: AC

## 2017-12-08 MED ORDER — MELATONIN 3 MG PO TABS: 3.0000 mg | ORAL_TABLET | Freq: Every day | ORAL | 0 refills | Status: AC

## 2017-12-08 MED ORDER — FLUOXETINE HCL 40 MG PO CAPS: 40.0000 mg | ORAL_CAPSULE | Freq: Every day | ORAL | 0 refills | Status: AC

## 2017-12-08 MED ORDER — HYDROXYZINE HCL 50 MG PO TABS: 50.0000 mg | ORAL_TABLET | Freq: Three times a day (TID) | ORAL | 0 refills | Status: AC | PRN

## 2017-12-08 MED ORDER — NICOTINE POLACRILEX 2 MG MT GUM: 2.0000 mg | CHEWING_GUM | OROMUCOSAL | 0 refills | Status: AC | PRN

## 2017-12-08 MED ORDER — TRAZODONE HCL 50 MG PO TABS: 50.0000 mg | ORAL_TABLET | Freq: Every evening | ORAL | 0 refills | Status: AC | PRN

## 2017-12-08 MED ORDER — TRAZODONE HCL 50 MG PO TABS: 50.0000 mg | ORAL_TABLET | Freq: Every day | ORAL | Status: DC

## 2017-12-08 MED ORDER — RISPERIDONE MICROSPHERES 25 MG IM SUSR: 25.0000 mg | INTRAMUSCULAR | 0 refills | Status: AC

## 2017-12-08 MED ORDER — CARBAMAZEPINE 200 MG PO TABS: 200.0000 mg | ORAL_TABLET | Freq: Three times a day (TID) | ORAL | 0 refills | Status: AC

## 2017-12-08 NOTE — Progress Notes (Signed)
Recreation Therapy Notes  Date: 7.26.19 Time: 1000 Location: 500 Hall Dayroom  Group Topic: Stress Management  Goal Area(s) Addresses:  Patient will verbalize importance of using healthy stress management.  Patient will identify positive emotions associated with healthy stress management.   Intervention: Stress Management  Activity :  Deep Breathing, Guided Imagery.  LRT introduced the stress management techniques of deep breathing and guided imagery.  LRT lead patients through each technique so they could participate.  Patients were to follow along as LRT lead them through each stress management technique.  Education:  Stress Management, Discharge Planning.   Education Outcome: Acknowledges edcuation/In group clarification offered/Needs additional education  Clinical Observations/Feedback: Pt did not attend group.    Mikaila Grunert, LRT/CTRS         Kyona Chauncey A 12/08/2017 11:43 AM 

## 2017-12-08 NOTE — Plan of Care (Signed)
Pt did not attend recreation therapy group sessions to identify positive coping strategies.   Caroll RancherMarjette Tymeka Privette, LRT/CTRS

## 2017-12-08 NOTE — Progress Notes (Signed)
D: Pt A & O X 4. Denies SI, HI, AVH and pain at this time.Presents flat on approach but brightens up on interaction. Rates her depression 4/10, hopelessness 3/10 and anxiety 7/10. Reports she's sleeping well with good appetite, normal energy and good concentration level.  D/C home as ordered. Picked up in lobby by by Kerr-McGeesheriff. A: D/C instructions reviewed with pt including prescriptions, medication samples and follow up appointment; compliance encouraged. Pt had no belongs in locker at time of admission and discharge. Scheduled and PRN medications given with verbal education and effects monitored. Safety checks maintained without incident till time of d/c.  R: Pt receptive to care. Compliant with medications when offered. Denies adverse drug reactions when assessed. Verbalized understanding related to d/c instructions. Signed belonging sheet in agreement that she had no belongings in locker. Ambulatory with a steady gait. Appears to be in no physical distress at time of departure.
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
## 2017-12-08 NOTE — Discharge Summary (Addendum)
Physician Discharge Summary Note  Patient:  Becky [name] is an 27 y.o., female MRN:  [id number]  DOB:  09/30/1990  Patient phone:  [phone] (home)   Patient address:   [place] [address],   Total Time spent with patient: Greater than 30 minutes  Date of Admission:  12/01/2017 Date of Discharge: 12/08/2017   Reason for Admission: Intentional drug overdose.   Principal Problem: Schizoaffective disorder [facility])  Discharge Diagnoses: Patient Active Problem List   Diagnosis Date Noted  . Schizoaffective disorder ([place]) [F25.9] 12/01/2017  . Alcohol abuse [F10.10] 05/26/2017  . Schizoaffective disorder, bipolar type ([place]) [F25.0] 05/26/2017  . Polysubstance abuse ([place]) [F19.10]   . Altered mental state [R41.82] 05/08/2017  . Hallucinations [R44.3]   . Bipolar disorder, curr episode mixed, severe, with psychotic features ([place]) [F31.64] 11/02/2016  . PTSD (post-traumatic stress disorder) [F43.10] 11/02/2016  . GAD (generalized anxiety disorder) [F41.1] 10/16/2016  . Opioid use disorder, moderate, dependence ([place]) [F11.20] 06/06/2016  . Sedative, hypnotic or anxiolytic use disorder, severe, dependence ([place]) [F13.20] 06/06/2016  . Cocaine use disorder, severe, dependence ([place]) [F14.20] 06/06/2016  . Cannabis use disorder, severe, dependence ([place]) [F12.20] 06/06/2016  . MDD (major depressive disorder), recurrent severe, without psychosis ([place]) [F33.2] 06/03/2016  . Bipolar I disorder, most recent episode depressed ([place]) [F31.30] 07/04/2015  . Overdose [T50.901A] 07/02/2015  . Lactic acidosis [E87.2] 07/02/2015  . Acute respiratory failure ([place]) [J96.00] 07/02/2015  . Sepsis ([place]) [A41.9] 07/02/2015  . AKI (acute kidney injury) ([place]) [N17.9]   . Altered mental status [R41.82]   . Pyrexia [R50.9]   . Abdominal pain, epigastric [R10.13] 06/17/2015  . Hepatitis C antibody test positive [R76.8] 06/17/2015  . Peptic ulcer disease [K27.9] 06/17/2015  . Epigastric  pain [R10.13]   . Transaminitis [R74.0] 06/16/2015  . History of substance abuse [Z87.898] 06/16/2015   Past Psychiatric History: GAD, Cocaine use disorder, Cannabis use disorder, BP  Past Medical History:  Past Medical History:  Diagnosis Date  . Bipolar 1 disorder ([place])   . Bronchitis   . GAD (generalized anxiety disorder)   . Hepatitis C antibody test positive 06/17/2015  . History of substance abuse 06/16/2015  . Hypothyroidism   . OCD (obsessive compulsive disorder)   . Peptic ulcer   . Polysubstance abuse ([place])   . PTSD (post-traumatic stress disorder)     Past Surgical History:  Procedure Laterality Date  . CESAREAN SECTION    . OTHER SURGICAL HISTORY     scar tissue removed from right ovary   . OTHER SURGICAL HISTORY     fallopian tube repair  . WISDOM TOOTH EXTRACTION     Family History:  Family History  Problem Relation Age of Onset  . Drug abuse Brother   . Suicidality Brother   . Cancer Other   . Depression Mother   . Drug abuse Mother   . Alcoholism Father   . Bipolar disorder Cousin    Family Psychiatric  History: See H&P  Social History:  Social History   Substance and Sexual Activity  Alcohol Use Yes   Comment: occasional use     Social History   Substance and Sexual Activity  Drug Use Yes  . Types: Marijuana, Methamphetamines, MDMA (Ecstacy), Oxycodone, Benzodiazepines, Cocaine, IV   Comment: uses heroin, crack and marijuana,  last used 10/04/2017    Social History   Socioeconomic History  . Marital status: Legally Separated    Spouse name: Not on file  . Number of  children: Not on file  . Years of education: Not on file  . Highest education level: Not on file  Occupational History  . Not on file  Social Needs  . Financial resource strain: Not on file  . Food insecurity:    Worry: Not on file    Inability: Not on file  . Transportation needs:    Medical: Not on file    Non-medical: Not on file  Tobacco Use  . Smoking status:  Current Every Day Smoker    Packs/day: 1.00    Years: 10.00    Pack years: 10.00    Types: Cigarettes  . Smokeless tobacco: Never Used  Substance and Sexual Activity  . Alcohol use: Yes    Comment: occasional use  . Drug use: Yes    Types: Marijuana, Methamphetamines, MDMA (Ecstacy), Oxycodone, Benzodiazepines, Cocaine, IV    Comment: uses heroin, crack and marijuana,  last used 10/04/2017  . Sexual activity: Yes    Birth control/protection: None  Lifestyle  . Physical activity:    Days per week: Not on file    Minutes per session: Not on file  . Stress: Not on file  Relationships  . Social connections:    Talks on phone: Not on file    Gets together: Not on file    Attends religious service: Not on file    Active member of club or organization: Not on file    Attends meetings of clubs or organizations: Not on file    Relationship status: Not on file  Other Topics Concern  . Not on file  Social History Narrative  . Not on file   Hospital Course: (Per Md's admission SRA): Patient is a 27 year old female with a reported history of bipolar disorder, schizoaffective disorder, generalized anxiety disorder, history of substance abuse, hypothyroidism who presented to the [facility] emergency department on 7/19 after reported intentional overdose of Tegretol.  The patient is a poor historian, and really cannot provide a great deal of history at this point.  According to the notes from the emergency room the patient had been brought to the emergency department by police for an intentional overdose on Tegretol.  She also had used heroin 2 days prior to admission.  She stated that she took 12 Tegretol in a suicide attempt the day prior to admission.  She reportedly stated that she thought she had lost consciousness because her face and chest were sore and a new bruise was on her chin.  The patient endorsed suicidal attempt in the emergency department and stated "I feel like I am starting  withdrawal from heroin".  In the emergency department her drug screen was positive for benzodiazepines and THC, but negative for opiates.  Her Tegretol level was 12.3.  Her desire on admission was to be restarted on her medications.  She stated this a.m. that she was taking Haldol and Zyprexa.  She stated that she was unsure of the dosage.  She stated she had stopped her medicines after her most recent hospitalization at [facility] 3 weeks ago.  She stated she did not get her medications after discharge.  She had also been admitted to [facility] in May, and had been treated with Cogentin, clonidine, Haldol, hydroxyzine, loperamide, mirtazapine, ondansetron, Seroquel, Zoloft on admission, but these were stopped.  She was discharged on fluoxetine, Robaxin, prazosin, and was continued on Tegretol, dicyclomine, Neurontin, levothyroxine, and propranolol.  I am not sure where the Haldol and Zyprexa came from except  if it was from here.  Again the patient is not very cooperative this morning and very irritable.  We will have to contact her pharmacy for a more clear idea of what medications that she was discharged on at [facility].  She was admitted to the hospital for evaluation and stabilization.  This is one of Becky [name]'s several discharge summaries from this [facility] alone. She has also been to other psychiatric hospitals in the area for mood stabilization & substance abuse treatments. Upon each hospital discharge, she was recommended to continue further mental health care & substance abuse treatment on an outpatient basis. Becky [name] was re-admitted to the [facility] this time around with her UDS positive for Benzodiazepine & THC. She is known to have hx of polysubstance use disorder including opoid drugs. She was in need of mood stabilization & opioid detoxification treatments. She received Clonidine detox protocols for opioid detoxification treatments for the opioid withdrawal symptoms.  Besides the Clonidine detox  protocols, Becky [name] was also medicated & discharged on; Hydroxyzine 25 mg prn for anxiety, Fluoxetine 40 mg for depression, Nicorette gum for smoking cessation, Risperdal 3 mg po x 15 days for mood control, Risperdal injectable 25 mg/ml IM Q 14 days mg for mood control, Melatonin 3 mg for insomnia, Tegretol 200 mg for mood stabilization, Trazodone 50 mg prn for insomnia & Gabapentin 300 mg for agitation. She was enrolled in the group counseling sessions being offered & held on this unit. She learned coping skills. She received other pertinent medications for the other medical issues presented including the remaining doses of her antibiotic therapy to complete at home. She tolerated her treatment regimen without any adverse effects or reactions reported.  Becky [name] has completed her detoxification treatment & her mood is stable. She is seen today by her attending psychiatrist for discharge.  She is looking forward to working on her addiction/mental health issues. Not expressing any delusions today. No hallucinations. Feels in control of herself. No fantasy about suicide lately. No suicidal thoughts. No thoughts of violence. No craving for drugs. Does not feel depressed. No evidence of mania.  The nursing staff reports that patient has been appropriate on the unit. Patient has been interacting well with peers. No behavioral issues. Patient has not voiced any suicidal thoughts. Patient has not been observed to be internally stimulated or preoccupied. Patient has been adherent with treatment recommendations. Patient has been tolerating her medication well. No reported adverse effects or reactions.   Patient was discussed at the treatment team meeting this morning. The team members feel that patient is back to her baseline level of function.The team agreed with plan to discharge patient today to continue mental health care on an outpatient basis as noted below. Becky [name] was provided with a 7 days worth, supply  samples of her [facility] discharge medications. She left [facility] with all personal belongings in no apparent distress.    Physical Findings: AIMS: Facial and Oral Movements Muscles of Facial Expression: None, normal Lips and Perioral Area: None, normal Jaw: None, normal Tongue: None, normal,Extremity Movements Upper (arms, wrists, hands, fingers): None, normal Lower (legs, knees, ankles, toes): None, normal, Trunk Movements Neck, shoulders, hips: None, normal, Overall Severity Severity of abnormal movements (highest score from questions above): None, normal Incapacitation due to abnormal movements: None, normal Patient's awareness of abnormal movements (rate only patient's report): No Awareness, Dental Status Current problems with teeth and/or dentures?: No Does patient usually wear dentures?: No  CIWA:  CIWA-Ar Total: 0 COWS:  COWS Total Score:  3  Musculoskeletal: Strength & Muscle Tone: within normal limits Gait & Station: normal Patient leans: N/A  Psychiatric Specialty Exam: Physical Exam  Nursing note and vitals reviewed. Constitutional: She is oriented to person, place, and time. She appears well-developed.  HENT:  Head: Normocephalic.  Eyes: Pupils are equal, round, and reactive to light.  Neck: Normal range of motion.  Cardiovascular: Normal rate.  Respiratory: Effort normal.  GI: Soft.  Genitourinary:  Genitourinary Comments: Deferred  Musculoskeletal: Normal range of motion.  Neurological: She is alert and oriented to person, place, and time.  Skin: Skin is warm and dry.  Psychiatric: She has a normal mood and affect. Her speech is normal and behavior is normal. Thought content normal.    Review of Systems  Constitutional: Negative.   HENT: Negative.   Eyes: Negative.   Respiratory: Negative.  Negative for cough and shortness of breath.   Cardiovascular: Negative.  Negative for chest pain and palpitations.  Gastrointestinal: Negative.   Skin: Negative.    Neurological: Negative.   Psychiatric/Behavioral: Positive for depression (Stable), hallucinations (Hx. Psychosis (stable)) and substance abuse (Hx. Polysubstance use disorder). Negative for memory loss and suicidal ideas. The patient has insomnia (Stable). The patient is not nervous/anxious.     Blood pressure 120/79, pulse 80, temperature 98 F (36.7 C), temperature source Oral, resp. rate 18, height _0  (1.575 m), weight 80.3 kg (177 lb).Body mass index is 32.37 kg/m.  See Md's SRA.  Have you used any form of tobacco in the last 30 days? (Cigarettes, Smokeless Tobacco, Cigars, and/or Pipes): Yes  Has this patient used any form of tobacco in the last 30 days? (Cigarettes, Smokeless Tobacco, Cigars, and/or Pipes): Yes, provided with a nicotine gum prescription upon discharge for smoking cessation.  Blood Alcohol level:  Lab Results  Component Value Date   ETH <10 12/01/2017   ETH <10 63/78/5885   Metabolic Disorder Labs:  Lab Results  Component Value Date   HGBA1C 5.3 05/27/2017   MPG 105.41 05/27/2017   MPG 105 10/16/2016   Lab Results  Component Value Date   PROLACTIN 8.8 10/16/2016   PROLACTIN 49.9 (H) 06/07/2016   Lab Results  Component Value Date   CHOL 182 05/27/2017   TRIG 254 (H) 05/27/2017   HDL 49 05/27/2017   CHOLHDL 3.7 05/27/2017   VLDL 51 (H) 05/27/2017   LDLCALC 82 05/27/2017   LDLCALC 75 10/16/2016   See Psychiatric Specialty Exam and Suicide Risk Assessment completed by Attending Physician prior to discharge.  Discharge destination:  Home  Is patient on multiple antipsychotic therapies at discharge:  No   Has Patient had three or more failed trials of antipsychotic monotherapy by history:  No  Recommended Plan for Multiple Antipsychotic Therapies: NA  Allergies as of 12/08/2017      Reactions   Azithromycin Anaphylaxis   Nsaids Other (See Comments)   Reaction:  Stomach ulcers    Tolmetin Other (See Comments)   Reaction:  Stomach ulcers        Medication List    STOP taking these medications   benztropine 0.5 MG tablet Commonly known as:  COGENTIN   gabapentin 600 MG tablet Commonly known as:  NEURONTIN Replaced by:  gabapentin 300 MG capsule   haloperidol 5 MG tablet Commonly known as:  HALDOL   levothyroxine 50 MCG tablet Commonly known as:  SYNTHROID, LEVOTHROID   mirtazapine 7.5 MG tablet Commonly known as:  REMERON   prazosin 2 MG capsule Commonly known as:  MINIPRESS   SUBOXONE 12-3 MG Film Generic drug:  Buprenorphine HCl-Naloxone HCl     TAKE these medications     Indication  carbamazepine 200 MG tablet Commonly known as:  TEGRETOL Take 1 tablet (200 mg total) by mouth 3 (three) times daily. For mood stabilization What changed:  additional instructions  Indication:  Mood stabilization   cephALEXin 500 MG capsule Commonly known as:  KEFLEX Take 1 capsule (500 mg total) by mouth every 8 (eight) hours. For infection  Indication:  Infection   FLUoxetine 40 MG capsule Commonly known as:  PROZAC Take 1 capsule (40 mg total) by mouth daily. For depression Start taking on:  12/09/2017 What changed:    medication strength  how much to take  additional instructions  Indication:  Major Depressive Disorder   gabapentin 300 MG capsule Commonly known as:  NEURONTIN Take 2 capsules (600 mg total) by mouth 3 (three) times daily. Agitation Replaces:  gabapentin 600 MG tablet  Indication:  Agitation   hydrOXYzine 50 MG tablet Commonly known as:  ATARAX/VISTARIL Take 1 tablet (50 mg total) by mouth 3 (three) times daily as needed (for anxiety).  Indication:  Feeling Anxious   Melatonin 3 MG Tabs Take 1 tablet (3 mg total) by mouth daily at 6 PM. For sleep  Indication:  Trouble Sleeping   nicotine polacrilex 2 MG gum Commonly known as:  NICORETTE Take 1 each (2 mg total) by mouth as needed for smoking cessation. (May buy from over the counter): For smoking cessation  Indication:  Nicotine  Addiction   risperiDONE 3 MG tablet Commonly known as:  RISPERDAL Take 1 tablet (3 mg total) by mouth at bedtime. For mood control  Indication:  Mood control   risperiDONE microspheres 25 MG injection Commonly known as:  RISPERDAL CONSTA Inject 2 mLs (25 mg total) into the muscle every 14 (fourteen) days. (Due on 12-20-17): For mood control Start taking on:  12/20/2017  Indication:  Mood control   traZODone 50 MG tablet Commonly known as:  DESYREL Take 1 tablet (50 mg total) by mouth at bedtime as needed for sleep.  Indication:  Trouble Sleeping      Follow-up Information    Services, [facility] Follow up on 12/13/2017.   Why:  Wednesday at 8:30 AM for your hospital follow up appointment Contact information: [place] [address] [phone]        [facility] Follow up.   Why:  When you go to [facility], it will generate a referral to [facility] and [facility] [place].  They will then call you, or you can contact them at [phone] Contact information: In the same building as [facility] [address] [place]: [phone]         Follow-up recommendations: Activity:  As tolerated Diet: As recommended by your primary care doctor. Keep all scheduled follow-up appointments as recommended.   Comments: Patient is instructed prior to discharge to: Take all medications as prescribed by his/her mental healthcare provider. Report any adverse effects and or reactions from the medicines to his/her outpatient provider promptly. Patient has been instructed & cautioned: To not engage in alcohol and or illegal drug use while on prescription medicines. In the event of worsening symptoms, patient is instructed to call the crisis hotline, 911 and or go to the nearest ED for appropriate evaluation and treatment of symptoms. To follow-up with his/her primary care provider for your other medical issues, concerns  and or health care needs.    Signed: Lindell Spar,  NP PMHNP-BC, FNP-BC 12/08/2017, 9:15 AM  I have reviewed NP's Note, assessement, diagnosis and plan, and agree. I have also met with patient and completed suicide risk assessment.  Lavella Hammock, MD

## 2017-12-08 NOTE — Progress Notes (Signed)
  [facility] :  Will you be returning to the same living situation after discharge:  Yes,  home At discharge, do you have transportation home?: Yes,  sheriff Do you have the ability to pay for your medications: Yes,  mental health  Release of information consent forms completed and in the chart;  Patient's signature needed at discharge.  Patient to Follow up at: Follow-up Information    Services, [facility] Follow up on 12/13/2017.   Why:  Wednesday at 8:30 AM for your hospital follow up appointment Contact information: [address] [place] [id number] [phone]        [facility] Follow up.   Why:  [facility] and [facility].  They will then call you, or you can contact them at [phone] Contact information: In the same building as [facility] [address] [place] [phone]  F: [phone]          Next level of care provider has [facility] to [facility] Link:no  Safety Planning and Suicide Prevention discussed: Yes,  yes  Have you used any form of tobacco in the last 30 days? (Cigarettes, Smokeless Tobacco, Cigars, and/or Pipes): Yes  Has patient been referred to the Quitline?: Patient refused referral  Patient has been referred for addiction treatment: Yes  Ida RogueRodney B Paislynn Hegstrom, LCSW 12/08/2017, 11:13 AM

## 2017-12-08 NOTE — Progress Notes (Signed)
Recreation Therapy Notes  INPATIENT RECREATION TR PLAN  Patient Details Name: Becky [name] MRN: [id number] DOB: March 31, 1991 Today's Date: 12/08/2017  Rec Therapy Plan Is patient appropriate for Therapeutic Recreation?: Yes Treatment times per week: about 3 days Estimated Length of Stay: 5-7 days TR Treatment/Interventions: Group participation (Comment)  Discharge Criteria Pt will be discharged from therapy if:: Discharged Treatment plan/goals/alternatives discussed and agreed upon by:: Patient/family  Discharge Summary Short term goals set: See patient care plan Short term goals met: Not met Progress toward goals comments: Groups attended Which groups?: AAA/T Reason goals not met: Pt attended one group Therapeutic equipment acquired: N/A Reason patient discharged from therapy: Discharge from hospital Pt/family agrees with progress & goals achieved: Yes Date patient discharged from therapy: 12/08/17    Victorino Sparrow, LRT/CTRS  Ria Comment, Baylei Siebels A 12/08/2017, 11:48 AM

## 2017-12-14 ENCOUNTER — Emergency Department (HOSPITAL_COMMUNITY)
Admission: EM | Admit: 2017-12-14 | Discharge: 2017-12-15 | Disposition: A | Discharge status: Home / Self Care | Payer: Self-pay | Attending: Emergency Medicine | Admitting: Emergency Medicine

## 2017-12-14 ENCOUNTER — Encounter (HOSPITAL_COMMUNITY): Payer: Self-pay | Admitting: Emergency Medicine

## 2017-12-14 ENCOUNTER — Other Ambulatory Visit: Payer: Self-pay

## 2017-12-14 DIAGNOSIS — Z79899 Other long term (current) drug therapy: Secondary | ICD-10-CM | POA: Insufficient documentation

## 2017-12-14 DIAGNOSIS — F1910 Other psychoactive substance abuse, uncomplicated: Secondary | ICD-10-CM

## 2017-12-14 DIAGNOSIS — F1920 Other psychoactive substance dependence, uncomplicated: Secondary | ICD-10-CM | POA: Insufficient documentation

## 2017-12-14 DIAGNOSIS — R112 Nausea with vomiting, unspecified: Secondary | ICD-10-CM | POA: Insufficient documentation

## 2017-12-14 DIAGNOSIS — F17210 Nicotine dependence, cigarettes, uncomplicated: Secondary | ICD-10-CM | POA: Insufficient documentation

## 2017-12-14 DIAGNOSIS — R45851 Suicidal ideations: Secondary | ICD-10-CM | POA: Insufficient documentation

## 2017-12-14 LAB — COMPREHENSIVE METABOLIC PANEL
ALT: 129 U/L — AB (ref 0–44)
AST: 62 U/L — AB (ref 15–41)
Albumin: 4.4 g/dL (ref 3.5–5.0)
Alkaline Phosphatase: 107 U/L (ref 38–126)
Anion gap: 11 (ref 5–15)
BUN: 14 mg/dL (ref 6–20)
CHLORIDE: 102 mmol/L (ref 98–111)
CO2: 24 mmol/L (ref 22–32)
CREATININE: 0.78 mg/dL (ref 0.44–1.00)
Calcium: 9.5 mg/dL (ref 8.9–10.3)
Glucose, Bld: 120 mg/dL — ABNORMAL HIGH (ref 70–99)
POTASSIUM: 3.1 mmol/L — AB (ref 3.5–5.1)
Sodium: 137 mmol/L (ref 135–145)
Total Bilirubin: 0.7 mg/dL (ref 0.3–1.2)
Total Protein: 8 g/dL (ref 6.5–8.1)

## 2017-12-14 LAB — CBC WITH DIFFERENTIAL/PLATELET
BASOS PCT: 0 %
Basophils Absolute: 0 10*3/uL (ref 0.0–0.1)
EOS ABS: 0.1 10*3/uL (ref 0.0–0.7)
EOS PCT: 1 %
HCT: 40.3 % (ref 36.0–46.0)
Hemoglobin: 14.1 g/dL (ref 12.0–15.0)
LYMPHS ABS: 2.3 10*3/uL (ref 0.7–4.0)
Lymphocytes Relative: 20 %
MCH: 30.9 pg (ref 26.0–34.0)
MCHC: 35 g/dL (ref 30.0–36.0)
MCV: 88.2 fL (ref 78.0–100.0)
MONO ABS: 0.8 10*3/uL (ref 0.1–1.0)
MONOS PCT: 7 %
NEUTROS PCT: 72 %
Neutro Abs: 8.4 10*3/uL — ABNORMAL HIGH (ref 1.7–7.7)
PLATELETS: 398 10*3/uL (ref 150–400)
RBC: 4.57 MIL/uL (ref 3.87–5.11)
RDW: 14 % (ref 11.5–15.5)
WBC: 11.6 10*3/uL — ABNORMAL HIGH (ref 4.0–10.5)

## 2017-12-14 LAB — RAPID URINE DRUG SCREEN, HOSP PERFORMED
AMPHETAMINES: POSITIVE — AB
BENZODIAZEPINES: POSITIVE — AB
Barbiturates: NOT DETECTED
COCAINE: POSITIVE — AB
OPIATES: POSITIVE — AB
Tetrahydrocannabinol: POSITIVE — AB

## 2017-12-14 LAB — I-STAT BETA HCG BLOOD, ED (MC, WL, AP ONLY)

## 2017-12-14 LAB — ETHANOL

## 2017-12-14 MED ORDER — HYDROXYZINE HCL 25 MG PO TABS
50.0000 mg | ORAL_TABLET | Freq: Three times a day (TID) | ORAL | Status: DC | PRN
  Administered 2017-12-15: 50 mg via ORAL
  Filled 2017-12-14: qty 2

## 2017-12-14 MED ORDER — SODIUM CHLORIDE 0.9 % IV SOLN: 1000.0000 mL | INTRAVENOUS | Status: DC

## 2017-12-14 MED ORDER — CEPHALEXIN 500 MG PO CAPS
500.0000 mg | ORAL_CAPSULE | Freq: Three times a day (TID) | ORAL | Status: DC
  Administered 2017-12-14 – 2017-12-15 (×2): 500 mg via ORAL
  Filled 2017-12-14 (×2): qty 1

## 2017-12-14 MED ORDER — SODIUM CHLORIDE 0.9 % IV BOLUS (SEPSIS)
1000.0000 mL | Freq: Once | INTRAVENOUS | Status: AC
  Administered 2017-12-14: 1000 mL via INTRAVENOUS

## 2017-12-14 MED ORDER — HALOPERIDOL LACTATE 5 MG/ML IJ SOLN
5.0000 mg | Freq: Once | INTRAMUSCULAR | Status: AC
  Administered 2017-12-14: 5 mg via INTRAMUSCULAR
  Filled 2017-12-14: qty 1

## 2017-12-14 MED ORDER — TRAZODONE HCL 50 MG PO TABS
50.0000 mg | ORAL_TABLET | Freq: Every evening | ORAL | Status: DC | PRN
  Administered 2017-12-14: 50 mg via ORAL
  Filled 2017-12-14: qty 1

## 2017-12-14 MED ORDER — CARBAMAZEPINE 200 MG PO TABS
200.0000 mg | ORAL_TABLET | Freq: Three times a day (TID) | ORAL | Status: DC
Start: 2017-12-14 — End: 2017-12-15
  Administered 2017-12-14 – 2017-12-15 (×2): 200 mg via ORAL
  Filled 2017-12-14 (×2): qty 1

## 2017-12-14 MED ORDER — FLUOXETINE HCL 20 MG PO CAPS
40.0000 mg | ORAL_CAPSULE | Freq: Every day | ORAL | Status: DC
  Administered 2017-12-15: 40 mg via ORAL
  Filled 2017-12-14: qty 2

## 2017-12-14 MED ORDER — ZIPRASIDONE MESYLATE 20 MG IM SOLR
INTRAMUSCULAR | Status: AC
  Filled 2017-12-14: qty 20

## 2017-12-14 MED ORDER — ALUM & MAG HYDROXIDE-SIMETH 200-200-20 MG/5ML PO SUSP: 30.0000 mL | Freq: Four times a day (QID) | ORAL | Status: DC | PRN

## 2017-12-14 MED ORDER — ZOLPIDEM TARTRATE 5 MG PO TABS
5.0000 mg | ORAL_TABLET | Freq: Every evening | ORAL | Status: DC | PRN
  Administered 2017-12-14: 5 mg via ORAL
  Filled 2017-12-14: qty 1

## 2017-12-14 MED ORDER — HALOPERIDOL LACTATE 5 MG/ML IJ SOLN
INTRAMUSCULAR | Status: AC
  Filled 2017-12-14: qty 1

## 2017-12-14 MED ORDER — ONDANSETRON HCL 4 MG PO TABS
4.0000 mg | ORAL_TABLET | Freq: Three times a day (TID) | ORAL | Status: DC | PRN
  Administered 2017-12-14: 4 mg via ORAL
  Filled 2017-12-14: qty 1

## 2017-12-14 MED ORDER — RISPERIDONE 1 MG PO TABS
3.0000 mg | ORAL_TABLET | Freq: Every day | ORAL | Status: DC
  Administered 2017-12-14: 3 mg via ORAL
  Filled 2017-12-14: qty 3

## 2017-12-14 MED ORDER — GABAPENTIN 300 MG PO CAPS
600.0000 mg | ORAL_CAPSULE | Freq: Three times a day (TID) | ORAL | Status: DC
  Administered 2017-12-14 – 2017-12-15 (×2): 600 mg via ORAL
  Filled 2017-12-14 (×2): qty 2

## 2017-12-14 NOTE — ED Notes (Signed)
Patient belongings secured in locker. 

## 2017-12-14 NOTE — ED Notes (Signed)
Patient ripped IV out of her on and went to bathroom, placed bandage over site.

## 2017-12-14 NOTE — ED Notes (Addendum)
Pt screaming and cussing at staff. Unable to reason or get under control. Haldol Im adm . Pt says woopty fucking do you gave me haldol. Pt very paranoid and hears staff talking on phone and thinks we are talking about her

## 2017-12-14 NOTE — ED Notes (Addendum)
Pt continually standing up and walking to another room and talking to people as they walk by. Convinced that other pt has a cell phone. Yelling and cussing at staff

## 2017-12-14 NOTE — ED Provider Notes (Signed)
3:47 PM Care assumed from Dr. Rosalia Hammersay.  Patient presented with nausea, vomiting, tachycardia after injecting heroin this morning.  Heart rate has come down with IV fluids.  Patient is requesting being admitted for detox.  When advised that she would be given resources to contact them herself, she states that she is suicidal.  She states specifically that she needs to go to rehab and knows that we will not send her to rehab unless she states she is suicidal.  She states that she will overdose, because that is easiest way to do it.  We will get TTS consultation.  5:04 PM Patient agitated and verbally abusive to staff.  Given haloperidol injection for patient safety and staff safety.  CRITICAL CARE Performed by: Dione Boozeavid Camilia Caywood Total critical care time: 45 minutes Critical care time was exclusive of separately billable procedures and treating other patients. Critical care was necessary to treat or prevent imminent or life-threatening deterioration. Critical care was time spent personally by me on the following activities: development of treatment plan with patient and/or surrogate as well as nursing, discussions with consultants, evaluation of patient's response to treatment, examination of patient, obtaining history from patient or surrogate, ordering and performing treatments and interventions, ordering and review of laboratory studies, ordering and review of radiographic studies, pulse oximetry and re-evaluation of patient's condition.  5:44 PM Patient much more agitated and screaming and stating that she wanted to leave.  Since she had expressed suicidal intent, involuntary commitment papers are filled out.  I have talked with the patient to try to get her calmed to avoid need for additional sedation.  TTS consultation is pending.  11:22 PM Patient currently sleeping.  She did calm down following injection of haloperidol, but TTS has not been able to evaluate her because of sedation.   Dione BoozeGlick, Pershing Skidmore,  MD 12/14/17 [phone]

## 2017-12-14 NOTE — ED Triage Notes (Signed)
Per EMS patient used heroin this am, has been having hallucinations. Patient told EMS she would hurt herself so she could be committed.

## 2017-12-14 NOTE — ED Notes (Signed)
Patient keeps walk out of room into hallway, refusing to be redirect, security called to observe patient.

## 2017-12-14 NOTE — ED Provider Notes (Signed)
[facility] EMERGENCY DEPARTMENT Provider Note   CSN: [id number] Arrival date & time: 12/14/17  1409     History   Chief Complaint No chief complaint on file.   HPI Becky [name] is a 27 y.o. female.  HPI 27 year old female bipolar disorder, polysubstance abuse, presents today stating that she shot up some here this morning has had nausea and vomiting since that time.  She denies fever, chills, pain, skin rash or redness. Past Medical History:  Diagnosis Date  . Bipolar 1 disorder (HCC)   . Bronchitis   . GAD (generalized anxiety disorder)   . Hepatitis C antibody test positive 06/17/2015  . History of substance abuse 06/16/2015  . Hypothyroidism   . OCD (obsessive compulsive disorder)   . Peptic ulcer   . Polysubstance abuse (HCC)   . PTSD (post-traumatic stress disorder)     Patient Active Problem List   Diagnosis Date Noted  . Schizoaffective disorder (HCC) 12/01/2017  . Alcohol abuse 05/26/2017  . Schizoaffective disorder, bipolar type (HCC) 05/26/2017  . Polysubstance abuse (HCC)   . Altered mental state 05/08/2017  . Hallucinations   . Bipolar disorder, curr episode mixed, severe, with psychotic features (HCC) 11/02/2016  . PTSD (post-traumatic stress disorder) 11/02/2016  . GAD (generalized anxiety disorder) 10/16/2016  . Opioid use disorder, moderate, dependence (HCC) 06/06/2016  . 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 Cocaine use disorder, severe, dependence (HCC) 06/06/2016  . Cannabis use disorder, severe, dependence (HCC) 06/06/2016  . 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 Bipolar I disorder, most recent episode depressed (HCC) 07/04/2015  . Overdose 07/02/2015  . Lactic acidosis 07/02/2015  . Acute respiratory failure (HCC) 07/02/2015  . Sepsis (HCC) 07/02/2015  . AKI (acute kidney injury) (HCC)   . Altered mental status   . Pyrexia   . Abdominal  pain, epigastric 06/17/2015  . Hepatitis C antibody test positive 06/17/2015  . Peptic ulcer disease 06/17/2015  . Epigastric pain   . Transaminitis 06/16/2015  . History of substance abuse 06/16/2015    Past Surgical History:  Procedure Laterality Date  . CESAREAN SECTION    . OTHER SURGICAL HISTORY     scar tissue removed from right ovary   . OTHER SURGICAL HISTORY     fallopian tube repair  . WISDOM TOOTH EXTRACTION       OB History    Gravida  2   Para      Term      Preterm      AB  1   Living  1     SAB      TAB      Ectopic      Multiple      Live Births               Home Medications    Prior to Admission medications   Medication Sig Start Date End Date Taking? Authorizing Provider  carbamazepine (TEGRETOL) 200 MG tablet Take 1 tablet (200 mg total) by mouth 3 (three) times daily. For mood stabilization 12/08/17   Nwoko, Nicole KindredAgnes I, NP  cephALEXin (KEFLEX) 500 MG capsule Take 1 capsule (500 mg total) by mouth every 8 (eight) hours. For infection 12/08/17   Armandina StammerNwoko, Agnes I, NP  FLUoxetine (PROZAC) 40 MG capsule Take 1 capsule (40 mg total) by mouth daily. For depression 12/09/17   Armandina StammerNwoko, Agnes I, NP  gabapentin (NEURONTIN) 300 MG capsule Take 2  capsules (600 mg total) by mouth 3 (three) times daily. Agitation 12/08/17   Armandina Stammer I, NP  hydrOXYzine (ATARAX/VISTARIL) 50 MG tablet Take 1 tablet (50 mg total) by mouth 3 (three) times daily as needed (for anxiety). 12/08/17   Armandina Stammer I, NP  Melatonin 3 MG TABS Take 1 tablet (3 mg total) by mouth daily at 6 PM. For sleep 12/08/17   Armandina Stammer I, NP  nicotine polacrilex (NICORETTE) 2 MG gum Take 1 each (2 mg total) by mouth as needed for smoking cessation. (May buy from over the counter): For smoking cessation 12/08/17   Armandina Stammer I, NP  risperiDONE (RISPERDAL) 3 MG tablet Take 1 tablet (3 mg total) by mouth at bedtime. For mood control 12/08/17   Armandina Stammer I, NP  risperiDONE microspheres (RISPERDAL  CONSTA) 25 MG injection Inject 2 mLs (25 mg total) into the muscle every 14 (fourteen) days. (Due on 12-20-17): For mood control 12/20/17   Armandina Stammer I, NP  traZODone (DESYREL) 50 MG tablet Take 1 tablet (50 mg total) by mouth at bedtime as needed for sleep. 12/08/17   Sanjuana Kava, NP    Family History Family History  Problem Relation Age of Onset  . Drug abuse Brother   . Suicidality Brother   . Cancer Other   . Depression Mother   . Drug abuse Mother   . Alcoholism Father   . Bipolar disorder Cousin     Social History Social History   Tobacco Use  . Smoking status: Current Every Day Smoker    Packs/day: 1.00    Years: 10.00    Pack years: 10.00    Types: Cigarettes  . Smokeless tobacco: Never Used  Substance Use Topics  . Alcohol use: Yes    Comment: occasional use  . Drug use: Yes    Types: Marijuana, Methamphetamines, MDMA (Ecstacy), Oxycodone, Benzodiazepines, Cocaine, IV    Comment: uses heroin, crack and marijuana,  last used 10/04/2017     Allergies   Azithromycin; Nsaids; and Tolmetin   Review of Systems Review of Systems  All other systems reviewed and are negative.    Physical Exam Updated Vital Signs BP (!) 134/93 (BP Location: Right Arm)   Pulse (!) 120   Temp 98.3 F (36.8 C) (Oral)   Resp 20   Ht 1.575 m (5\' 2" )   Wt 79.8 kg (176 lb)   SpO2 96%   BMI 32.19 kg/m   Physical Exam  Constitutional: She is oriented to person, place, and time. She appears well-developed and well-nourished.  HENT:  Head: Normocephalic and atraumatic.  Right Ear: External ear normal.  Left Ear: External ear normal.  Mouth/Throat: Oropharynx is clear and moist.  Eyes: Pupils are equal, round, and reactive to light. EOM are normal.  Neck: Normal range of motion.  Cardiovascular: Tachycardia present.  Pulmonary/Chest: Effort normal and breath sounds normal.  Abdominal: Soft. Bowel sounds are normal.  Musculoskeletal: Normal range of motion.  Neurological:  She is alert and oriented to person, place, and time.  Skin: Skin is warm and dry. Capillary refill takes less than 2 seconds.  Psychiatric: She has a normal mood and affect.  Nursing note and vitals reviewed.    ED Treatments / Results  Labs (all labs ordered are listed, but only abnormal results are displayed) Labs Reviewed  RAPID URINE DRUG SCREEN, HOSP PERFORMED - Abnormal; Notable for the following components:      Result Value   Opiates POSITIVE (*)  Cocaine POSITIVE (*)    Benzodiazepines POSITIVE (*)    Amphetamines POSITIVE (*)    Tetrahydrocannabinol POSITIVE (*)    All other components within normal limits  COMPREHENSIVE METABOLIC PANEL  ETHANOL  CBC WITH DIFFERENTIAL/PLATELET  I-STAT BETA HCG BLOOD, ED ([facility], [facility], [facility] ONLY)    EKG EKG Interpretation  Date/Time:  Thursday December 14 2017 14:51:24 EDT Ventricular Rate:  98 PR Interval:  130 QRS Duration: 92 QT Interval:  346 QTC Calculation: 441 R Axis:   83 Text Interpretation:  Normal sinus rhythm T wave abnormality, consider inferolateral ischemia Abnormal ECG No significant change since last tracing Confirmed by Margarita Grizzle [phone]) on 12/14/2017 3:00:13 PM   Radiology No results found.  Procedures Procedures (including critical care time)  Medications Ordered in ED Medications  sodium chloride 0.9 % bolus 1,000 mL (1,000 mLs Intravenous New Bag/Given 12/14/17 1524)    Followed by  sodium chloride 0.9 % bolus 1,000 mL (has no administration in time range)    Followed by  0.9 %  sodium chloride infusion (has no administration in time range)     Initial Impression / Assessment and Plan / ED Course  I have reviewed the triage vital signs and the nursing notes.  Pertinent labs & imaging results that were available during my care of the patient were reviewed by me and considered in my medical decision making (see chart for details).     Patient with polysubstance abuse and tachycardia.  Patient given  fluids here and taking po well. May be discharged if labs normal. Patient now states she will be suicidal if discharged and needs to be sent somewhere for her substance abuse. Patient medically cleared with hr normalized, electrolytes normal, chronically elevated lfts. Will place psych hold orders Final Clinical Impressions(s) / ED Diagnoses   Final diagnoses:  Nausea and vomiting, intractability of vomiting not specified, unspecified vomiting type  Polysubstance abuse [facility])    ED Discharge Orders    None       Margarita Grizzle, MD 12/14/17 1630
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
## 2017-12-15 NOTE — ED Notes (Signed)
TTS completed. Pt quiet at this time. Allowed 5 minutes to use the phone

## 2017-12-15 NOTE — BHH Counselor (Signed)
Clinician checked in with pt's nurse Bronson Ing(Yvette, RN) and noted the pt is still sleep. Clinician asked Bronson IngYvette, RN to contact TTS once the pt is roused/alert so her TTS assessment will be completed.  Redmond Pullingreylese D Myangel Summons, MS, [facility], [facility] Triage Specialist [phone]

## 2017-12-15 NOTE — Progress Notes (Addendum)
Patient is seen by me via tele-psych and I have consulted with Dr. Jama Flavorsobos.  Patient has been changing her story and reports to some that she is not suicidal and then reports to others that she is suicidal.  Patient has a long history of frequent ED visits.  Patient has not stopped abusing illicit substances.  [facility]  Patient's UDS was positive for amphetamines, cocaine, opiates, cannabis, and benzodiazepines.  Patient reports that someone is out there trying to kill her and when asked how she knew that she stated that she heard them talking to her through a wall.  Patient states that it is agreeable that the substances that she abuses could potentially make her feel paranoid and cause her to have the voices.  Patient is very manipulative and trying to stay in the hospital to allow her place to stay and to go to medications for detox.  Patient denies any SI/HI/AVH but then states that she is suicidal and that [facility] or into a rehab facility but then states that she knows that she cannot say that she is suicidal and get into a rehab facility.  Patient does not meet inpatient criteria and is psychiatrically cleared.  Patient needs to follow-up with her outpatient resources which [facility].  I have contacted Dr. Juleen ChinaKohut notified him of our recommendations and he is in agreement.   Marland Kitchen..Agree with NP Progress Note

## 2018-09-14 DEATH — deceased
